# Patient Record
Sex: Male | Born: 1935 | Race: White | Hispanic: No | Marital: Married | Smoking: Former smoker
Health system: Southern US, Community
[De-identification: ages and names within clinical notes are randomized; demographics above are authoritative.]

## PROBLEM LIST (undated history)

## (undated) DIAGNOSIS — H539 Unspecified visual disturbance: Secondary | ICD-10-CM

## (undated) DIAGNOSIS — D75839 Thrombocytosis, unspecified: Secondary | ICD-10-CM

## (undated) DIAGNOSIS — D473 Essential (hemorrhagic) thrombocythemia: Secondary | ICD-10-CM

## (undated) DIAGNOSIS — E785 Hyperlipidemia, unspecified: Secondary | ICD-10-CM

## (undated) DIAGNOSIS — G629 Polyneuropathy, unspecified: Secondary | ICD-10-CM

## (undated) DIAGNOSIS — E559 Vitamin D deficiency, unspecified: Secondary | ICD-10-CM

## (undated) DIAGNOSIS — R7303 Prediabetes: Secondary | ICD-10-CM

## (undated) DIAGNOSIS — K219 Gastro-esophageal reflux disease without esophagitis: Secondary | ICD-10-CM

## (undated) DIAGNOSIS — E079 Disorder of thyroid, unspecified: Secondary | ICD-10-CM

## (undated) DIAGNOSIS — R55 Syncope and collapse: Secondary | ICD-10-CM

## (undated) DIAGNOSIS — I10 Essential (primary) hypertension: Secondary | ICD-10-CM

## (undated) HISTORY — DX: Gastro-esophageal reflux disease without esophagitis: K21.9

## (undated) HISTORY — DX: Essential (primary) hypertension: I10

## (undated) HISTORY — DX: Thrombocytosis, unspecified: D75.839

## (undated) HISTORY — DX: Disorder of thyroid, unspecified: E07.9

## (undated) HISTORY — PX: NO PAST SURGERIES: SHX2092

## (undated) HISTORY — DX: Prediabetes: R73.03

## (undated) HISTORY — DX: Syncope and collapse: R55

## (undated) HISTORY — DX: Hyperlipidemia, unspecified: E78.5

## (undated) HISTORY — DX: Unspecified visual disturbance: H53.9

## (undated) HISTORY — DX: Vitamin D deficiency, unspecified: E55.9

## (undated) HISTORY — DX: Essential (hemorrhagic) thrombocythemia: D47.3

## (undated) HISTORY — DX: Polyneuropathy, unspecified: G62.9

---

## 1998-04-14 ENCOUNTER — Other Ambulatory Visit: Admission: RE | Admit: 1998-04-14 | Discharge: 1998-04-14 | Payer: Self-pay | Admitting: Internal Medicine

## 2002-09-07 ENCOUNTER — Encounter (INDEPENDENT_AMBULATORY_CARE_PROVIDER_SITE_OTHER): Payer: Self-pay | Admitting: *Deleted

## 2002-09-07 ENCOUNTER — Ambulatory Visit (HOSPITAL_BASED_OUTPATIENT_CLINIC_OR_DEPARTMENT_OTHER): Admission: RE | Admit: 2002-09-07 | Discharge: 2002-09-07 | Payer: Self-pay | Admitting: General Surgery

## 2004-08-10 ENCOUNTER — Ambulatory Visit: Payer: Self-pay | Admitting: Oncology

## 2004-10-05 ENCOUNTER — Ambulatory Visit: Payer: Self-pay | Admitting: Oncology

## 2004-12-06 ENCOUNTER — Ambulatory Visit: Payer: Self-pay | Admitting: Oncology

## 2005-01-31 ENCOUNTER — Ambulatory Visit: Payer: Self-pay | Admitting: Oncology

## 2005-04-04 ENCOUNTER — Ambulatory Visit: Payer: Self-pay | Admitting: Oncology

## 2005-06-06 ENCOUNTER — Ambulatory Visit: Payer: Self-pay | Admitting: Oncology

## 2005-08-06 ENCOUNTER — Ambulatory Visit: Payer: Self-pay | Admitting: Oncology

## 2005-10-04 ENCOUNTER — Ambulatory Visit: Payer: Self-pay | Admitting: Oncology

## 2005-11-22 ENCOUNTER — Ambulatory Visit: Payer: Self-pay | Admitting: Oncology

## 2006-01-17 ENCOUNTER — Ambulatory Visit: Payer: Self-pay | Admitting: Oncology

## 2006-01-20 LAB — CBC WITH DIFFERENTIAL/PLATELET
Basophils Absolute: 0 10*3/uL (ref 0.0–0.1)
Eosinophils Absolute: 0 10*3/uL (ref 0.0–0.5)
HCT: 33.5 % — ABNORMAL LOW (ref 38.7–49.9)
HGB: 11.4 g/dL — ABNORMAL LOW (ref 13.0–17.1)
MCH: 38.2 pg — ABNORMAL HIGH (ref 28.0–33.4)
MCV: 112.3 fL — ABNORMAL HIGH (ref 81.6–98.0)
MONO%: 9.1 % (ref 0.0–13.0)
NEUT#: 3.2 10*3/uL (ref 1.5–6.5)
NEUT%: 71.6 % (ref 40.0–75.0)
RDW: 24.3 % — ABNORMAL HIGH (ref 11.2–14.6)
lymph#: 0.8 10*3/uL — ABNORMAL LOW (ref 0.9–3.3)

## 2006-03-18 ENCOUNTER — Ambulatory Visit: Payer: Self-pay | Admitting: Oncology

## 2006-03-24 LAB — CBC WITH DIFFERENTIAL/PLATELET
Basophils Absolute: 0 10*3/uL (ref 0.0–0.1)
EOS%: 0.9 % (ref 0.0–7.0)
MCHC: 33.9 g/dL (ref 32.0–35.9)
MCV: 112.3 fL — ABNORMAL HIGH (ref 81.6–98.0)
MONO#: 0.4 10*3/uL (ref 0.1–0.9)
NEUT%: 72.8 % (ref 40.0–75.0)
Platelets: 408 10*3/uL — ABNORMAL HIGH (ref 145–400)
RBC: 3.07 10*6/uL — ABNORMAL LOW (ref 4.20–5.71)
RDW: 24.5 % — ABNORMAL HIGH (ref 11.2–14.6)
lymph#: 0.8 10*3/uL — ABNORMAL LOW (ref 0.9–3.3)

## 2006-05-27 ENCOUNTER — Ambulatory Visit: Payer: Self-pay | Admitting: Oncology

## 2006-05-29 LAB — COMPREHENSIVE METABOLIC PANEL
ALT: 16 U/L (ref 0–40)
AST: 17 U/L (ref 0–37)
Alkaline Phosphatase: 46 U/L (ref 39–117)
Sodium: 138 mEq/L (ref 135–145)
Total Bilirubin: 0.8 mg/dL (ref 0.3–1.2)
Total Protein: 6.8 g/dL (ref 6.0–8.3)

## 2006-05-29 LAB — CBC WITH DIFFERENTIAL/PLATELET
BASO%: 0.4 % (ref 0.0–2.0)
Eosinophils Absolute: 0 10*3/uL (ref 0.0–0.5)
HCT: 33.6 % — ABNORMAL LOW (ref 38.7–49.9)
MCHC: 33.8 g/dL (ref 32.0–35.9)
MONO#: 0.4 10*3/uL (ref 0.1–0.9)
NEUT#: 3.5 10*3/uL (ref 1.5–6.5)
NEUT%: 74.8 % (ref 40.0–75.0)
WBC: 4.7 10*3/uL (ref 4.0–10.0)
lymph#: 0.7 10*3/uL — ABNORMAL LOW (ref 0.9–3.3)

## 2006-09-09 ENCOUNTER — Ambulatory Visit: Payer: Self-pay | Admitting: Oncology

## 2006-09-11 LAB — CBC WITH DIFFERENTIAL/PLATELET
BASO%: 0.2 % (ref 0.0–2.0)
Eosinophils Absolute: 0 10*3/uL (ref 0.0–0.5)
LYMPH%: 13 % — ABNORMAL LOW (ref 14.0–48.0)
MCHC: 33.6 g/dL (ref 32.0–35.9)
MONO#: 0.3 10*3/uL (ref 0.1–0.9)
NEUT#: 3.9 10*3/uL (ref 1.5–6.5)
Platelets: 447 10*3/uL — ABNORMAL HIGH (ref 145–400)
RBC: 3.01 10*6/uL — ABNORMAL LOW (ref 4.20–5.71)
RDW: 25.6 % — ABNORMAL HIGH (ref 11.2–14.6)
WBC: 4.9 10*3/uL (ref 4.0–10.0)
lymph#: 0.6 10*3/uL — ABNORMAL LOW (ref 0.9–3.3)

## 2006-12-08 ENCOUNTER — Ambulatory Visit: Payer: Self-pay | Admitting: Oncology

## 2006-12-11 LAB — COMPREHENSIVE METABOLIC PANEL
Albumin: 4.1 g/dL (ref 3.5–5.2)
Alkaline Phosphatase: 49 U/L (ref 39–117)
BUN: 24 mg/dL — ABNORMAL HIGH (ref 6–23)
Creatinine, Ser: 1.22 mg/dL (ref 0.40–1.50)
Glucose, Bld: 92 mg/dL (ref 70–99)
Total Bilirubin: 0.7 mg/dL (ref 0.3–1.2)

## 2006-12-11 LAB — CBC WITH DIFFERENTIAL/PLATELET
Basophils Absolute: 0 10*3/uL (ref 0.0–0.1)
EOS%: 0.8 % (ref 0.0–7.0)
Eosinophils Absolute: 0 10*3/uL (ref 0.0–0.5)
HGB: 11.4 g/dL — ABNORMAL LOW (ref 13.0–17.1)
LYMPH%: 16.6 % (ref 14.0–48.0)
MCH: 37.6 pg — ABNORMAL HIGH (ref 28.0–33.4)
MCV: 109.3 fL — ABNORMAL HIGH (ref 81.6–98.0)
MONO%: 7.1 % (ref 0.0–13.0)
NEUT#: 3.8 10*3/uL (ref 1.5–6.5)
Platelets: 447 10*3/uL — ABNORMAL HIGH (ref 145–400)

## 2007-03-10 ENCOUNTER — Ambulatory Visit: Payer: Self-pay | Admitting: Oncology

## 2007-03-12 LAB — CBC WITH DIFFERENTIAL/PLATELET
BASO%: 0.2 % (ref 0.0–2.0)
Eosinophils Absolute: 0 10*3/uL (ref 0.0–0.5)
HCT: 34.2 % — ABNORMAL LOW (ref 38.7–49.9)
HGB: 11.6 g/dL — ABNORMAL LOW (ref 13.0–17.1)
MCHC: 33.8 g/dL (ref 32.0–35.9)
MONO#: 0.3 10*3/uL (ref 0.1–0.9)
NEUT#: 3.9 10*3/uL (ref 1.5–6.5)
NEUT%: 80.6 % — ABNORMAL HIGH (ref 40.0–75.0)
WBC: 4.8 10*3/uL (ref 4.0–10.0)
lymph#: 0.5 10*3/uL — ABNORMAL LOW (ref 0.9–3.3)

## 2007-06-09 ENCOUNTER — Ambulatory Visit: Payer: Self-pay | Admitting: Oncology

## 2007-09-08 ENCOUNTER — Ambulatory Visit: Payer: Self-pay | Admitting: Oncology

## 2007-09-10 LAB — CBC WITH DIFFERENTIAL/PLATELET
Basophils Absolute: 0 10*3/uL (ref 0.0–0.1)
HCT: 34.2 % — ABNORMAL LOW (ref 38.7–49.9)
HGB: 11.4 g/dL — ABNORMAL LOW (ref 13.0–17.1)
MCH: 36.7 pg — ABNORMAL HIGH (ref 28.0–33.4)
MONO#: 0.4 10*3/uL (ref 0.1–0.9)
NEUT%: 83.6 % — ABNORMAL HIGH (ref 40.0–75.0)
lymph#: 0.8 10*3/uL — ABNORMAL LOW (ref 0.9–3.3)

## 2007-09-25 LAB — CBC WITH DIFFERENTIAL/PLATELET
Basophils Absolute: 0 10*3/uL (ref 0.0–0.1)
EOS%: 1 % (ref 0.0–7.0)
HCT: 31.8 % — ABNORMAL LOW (ref 38.7–49.9)
HGB: 10.9 g/dL — ABNORMAL LOW (ref 13.0–17.1)
MCH: 37.2 pg — ABNORMAL HIGH (ref 28.0–33.4)
MCV: 108.6 fL — ABNORMAL HIGH (ref 81.6–98.0)
MONO%: 5.8 % (ref 0.0–13.0)
NEUT%: 78 % — ABNORMAL HIGH (ref 40.0–75.0)

## 2007-12-11 ENCOUNTER — Ambulatory Visit: Payer: Self-pay | Admitting: Oncology

## 2007-12-15 LAB — FERRITIN: Ferritin: 35 ng/mL (ref 22–322)

## 2008-02-11 ENCOUNTER — Ambulatory Visit: Payer: Self-pay | Admitting: Oncology

## 2008-04-13 ENCOUNTER — Ambulatory Visit: Payer: Self-pay | Admitting: Oncology

## 2008-04-15 LAB — CBC WITH DIFFERENTIAL/PLATELET
BASO%: 0.3 % (ref 0.0–2.0)
EOS%: 0.9 % (ref 0.0–7.0)
HGB: 11.3 g/dL — ABNORMAL LOW (ref 13.0–17.1)
MCH: 36.7 pg — ABNORMAL HIGH (ref 28.0–33.4)
MCHC: 33.4 g/dL (ref 32.0–35.9)
MCV: 110 fL — ABNORMAL HIGH (ref 81.6–98.0)
MONO%: 7.1 % (ref 0.0–13.0)
RBC: 3.08 10*6/uL — ABNORMAL LOW (ref 4.20–5.71)
RDW: 26.7 % — ABNORMAL HIGH (ref 11.2–14.6)
lymph#: 0.8 10*3/uL — ABNORMAL LOW (ref 0.9–3.3)

## 2008-06-14 ENCOUNTER — Ambulatory Visit: Payer: Self-pay | Admitting: Oncology

## 2008-06-16 LAB — CBC WITH DIFFERENTIAL/PLATELET
BASO%: 0.5 % (ref 0.0–2.0)
Basophils Absolute: 0 10*3/uL (ref 0.0–0.1)
EOS%: 0.9 % (ref 0.0–7.0)
HCT: 34.8 % — ABNORMAL LOW (ref 38.7–49.9)
HGB: 11.8 g/dL — ABNORMAL LOW (ref 13.0–17.1)
MCH: 38 pg — ABNORMAL HIGH (ref 28.0–33.4)
MCHC: 33.7 g/dL (ref 32.0–35.9)
MCV: 112.9 fL — ABNORMAL HIGH (ref 81.6–98.0)
MONO%: 8.1 % (ref 0.0–13.0)
NEUT%: 74.3 % (ref 40.0–75.0)
lymph#: 0.7 10*3/uL — ABNORMAL LOW (ref 0.9–3.3)

## 2008-08-09 ENCOUNTER — Ambulatory Visit: Payer: Self-pay | Admitting: Oncology

## 2008-08-11 LAB — CBC WITH DIFFERENTIAL/PLATELET
EOS%: 0.7 % (ref 0.0–7.0)
Eosinophils Absolute: 0 10*3/uL (ref 0.0–0.5)
MCV: 113.6 fL — ABNORMAL HIGH (ref 81.6–98.0)
MONO%: 5.7 % (ref 0.0–13.0)
NEUT#: 4 10*3/uL (ref 1.5–6.5)
RBC: 2.75 10*6/uL — ABNORMAL LOW (ref 4.20–5.71)
RDW: 27.8 % — ABNORMAL HIGH (ref 11.2–14.6)
WBC: 4.9 10*3/uL (ref 4.0–10.0)
lymph#: 0.6 10*3/uL — ABNORMAL LOW (ref 0.9–3.3)

## 2008-08-11 LAB — COMPREHENSIVE METABOLIC PANEL
ALT: 19 U/L (ref 0–53)
AST: 16 U/L (ref 0–37)
Albumin: 4.4 g/dL (ref 3.5–5.2)
Alkaline Phosphatase: 52 U/L (ref 39–117)
Calcium: 8.6 mg/dL (ref 8.4–10.5)
Chloride: 103 mEq/L (ref 96–112)
Creatinine, Ser: 1.19 mg/dL (ref 0.40–1.50)
Potassium: 4.5 mEq/L (ref 3.5–5.3)

## 2008-09-20 ENCOUNTER — Ambulatory Visit: Payer: Self-pay | Admitting: Oncology

## 2008-09-22 LAB — CBC WITH DIFFERENTIAL/PLATELET
BASO%: 0.4 % (ref 0.0–2.0)
Basophils Absolute: 0 10*3/uL (ref 0.0–0.1)
EOS%: 0.8 % (ref 0.0–7.0)
MCH: 39.3 pg — ABNORMAL HIGH (ref 28.0–33.4)
MCHC: 34 g/dL (ref 32.0–35.9)
MCV: 115.7 fL — ABNORMAL HIGH (ref 81.6–98.0)
MONO%: 6.6 % (ref 0.0–13.0)
RBC: 2.38 10*6/uL — ABNORMAL LOW (ref 4.20–5.71)
RDW: 28.7 % — ABNORMAL HIGH (ref 11.2–14.6)

## 2008-11-29 ENCOUNTER — Ambulatory Visit: Payer: Self-pay | Admitting: Oncology

## 2008-12-01 LAB — CBC WITH DIFFERENTIAL/PLATELET
EOS%: 0.9 % (ref 0.0–7.0)
HGB: 11.9 g/dL — ABNORMAL LOW (ref 13.0–17.1)
MCH: 37.1 pg — ABNORMAL HIGH (ref 27.2–33.4)
MCV: 111.5 fL — ABNORMAL HIGH (ref 79.3–98.0)
MONO%: 8.1 % (ref 0.0–14.0)
NEUT#: 4 10*3/uL (ref 1.5–6.5)
RBC: 3.22 10*6/uL — ABNORMAL LOW (ref 4.20–5.82)
RDW: 25.2 % — ABNORMAL HIGH (ref 11.0–14.6)
lymph#: 0.7 10*3/uL — ABNORMAL LOW (ref 0.9–3.3)

## 2008-12-01 LAB — COMPREHENSIVE METABOLIC PANEL
ALT: 15 U/L (ref 0–53)
AST: 16 U/L (ref 0–37)
Albumin: 4.3 g/dL (ref 3.5–5.2)
Alkaline Phosphatase: 55 U/L (ref 39–117)
Calcium: 8.7 mg/dL (ref 8.4–10.5)
Chloride: 104 mEq/L (ref 96–112)
Potassium: 4.2 mEq/L (ref 3.5–5.3)
Sodium: 141 mEq/L (ref 135–145)
Total Protein: 6.4 g/dL (ref 6.0–8.3)

## 2009-01-27 ENCOUNTER — Ambulatory Visit: Payer: Self-pay | Admitting: Oncology

## 2009-02-01 LAB — CBC WITH DIFFERENTIAL/PLATELET
BASO%: 0.3 % (ref 0.0–2.0)
Basophils Absolute: 0 10*3/uL (ref 0.0–0.1)
EOS%: 0.8 % (ref 0.0–7.0)
HGB: 12.7 g/dL — ABNORMAL LOW (ref 13.0–17.1)
MCH: 35.4 pg — ABNORMAL HIGH (ref 27.2–33.4)
MCHC: 33.5 g/dL (ref 32.0–36.0)
MCV: 105.6 fL — ABNORMAL HIGH (ref 79.3–98.0)
MONO%: 7.5 % (ref 0.0–14.0)
NEUT%: 79.9 % — ABNORMAL HIGH (ref 39.0–75.0)
RDW: 25.3 % — ABNORMAL HIGH (ref 11.0–14.6)
lymph#: 0.9 10*3/uL (ref 0.9–3.3)

## 2009-03-28 ENCOUNTER — Ambulatory Visit: Payer: Self-pay | Admitting: Oncology

## 2009-03-30 LAB — CBC WITH DIFFERENTIAL/PLATELET
BASO%: 0.4 % (ref 0.0–2.0)
Eosinophils Absolute: 0.1 10*3/uL (ref 0.0–0.5)
LYMPH%: 12.5 % — ABNORMAL LOW (ref 14.0–49.0)
MCH: 35.6 pg — ABNORMAL HIGH (ref 27.2–33.4)
MCHC: 33.6 g/dL (ref 32.0–36.0)
MCV: 105.9 fL — ABNORMAL HIGH (ref 79.3–98.0)
MONO%: 7.2 % (ref 0.0–14.0)
Platelets: 701 10*3/uL — ABNORMAL HIGH (ref 140–400)
RBC: 3.62 10*6/uL — ABNORMAL LOW (ref 4.20–5.82)

## 2009-04-27 ENCOUNTER — Ambulatory Visit: Payer: Self-pay | Admitting: Oncology

## 2009-05-01 LAB — CBC WITH DIFFERENTIAL/PLATELET
Basophils Absolute: 0 10*3/uL (ref 0.0–0.1)
Eosinophils Absolute: 0.1 10*3/uL (ref 0.0–0.5)
HGB: 11.9 g/dL — ABNORMAL LOW (ref 13.0–17.1)
LYMPH%: 12.3 % — ABNORMAL LOW (ref 14.0–49.0)
MCV: 106.7 fL — ABNORMAL HIGH (ref 79.3–98.0)
MONO%: 7.4 % (ref 0.0–14.0)
NEUT#: 4.7 10*3/uL (ref 1.5–6.5)
NEUT%: 78.7 % — ABNORMAL HIGH (ref 39.0–75.0)
Platelets: 609 10*3/uL — ABNORMAL HIGH (ref 140–400)

## 2009-05-25 LAB — CBC WITH DIFFERENTIAL/PLATELET
Basophils Absolute: 0 10*3/uL (ref 0.0–0.1)
Eosinophils Absolute: 0.1 10*3/uL (ref 0.0–0.5)
HCT: 35.6 % — ABNORMAL LOW (ref 38.4–49.9)
HGB: 12.1 g/dL — ABNORMAL LOW (ref 13.0–17.1)
MCV: 106.5 fL — ABNORMAL HIGH (ref 79.3–98.0)
MONO%: 7.9 % (ref 0.0–14.0)
NEUT#: 4.1 10*3/uL (ref 1.5–6.5)
NEUT%: 78.5 % — ABNORMAL HIGH (ref 39.0–75.0)
RDW: 27 % — ABNORMAL HIGH (ref 11.0–14.6)
lymph#: 0.7 10*3/uL — ABNORMAL LOW (ref 0.9–3.3)

## 2009-08-28 ENCOUNTER — Ambulatory Visit: Payer: Self-pay | Admitting: Oncology

## 2009-08-30 LAB — CBC WITH DIFFERENTIAL/PLATELET
BASO%: 0.5 % (ref 0.0–2.0)
Eosinophils Absolute: 0.1 10*3/uL (ref 0.0–0.5)
HCT: 36.5 % — ABNORMAL LOW (ref 38.4–49.9)
HGB: 12 g/dL — ABNORMAL LOW (ref 13.0–17.1)
MCHC: 32.9 g/dL (ref 32.0–36.0)
MONO#: 0.7 10*3/uL (ref 0.1–0.9)
NEUT#: 6.3 10*3/uL (ref 1.5–6.5)
NEUT%: 75.2 % — ABNORMAL HIGH (ref 39.0–75.0)
WBC: 8.4 10*3/uL (ref 4.0–10.3)
lymph#: 1.3 10*3/uL (ref 0.9–3.3)

## 2009-11-17 ENCOUNTER — Ambulatory Visit: Payer: Self-pay | Admitting: Oncology

## 2009-11-21 LAB — COMPREHENSIVE METABOLIC PANEL
ALT: 17 U/L (ref 0–53)
AST: 15 U/L (ref 0–37)
Albumin: 4.2 g/dL (ref 3.5–5.2)
Alkaline Phosphatase: 50 U/L (ref 39–117)
BUN: 18 mg/dL (ref 6–23)
CO2: 26 mEq/L (ref 19–32)
Calcium: 8.7 mg/dL (ref 8.4–10.5)
Chloride: 106 mEq/L (ref 96–112)
Creatinine, Ser: 1.14 mg/dL (ref 0.40–1.50)
Glucose, Bld: 104 mg/dL — ABNORMAL HIGH (ref 70–99)
Potassium: 4.9 mEq/L (ref 3.5–5.3)
Sodium: 140 mEq/L (ref 135–145)
Total Bilirubin: 0.5 mg/dL (ref 0.3–1.2)
Total Protein: 6.7 g/dL (ref 6.0–8.3)

## 2009-11-21 LAB — CBC WITH DIFFERENTIAL/PLATELET
BASO%: 0.7 % (ref 0.0–2.0)
Basophils Absolute: 0 10*3/uL (ref 0.0–0.1)
EOS%: 1.4 % (ref 0.0–7.0)
Eosinophils Absolute: 0.1 10*3/uL (ref 0.0–0.5)
HCT: 37.2 % — ABNORMAL LOW (ref 38.4–49.9)
HGB: 12.3 g/dL — ABNORMAL LOW (ref 13.0–17.1)
LYMPH%: 14.6 % (ref 14.0–49.0)
MCH: 35.9 pg — ABNORMAL HIGH (ref 27.2–33.4)
MCHC: 33 g/dL (ref 32.0–36.0)
MCV: 108.7 fL — ABNORMAL HIGH (ref 79.3–98.0)
MONO#: 0.5 10*3/uL (ref 0.1–0.9)
MONO%: 7.5 % (ref 0.0–14.0)
NEUT#: 4.6 10*3/uL (ref 1.5–6.5)
NEUT%: 75.8 % — ABNORMAL HIGH (ref 39.0–75.0)
Platelets: 651 10*3/uL — ABNORMAL HIGH (ref 140–400)
RBC: 3.42 10*6/uL — ABNORMAL LOW (ref 4.20–5.82)
RDW: 25.4 % — ABNORMAL HIGH (ref 11.0–14.6)
WBC: 6 10*3/uL (ref 4.0–10.3)
lymph#: 0.9 10*3/uL (ref 0.9–3.3)

## 2010-02-20 ENCOUNTER — Ambulatory Visit: Payer: Self-pay | Admitting: Oncology

## 2010-02-23 LAB — CBC WITH DIFFERENTIAL/PLATELET
BASO%: 0.4 % (ref 0.0–2.0)
Basophils Absolute: 0 10*3/uL (ref 0.0–0.1)
EOS%: 0.8 % (ref 0.0–7.0)
Eosinophils Absolute: 0.1 10*3/uL (ref 0.0–0.5)
HCT: 35.5 % — ABNORMAL LOW (ref 38.4–49.9)
HGB: 11.9 g/dL — ABNORMAL LOW (ref 13.0–17.1)
LYMPH%: 14.4 % (ref 14.0–49.0)
MCH: 36 pg — ABNORMAL HIGH (ref 27.2–33.4)
MCHC: 33.6 g/dL (ref 32.0–36.0)
MCV: 107.2 fL — ABNORMAL HIGH (ref 79.3–98.0)
MONO#: 0.5 10*3/uL (ref 0.1–0.9)
MONO%: 7.9 % (ref 0.0–14.0)
NEUT#: 4.9 10*3/uL (ref 1.5–6.5)
NEUT%: 76.5 % — ABNORMAL HIGH (ref 39.0–75.0)
Platelets: 546 10*3/uL — ABNORMAL HIGH (ref 140–400)
RBC: 3.31 10*6/uL — ABNORMAL LOW (ref 4.20–5.82)
RDW: 25.3 % — ABNORMAL HIGH (ref 11.0–14.6)
WBC: 6.4 10*3/uL (ref 4.0–10.3)
lymph#: 0.9 10*3/uL (ref 0.9–3.3)

## 2010-05-22 ENCOUNTER — Ambulatory Visit: Payer: Self-pay | Admitting: Oncology

## 2010-05-24 LAB — CBC WITH DIFFERENTIAL/PLATELET
BASO%: 0.4 % (ref 0.0–2.0)
Basophils Absolute: 0 10*3/uL (ref 0.0–0.1)
EOS%: 0.9 % (ref 0.0–7.0)
Eosinophils Absolute: 0 10*3/uL (ref 0.0–0.5)
HCT: 36.2 % — ABNORMAL LOW (ref 38.4–49.9)
HGB: 11.7 g/dL — ABNORMAL LOW (ref 13.0–17.1)
LYMPH%: 10.3 % — ABNORMAL LOW (ref 14.0–49.0)
MCH: 35 pg — ABNORMAL HIGH (ref 27.2–33.4)
MCHC: 32.4 g/dL (ref 32.0–36.0)
MCV: 108 fL — ABNORMAL HIGH (ref 79.3–98.0)
MONO#: 0.4 10*3/uL (ref 0.1–0.9)
MONO%: 6.6 % (ref 0.0–14.0)
NEUT#: 4.6 10*3/uL (ref 1.5–6.5)
NEUT%: 81.8 % — ABNORMAL HIGH (ref 39.0–75.0)
Platelets: 626 10*3/uL — ABNORMAL HIGH (ref 140–400)
RBC: 3.35 10*6/uL — ABNORMAL LOW (ref 4.20–5.82)
RDW: 27.1 % — ABNORMAL HIGH (ref 11.0–14.6)
WBC: 5.7 10*3/uL (ref 4.0–10.3)
lymph#: 0.6 10*3/uL — ABNORMAL LOW (ref 0.9–3.3)

## 2010-08-21 ENCOUNTER — Ambulatory Visit: Payer: Self-pay | Admitting: Oncology

## 2010-08-23 LAB — BASIC METABOLIC PANEL
BUN: 17 mg/dL (ref 6–23)
CO2: 26 mEq/L (ref 19–32)
Calcium: 8.8 mg/dL (ref 8.4–10.5)
Chloride: 104 mEq/L (ref 96–112)
Creatinine, Ser: 1.07 mg/dL (ref 0.40–1.50)
Glucose, Bld: 87 mg/dL (ref 70–99)
Potassium: 4.7 mEq/L (ref 3.5–5.3)
Sodium: 137 mEq/L (ref 135–145)

## 2010-08-23 LAB — CBC WITH DIFFERENTIAL/PLATELET
BASO%: 0.3 % (ref 0.0–2.0)
Basophils Absolute: 0 10*3/uL (ref 0.0–0.1)
EOS%: 1.4 % (ref 0.0–7.0)
Eosinophils Absolute: 0.1 10*3/uL (ref 0.0–0.5)
HCT: 34.2 % — ABNORMAL LOW (ref 38.4–49.9)
HGB: 11.6 g/dL — ABNORMAL LOW (ref 13.0–17.1)
LYMPH%: 12.6 % — ABNORMAL LOW (ref 14.0–49.0)
MCH: 35.7 pg — ABNORMAL HIGH (ref 27.2–33.4)
MCHC: 33.8 g/dL (ref 32.0–36.0)
MCV: 105.7 fL — ABNORMAL HIGH (ref 79.3–98.0)
MONO#: 0.5 10*3/uL (ref 0.1–0.9)
MONO%: 7.2 % (ref 0.0–14.0)
NEUT#: 5.1 10*3/uL (ref 1.5–6.5)
NEUT%: 78.5 % — ABNORMAL HIGH (ref 39.0–75.0)
Platelets: 708 10*3/uL — ABNORMAL HIGH (ref 140–400)
RBC: 3.24 10*6/uL — ABNORMAL LOW (ref 4.20–5.82)
RDW: 27.4 % — ABNORMAL HIGH (ref 11.0–14.6)
WBC: 6.5 10*3/uL (ref 4.0–10.3)
lymph#: 0.8 10*3/uL — ABNORMAL LOW (ref 0.9–3.3)

## 2010-10-02 ENCOUNTER — Ambulatory Visit: Payer: Self-pay | Admitting: Oncology

## 2010-10-04 LAB — CBC WITH DIFFERENTIAL/PLATELET
BASO%: 0.4 % (ref 0.0–2.0)
Basophils Absolute: 0 10*3/uL (ref 0.0–0.1)
EOS%: 1 % (ref 0.0–7.0)
Eosinophils Absolute: 0 10*3/uL (ref 0.0–0.5)
HCT: 35.4 % — ABNORMAL LOW (ref 38.4–49.9)
HGB: 11.7 g/dL — ABNORMAL LOW (ref 13.0–17.1)
LYMPH%: 9.5 % — ABNORMAL LOW (ref 14.0–49.0)
MCH: 35.3 pg — ABNORMAL HIGH (ref 27.2–33.4)
MCHC: 33.2 g/dL (ref 32.0–36.0)
MCV: 106.6 fL — ABNORMAL HIGH (ref 79.3–98.0)
MONO#: 0.4 10*3/uL (ref 0.1–0.9)
MONO%: 7.2 % (ref 0.0–14.0)
NEUT#: 4.1 10*3/uL (ref 1.5–6.5)
NEUT%: 81.9 % — ABNORMAL HIGH (ref 39.0–75.0)
Platelets: 556 10*3/uL — ABNORMAL HIGH (ref 140–400)
RBC: 3.32 10*6/uL — ABNORMAL LOW (ref 4.20–5.82)
RDW: 27.5 % — ABNORMAL HIGH (ref 11.0–14.6)
WBC: 5 10*3/uL (ref 4.0–10.3)
lymph#: 0.5 10*3/uL — ABNORMAL LOW (ref 0.9–3.3)

## 2010-11-22 ENCOUNTER — Encounter (HOSPITAL_BASED_OUTPATIENT_CLINIC_OR_DEPARTMENT_OTHER): Payer: Medicare Other | Admitting: Oncology

## 2010-11-22 DIAGNOSIS — D473 Essential (hemorrhagic) thrombocythemia: Secondary | ICD-10-CM

## 2010-11-22 DIAGNOSIS — D539 Nutritional anemia, unspecified: Secondary | ICD-10-CM

## 2011-02-08 NOTE — Op Note (Signed)
NAME:  Jack Schultz, Jack Schultz                       ACCOUNT NO.:  [id number]   MEDICAL RECORD NO.:  [id number]                   PATIENT TYPE:  AMB   LOCATION:  [facility]SC                                  FACILITY:  [facility]   PHYSICIAN:  Adolph Pollack, M.D.            DATE OF BIRTH:  [birth year]   DATE OF PROCEDURE:  09/07/2002  DATE OF DISCHARGE:                                 OPERATIVE REPORT   PREOPERATIVE DIAGNOSIS:  Cystic soft tissue mass, left shoulder measuring 4  cm.   POSTOPERATIVE DIAGNOSIS:  Cystic soft tissue mass, left shoulder measuring 4  cm.   OPERATION PERFORMED:  Excision of cystic soft tissue mass, left shoulder.   SURGEON:  Adolph Pollack, M.D.   ANESTHESIA:  1% plain lidocaine.   INDICATIONS FOR PROCEDURE:  The patient is a 77 year old male who has noted  a slowly growing lesion in the left shoulder area.  He has not had pain from  it until recently.  He now presents for excision.  The procedure and the  risks were explained to him including but not limited to bleeding, infection  or nerve damage.   DESCRIPTION OF PROCEDURE:  The patient was brought to the minor procedure  room, placed supine on the operating table.  The left shoulder mass was  marked and sterilely prepped and draped.  Local anesthetic was infiltrated  directly over it and a transverse incision made directly over the mass and  carried down through the subcutaneous tissue.  The mass appeared to be  cystic.  Using gentle blunt and sharp dissection, I was able to isolate most  of the mass but it appeared to be fairly adherent to bone, potentially like  a slightly inflamed bursa.  I opened up the cystic lesion and evacuated some  of the fluid and excised most of the cyst but leaving the posterior wall on  the periosteum.  I then sent this to pathology.   We had some oozing as he had been on aspirin but off for five days but still  appeared to be having a little bit more bleeding than normal.  I  controlled  this with interrupted absorbable sutures and slight cautery.  Once the  hemostasis was adequate, I then closed the wound with running 3-0 Monocryl  subcuticular stitch.  Steri-Strips and sterile dressings were applied.   The patient tolerated the procedure well without any complications.  He was  given discharge instructions and was sent home in satisfactory condition.  He was given Darvocet for pain.  I will see him back in the office in two or  three weeks for follow-up.                                               Adolph Pollack, M.D.  TJR/MEDQ  D:  09/07/2002  T:  09/07/2002  Job:  [id number]

## 2011-02-22 ENCOUNTER — Other Ambulatory Visit: Payer: Self-pay | Admitting: Oncology

## 2011-02-22 ENCOUNTER — Encounter (HOSPITAL_BASED_OUTPATIENT_CLINIC_OR_DEPARTMENT_OTHER): Payer: Medicare Other | Admitting: Oncology

## 2011-02-22 DIAGNOSIS — D473 Essential (hemorrhagic) thrombocythemia: Secondary | ICD-10-CM

## 2011-02-22 DIAGNOSIS — R252 Cramp and spasm: Secondary | ICD-10-CM

## 2011-02-22 DIAGNOSIS — D539 Nutritional anemia, unspecified: Secondary | ICD-10-CM

## 2011-02-22 LAB — CBC WITH DIFFERENTIAL/PLATELET
BASO%: 0.3 % (ref 0.0–2.0)
Basophils Absolute: 0 10*3/uL (ref 0.0–0.1)
EOS%: 1 % (ref 0.0–7.0)
Eosinophils Absolute: 0.1 10*3/uL (ref 0.0–0.5)
HCT: 34.5 % — ABNORMAL LOW (ref 38.4–49.9)
HGB: 11.5 g/dL — ABNORMAL LOW (ref 13.0–17.1)
LYMPH%: 14.2 % (ref 14.0–49.0)
MCH: 35.7 pg — ABNORMAL HIGH (ref 27.2–33.4)
MCHC: 33.4 g/dL (ref 32.0–36.0)
MCV: 106.8 fL — ABNORMAL HIGH (ref 79.3–98.0)
MONO#: 0.4 10*3/uL (ref 0.1–0.9)
MONO%: 6.3 % (ref 0.0–14.0)
NEUT#: 4.5 10*3/uL (ref 1.5–6.5)
NEUT%: 78.2 % — ABNORMAL HIGH (ref 39.0–75.0)
Platelets: 735 10*3/uL — ABNORMAL HIGH (ref 140–400)
RBC: 3.23 10*6/uL — ABNORMAL LOW (ref 4.20–5.82)
RDW: 27.6 % — ABNORMAL HIGH (ref 11.0–14.6)
WBC: 5.8 10*3/uL (ref 4.0–10.3)
lymph#: 0.8 10*3/uL — ABNORMAL LOW (ref 0.9–3.3)

## 2011-03-22 ENCOUNTER — Other Ambulatory Visit: Payer: Self-pay | Admitting: Oncology

## 2011-03-22 ENCOUNTER — Encounter (HOSPITAL_BASED_OUTPATIENT_CLINIC_OR_DEPARTMENT_OTHER): Payer: Medicare Other | Admitting: Oncology

## 2011-03-22 DIAGNOSIS — R252 Cramp and spasm: Secondary | ICD-10-CM

## 2011-03-22 DIAGNOSIS — D473 Essential (hemorrhagic) thrombocythemia: Secondary | ICD-10-CM

## 2011-03-22 DIAGNOSIS — D539 Nutritional anemia, unspecified: Secondary | ICD-10-CM

## 2011-03-22 LAB — CBC WITH DIFFERENTIAL/PLATELET
BASO%: 0.3 % (ref 0.0–2.0)
EOS%: 1.8 % (ref 0.0–7.0)
HCT: 35.9 % — ABNORMAL LOW (ref 38.4–49.9)
MCH: 34.4 pg — ABNORMAL HIGH (ref 27.2–33.4)
MCHC: 33.1 g/dL (ref 32.0–36.0)
MONO#: 0.5 10*3/uL (ref 0.1–0.9)
RDW: 23.9 % — ABNORMAL HIGH (ref 11.0–14.6)
WBC: 6.3 10*3/uL (ref 4.0–10.3)
lymph#: 0.8 10*3/uL — ABNORMAL LOW (ref 0.9–3.3)
nRBC: 0 % (ref 0–0)

## 2011-04-24 ENCOUNTER — Encounter (HOSPITAL_BASED_OUTPATIENT_CLINIC_OR_DEPARTMENT_OTHER): Payer: Medicare Other | Admitting: Oncology

## 2011-04-24 ENCOUNTER — Other Ambulatory Visit: Payer: Self-pay | Admitting: Oncology

## 2011-04-24 DIAGNOSIS — D539 Nutritional anemia, unspecified: Secondary | ICD-10-CM

## 2011-04-24 DIAGNOSIS — R252 Cramp and spasm: Secondary | ICD-10-CM

## 2011-04-24 DIAGNOSIS — D473 Essential (hemorrhagic) thrombocythemia: Secondary | ICD-10-CM

## 2011-04-24 LAB — CBC WITH DIFFERENTIAL/PLATELET
Basophils Absolute: 0 10*3/uL (ref 0.0–0.1)
Eosinophils Absolute: 0.1 10*3/uL (ref 0.0–0.5)
HGB: 11.5 g/dL — ABNORMAL LOW (ref 13.0–17.1)
MONO#: 0.5 10*3/uL (ref 0.1–0.9)
MONO%: 8.4 % (ref 0.0–14.0)
NEUT#: 4.9 10*3/uL (ref 1.5–6.5)
RBC: 3.38 10*6/uL — ABNORMAL LOW (ref 4.20–5.82)
RDW: 25.2 % — ABNORMAL HIGH (ref 11.0–14.6)
WBC: 6.5 10*3/uL (ref 4.0–10.3)
lymph#: 0.9 10*3/uL (ref 0.9–3.3)
nRBC: 1 % — ABNORMAL HIGH (ref 0–0)

## 2011-05-28 ENCOUNTER — Encounter (HOSPITAL_BASED_OUTPATIENT_CLINIC_OR_DEPARTMENT_OTHER): Payer: Medicare Other | Admitting: Oncology

## 2011-05-28 ENCOUNTER — Other Ambulatory Visit: Payer: Self-pay | Admitting: Oncology

## 2011-05-28 DIAGNOSIS — D539 Nutritional anemia, unspecified: Secondary | ICD-10-CM

## 2011-05-28 DIAGNOSIS — D473 Essential (hemorrhagic) thrombocythemia: Secondary | ICD-10-CM

## 2011-05-28 DIAGNOSIS — R252 Cramp and spasm: Secondary | ICD-10-CM

## 2011-05-28 LAB — CBC WITH DIFFERENTIAL/PLATELET
Basophils Absolute: 0 10*3/uL (ref 0.0–0.1)
EOS%: 1 % (ref 0.0–7.0)
MCH: 34.9 pg — ABNORMAL HIGH (ref 27.2–33.4)
MCV: 105.3 fL — ABNORMAL HIGH (ref 79.3–98.0)
MONO%: 8.3 % (ref 0.0–14.0)
RBC: 3.21 10*6/uL — ABNORMAL LOW (ref 4.20–5.82)
RDW: 24.8 % — ABNORMAL HIGH (ref 11.0–14.6)

## 2011-08-27 ENCOUNTER — Other Ambulatory Visit: Payer: Self-pay | Admitting: Oncology

## 2011-08-27 ENCOUNTER — Other Ambulatory Visit (HOSPITAL_BASED_OUTPATIENT_CLINIC_OR_DEPARTMENT_OTHER): Payer: Medicare Other | Admitting: Lab

## 2011-08-27 DIAGNOSIS — R252 Cramp and spasm: Secondary | ICD-10-CM

## 2011-08-27 DIAGNOSIS — D473 Essential (hemorrhagic) thrombocythemia: Secondary | ICD-10-CM

## 2011-08-27 DIAGNOSIS — D539 Nutritional anemia, unspecified: Secondary | ICD-10-CM

## 2011-08-27 LAB — CBC WITH DIFFERENTIAL/PLATELET
BASO%: 0.4 % (ref 0.0–2.0)
Eosinophils Absolute: 0.1 10*3/uL (ref 0.0–0.5)
MCHC: 33.1 g/dL (ref 32.0–36.0)
MCV: 107.3 fL — ABNORMAL HIGH (ref 79.3–98.0)
MONO%: 6.7 % (ref 0.0–14.0)
NEUT#: 4.1 10*3/uL (ref 1.5–6.5)
RBC: 3.15 10*6/uL — ABNORMAL LOW (ref 4.20–5.82)
RDW: 23.7 % — ABNORMAL HIGH (ref 11.0–14.6)
WBC: 5.2 10*3/uL (ref 4.0–10.3)
nRBC: 0 % (ref 0–0)

## 2011-08-29 ENCOUNTER — Telehealth: Payer: Self-pay | Admitting: *Deleted

## 2011-08-29 NOTE — Telephone Encounter (Signed)
CBC reviewed by Dr. Truett Perna. Pt currently taking Hydrea 1,000mg  MWF and 500mg  all other days. Continue same dose, will recheck CBC at next office visit 11/2010. Pt verbalized understanding.

## 2011-10-09 ENCOUNTER — Telehealth: Payer: Self-pay | Admitting: Oncology

## 2011-10-09 NOTE — Telephone Encounter (Signed)
Daughter is filling out an Event organiser and the question she has to answer is "Has the patient been treated for cancer in the last year?" She said Jack Schultz said what he has is not cancer but can turn into cancer.  If he does have cancer we need to call and tell him that.

## 2011-10-19 ENCOUNTER — Telehealth: Payer: Self-pay | Admitting: Oncology

## 2011-10-19 NOTE — Telephone Encounter (Signed)
pt aware of 12/06/11 appt   aom

## 2011-11-25 ENCOUNTER — Other Ambulatory Visit: Payer: Self-pay | Admitting: Oncology

## 2011-11-25 DIAGNOSIS — D473 Essential (hemorrhagic) thrombocythemia: Secondary | ICD-10-CM

## 2011-12-06 ENCOUNTER — Telehealth: Payer: Self-pay | Admitting: Oncology

## 2011-12-06 ENCOUNTER — Other Ambulatory Visit: Payer: Self-pay | Admitting: *Deleted

## 2011-12-06 ENCOUNTER — Ambulatory Visit (HOSPITAL_BASED_OUTPATIENT_CLINIC_OR_DEPARTMENT_OTHER): Payer: Medicare Other | Admitting: Oncology

## 2011-12-06 ENCOUNTER — Other Ambulatory Visit (HOSPITAL_BASED_OUTPATIENT_CLINIC_OR_DEPARTMENT_OTHER): Payer: Medicare Other | Admitting: Lab

## 2011-12-06 VITALS — BP 102/51 | HR 68 | Temp 97.1°F | Ht 70.0 in | Wt 204.8 lb

## 2011-12-06 DIAGNOSIS — D473 Essential (hemorrhagic) thrombocythemia: Secondary | ICD-10-CM

## 2011-12-06 DIAGNOSIS — D649 Anemia, unspecified: Secondary | ICD-10-CM

## 2011-12-06 DIAGNOSIS — D539 Nutritional anemia, unspecified: Secondary | ICD-10-CM

## 2011-12-06 DIAGNOSIS — R252 Cramp and spasm: Secondary | ICD-10-CM

## 2011-12-06 LAB — CBC WITH DIFFERENTIAL/PLATELET
Eosinophils Absolute: 0.1 10*3/uL (ref 0.0–0.5)
HCT: 30.5 % — ABNORMAL LOW (ref 38.4–49.9)
LYMPH%: 11.9 % — ABNORMAL LOW (ref 14.0–49.0)
MONO#: 0.5 10*3/uL (ref 0.1–0.9)
NEUT#: 4 10*3/uL (ref 1.5–6.5)
Platelets: 590 10*3/uL — ABNORMAL HIGH (ref 140–400)
RBC: 2.82 10*6/uL — ABNORMAL LOW (ref 4.20–5.82)
WBC: 5.2 10*3/uL (ref 4.0–10.3)
nRBC: 1 % — ABNORMAL HIGH (ref 0–0)

## 2011-12-06 MED ORDER — HYDROXYUREA 500 MG PO CAPS: 500.0000 mg | ORAL_CAPSULE | Freq: Every day | ORAL | Status: DC

## 2011-12-06 NOTE — Progress Notes (Signed)
OFFICE PROGRESS NOTE   INTERVAL HISTORY:   He returns as scheduled. He had a "cold "last week. No fever. No sign of venous or arterial thrombosis. No recent nose bleed. He reports mild dyspnea with exertion. He is able to go about his usual activities without difficulty.  Objective:  Vital signs in last 24 hours:  Blood pressure 102/51, pulse 68, temperature 97.1 F (36.2 C), temperature source Oral, height 5\' 10"  (1.778 m), weight 204 lb 12.8 oz (92.897 kg).    HEENT: No thrush or ulcers Resp: Lungs clear bilaterally Cardio: Regular rate and rhythm GI: No hepatosplenomegaly Vascular: The right lower leg is larger than the left side, no edema   Lab Results:  Lab Results  Component Value Date   WBC 5.2 12/06/2011   HGB 10.0* 12/06/2011   HCT 30.5* 12/06/2011   MCV 108.2* 12/06/2011   PLT 590* 12/06/2011   ANC 4.0   Medications: I have reviewed the patient's current medications.  Assessment/Plan: 1. Essential thrombocytosis.  He continues hydroxyurea therapy.  The platelet count is stable. 2.     History of a mild macrocytic anemia.  The hemoglobin has again decreased. We decided to dose reduce the hydroxyurea to 500 mg daily except 1000 mg on Monday and Friday.  Disposition:  The platelet count remains adequately controlled. We decided to decrease the hydroxyurea dose due to the anemia. He will return for a CBC in 6 weeks. We scheduled a 3 month office visit.   Lucile Shutters, MD  12/06/2011  4:32 PM

## 2011-12-06 NOTE — Telephone Encounter (Signed)
Dose changed during office visit today.

## 2011-12-06 NOTE — Telephone Encounter (Signed)
appts made and printed for pt aom °

## 2012-01-17 ENCOUNTER — Other Ambulatory Visit (HOSPITAL_BASED_OUTPATIENT_CLINIC_OR_DEPARTMENT_OTHER): Payer: Medicare Other | Admitting: Lab

## 2012-01-17 DIAGNOSIS — D473 Essential (hemorrhagic) thrombocythemia: Secondary | ICD-10-CM

## 2012-01-17 DIAGNOSIS — D649 Anemia, unspecified: Secondary | ICD-10-CM

## 2012-01-17 LAB — CBC WITH DIFFERENTIAL/PLATELET
Basophils Absolute: 0 10*3/uL (ref 0.0–0.1)
EOS%: 1.2 % (ref 0.0–7.0)
Eosinophils Absolute: 0.1 10*3/uL (ref 0.0–0.5)
HCT: 33 % — ABNORMAL LOW (ref 38.4–49.9)
HGB: 10.4 g/dL — ABNORMAL LOW (ref 13.0–17.1)
MCH: 34.3 pg — ABNORMAL HIGH (ref 27.2–33.4)
MONO#: 0.5 10*3/uL (ref 0.1–0.9)
NEUT#: 4.2 10*3/uL (ref 1.5–6.5)
NEUT%: 74.3 % (ref 39.0–75.0)
lymph#: 0.8 10*3/uL — ABNORMAL LOW (ref 0.9–3.3)

## 2012-01-22 ENCOUNTER — Telehealth: Payer: Self-pay | Admitting: *Deleted

## 2012-01-22 NOTE — Telephone Encounter (Signed)
Message copied by Caleb Popp on Wed Jan 22, 2012  1:24 PM ------      Message from: Ladene Artist      Created: Sun Jan 19, 2012  6:51 PM       Please call patient, same hydroxyurea, f/u as scheduled

## 2012-01-22 NOTE — Telephone Encounter (Signed)
Called pt with lab results, continue same dose of Hydrea. He verbalized understanding. Next appt confirmed.

## 2012-02-21 ENCOUNTER — Telehealth: Payer: Self-pay | Admitting: Oncology

## 2012-02-21 NOTE — Telephone Encounter (Signed)
called pts home s/w wife and provided new appt d/t for 06/14

## 2012-02-28 ENCOUNTER — Ambulatory Visit: Payer: Medicare Other | Admitting: Oncology

## 2012-02-28 ENCOUNTER — Other Ambulatory Visit: Payer: Medicare Other | Admitting: Lab

## 2012-03-06 ENCOUNTER — Telehealth: Payer: Self-pay | Admitting: Oncology

## 2012-03-06 ENCOUNTER — Ambulatory Visit (HOSPITAL_BASED_OUTPATIENT_CLINIC_OR_DEPARTMENT_OTHER): Payer: Medicare Other | Admitting: Oncology

## 2012-03-06 ENCOUNTER — Other Ambulatory Visit (HOSPITAL_BASED_OUTPATIENT_CLINIC_OR_DEPARTMENT_OTHER): Payer: Medicare Other | Admitting: Lab

## 2012-03-06 VITALS — BP 113/59 | HR 65 | Temp 97.7°F | Ht 70.0 in | Wt 198.6 lb

## 2012-03-06 DIAGNOSIS — D473 Essential (hemorrhagic) thrombocythemia: Secondary | ICD-10-CM

## 2012-03-06 LAB — CBC WITH DIFFERENTIAL/PLATELET
BASO%: 0.6 % (ref 0.0–2.0)
Basophils Absolute: 0 10*3/uL (ref 0.0–0.1)
Eosinophils Absolute: 0.1 10*3/uL (ref 0.0–0.5)
HCT: 31.4 % — ABNORMAL LOW (ref 38.4–49.9)
HGB: 10.1 g/dL — ABNORMAL LOW (ref 13.0–17.1)
MONO#: 0.5 10*3/uL (ref 0.1–0.9)
NEUT#: 4.2 10*3/uL (ref 1.5–6.5)
NEUT%: 76.7 % — ABNORMAL HIGH (ref 39.0–75.0)
Platelets: 668 10*3/uL — ABNORMAL HIGH (ref 140–400)
WBC: 5.5 10*3/uL (ref 4.0–10.3)
lymph#: 0.7 10*3/uL — ABNORMAL LOW (ref 0.9–3.3)

## 2012-03-06 NOTE — Telephone Encounter (Signed)
Pt aware of appts and printed   aom

## 2012-03-06 NOTE — Progress Notes (Signed)
   [facility]    OFFICE PROGRESS NOTE   INTERVAL HISTORY:   He returns as scheduled. He continues hydroxyurea. No sign of venous or arterial thrombosis.  To Recinos reports an upper respiratory infection for the past 6 weeks. He developed a cough and left year fullness. He was seen at an urgent care and prescribed antibiotics. He reports the symptoms have improved. No fever or shortness of breath.  Objective:  Vital signs in last 24 hours:  Blood pressure 113/59, pulse 65, temperature 97.7 F (36.5 C), temperature source Oral, height 5\' 10"  (1.778 m), weight 198 lb 9.6 oz (90.084 kg).    HEENT: No thrush or ulcers Resp: Lungs clear bilaterally Cardio: Regular rate and rhythm GI: No hepatosplenomegaly Vascular: No leg edema, right lower leg is slightly larger than the left side   Lab Results:  Lab Results  Component Value Date   WBC 5.5 03/06/2012   HGB 10.1* 03/06/2012   HCT 31.4* 03/06/2012   MCV 108.5* 03/06/2012   PLT 668* 03/06/2012   ANC 4.2    Medications: I have reviewed the patient's current medications.  Assessment/Plan: 1. Essential thrombocytosis. He continues hydroxyurea therapy. The platelet count is stable. 2. History of a mild macrocytic anemia. The hemoglobin has again decreased. We decided to dose reduce the hydroxyurea to 500 mg daily except 1000 mg on Monday and Friday.    Disposition:  He appears stable. The platelet count remains elevated on hydroxyurea. The current elevation of the platelet count could be related to a recent infection.  I discussed options with Mr. Weekly. The hemoglobin has declined with an increased dose of hydroxyurea. We could consider switching to anagrelide, but this would require dosing several times per day and there is an increased cost.  He has no symptoms from the essential thrombocytosis and there has been no vascular events. We decided to continue the current hydroxyurea dose for now. He will  return for a CBC in 2 months. We will consider increasing the hydroxyurea dose slightly or switching to anagrelide if the platelet count remains elevated.   Thornton Papas, MD  03/06/2012  11:05 AM

## 2012-03-30 ENCOUNTER — Encounter: Payer: Self-pay | Admitting: Dietician

## 2012-03-30 NOTE — Progress Notes (Signed)
Brief Out-patient Oncology Nutrition Note  Reason: Patient screened positive for nutrition risk for unintentional weight loss and decreased appetite.   Jack Schultz is an 78 year old male patient of Dr. Truett Perna, diagnosed with essential thrombocytosis. Contacted patient via home telephone for nutrition risk. Patient reported his appetite and intake are very good. He reported his weight is staying stable. He does not have any nutrition questions or concerns.   Wt Readings from Last 10 Encounters:  03/06/12 198 lb 9.6 oz (90.084 kg)  12/06/11 204 lb 12.8 oz (92.897 kg)   RD available for nutrition needs.   Iven Finn [facility] [phone]

## 2012-05-06 ENCOUNTER — Other Ambulatory Visit (HOSPITAL_BASED_OUTPATIENT_CLINIC_OR_DEPARTMENT_OTHER): Payer: Medicare Other | Admitting: Lab

## 2012-05-06 ENCOUNTER — Telehealth: Payer: Self-pay | Admitting: *Deleted

## 2012-05-06 DIAGNOSIS — D473 Essential (hemorrhagic) thrombocythemia: Secondary | ICD-10-CM

## 2012-05-06 LAB — COMPREHENSIVE METABOLIC PANEL
ALT: 12 U/L (ref 0–53)
BUN: 22 mg/dL (ref 6–23)
CO2: 28 mEq/L (ref 19–32)
Calcium: 8.8 mg/dL (ref 8.4–10.5)
Chloride: 106 mEq/L (ref 96–112)
Creatinine, Ser: 1.21 mg/dL (ref 0.50–1.35)
Glucose, Bld: 104 mg/dL — ABNORMAL HIGH (ref 70–99)

## 2012-05-06 LAB — CBC WITH DIFFERENTIAL/PLATELET
BASO%: 0.6 % (ref 0.0–2.0)
Eosinophils Absolute: 0.1 10*3/uL (ref 0.0–0.5)
HCT: 33 % — ABNORMAL LOW (ref 38.4–49.9)
MCHC: 32.1 g/dL (ref 32.0–36.0)
MONO#: 0.3 10*3/uL (ref 0.1–0.9)
NEUT#: 3.5 10*3/uL (ref 1.5–6.5)
NEUT%: 78.9 % — ABNORMAL HIGH (ref 39.0–75.0)
WBC: 4.4 10*3/uL (ref 4.0–10.3)
lymph#: 0.5 10*3/uL — ABNORMAL LOW (ref 0.9–3.3)

## 2012-05-06 NOTE — Telephone Encounter (Signed)
MD review of CBC-stable. Continue Hydrea at 500 mg daily, except 1000 mg on M & F. Recheck lab 10/16

## 2012-05-21 ENCOUNTER — Telehealth: Payer: Self-pay | Admitting: *Deleted

## 2012-05-21 NOTE — Telephone Encounter (Signed)
Called and spoke with pt, per Dr. Truett Perna continue Hydrea 500mg  daily except 1000mg  Monday and Fridays and follow-up as scheduled.  Pt verbalized understanding.

## 2012-05-22 ENCOUNTER — Other Ambulatory Visit: Payer: Self-pay | Admitting: Oncology

## 2012-05-22 DIAGNOSIS — D473 Essential (hemorrhagic) thrombocythemia: Secondary | ICD-10-CM

## 2012-07-08 ENCOUNTER — Other Ambulatory Visit (HOSPITAL_BASED_OUTPATIENT_CLINIC_OR_DEPARTMENT_OTHER): Payer: Medicare Other | Admitting: Lab

## 2012-07-08 DIAGNOSIS — D473 Essential (hemorrhagic) thrombocythemia: Secondary | ICD-10-CM | POA: Insufficient documentation

## 2012-07-08 LAB — CBC WITH DIFFERENTIAL/PLATELET
Basophils Absolute: 0 10*3/uL (ref 0.0–0.1)
HCT: 36.9 % — ABNORMAL LOW (ref 38.4–49.9)
HGB: 11.8 g/dL — ABNORMAL LOW (ref 13.0–17.1)
MCH: 33.1 pg (ref 27.2–33.4)
MONO#: 0.4 10*3/uL (ref 0.1–0.9)
NEUT%: 76.5 % — ABNORMAL HIGH (ref 39.0–75.0)
WBC: 5.1 10*3/uL (ref 4.0–10.3)
lymph#: 0.7 10*3/uL — ABNORMAL LOW (ref 0.9–3.3)

## 2012-07-09 ENCOUNTER — Telehealth: Payer: Self-pay | Admitting: *Deleted

## 2012-07-09 NOTE — Telephone Encounter (Signed)
Patient notified of CBC results. Continue Hydrea at 500 mg daily, except 1000 mg on Monday & Friday. Will recheck in 2 months as scheduled.

## 2012-07-09 NOTE — Telephone Encounter (Signed)
Message copied by Wandalee Ferdinand on Thu Jul 09, 2012  3:16 PM ------      Message from: Thornton Papas B      Created: Thu Jul 09, 2012  7:04 AM       Please call patient, cbc ok, continue same hydrea

## 2012-09-04 ENCOUNTER — Ambulatory Visit (HOSPITAL_BASED_OUTPATIENT_CLINIC_OR_DEPARTMENT_OTHER): Payer: Medicare Other | Admitting: Oncology

## 2012-09-04 ENCOUNTER — Telehealth: Payer: Self-pay | Admitting: Oncology

## 2012-09-04 ENCOUNTER — Other Ambulatory Visit (HOSPITAL_BASED_OUTPATIENT_CLINIC_OR_DEPARTMENT_OTHER): Payer: Medicare Other | Admitting: Lab

## 2012-09-04 VITALS — BP 127/65 | HR 57 | Temp 96.8°F | Resp 18 | Ht 70.0 in | Wt 202.8 lb

## 2012-09-04 DIAGNOSIS — D473 Essential (hemorrhagic) thrombocythemia: Secondary | ICD-10-CM

## 2012-09-04 DIAGNOSIS — D539 Nutritional anemia, unspecified: Secondary | ICD-10-CM

## 2012-09-04 LAB — CBC WITH DIFFERENTIAL/PLATELET
Basophils Absolute: 0 10*3/uL (ref 0.0–0.1)
Eosinophils Absolute: 0 10*3/uL (ref 0.0–0.5)
HGB: 9.9 g/dL — ABNORMAL LOW (ref 13.0–17.1)
LYMPH%: 13.5 % — ABNORMAL LOW (ref 14.0–49.0)
MCV: 104.4 fL — ABNORMAL HIGH (ref 79.3–98.0)
MONO%: 8.4 % (ref 0.0–14.0)
NEUT#: 3.3 10*3/uL (ref 1.5–6.5)
Platelets: 486 10*3/uL — ABNORMAL HIGH (ref 140–400)

## 2012-09-04 NOTE — Progress Notes (Signed)
   [facility]    OFFICE PROGRESS NOTE   INTERVAL HISTORY:   He returns as scheduled. He continues hydroxyurea. He had a significant nosebleed last week. No other bleeding. He reports malaise. No other complaint.  Objective:  Vital signs in last 24 hours:  Blood pressure 127/65, pulse 57, temperature 96.8 F (36 C), temperature source Oral, resp. rate 18, height 5\' 10"  (1.778 m), weight 202 lb 12.8 oz (91.989 kg).    HEENT: No thrush or ulcers Resp: Lungs clear bilaterally Cardio: Regular rate and rhythm GI: No hepatosplenomegaly Vascular: Trace edema at the right lower leg.   Lab Results:  Lab Results  Component Value Date   WBC 4.3 09/04/2012   HGB 9.9* 09/04/2012   HCT 30.7* 09/04/2012   MCV 104.4* 09/04/2012   PLT 486* 09/04/2012   ANC 3.9    Medications: I have reviewed the patient's current medications.  Assessment/Plan: 1. Essential thrombocytosis. He continues hydroxyurea therapy. The platelet count is stable.       2. History of a mild macrocytic anemia. The hemoglobin has again decreased. This may be related to the recent epistaxis .       3.  epistaxis-he reports this occurs once every 3-4 years,? Related to the myeloproliferative disorder and aspirin therapy.   Disposition:  He will continue hydroxyurea at the same dose. He will return for a CBC in 6 weeks to followup on the low hemoglobin. Mr. Whittenberg is scheduled for an office visit in 6 months.   Thornton Papas, MD  09/04/2012  11:26 AM

## 2012-09-04 NOTE — Telephone Encounter (Signed)
appts made and printed for pt by lab orders and per pt,no pof sent    Jack Schultz

## 2012-10-15 ENCOUNTER — Other Ambulatory Visit (HOSPITAL_BASED_OUTPATIENT_CLINIC_OR_DEPARTMENT_OTHER): Payer: Medicare Other

## 2012-10-15 DIAGNOSIS — D473 Essential (hemorrhagic) thrombocythemia: Secondary | ICD-10-CM

## 2012-10-15 LAB — CBC WITH DIFFERENTIAL/PLATELET
BASO%: 0.5 % (ref 0.0–2.0)
EOS%: 1.6 % (ref 0.0–7.0)
MCH: 34.7 pg — ABNORMAL HIGH (ref 27.2–33.4)
MCHC: 32.3 g/dL (ref 32.0–36.0)
MONO#: 0.4 10*3/uL (ref 0.1–0.9)
RDW: 24.8 % — ABNORMAL HIGH (ref 11.0–14.6)
WBC: 5.5 10*3/uL (ref 4.0–10.3)
lymph#: 0.7 10*3/uL — ABNORMAL LOW (ref 0.9–3.3)
nRBC: 1 % — ABNORMAL HIGH (ref 0–0)

## 2012-10-19 ENCOUNTER — Other Ambulatory Visit: Payer: Self-pay | Admitting: Oncology

## 2012-10-21 ENCOUNTER — Telehealth: Payer: Self-pay | Admitting: *Deleted

## 2012-10-21 NOTE — Telephone Encounter (Signed)
Message copied by Wandalee Ferdinand on Wed Oct 21, 2012  4:42 PM ------      Message from: Ladene Artist      Created: Sat Oct 17, 2012  3:28 PM       Please call patient, hb better, cont. Hydrea, f/u lab March as scheduled

## 2012-10-21 NOTE — Telephone Encounter (Signed)
Notified patient of CBC results. Will continue Hydrea at current dose and recheck lab as scheduled in March.

## 2012-11-26 ENCOUNTER — Other Ambulatory Visit (HOSPITAL_BASED_OUTPATIENT_CLINIC_OR_DEPARTMENT_OTHER): Payer: Medicare Other | Admitting: Lab

## 2012-11-26 DIAGNOSIS — D473 Essential (hemorrhagic) thrombocythemia: Secondary | ICD-10-CM

## 2012-11-26 LAB — CBC WITH DIFFERENTIAL/PLATELET
BASO%: 1 % (ref 0.0–2.0)
Basophils Absolute: 0.1 10*3/uL (ref 0.0–0.1)
EOS%: 1.4 % (ref 0.0–7.0)
HCT: 34.1 % — ABNORMAL LOW (ref 38.4–49.9)
LYMPH%: 14 % (ref 14.0–49.0)
MCH: 34.2 pg — ABNORMAL HIGH (ref 27.2–33.4)
MCHC: 32 g/dL (ref 32.0–36.0)
MCV: 106.9 fL — ABNORMAL HIGH (ref 79.3–98.0)
NEUT#: 3.7 10*3/uL (ref 1.5–6.5)
NEUT%: 73 % (ref 39.0–75.0)
Platelets: 510 10*3/uL — ABNORMAL HIGH (ref 140–400)
WBC: 5.1 10*3/uL (ref 4.0–10.3)

## 2012-11-27 ENCOUNTER — Telehealth: Payer: Self-pay | Admitting: *Deleted

## 2012-11-27 NOTE — Telephone Encounter (Signed)
Per Dr. Truett Perna, called pt and gave results of lab, continue same Hydrea.  Pt verbalized understanding and confirmed appt for 02/25/13

## 2012-11-27 NOTE — Telephone Encounter (Signed)
Message copied by Gala Romney on Fri Nov 27, 2012  3:38 PM ------      Message from: Thornton Papas B      Created: Thu Nov 26, 2012  9:56 PM       Please call patient, stable hb and plts, cont. Hydrea-same dose, f/u as scheduled ------

## 2012-12-31 ENCOUNTER — Encounter: Payer: Self-pay | Admitting: Oncology

## 2013-01-16 ENCOUNTER — Other Ambulatory Visit: Payer: Self-pay | Admitting: Oncology

## 2013-01-16 DIAGNOSIS — C50919 Malignant neoplasm of unspecified site of unspecified female breast: Secondary | ICD-10-CM

## 2013-01-16 DIAGNOSIS — C7952 Secondary malignant neoplasm of bone marrow: Secondary | ICD-10-CM

## 2013-01-25 ENCOUNTER — Other Ambulatory Visit: Payer: Self-pay | Admitting: Oncology

## 2013-02-25 ENCOUNTER — Other Ambulatory Visit (HOSPITAL_BASED_OUTPATIENT_CLINIC_OR_DEPARTMENT_OTHER): Payer: Medicare Other | Admitting: Lab

## 2013-02-25 ENCOUNTER — Telehealth: Payer: Self-pay | Admitting: Oncology

## 2013-02-25 ENCOUNTER — Ambulatory Visit (HOSPITAL_BASED_OUTPATIENT_CLINIC_OR_DEPARTMENT_OTHER): Payer: Medicare Other | Admitting: Oncology

## 2013-02-25 VITALS — BP 122/67 | HR 57 | Temp 97.5°F | Resp 18 | Ht 70.0 in | Wt 199.6 lb

## 2013-02-25 DIAGNOSIS — R040 Epistaxis: Secondary | ICD-10-CM

## 2013-02-25 DIAGNOSIS — D473 Essential (hemorrhagic) thrombocythemia: Secondary | ICD-10-CM

## 2013-02-25 DIAGNOSIS — D649 Anemia, unspecified: Secondary | ICD-10-CM

## 2013-02-25 LAB — CBC WITH DIFFERENTIAL/PLATELET
Basophils Absolute: 0 10*3/uL (ref 0.0–0.1)
EOS%: 1 % (ref 0.0–7.0)
Eosinophils Absolute: 0 10*3/uL (ref 0.0–0.5)
LYMPH%: 12.8 % — ABNORMAL LOW (ref 14.0–49.0)
MCH: 33.7 pg — ABNORMAL HIGH (ref 27.2–33.4)
MCV: 105.3 fL — ABNORMAL HIGH (ref 79.3–98.0)
MONO%: 8.2 % (ref 0.0–14.0)
Platelets: 378 10*3/uL (ref 140–400)
RBC: 3.03 10*6/uL — ABNORMAL LOW (ref 4.20–5.82)
RDW: 24 % — ABNORMAL HIGH (ref 11.0–14.6)

## 2013-02-25 NOTE — Progress Notes (Signed)
   [facility]    OFFICE PROGRESS NOTE   INTERVAL HISTORY:   He returns as scheduled. He continues hydroxyurea. No new complaint. No history of venous or arterial thrombosis. He reports a recent episode of discomfort at the left lower leg. He was evaluated by Dr. Oneta Rack and felt to have "back "related pain. Good energy level.  Objective:  Vital signs in last 24 hours:  Blood pressure 122/67, pulse 57, temperature 97.5 F (36.4 C), temperature source Oral, resp. rate 18, height 5\' 10"  (1.778 m), weight 199 lb 9.6 oz (90.538 kg), SpO2 100.00%.    HEENT: No thrush or ulcers Resp: Lungs clear bilaterally Cardio: Regular rate and rhythm GI: No hepatomegaly Vascular: The right lower leg is slightly larger than the left side, no significant edema. No erythema.   Lab Results:  Lab Results  Component Value Date   WBC 3.9* 02/25/2013   HGB 10.2* 02/25/2013   HCT 31.9* 02/25/2013   MCV 105.3* 02/25/2013   PLT 378 02/25/2013   ANC 3.0    Medications: I have reviewed the patient's current medications.  Assessment/Plan: 1. Essential thrombocytosis. He continues hydroxyurea therapy. The platelet count is stable.       2. History of a mild macrocytic anemia. Most likely secondary to hydroxyurea therapy.       3. epistaxis-he reports this occurs once every 3-4 years,? Related to the myeloproliferative disorder and aspirin therapy.   Disposition:  He appears unchanged. He has no documented history of venous or arterial thrombosis. We decided to decrease the hydroxyurea dose to 500 mg daily. Hopefully this will allow for some improvement in the anemia. Mr. Holzhauer were return for a lab visit in 3 months. He is scheduled for a 6 month office visit.   Thornton Papas, MD  02/25/2013  12:55 PM

## 2013-02-25 NOTE — Telephone Encounter (Signed)
gv and printed appt sched and avs for pt  °

## 2013-03-03 ENCOUNTER — Other Ambulatory Visit: Payer: Self-pay | Admitting: Oncology

## 2013-03-03 NOTE — Telephone Encounter (Signed)
Dose changed to 500 mg daily on 02/24/13 visit.

## 2013-05-27 ENCOUNTER — Other Ambulatory Visit (HOSPITAL_BASED_OUTPATIENT_CLINIC_OR_DEPARTMENT_OTHER): Payer: Medicare Other

## 2013-05-27 DIAGNOSIS — D473 Essential (hemorrhagic) thrombocythemia: Secondary | ICD-10-CM

## 2013-05-27 LAB — CBC WITH DIFFERENTIAL/PLATELET
BASO%: 0.4 % (ref 0.0–2.0)
EOS%: 1.3 % (ref 0.0–7.0)
HCT: 34.6 % — ABNORMAL LOW (ref 38.4–49.9)
LYMPH%: 10.3 % — ABNORMAL LOW (ref 14.0–49.0)
MCH: 31.3 pg (ref 27.2–33.4)
MCHC: 31.5 g/dL — ABNORMAL LOW (ref 32.0–36.0)
MCV: 99.4 fL — ABNORMAL HIGH (ref 79.3–98.0)
MONO%: 7.2 % (ref 0.0–14.0)
NEUT%: 80.8 % — ABNORMAL HIGH (ref 39.0–75.0)
Platelets: 409 10*3/uL — ABNORMAL HIGH (ref 140–400)

## 2013-05-28 ENCOUNTER — Telehealth: Payer: Self-pay | Admitting: *Deleted

## 2013-05-28 NOTE — Telephone Encounter (Signed)
Message copied by Caleb Popp on Fri May 28, 2013  5:02 PM ------      Message from: Thornton Papas B      Created: Thu May 27, 2013  9:35 PM       Please call patient, cbc is ok, cont. hydrea ------

## 2013-05-28 NOTE — Telephone Encounter (Signed)
Called pt, CBC Ok, per Dr. Truett Perna. Continue Hydrea. Pt voiced understanding. Currently taking Hydrea 500 mg daily.

## 2013-07-31 ENCOUNTER — Other Ambulatory Visit: Payer: Self-pay | Admitting: Oncology

## 2013-07-31 DIAGNOSIS — D473 Essential (hemorrhagic) thrombocythemia: Secondary | ICD-10-CM

## 2013-08-26 ENCOUNTER — Ambulatory Visit (HOSPITAL_BASED_OUTPATIENT_CLINIC_OR_DEPARTMENT_OTHER): Payer: Medicare Other | Admitting: Oncology

## 2013-08-26 ENCOUNTER — Telehealth: Payer: Self-pay | Admitting: *Deleted

## 2013-08-26 ENCOUNTER — Other Ambulatory Visit (HOSPITAL_BASED_OUTPATIENT_CLINIC_OR_DEPARTMENT_OTHER): Payer: Medicare Other | Admitting: Lab

## 2013-08-26 VITALS — BP 137/61 | HR 96 | Temp 97.8°F | Resp 20 | Ht 70.0 in | Wt 193.3 lb

## 2013-08-26 DIAGNOSIS — D473 Essential (hemorrhagic) thrombocythemia: Secondary | ICD-10-CM

## 2013-08-26 DIAGNOSIS — D649 Anemia, unspecified: Secondary | ICD-10-CM

## 2013-08-26 LAB — CBC WITH DIFFERENTIAL/PLATELET
BASO%: 1.2 % (ref 0.0–2.0)
Eosinophils Absolute: 0.1 10*3/uL (ref 0.0–0.5)
LYMPH%: 4.9 % — ABNORMAL LOW (ref 14.0–49.0)
MCHC: 31.4 g/dL — ABNORMAL LOW (ref 32.0–36.0)
MCV: 101 fL — ABNORMAL HIGH (ref 79.3–98.0)
MONO%: 11.2 % (ref 0.0–14.0)
NEUT%: 81.8 % — ABNORMAL HIGH (ref 39.0–75.0)
Platelets: 438 10*3/uL — ABNORMAL HIGH (ref 140–400)
RBC: 3.12 10*6/uL — ABNORMAL LOW (ref 4.20–5.82)
nRBC: 4 % — ABNORMAL HIGH (ref 0–0)

## 2013-08-26 NOTE — Telephone Encounter (Signed)
appts made and printed...td 

## 2013-08-26 NOTE — Progress Notes (Signed)
   Jack Schultz    OFFICE PROGRESS NOTE   INTERVAL HISTORY:   Jack Schultz returns for scheduled followup of essential thrombocytosis. He continues hydroxyurea. He reports stable exertional dyspnea. He continues to work around his home. He had a severe nosebleed over the Thanksgiving holiday that required packing. He reports having a similar nosebleed once every few years. No other bleeding. He is stool turned dark when he began taking iron.  Objective:  Vital signs in last 24 hours:  Blood pressure 137/61, pulse 96, temperature 97.8 F (36.6 C), temperature source Oral, resp. rate 20, height 5\' 10"  (1.778 m), weight 193 lb 4.8 oz (87.68 kg).    HEENT: No thrush or ulcers. Resp: Lungs clear bilaterally Cardio: Regular rate and rhythm GI: No hepatosplenomegaly, nontender Vascular: The right lower leg is larger than the left side. No erythema.   Lab Results:  Lab Results  Component Value Date   WBC 9.7 08/26/2013   HGB 9.9* 08/26/2013   HCT 31.5* 08/26/2013   MCV 101.0* 08/26/2013   PLT 438* 08/26/2013   ANC 7.9  05/27/2013-hemoglobin   10.9    Medications: I have reviewed the patient's current medications.  Assessment/Plan: 1. Essential thrombocytosis. He continues hydroxyurea therapy. The platelet count is stable. 2. History of a mild macrocytic anemia. Most likely secondary to hydroxyurea therapy. The hemoglobin is lower today,? Related to the recent nosebleed.  3. history of epistaxis-he reports this occurs once every 1-2 years. Related to the myeloproliferative disorder and aspirin therapy?    Disposition:  His overall status appears unchanged. The hemoglobin is lower today. This may be related to the recent nosebleed. He will continue hydroxyurea at current dose. He will contact us for recurrent bleeding. Jack Schultz will return for a CBC in 6 weeks. He is scheduled for a 6 month office visit.   Thornton Papas, MD  08/26/2013  12:46 PM

## 2013-09-13 ENCOUNTER — Encounter: Payer: Self-pay | Admitting: Internal Medicine

## 2013-09-14 ENCOUNTER — Ambulatory Visit (INDEPENDENT_AMBULATORY_CARE_PROVIDER_SITE_OTHER): Payer: Medicare Other | Admitting: Internal Medicine

## 2013-09-14 ENCOUNTER — Encounter: Payer: Self-pay | Admitting: Internal Medicine

## 2013-09-14 VITALS — BP 122/64 | HR 60 | Temp 97.3°F | Resp 16 | Wt 193.8 lb

## 2013-09-14 DIAGNOSIS — E559 Vitamin D deficiency, unspecified: Secondary | ICD-10-CM

## 2013-09-14 DIAGNOSIS — I10 Essential (primary) hypertension: Secondary | ICD-10-CM

## 2013-09-14 DIAGNOSIS — R7303 Prediabetes: Secondary | ICD-10-CM | POA: Insufficient documentation

## 2013-09-14 DIAGNOSIS — Z79899 Other long term (current) drug therapy: Secondary | ICD-10-CM

## 2013-09-14 DIAGNOSIS — E782 Mixed hyperlipidemia: Secondary | ICD-10-CM | POA: Insufficient documentation

## 2013-09-14 DIAGNOSIS — R7309 Other abnormal glucose: Secondary | ICD-10-CM

## 2013-09-14 LAB — CBC WITH DIFFERENTIAL/PLATELET
Basophils Absolute: 0.1 10*3/uL (ref 0.0–0.1)
Basophils Relative: 1 % (ref 0–1)
HCT: 31.5 % — ABNORMAL LOW (ref 39.0–52.0)
Lymphocytes Relative: 12 % (ref 12–46)
Lymphs Abs: 0.9 10*3/uL (ref 0.7–4.0)
MCV: 98.7 fL (ref 78.0–100.0)
Monocytes Absolute: 0.7 10*3/uL (ref 0.1–1.0)
Neutro Abs: 6.1 10*3/uL (ref 1.7–7.7)
Neutrophils Relative %: 77 % (ref 43–77)
Platelets: 389 10*3/uL (ref 150–400)
RBC: 3.19 MIL/uL — ABNORMAL LOW (ref 4.22–5.81)
RDW: 26.2 % — ABNORMAL HIGH (ref 11.5–15.5)
WBC: 7.8 10*3/uL (ref 4.0–10.5)

## 2013-09-14 LAB — BASIC METABOLIC PANEL WITH GFR
CO2: 28 mEq/L (ref 19–32)
Chloride: 104 mEq/L (ref 96–112)
GFR, Est African American: 78 mL/min
GFR, Est Non African American: 67 mL/min
Potassium: 4.8 mEq/L (ref 3.5–5.3)

## 2013-09-14 LAB — HEPATIC FUNCTION PANEL
Albumin: 4.1 g/dL (ref 3.5–5.2)
Alkaline Phosphatase: 57 U/L (ref 39–117)
Indirect Bilirubin: 0.5 mg/dL (ref 0.0–0.9)
Total Bilirubin: 0.6 mg/dL (ref 0.3–1.2)

## 2013-09-14 LAB — LIPID PANEL
Cholesterol: 135 mg/dL (ref 0–200)
HDL: 25 mg/dL — ABNORMAL LOW (ref >39)
LDL Cholesterol: 84 mg/dL (ref 0–99)
Total CHOL/HDL Ratio: 5.4 Ratio
Triglycerides: 129 mg/dL (ref <150)
VLDL: 26 mg/dL (ref 0–40)

## 2013-09-14 NOTE — Patient Instructions (Signed)

## 2013-09-14 NOTE — Progress Notes (Signed)
Patient ID: Jack Schultz, male   DOB: January 04, 1924, 79 y.o.   MRN: [id number]   This very nice 79 y.o.male presents for 3 month follow up with Hypertension, Hyperlipidemia, Thrombocytosis, Pre-Diabetes and Vitamin D Deficiency.    BP has been controlled at home. Today's BP is 122/64. Patient denies any cardiac type chest pain, palpitations, dyspnea/orthopnea/PND, dizziness, claudication, or dependent edema.   Hyperlipidemia is controlled with diet. Last Cholesterol was 125, Triglycerides were 201, HDL 22 and LDL 63 - all at goal.    Also, the patient has history of PreDiabetes since 2010  with last A1c of 6.0% in September.. Patient denies any symptoms of reactive hypoglycemia, diabetic polys, paresthesias or visual blurring.   Further, Patient has history of Vitamin D Deficiency with last vitamin D of 65 (was 20 in 2008). Patient supplements vitamin D without any suspected side-effects. Patient denies myalgias or other med SE's.     Medication List       aspirin 81 MG tablet  Take 81 mg by mouth daily.     atenolol 50 MG tablet  Commonly known as:  TENORMIN  Take 50 mg by mouth.     ferrous sulfate 325 (65 FE) MG tablet  Take 325 mg by mouth daily with breakfast.     furosemide 40 MG tablet  Commonly known as:  LASIX  Take 20 mg by mouth Daily.     hydroxyurea 500 MG capsule  Commonly known as:  HYDREA  TAKE 1 CAPSULE (500 MG TOTAL) BY MOUTH DAILY.     levothyroxine 50 MCG tablet  Commonly known as:  SYNTHROID, LEVOTHROID  Take 50 mcg by mouth Daily.     Vitamin D 2000 UNITS tablet  Take 2,000 Units by mouth daily. Takes 2 bid         FHx:    Reviewed / unchanged  SHx:    Reviewed / unchanged  Systems Review: Constitutional: Denies fever, chills, wt changes, headaches, insomnia, fatigue, night sweats, change in appetite. Eyes: Denies redness, blurred vision, diplopia, discharge, itchy, watery eyes.  ENT: Denies discharge, congestion, post nasal drip, epistaxis,  sore throat, earache, hearing loss, dental pain, tinnitus, vertigo, sinus pain, snoring.  CV: Denies chest pain, palpitations, irregular heartbeat, syncope, dyspnea, diaphoresis, orthopnea, PND, claudication, edema. Respiratory: denies cough, dyspnea, DOE, pleurisy, hoarseness, laryngitis, wheezing.  Gastrointestinal: Denies dysphagia, odynophagia, heartburn, reflux, water brash, abdominal pain or cramps, nausea, vomiting, bloating, diarrhea, constipation, hematemesis, melena, hematochezia,  or hemorrhoids. Genitourinary: Denies dysuria, frequency, urgency, nocturia, hesitancy, discharge, hematuria, flank pain. Musculoskeletal: Denies arthralgias, myalgias, stiffness, jt. swelling, pain, limp, strain/sprain.  Skin: Denies pruritus, rash, hives, warts, acne, eczema, change in skin lesion(s). Neuro: No weakness, tremor, incoordination, spasms, paresthesia, or pain. Psychiatric: Denies confusion, memory loss, or sensory loss. Endo: Denies change in weight, skin, hair change.  Heme/Lymph: No excessive bleeding, bruising, orenlarged lymph nodes.  BP: 122/64  Pulse: 60  Temp: 97.3 F (36.3 C)  Resp: 16     Estimated body mass index is 27.74 kg/(m^2) as calculated from the following:   Height as of 08/26/13: 5\' 10"  (1.778 m).   Weight as of 08/26/13: 193 lb 4.8 oz (87.68 kg).  On Exam: Appears well nourished - in no distress. Eyes: PERRLA, EOMs, conjunctiva no swelling or erythema. Sinuses: No frontal/maxillary tenderness ENT/Mouth: EAC's clear, TM's nl w/o erythema, bulging. Nares clear w/o erythema, swelling, exudates. Oropharynx clear without erythema or exudates. Oral hygiene is good. Tongue normal, non obstructing. Hearing intact.  Neck:  Supple. Thyroid nl. Car 2+/2+ without bruits, nodes or JVD. Chest: Respirations nl with BS clear & equal w/o rales, rhonchi, wheezing or stridor.  Cor: Heart sounds normal w/ regular rate and rhythm without sig. murmurs, gallops, clicks, or rubs.  Peripheral pulses normal and equal  without edema.  Abdomen: Soft & bowel sounds normal. Non-tender w/o guarding, rebound, hernias, masses, or organomegaly.  Lymphatics: Unremarkable.  Musculoskeletal: Full ROM all peripheral extremities, joint stability, 5/5 strength, and normal gait.  Skin: Warm, dry without exposed rashes, lesions, ecchymosis apparent.  Neuro: Cranial nerves intact, reflexes equal bilaterally. Sensory-motor testing grossly intact. Tendon reflexes grossly intact.  Pysch: Alert & oriented x 3. Insight and judgement nl & appropriate. No ideations.  Assessment and Plan:  1. Hypertension - Continue monitor blood pressure at home. Continue diet/meds same.  2. Hyperlipidemia - Continue diet/meds, exercise,& lifestyle modifications. Continue monitor periodic cholesterol/liver & renal functions   3. Pre-diabetes - Continue diet, exercise, lifestyle modifications. Monitor appropriate labs.  4. Vitamin D Deficiency - Continue supplementation.  Recommended regular exercise, BP monitoring, weight control, and discussed med and SE's. Recommended labs to assess and monitor clinical status. Further disposition pending results of labs.

## 2013-10-07 ENCOUNTER — Other Ambulatory Visit (HOSPITAL_BASED_OUTPATIENT_CLINIC_OR_DEPARTMENT_OTHER): Payer: Medicare Other

## 2013-10-07 ENCOUNTER — Telehealth: Payer: Self-pay | Admitting: *Deleted

## 2013-10-07 DIAGNOSIS — D649 Anemia, unspecified: Secondary | ICD-10-CM

## 2013-10-07 DIAGNOSIS — D473 Essential (hemorrhagic) thrombocythemia: Secondary | ICD-10-CM

## 2013-10-07 LAB — CBC WITH DIFFERENTIAL/PLATELET
BASO%: 1.5 % (ref 0.0–2.0)
BASOS ABS: 0.1 10*3/uL (ref 0.0–0.1)
EOS%: 1.1 % (ref 0.0–7.0)
Eosinophils Absolute: 0.1 10*3/uL (ref 0.0–0.5)
HEMATOCRIT: 34.1 % — AB (ref 38.4–49.9)
HGB: 11.1 g/dL — ABNORMAL LOW (ref 13.0–17.1)
LYMPH#: 0.8 10*3/uL — AB (ref 0.9–3.3)
LYMPH%: 9.6 % — ABNORMAL LOW (ref 14.0–49.0)
MCH: 33.9 pg — ABNORMAL HIGH (ref 27.2–33.4)
MCHC: 32.6 g/dL (ref 32.0–36.0)
MCV: 104.2 fL — ABNORMAL HIGH (ref 79.3–98.0)
MONO#: 0.6 10*3/uL (ref 0.1–0.9)
MONO%: 7.2 % (ref 0.0–14.0)
NEUT%: 80.6 % — ABNORMAL HIGH (ref 39.0–75.0)
NEUTROS ABS: 6.9 10*3/uL — AB (ref 1.5–6.5)
Platelets: 424 10*3/uL — ABNORMAL HIGH (ref 140–400)
RBC: 3.28 10*6/uL — ABNORMAL LOW (ref 4.20–5.82)
RDW: 26.2 % — AB (ref 11.0–14.6)
WBC: 8.6 10*3/uL (ref 4.0–10.3)

## 2013-10-07 LAB — TECHNOLOGIST REVIEW

## 2013-10-07 NOTE — Telephone Encounter (Signed)
MD reviewed labs/tech review. Same Hydrea and follow up as scheduled. Patient notified.

## 2013-11-20 ENCOUNTER — Telehealth: Payer: Self-pay | Admitting: *Deleted

## 2013-11-20 ENCOUNTER — Encounter: Payer: Self-pay | Admitting: Oncology

## 2013-11-20 NOTE — Telephone Encounter (Signed)
Mailed provider change letter w/ calendar to pt and cancelled Dr. Gearldine Shown appt.

## 2013-11-25 ENCOUNTER — Telehealth: Payer: Self-pay | Admitting: *Deleted

## 2013-11-25 ENCOUNTER — Telehealth: Payer: Self-pay | Admitting: Oncology

## 2013-11-25 ENCOUNTER — Other Ambulatory Visit: Payer: Self-pay | Admitting: *Deleted

## 2013-11-25 DIAGNOSIS — D473 Essential (hemorrhagic) thrombocythemia: Secondary | ICD-10-CM

## 2013-11-25 NOTE — Telephone Encounter (Signed)
Confirmed with patient that he did get results of his last labs/cbc. Confirms next lab is 12/23/13-added smear & morphology to order per MD request. Noted his June appointment is with Dr. Julien Nordmann. Inquired if he had intended to change providers and he says "no, I want to keep Dr. Benay Spice". Will make scheduling aware of the need to change provider back to Dr. Benay Spice for June visit.

## 2013-11-25 NOTE — Telephone Encounter (Signed)
Message copied by Tania Ade on Thu Nov 25, 2013 10:07 AM ------      Message from: Jack Schultz      Created: Sat Nov 20, 2013  6:27 PM       Add smear next lab/morphology, if blasts are seen I will see him sooner            Looks like he is scheduled to see Julien Nordmann in June, must be a scheduling mistake ------

## 2013-11-25 NOTE — Telephone Encounter (Signed)
BS pt...............Marland Kitchen recieved note from [facility] desk nurse today that pt appeared on [facility]'s reassignment list in error and was moved to [facility]. appt moved back to [facility] and r/s from 6/4 to 6/8. s/w pt he is aware. confirmed w/pt lb for 4/2 and lb/BS 6/8.

## 2013-12-12 DIAGNOSIS — E039 Hypothyroidism, unspecified: Secondary | ICD-10-CM | POA: Insufficient documentation

## 2013-12-12 DIAGNOSIS — Z79899 Other long term (current) drug therapy: Secondary | ICD-10-CM | POA: Insufficient documentation

## 2013-12-12 NOTE — Progress Notes (Signed)
Patient ID: Jack Schultz, male   DOB: [birth year], 80 y.o.   MRN: [id number]   Annual  Comprehensive Examination  This very nice 80 y.o.  MWM presents for complete physical.  Patient has been followed for HTN, Thrombocythemia, Prediabetes, Hyperlipidemia, and Vitamin D Deficiency. Patient is followed by Dr Benay Spice for his thrombocythemia.   HTN predates since 1995. Patient's BP has been controlled at home.Today's BP: 124/68 mmHg. Patient denies any cardiac symptoms as chest pain, palpitations, shortness of breath, dizziness or ankle swelling.   Patient's hyperlipidemia is controlled with diet and medications. Patient denies myalgias or other medication SE's. Last lipids in Dec 2014 were at goal.  Lab Results  Component Value Date   CHOL 135 09/14/2013   HDL 25* 09/14/2013   LDLCALC 84 09/14/2013   TRIG 129 09/14/2013   CHOLHDL 5.4 09/14/2013     Patient has prediabetes of 6.2% in 08/2009 and last A1c was 6.0% in Sept 2014. Patient denies reactive hypoglycemic symptoms, visual blurring, diabetic polys, or paresthesias.    Finally, patient has history of Vitamin D Deficiency of 20 in 2008 with last vitamin D 65 in Sept 2014.   Medication Sig  . aspirin 81 MG tablet Take 81 mg by mouth daily.  Marland Kitchen atenolol (TENORMIN) 50 MG tablet Take 50 mg by mouth.   . Cholecalciferol (VITAMIN D) 2000 UNITS tablet Take 2,000 Units by mouth daily. Takes 2 bid  . ferrous sulfate 325 (65 FE) MG tablet Take 325 mg by mouth daily with breakfast.  . furosemide (LASIX) 40 MG tablet Take 20 mg by mouth Daily.  . hydroxyurea (HYDREA) 500 MG capsule TAKE 1 CAPSULE (500 MG TOTAL) BY MOUTH DAILY.  Marland Kitchen levothyroxine (SYNTHROID, LEVOTHROID) 50 MCG tablet Take 50 mcg by mouth Daily.     Allergies  Allergen Reactions  . Ace Inhibitors Cough    Past Medical History  Diagnosis Date  . Hypertension   . Hyperlipidemia   . Pre-diabetes   . Thyroid disease   . Thrombocytosis   . GERD (gastroesophageal reflux  disease)   . Vitamin D deficiency     History reviewed. No pertinent past surgical history.  Family History  Problem Relation Age of Onset  . Hypertension Father   . Heart disease Father     History   Social History  . Marital Status: Married    Spouse Name: N/A    Number of Children: 2 children, 4 GC and 3 GGC  . Years of Education: N/A   Occupational History  . 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 Smoking status: Former Smoker    Quit date: 09/24/1975  . Smokeless tobacco: Current User  . Alcohol Use: No  . Drug Use: No  . Sexual Activity: Inactive     ROS Constitutional: Denies fever, chills, weight loss/gain, headaches, insomnia, fatigue, night sweats, and change in appetite. Eyes: Denies redness, blurred vision, diplopia, discharge, itchy, watery eyes.  ENT: Denies discharge, congestion, post nasal drip, epistaxis, sore throat, earache, hearing loss, dental pain, Tinnitus, Vertigo, Sinus pain, snoring.  Cardio: Denies chest pain, palpitations, irregular heartbeat, syncope, dyspnea, diaphoresis, orthopnea, PND, claudication, edema Respiratory: denies cough, dyspnea, DOE, pleurisy, hoarseness, laryngitis, wheezing.  Gastrointestinal: Denies dysphagia, heartburn, reflux, water brash, pain, cramps, nausea, vomiting, bloating, diarrhea, constipation, hematemesis, melena, hematochezia, jaundice, hemorrhoids Genitourinary: Denies dysuria, frequency, urgency, nocturia, hesitancy, discharge, hematuria, flank pain Musculoskeletal: Denies arthralgia, myalgia, stiffness, Jt. Swelling,  pain, limp, and strain/sprain. Skin: Denies puritis, rash, hives, warts, acne, eczema, changing in skin lesion Neuro: No weakness, tremor, incoordination, spasms, paresthesia, pain Psychiatric: Denies confusion, memory loss, sensory loss Endocrine: Denies change in weight, skin, hair change, nocturia, and paresthesia, diabetic polys, visual  blurring, hyper / hypo glycemic episodes.  Heme/Lymph: No excessive bleeding, bruising, or elarged lymph nodes.  Physical Exam  BP 124/68  Pulse 64  Temp(Src) 97.9 F (36.6 C) (Temporal)  Resp 16  Ht 5\' 10"  (1.778 m)  Wt 193 lb 3.2 oz (87.635 kg)  BMI 27.72 kg/m2  General Appearance: Well nourished, in no apparent distress. Eyes: PERRLA, EOMs, conjunctiva no swelling or erythema, normal fundi and vessels. Sinuses: No frontal/maxillary tenderness ENT/Mouth: EACs patent / TMs  nl. Nares clear without erythema, swelling, mucoid exudates. Oral hygiene is good. No erythema, swelling, or exudate. Tongue normal, non-obstructing. Tonsils not swollen or erythematous. Hearing normal.  Neck: Supple, thyroid normal. No bruits, nodes or JVD. Respiratory: Respiratory effort normal.  BS equal and clear bilateral without rales, rhonci, wheezing or stridor. Cardio: Heart sounds are normal with regular rate and rhythm and no murmurs, rubs or gallops. Peripheral pulses are normal and equal bilaterally without edema. No aortic or femoral bruits. Chest: symmetric with normal excursions and percussion.  Abdomen: Flat, soft, with bowl sounds. Nontender, no guarding, rebound, hernias, masses, or organomegaly.  Lymphatics: Non tender without lymphadenopathy.  Genitourinary: Deferred due to age. Musculoskeletal: Full ROM all peripheral extremities, joint stability, 5/5 strength, and normal gait. Skin: Warm and dry without rashes, lesions, cyanosis, clubbing or  ecchymosis.  Neuro: Cranial nerves intact, reflexes equal bilaterally. Normal muscle tone, no cerebellar symptoms. Sensation intact.  Pysch: Awake and oriented X 3, normal affect, insight and judgment appropriate.   Assessment and Plan  1. Annual  Examination 2. Hypertension  3. Hyperlipidemia 4. Pre Diabetes 5. Vitamin D Deficiency 6. Thrombocythemia  Continue prudent diet as discussed, weight control, BP monitoring, regular exercise, and  medications as discussed.  Discussed med effects and SE's. Routine screening labs and tests as requested with regular follow-up as recommended.

## 2013-12-12 NOTE — Patient Instructions (Signed)

## 2013-12-13 ENCOUNTER — Encounter: Payer: Self-pay | Admitting: Internal Medicine

## 2013-12-13 ENCOUNTER — Ambulatory Visit (INDEPENDENT_AMBULATORY_CARE_PROVIDER_SITE_OTHER): Payer: Medicare Other | Admitting: Internal Medicine

## 2013-12-13 VITALS — BP 124/68 | HR 64 | Temp 97.9°F | Resp 16 | Ht 70.0 in | Wt 193.2 lb

## 2013-12-13 DIAGNOSIS — Z79899 Other long term (current) drug therapy: Secondary | ICD-10-CM

## 2013-12-13 DIAGNOSIS — E039 Hypothyroidism, unspecified: Secondary | ICD-10-CM

## 2013-12-13 DIAGNOSIS — R7309 Other abnormal glucose: Secondary | ICD-10-CM

## 2013-12-13 DIAGNOSIS — Z9181 History of falling: Secondary | ICD-10-CM

## 2013-12-13 DIAGNOSIS — E782 Mixed hyperlipidemia: Secondary | ICD-10-CM

## 2013-12-13 DIAGNOSIS — E559 Vitamin D deficiency, unspecified: Secondary | ICD-10-CM

## 2013-12-13 DIAGNOSIS — Z789 Other specified health status: Secondary | ICD-10-CM

## 2013-12-13 DIAGNOSIS — Z1212 Encounter for screening for malignant neoplasm of rectum: Secondary | ICD-10-CM

## 2013-12-13 DIAGNOSIS — Z23 Encounter for immunization: Secondary | ICD-10-CM

## 2013-12-13 DIAGNOSIS — Z125 Encounter for screening for malignant neoplasm of prostate: Secondary | ICD-10-CM

## 2013-12-13 DIAGNOSIS — I10 Essential (primary) hypertension: Secondary | ICD-10-CM

## 2013-12-13 LAB — MAGNESIUM: Magnesium: 2.1 mg/dL (ref 1.5–2.5)

## 2013-12-13 LAB — BASIC METABOLIC PANEL WITH GFR
BUN: 27 mg/dL — ABNORMAL HIGH (ref 6–23)
CHLORIDE: 100 meq/L (ref 96–112)
CO2: 27 mEq/L (ref 19–32)
Calcium: 9.3 mg/dL (ref 8.4–10.5)
Creat: 1.3 mg/dL (ref 0.50–1.35)
GFR, EST NON AFRICAN AMERICAN: 49 mL/min — AB
GFR, Est African American: 57 mL/min — ABNORMAL LOW
Glucose, Bld: 105 mg/dL — ABNORMAL HIGH (ref 70–99)
POTASSIUM: 4.6 meq/L (ref 3.5–5.3)
SODIUM: 137 meq/L (ref 135–145)

## 2013-12-13 LAB — HEPATIC FUNCTION PANEL
ALT: 19 U/L (ref 0–53)
AST: 23 U/L (ref 0–37)
Albumin: 4.4 g/dL (ref 3.5–5.2)
Alkaline Phosphatase: 60 U/L (ref 39–117)
BILIRUBIN INDIRECT: 0.6 mg/dL (ref 0.2–1.2)
BILIRUBIN TOTAL: 0.8 mg/dL (ref 0.2–1.2)
Bilirubin, Direct: 0.2 mg/dL (ref 0.0–0.3)
Total Protein: 6.5 g/dL (ref 6.0–8.3)

## 2013-12-13 LAB — LIPID PANEL
CHOL/HDL RATIO: 5.5 ratio
Cholesterol: 143 mg/dL (ref 0–200)
HDL: 26 mg/dL — ABNORMAL LOW (ref >39)
LDL CALC: 80 mg/dL (ref 0–99)
Triglycerides: 187 mg/dL — ABNORMAL HIGH (ref <150)
VLDL: 37 mg/dL (ref 0–40)

## 2013-12-13 LAB — HEMOGLOBIN A1C
HEMOGLOBIN A1C: 5.9 % — AB (ref <5.7)
Mean Plasma Glucose: 123 mg/dL — ABNORMAL HIGH (ref <117)

## 2013-12-14 ENCOUNTER — Other Ambulatory Visit: Payer: Self-pay | Admitting: Internal Medicine

## 2013-12-14 DIAGNOSIS — C44310 Basal cell carcinoma of skin of unspecified parts of face: Secondary | ICD-10-CM

## 2013-12-14 LAB — URINALYSIS, MICROSCOPIC ONLY
Bacteria, UA: NONE SEEN
CASTS: NONE SEEN
Crystals: NONE SEEN

## 2013-12-14 LAB — CBC WITH DIFFERENTIAL/PLATELET
BASOS ABS: 0.1 10*3/uL (ref 0.0–0.1)
Basophils Relative: 1 % (ref 0–1)
Eosinophils Absolute: 0.1 10*3/uL (ref 0.0–0.7)
Eosinophils Relative: 1 % (ref 0–5)
HEMATOCRIT: 33.5 % — AB (ref 39.0–52.0)
Hemoglobin: 10.9 g/dL — ABNORMAL LOW (ref 13.0–17.0)
LYMPHS PCT: 14 % (ref 12–46)
Lymphs Abs: 1.3 10*3/uL (ref 0.7–4.0)
MCH: 32 pg (ref 26.0–34.0)
MCHC: 32.5 g/dL (ref 30.0–36.0)
MCV: 98.2 fL (ref 78.0–100.0)
MONO ABS: 0.8 10*3/uL (ref 0.1–1.0)
Monocytes Relative: 9 % (ref 3–12)
NEUTROS ABS: 6.8 10*3/uL (ref 1.7–7.7)
Neutrophils Relative %: 75 % (ref 43–77)
PLATELETS: 401 10*3/uL — AB (ref 150–400)
RBC: 3.41 MIL/uL — ABNORMAL LOW (ref 4.22–5.81)
RDW: 25.6 % — ABNORMAL HIGH (ref 11.5–15.5)
WBC: 9 10*3/uL (ref 4.0–10.5)

## 2013-12-14 LAB — VITAMIN D 25 HYDROXY (VIT D DEFICIENCY, FRACTURES): Vit D, 25-Hydroxy: 65 ng/mL (ref 30–89)

## 2013-12-14 LAB — TSH: TSH: 4.844 u[IU]/mL — ABNORMAL HIGH (ref 0.350–4.500)

## 2013-12-14 LAB — MICROALBUMIN / CREATININE URINE RATIO
Creatinine, Urine: 43.9 mg/dL
MICROALB UR: 0.5 mg/dL (ref 0.00–1.89)
Microalb Creat Ratio: 11.4 mg/g (ref 0.0–30.0)

## 2013-12-14 LAB — INSULIN, FASTING: Insulin fasting, serum: 14 u[IU]/mL (ref 3–28)

## 2013-12-14 LAB — PSA: PSA: 1.29 ng/mL (ref <=4.00)

## 2013-12-23 ENCOUNTER — Other Ambulatory Visit (HOSPITAL_BASED_OUTPATIENT_CLINIC_OR_DEPARTMENT_OTHER): Payer: Medicare Other

## 2013-12-23 DIAGNOSIS — D649 Anemia, unspecified: Secondary | ICD-10-CM

## 2013-12-23 DIAGNOSIS — D473 Essential (hemorrhagic) thrombocythemia: Secondary | ICD-10-CM

## 2013-12-23 LAB — CBC WITH DIFFERENTIAL/PLATELET
BASO%: 1.8 % (ref 0.0–2.0)
Basophils Absolute: 0.2 10*3/uL — ABNORMAL HIGH (ref 0.0–0.1)
EOS%: 0.9 % (ref 0.0–7.0)
Eosinophils Absolute: 0.1 10*3/uL (ref 0.0–0.5)
HCT: 35.9 % — ABNORMAL LOW (ref 38.4–49.9)
HGB: 11.5 g/dL — ABNORMAL LOW (ref 13.0–17.1)
LYMPH%: 7.1 % — ABNORMAL LOW (ref 14.0–49.0)
MCH: 32.7 pg (ref 27.2–33.4)
MCHC: 32 g/dL (ref 32.0–36.0)
MCV: 102 fL — AB (ref 79.3–98.0)
MONO#: 1 10*3/uL — ABNORMAL HIGH (ref 0.1–0.9)
MONO%: 10.1 % (ref 0.0–14.0)
NEUT#: 7.9 10*3/uL — ABNORMAL HIGH (ref 1.5–6.5)
NEUT%: 80.1 % — AB (ref 39.0–75.0)
Platelets: 392 10*3/uL (ref 140–400)
RBC: 3.52 10*6/uL — ABNORMAL LOW (ref 4.20–5.82)
RDW: 24.3 % — AB (ref 11.0–14.6)
WBC: 9.8 10*3/uL (ref 4.0–10.3)
lymph#: 0.7 10*3/uL — ABNORMAL LOW (ref 0.9–3.3)
nRBC: 4 % — ABNORMAL HIGH (ref 0–0)

## 2013-12-23 LAB — MORPHOLOGY: PLT EST: ADEQUATE

## 2013-12-23 LAB — CHCC SMEAR

## 2014-01-03 ENCOUNTER — Other Ambulatory Visit (INDEPENDENT_AMBULATORY_CARE_PROVIDER_SITE_OTHER): Payer: Medicare Other

## 2014-01-03 DIAGNOSIS — Z1212 Encounter for screening for malignant neoplasm of rectum: Secondary | ICD-10-CM

## 2014-01-03 LAB — POC HEMOCCULT BLD/STL (HOME/3-CARD/SCREEN)
Card #2 Fecal Occult Blod, POC: NEGATIVE
Card #3 Fecal Occult Blood, POC: NEGATIVE
Fecal Occult Blood, POC: NEGATIVE

## 2014-01-29 ENCOUNTER — Other Ambulatory Visit: Payer: Self-pay | Admitting: Oncology

## 2014-02-24 ENCOUNTER — Other Ambulatory Visit: Payer: Medicare Other

## 2014-02-24 ENCOUNTER — Ambulatory Visit: Payer: Medicare Other | Admitting: Oncology

## 2014-02-24 ENCOUNTER — Ambulatory Visit: Payer: Medicare Other | Admitting: Internal Medicine

## 2014-02-28 ENCOUNTER — Other Ambulatory Visit: Payer: Medicare Other

## 2014-02-28 ENCOUNTER — Telehealth: Payer: Self-pay | Admitting: *Deleted

## 2014-02-28 ENCOUNTER — Ambulatory Visit: Payer: Medicare Other | Admitting: Oncology

## 2014-02-28 NOTE — Telephone Encounter (Signed)
Called pt to follow up on missed appt today. He stated he forgot about appt. Pt requests to reschedule. Orders sent to schedulers for lab within 1 week.

## 2014-03-01 ENCOUNTER — Telehealth: Payer: Self-pay | Admitting: Oncology

## 2014-03-01 NOTE — Telephone Encounter (Signed)
s.w. pt and advised on June and July appt...pt ok adn aware

## 2014-03-03 ENCOUNTER — Other Ambulatory Visit (HOSPITAL_BASED_OUTPATIENT_CLINIC_OR_DEPARTMENT_OTHER): Payer: Medicare Other

## 2014-03-03 DIAGNOSIS — D473 Essential (hemorrhagic) thrombocythemia: Secondary | ICD-10-CM

## 2014-03-03 LAB — CBC WITH DIFFERENTIAL/PLATELET
BASO%: 1.2 % (ref 0.0–2.0)
BASOS ABS: 0.1 10*3/uL (ref 0.0–0.1)
EOS ABS: 0.1 10*3/uL (ref 0.0–0.5)
EOS%: 1.2 % (ref 0.0–7.0)
HCT: 32 % — ABNORMAL LOW (ref 38.4–49.9)
HEMOGLOBIN: 10 g/dL — AB (ref 13.0–17.1)
LYMPH%: 9.6 % — ABNORMAL LOW (ref 14.0–49.0)
MCH: 32.6 pg (ref 27.2–33.4)
MCHC: 31.3 g/dL — ABNORMAL LOW (ref 32.0–36.0)
MCV: 104.2 fL — AB (ref 79.3–98.0)
MONO#: 0.7 10*3/uL (ref 0.1–0.9)
MONO%: 9.6 % (ref 0.0–14.0)
NEUT%: 78.4 % — ABNORMAL HIGH (ref 39.0–75.0)
NEUTROS ABS: 5.8 10*3/uL (ref 1.5–6.5)
Platelets: 344 10*3/uL (ref 140–400)
RBC: 3.07 10*6/uL — ABNORMAL LOW (ref 4.20–5.82)
RDW: 24.5 % — AB (ref 11.0–14.6)
WBC: 7.4 10*3/uL (ref 4.0–10.3)
lymph#: 0.7 10*3/uL — ABNORMAL LOW (ref 0.9–3.3)

## 2014-03-03 LAB — TECHNOLOGIST REVIEW

## 2014-03-17 ENCOUNTER — Encounter: Payer: Self-pay | Admitting: Internal Medicine

## 2014-03-17 ENCOUNTER — Ambulatory Visit (INDEPENDENT_AMBULATORY_CARE_PROVIDER_SITE_OTHER): Payer: Medicare Other | Admitting: Internal Medicine

## 2014-03-17 ENCOUNTER — Ambulatory Visit: Payer: Self-pay | Admitting: Physician Assistant

## 2014-03-17 VITALS — BP 100/64 | HR 64 | Temp 97.5°F | Resp 16 | Ht 70.0 in | Wt 190.6 lb

## 2014-03-17 DIAGNOSIS — I10 Essential (primary) hypertension: Secondary | ICD-10-CM

## 2014-03-17 DIAGNOSIS — R7309 Other abnormal glucose: Secondary | ICD-10-CM

## 2014-03-17 DIAGNOSIS — E559 Vitamin D deficiency, unspecified: Secondary | ICD-10-CM

## 2014-03-17 DIAGNOSIS — E782 Mixed hyperlipidemia: Secondary | ICD-10-CM

## 2014-03-17 DIAGNOSIS — Z79899 Other long term (current) drug therapy: Secondary | ICD-10-CM

## 2014-03-17 LAB — CBC WITH DIFFERENTIAL/PLATELET
Basophils Absolute: 0.1 10*3/uL (ref 0.0–0.1)
Basophils Relative: 1 % (ref 0–1)
EOS PCT: 1 % (ref 0–5)
Eosinophils Absolute: 0.1 10*3/uL (ref 0.0–0.7)
HEMATOCRIT: 32.9 % — AB (ref 39.0–52.0)
HEMOGLOBIN: 10.9 g/dL — AB (ref 13.0–17.0)
LYMPHS ABS: 1.2 10*3/uL (ref 0.7–4.0)
Lymphocytes Relative: 13 % (ref 12–46)
MCH: 33.3 pg (ref 26.0–34.0)
MCHC: 33.1 g/dL (ref 30.0–36.0)
MCV: 100.6 fL — AB (ref 78.0–100.0)
MONOS PCT: 7 % (ref 3–12)
Monocytes Absolute: 0.6 10*3/uL (ref 0.1–1.0)
Neutro Abs: 7 10*3/uL (ref 1.7–7.7)
Neutrophils Relative %: 78 % — ABNORMAL HIGH (ref 43–77)
Platelets: 447 10*3/uL — ABNORMAL HIGH (ref 150–400)
RBC: 3.27 MIL/uL — ABNORMAL LOW (ref 4.22–5.81)
RDW: 26.1 % — ABNORMAL HIGH (ref 11.5–15.5)
WBC: 9 10*3/uL (ref 4.0–10.5)

## 2014-03-17 LAB — HEMOGLOBIN A1C
Hgb A1c MFr Bld: 5.6 % (ref <5.7)
MEAN PLASMA GLUCOSE: 114 mg/dL (ref <117)

## 2014-03-17 LAB — BASIC METABOLIC PANEL WITH GFR
BUN: 27 mg/dL — ABNORMAL HIGH (ref 6–23)
CHLORIDE: 103 meq/L (ref 96–112)
CO2: 26 meq/L (ref 19–32)
CREATININE: 1.17 mg/dL (ref 0.50–1.35)
Calcium: 8.9 mg/dL (ref 8.4–10.5)
GFR, Est African American: 64 mL/min
GFR, Est Non African American: 55 mL/min — ABNORMAL LOW
Glucose, Bld: 88 mg/dL (ref 70–99)
POTASSIUM: 4.8 meq/L (ref 3.5–5.3)
Sodium: 138 mEq/L (ref 135–145)

## 2014-03-17 LAB — HEPATIC FUNCTION PANEL
ALBUMIN: 4.5 g/dL (ref 3.5–5.2)
ALT: 21 U/L (ref 0–53)
AST: 24 U/L (ref 0–37)
Alkaline Phosphatase: 57 U/L (ref 39–117)
Bilirubin, Direct: 0.2 mg/dL (ref 0.0–0.3)
Indirect Bilirubin: 0.5 mg/dL (ref 0.2–1.2)
TOTAL PROTEIN: 6.6 g/dL (ref 6.0–8.3)
Total Bilirubin: 0.7 mg/dL (ref 0.2–1.2)

## 2014-03-17 LAB — LIPID PANEL
Cholesterol: 143 mg/dL (ref 0–200)
HDL: 26 mg/dL — AB (ref >39)
LDL CALC: 88 mg/dL (ref 0–99)
Total CHOL/HDL Ratio: 5.5 Ratio
Triglycerides: 145 mg/dL (ref <150)
VLDL: 29 mg/dL (ref 0–40)

## 2014-03-17 LAB — MAGNESIUM: Magnesium: 2.4 mg/dL (ref 1.5–2.5)

## 2014-03-17 LAB — TSH: TSH: 2.994 u[IU]/mL (ref 0.350–4.500)

## 2014-03-17 NOTE — Patient Instructions (Signed)

## 2014-03-17 NOTE — Progress Notes (Signed)
Patient ID: Jack Schultz, male   DOB: 1924-06-19, 80 y.o.   MRN: [id number]   This very nice 80 y.o.MWM presents for 3 month follow up with Hypertension, Hyperlipidemia, Pre-Diabetes and Vitamin D Deficiency. Patient was Dx'd with Primary Thrombocythemia in 2002 and has been followed by Dr Benay Spice since.   HTN predates since 1995. BP has been controlled at home. Today's BP: 100/64 mmHg. Patient denies any cardiac type chest pain, palpitations, dyspnea/orthopnea/PND, dizziness, claudication, or dependent edema.   Hyperlipidemia is controlled with diet & meds. Patient denies myalgias or other med SE's. Current Lipids are at goal. Lab Results  Component Value Date   CHOL 143 03/17/2014   HDL 26* 03/17/2014   LDLCALC 88 03/17/2014   TRIG 145 03/17/2014   CHOLHDL 5.5 03/17/2014    Also, the patient has history of PreDiabetes since 04/2012 with A1c 6.0% in Sept 2014  and last A1c was 5.9% in Mar 2015. Patient denies any symptoms of reactive hypoglycemia, diabetic polys, paresthesias or visual blurring.   Further, Patient has history of Vitamin D Deficiency of 20 in 2008 and last vitamin D was 65 in Mar 2015. Patient supplements vitamin D without any suspected side-effects.   Medication List   aspirin 81 MG tablet  Take 81 mg by mouth daily.     atenolol 50 MG tablet  Take 50 mg by mouth.     ferrous sulfate 325 (65 FE) MG tablet  Take 325 mg by mouth daily with breakfast.     furosemide 40 MG tablet  Take 20 mg by mouth Daily.     hydroxyurea 500 MG capsule  TAKE 1 CAPSULE (500 MG TOTAL) BY MOUTH DAILY.     levothyroxine 50 MCG tablet  Take 50 mcg by mouth Daily.     Vitamin D 2000 UNITS tablet  Take 2,000 Units by mouth daily. Takes 2 bid        Allergies  Allergen Reactions  . Ace Inhibitors Cough   PMHx:   Past Medical History  Diagnosis Date  . Hypertension   . Hyperlipidemia   . Pre-diabetes   . Thyroid disease   . Thrombocytosis   . GERD (gastroesophageal reflux  disease)   . Vitamin D deficiency    FHx:    Reviewed / unchanged  SHx:    Reviewed / unchanged  Systems Review:  Constitutional: Denies fever, chills, wt changes, headaches, insomnia, fatigue, night sweats, change in appetite. Eyes: Denies redness, blurred vision, diplopia, discharge, itchy, watery eyes.  ENT: Denies discharge, congestion, post nasal drip, epistaxis, sore throat, earache, hearing loss, dental pain, tinnitus, vertigo, sinus pain, snoring.  CV: Denies chest pain, palpitations, irregular heartbeat, syncope, dyspnea, diaphoresis, orthopnea, PND, claudication or edema. Respiratory: denies cough, dyspnea, DOE, pleurisy, hoarseness, laryngitis, wheezing.  Gastrointestinal: Denies dysphagia, odynophagia, heartburn, reflux, water brash, abdominal pain or cramps, nausea, vomiting, bloating, diarrhea, constipation, hematemesis, melena, hematochezia  or hemorrhoids. Genitourinary: Denies dysuria, frequency, urgency, nocturia, hesitancy, discharge, hematuria or flank pain. Musculoskeletal: Denies arthralgias, myalgias, stiffness, jt. swelling, pain, limping or strain/sprain.  Skin: Denies pruritus, rash, hives, warts, acne, eczema or change in skin lesion(s). Neuro: No weakness, tremor, incoordination, spasms, paresthesia or pain. Psychiatric: Denies confusion, memory loss or sensory loss. Endo: Denies change in weight, skin or hair change.  Heme/Lymph: No excessive bleeding, bruising or enlarged lymph nodes. Exam:  BP 100/64  P 64  T 97.5 F    R 16  Ht 5\' 10"    Wt 190 lb 9.6  oz   BMI 27.35 kg/m2  Appears well nourished and in no distress. Eyes: PERRLA, EOMs, conjunctiva no swelling or erythema. Sinuses: No frontal/maxillary tenderness ENT/Mouth: EAC's clear, TM's nl w/o erythema, bulging. Nares clear w/o erythema, swelling, exudates. Oropharynx clear without erythema or exudates. Oral hygiene is good. Tongue normal, non obstructing. Hearing intact.  Neck: Supple. Thyroid nl.  Car 2+/2+ without bruits, nodes or JVD. Chest: Respirations nl with BS clear & equal w/o rales, rhonchi, wheezing or stridor.  Cor: Heart sounds normal w/ regular rate and rhythm without sig. murmurs, gallops, clicks, or rubs. Peripheral pulses normal and equal  without edema.  Abdomen: Soft & bowel sounds normal. Non-tender w/o guarding, rebound, hernias, masses, or organomegaly.  Lymphatics: Unremarkable.  Musculoskeletal: Full ROM all peripheral extremities, joint stability, 5/5 strength, and normal gait.  Skin: Warm, dry without exposed rashes, lesions or ecchymosis apparent.  Neuro: Cranial nerves intact, reflexes equal bilaterally. Sensory-motor testing grossly intact. Tendon reflexes grossly intact.  Pysch: Alert & oriented x 3. Insight and judgement nl & appropriate. No ideations. Assessment and Plan:  1. Hypertension - Continue monitor blood pressure at home. Continue diet/meds same.  2. Hyperlipidemia - Continue diet/meds, exercise,& lifestyle modifications. Continue monitor periodic cholesterol/liver & renal functions   3. Pre-diabetes- Continue diet, exercise, lifestyle modifications. Monitor appropriate labs.  4. Vitamin D Deficiency - Continue supplementation.  5. Primary Thrombocythemia  Recommended regular exercise, BP monitoring, weight control, and discussed med and SE's. Recommended labs to assess and monitor clinical status. Further disposition pending results of labs.

## 2014-03-18 LAB — VITAMIN D 25 HYDROXY (VIT D DEFICIENCY, FRACTURES): VIT D 25 HYDROXY: 69 ng/mL (ref 30–89)

## 2014-03-18 LAB — INSULIN, FASTING: INSULIN FASTING, SERUM: 6 u[IU]/mL (ref 3–28)

## 2014-03-28 ENCOUNTER — Other Ambulatory Visit: Payer: Self-pay | Admitting: Internal Medicine

## 2014-04-02 ENCOUNTER — Other Ambulatory Visit: Payer: Self-pay | Admitting: Oncology

## 2014-04-02 DIAGNOSIS — D473 Essential (hemorrhagic) thrombocythemia: Secondary | ICD-10-CM

## 2014-04-20 ENCOUNTER — Other Ambulatory Visit: Payer: Self-pay | Admitting: *Deleted

## 2014-04-21 ENCOUNTER — Other Ambulatory Visit: Payer: Medicare Other

## 2014-04-21 ENCOUNTER — Telehealth: Payer: Self-pay | Admitting: Oncology

## 2014-04-21 ENCOUNTER — Ambulatory Visit (HOSPITAL_BASED_OUTPATIENT_CLINIC_OR_DEPARTMENT_OTHER): Payer: Medicare Other

## 2014-04-21 ENCOUNTER — Ambulatory Visit (HOSPITAL_BASED_OUTPATIENT_CLINIC_OR_DEPARTMENT_OTHER): Payer: Medicare Other | Admitting: Oncology

## 2014-04-21 VITALS — BP 142/72 | HR 70 | Temp 97.8°F | Resp 18 | Ht 70.0 in | Wt 188.3 lb

## 2014-04-21 DIAGNOSIS — D473 Essential (hemorrhagic) thrombocythemia: Secondary | ICD-10-CM

## 2014-04-21 DIAGNOSIS — Z862 Personal history of diseases of the blood and blood-forming organs and certain disorders involving the immune mechanism: Secondary | ICD-10-CM

## 2014-04-21 LAB — CBC WITH DIFFERENTIAL/PLATELET
BASO%: 1 % (ref 0.0–2.0)
Basophils Absolute: 0.1 10*3/uL (ref 0.0–0.1)
EOS%: 0.6 % (ref 0.0–7.0)
Eosinophils Absolute: 0.1 10*3/uL (ref 0.0–0.5)
HCT: 32.5 % — ABNORMAL LOW (ref 38.4–49.9)
HGB: 10.4 g/dL — ABNORMAL LOW (ref 13.0–17.1)
LYMPH%: 9.7 % — AB (ref 14.0–49.0)
MCH: 33.7 pg — ABNORMAL HIGH (ref 27.2–33.4)
MCHC: 31.9 g/dL — ABNORMAL LOW (ref 32.0–36.0)
MCV: 105.7 fL — ABNORMAL HIGH (ref 79.3–98.0)
MONO#: 0.7 10*3/uL (ref 0.1–0.9)
MONO%: 7.2 % (ref 0.0–14.0)
NEUT#: 7.8 10*3/uL — ABNORMAL HIGH (ref 1.5–6.5)
NEUT%: 81.5 % — ABNORMAL HIGH (ref 39.0–75.0)
PLATELETS: 466 10*3/uL — AB (ref 140–400)
RBC: 3.08 10*6/uL — AB (ref 4.20–5.82)
RDW: 27 % — ABNORMAL HIGH (ref 11.0–14.6)
WBC: 9.6 10*3/uL (ref 4.0–10.3)
lymph#: 0.9 10*3/uL (ref 0.9–3.3)

## 2014-04-21 LAB — TECHNOLOGIST REVIEW

## 2014-04-21 LAB — CHCC SMEAR

## 2014-04-21 NOTE — Telephone Encounter (Signed)
gv and printed appt scehd adn avs for pt for Sept Dec adn Jan

## 2014-04-21 NOTE — Progress Notes (Signed)
  [facility] OFFICE PROGRESS NOTE   Diagnosis: Essential thrombocytosis  INTERVAL HISTORY:   Jack Schultz returns as scheduled. He denies bleeding. He is currently taking hydroxyurea at a dose of 500 mg daily. His only complaint is malaise.  Objective:  Vital signs in last 24 hours:  Blood pressure 142/72, pulse 70, temperature 97.8 F (36.6 C), temperature source Oral, resp. rate 18, height 5\' 10"  (1.778 m), weight 188 lb 4.8 oz (85.412 kg).    HEENT: No thrush or ulcer Resp: Lungs clear bilaterally Cardio: Regular rate and rhythm GI: No hepatosplenomegaly Vascular: No leg edema, right lower leg is larger than left side   Lab Results:  Lab Results  Component Value Date   WBC 9.6 04/21/2014   HGB 10.4* 04/21/2014   HCT 32.5* 04/21/2014   MCV 105.7* 04/21/2014   PLT 466* 04/21/2014   NEUTROABS 7.8* 04/21/2014       Medications: I have reviewed the patient's current medications.  Assessment/Plan: 1. Essential thrombocytosis. He continues hydroxyurea therapy. The platelet count is stable. 2. History of a mild macrocytic anemia. Most likely secondary to hydroxyurea therapy. The hemoglobin is stable.  3. history of epistaxis-he reports this occurs once every 1-2 years. Related to the myeloproliferative disorder and aspirin therapy?     Disposition:  He is stable from a hematologic standpoint. No clinical evidence for progression to leukemia, though a few blasts have been noted on the blood smear. He will continue hydroxyurea at the current dose of 500 mg daily. Jack Schultz will be scheduled for a lab visit in 2 months and 4 months. He will return for an office visit in 6 months.  He reports his son has been diagnosed with splenomegaly and was initially placed on hydroxyurea. He is now being followed at [place], [place], [place]  04/21/2014  1:53 PM

## 2014-05-11 ENCOUNTER — Other Ambulatory Visit: Payer: Self-pay | Admitting: Internal Medicine

## 2014-06-03 ENCOUNTER — Other Ambulatory Visit: Payer: Self-pay | Admitting: Internal Medicine

## 2014-06-21 ENCOUNTER — Encounter: Payer: Self-pay | Admitting: Internal Medicine

## 2014-06-21 ENCOUNTER — Ambulatory Visit (INDEPENDENT_AMBULATORY_CARE_PROVIDER_SITE_OTHER): Payer: Medicare Other | Admitting: Internal Medicine

## 2014-06-21 VITALS — BP 114/62 | HR 64 | Temp 97.9°F | Resp 16 | Ht 70.0 in | Wt 187.7 lb

## 2014-06-21 DIAGNOSIS — R7309 Other abnormal glucose: Secondary | ICD-10-CM

## 2014-06-21 DIAGNOSIS — E559 Vitamin D deficiency, unspecified: Secondary | ICD-10-CM

## 2014-06-21 DIAGNOSIS — I10 Essential (primary) hypertension: Secondary | ICD-10-CM

## 2014-06-21 DIAGNOSIS — Z79899 Other long term (current) drug therapy: Secondary | ICD-10-CM

## 2014-06-21 DIAGNOSIS — Z23 Encounter for immunization: Secondary | ICD-10-CM

## 2014-06-21 DIAGNOSIS — E782 Mixed hyperlipidemia: Secondary | ICD-10-CM

## 2014-06-21 NOTE — Patient Instructions (Signed)

## 2014-06-21 NOTE — Progress Notes (Signed)
Patient ID: Jack Schultz, male   DOB: June 05, 1924, 80 y.o.   MRN: [id number]   This very nice 80 y.o.MWM presents for 3 month follow up with Hypertension, Hyperlipidemia, Pre-Diabetes and Vitamin D Deficiency. Patient has been treated by Dr Benay Spice since 2002 for Primary Thrombocytosis.    Patient is treated for HTN & BP has been controlled at home. Today's BP: 114/62 mmHg. Patient has had no complaints of any cardiac type chest pain, palpitations, dyspnea/orthopnea/PND, dizziness, claudication, or dependent edema.   Hyperlipidemia is controlled with diet & meds. Patient denies myalgias or other med SE's. Last Lipids were Total Chol 143; HDL  26*; LDL  88; Trig 145 on 03/17/2014.   Also, the patient has history of  PreDiabetes and has had no symptoms of reactive hypoglycemia, diabetic polys, paresthesias or visual blurring.  Last A1c was  5.6% on  03/17/2014.    Further, the patient also has history of Vitamin D Deficiency and supplements vitamin D without any suspected side-effects. Last vitamin D was  69 on 03/17/2014.   Medication List   aspirin 81 MG tablet  Take 81 mg by mouth daily.     atenolol 50 MG tablet  Commonly known as:  TENORMIN  TAKE 1 TABLET AT BEDTIME     ferrous sulfate 325 (65 FE) MG tablet  Take 325 mg by mouth daily with breakfast.     furosemide 40 MG tablet  Commonly known as:  LASIX  TAKE 1 TABLET EVERY DAY     hydroxyurea 500 MG capsule  Commonly known as:  HYDREA  TAKE 1 CAPSULE EVERY DAY     levothyroxine 50 MCG tablet  Commonly known as:  SYNTHROID, LEVOTHROID  TAKE 1 TABLET BY MOUTH EVERY DAY     Vitamin D 2000 UNITS tablet  Take 2,000 Units by mouth daily. Takes 2 bid     Allergies  Allergen Reactions  . Ace Inhibitors Cough   PMHx:   Past Medical History  Diagnosis Date  . Hypertension   . Hyperlipidemia   . Pre-diabetes   . Thyroid disease   . Thrombocytosis   . GERD (gastroesophageal reflux disease)   . Vitamin D deficiency     FHx:    Reviewed / unchanged  SHx:    Reviewed / unchanged   Systems Review:  Constitutional: Denies fever, chills, wt changes, headaches, insomnia, fatigue, night sweats, change in appetite. Eyes: Denies redness, blurred vision, diplopia, discharge, itchy, watery eyes.  ENT: Denies discharge, congestion, post nasal drip, epistaxis, sore throat, earache, hearing loss, dental pain, tinnitus, vertigo, sinus pain, snoring.  CV: Denies chest pain, palpitations, irregular heartbeat, syncope, dyspnea, diaphoresis, orthopnea, PND, claudication or edema. Respiratory: denies cough, dyspnea, DOE, pleurisy, hoarseness, laryngitis, wheezing.  Gastrointestinal: Denies dysphagia, odynophagia, heartburn, reflux, water brash, abdominal pain or cramps, nausea, vomiting, bloating, diarrhea, constipation, hematemesis, melena, hematochezia  or hemorrhoids. Genitourinary: Denies dysuria, frequency, urgency, nocturia, hesitancy, discharge, hematuria or flank pain. Musculoskeletal: Denies arthralgias, myalgias, stiffness, jt. swelling, pain, limping or strain/sprain.  Skin: Denies pruritus, rash, hives, warts, acne, eczema or change in skin lesion(s). Neuro: No weakness, tremor, incoordination, spasms, paresthesia or pain. Psychiatric: Denies confusion, memory loss or sensory loss. Endo: Denies change in weight, skin or hair change.  Heme/Lymph: No excessive bleeding, bruising or enlarged lymph nodes.  Exam:  BP 114/62  Pulse 64  Temp(Src) 97.9 F (36.6 C) (Temporal)  Resp 16  Ht 5\' 10"  (1.778 m)  Wt 187 lb 11.2 oz (85.14 kg)  BMI 26.93 kg/m2  Appears well nourished and in no distress. Eyes: PERRLA, EOMs, conjunctiva no swelling or erythema. Sinuses: No frontal/maxillary tenderness ENT/Mouth: EAC's clear, TM's nl w/o erythema, bulging. Nares clear w/o erythema, swelling, exudates. Oropharynx clear without erythema or exudates. Oral hygiene is good. Tongue normal, non obstructing. Hearing intact.   Neck: Supple. Thyroid nl. Car 2+/2+ without bruits, nodes or JVD. Chest: Respirations nl with BS clear & equal w/o rales, rhonchi, wheezing or stridor.  Cor: Heart sounds normal w/ regular rate and rhythm without sig. murmurs, gallops, clicks, or rubs. Peripheral pulses normal and equal  without edema.  Abdomen: Soft & bowel sounds normal. Non-tender w/o guarding, rebound, hernias, masses, or organomegaly.  Lymphatics: Unremarkable.  Musculoskeletal: Full ROM all peripheral extremities, joint stability, 5/5 strength, and normal gait.  Skin: Warm, dry without exposed rashes, lesions or ecchymosis apparent.  Neuro: Cranial nerves intact, reflexes equal bilaterally. Sensory-motor testing grossly intact. Tendon reflexes grossly intact.  Pysch: Alert & oriented x 3.  Insight and judgement nl & appropriate. No ideations.  Assessment and Plan:  1. Hypertension - Continue monitor blood pressure at home. Continue diet/meds same.  2. Hyperlipidemia - Continue diet/meds, exercise,& lifestyle modifications. Continue monitor periodic cholesterol/liver & renal functions   3.  Pre-Diabetes - Continue diet, exercise, lifestyle modifications. Monitor appropriate labs.  4. Vitamin D Deficiency - Continue supplementation.  5. Primary Thrombocytosis -  Continue f/u w/Dr Benay Spice   Recommended regular exercise, BP monitoring, weight control, and discussed med and SE's. Recommended labs to assess and monitor clinical status. Further disposition pending results of labs.

## 2014-06-22 ENCOUNTER — Telehealth: Payer: Self-pay | Admitting: *Deleted

## 2014-06-22 ENCOUNTER — Other Ambulatory Visit (HOSPITAL_BASED_OUTPATIENT_CLINIC_OR_DEPARTMENT_OTHER): Payer: Medicare Other

## 2014-06-22 DIAGNOSIS — D473 Essential (hemorrhagic) thrombocythemia: Secondary | ICD-10-CM

## 2014-06-22 LAB — CBC WITH DIFFERENTIAL/PLATELET
BASO%: 1.3 % (ref 0.0–2.0)
Basophils Absolute: 0.1 10*3/uL (ref 0.0–0.1)
EOS%: 0.9 % (ref 0.0–7.0)
Eosinophils Absolute: 0.1 10*3/uL (ref 0.0–0.5)
HCT: 32.9 % — ABNORMAL LOW (ref 39.0–52.0)
HCT: 33.2 % — ABNORMAL LOW (ref 38.4–49.9)
HGB: 10.7 g/dL — ABNORMAL LOW (ref 13.0–17.1)
Hemoglobin: 10.7 g/dL — ABNORMAL LOW (ref 13.0–17.0)
LYMPH%: 6.7 % — AB (ref 14.0–49.0)
MCH: 32.6 pg (ref 26.0–34.0)
MCH: 33.3 pg (ref 27.2–33.4)
MCHC: 32.5 g/dL (ref 30.0–36.0)
MCHC: 32.2 g/dL (ref 32.0–36.0)
MCV: 100.3 fL — ABNORMAL HIGH (ref 78.0–100.0)
MCV: 103.4 fL — ABNORMAL HIGH (ref 79.3–98.0)
MONO#: 0.7 10*3/uL (ref 0.1–0.9)
MONO%: 8.6 % (ref 0.0–14.0)
NEUT#: 7 10*3/uL — ABNORMAL HIGH (ref 1.5–6.5)
NEUT%: 82.5 % — ABNORMAL HIGH (ref 39.0–75.0)
NRBC: 3 % — AB (ref 0–0)
PLATELETS: 402 10*3/uL — AB (ref 150–400)
Platelets: 369 10*3/uL (ref 140–400)
RBC: 3.28 MIL/uL — AB (ref 4.22–5.81)
RBC: 3.21 10*6/uL — AB (ref 4.20–5.82)
RDW: 25.9 % — ABNORMAL HIGH (ref 11.5–15.5)
RDW: 24.2 % — AB (ref 11.0–14.6)
WBC: 10.5 10*3/uL (ref 4.0–10.5)
WBC: 8.5 10*3/uL (ref 4.0–10.3)
lymph#: 0.6 10*3/uL — ABNORMAL LOW (ref 0.9–3.3)

## 2014-06-22 LAB — PATHOLOGIST SMEAR REVIEW

## 2014-06-22 LAB — HEPATIC FUNCTION PANEL
ALBUMIN: 4.4 g/dL (ref 3.5–5.2)
ALT: 16 U/L (ref 0–53)
AST: 19 U/L (ref 0–37)
Alkaline Phosphatase: 61 U/L (ref 39–117)
Bilirubin, Direct: 0.2 mg/dL (ref 0.0–0.3)
Indirect Bilirubin: 0.6 mg/dL (ref 0.2–1.2)
TOTAL PROTEIN: 6.5 g/dL (ref 6.0–8.3)
Total Bilirubin: 0.8 mg/dL (ref 0.2–1.2)

## 2014-06-22 LAB — HEMOGLOBIN A1C
Hgb A1c MFr Bld: 5.6 % (ref <5.7)
MEAN PLASMA GLUCOSE: 114 mg/dL (ref <117)

## 2014-06-22 LAB — BASIC METABOLIC PANEL WITH GFR
BUN: 26 mg/dL — ABNORMAL HIGH (ref 6–23)
CO2: 28 mEq/L (ref 19–32)
CREATININE: 1.27 mg/dL (ref 0.50–1.35)
Calcium: 9 mg/dL (ref 8.4–10.5)
Chloride: 101 mEq/L (ref 96–112)
GFR, EST AFRICAN AMERICAN: 58 mL/min — AB
GFR, EST NON AFRICAN AMERICAN: 50 mL/min — AB
Glucose, Bld: 100 mg/dL — ABNORMAL HIGH (ref 70–99)
Potassium: 4.6 mEq/L (ref 3.5–5.3)
Sodium: 137 mEq/L (ref 135–145)

## 2014-06-22 LAB — TECHNOLOGIST REVIEW

## 2014-06-22 LAB — INSULIN, FASTING: INSULIN FASTING, SERUM: 4.8 u[IU]/mL (ref 2.0–19.6)

## 2014-06-22 LAB — LIPID PANEL
CHOLESTEROL: 137 mg/dL (ref 0–200)
HDL: 25 mg/dL — ABNORMAL LOW (ref >39)
LDL Cholesterol: 85 mg/dL (ref 0–99)
TRIGLYCERIDES: 136 mg/dL (ref <150)
Total CHOL/HDL Ratio: 5.5 Ratio
VLDL: 27 mg/dL (ref 0–40)

## 2014-06-22 LAB — MAGNESIUM: Magnesium: 2.1 mg/dL (ref 1.5–2.5)

## 2014-06-22 LAB — VITAMIN D 25 HYDROXY (VIT D DEFICIENCY, FRACTURES): VIT D 25 HYDROXY: 80 ng/mL (ref 30–89)

## 2014-06-22 LAB — TSH: TSH: 3.724 u[IU]/mL (ref 0.350–4.500)

## 2014-06-22 NOTE — Telephone Encounter (Signed)
Called pt with lab results: Stable per Dr. Benay Spice. Continue same dose of Hydrea. Pt voiced understanding.

## 2014-06-24 ENCOUNTER — Telehealth: Payer: Self-pay | Admitting: *Deleted

## 2014-06-24 NOTE — Telephone Encounter (Signed)
Message copied by Patton Salles on Fri Jun 24, 2014  9:28 AM ------      Message from: Tania Ade      Created: Fri Jun 24, 2014  9:13 AM                   ----- Message -----         From: Ladell Pier, MD         Sent: 06/23/2014   7:14 PM           To: Tania Ade, RN, Ludwig Lean, RN, #            Please call patient, cbc is stable, continue hydrea-same dose, f/u as scheduled ------

## 2014-08-22 ENCOUNTER — Other Ambulatory Visit: Payer: Self-pay | Admitting: *Deleted

## 2014-08-22 DIAGNOSIS — D473 Essential (hemorrhagic) thrombocythemia: Secondary | ICD-10-CM

## 2014-08-23 ENCOUNTER — Other Ambulatory Visit (HOSPITAL_BASED_OUTPATIENT_CLINIC_OR_DEPARTMENT_OTHER): Payer: Medicare Other

## 2014-08-23 DIAGNOSIS — D473 Essential (hemorrhagic) thrombocythemia: Secondary | ICD-10-CM

## 2014-08-23 LAB — CBC WITH DIFFERENTIAL/PLATELET
BASO%: 0.7 % (ref 0.0–2.0)
Basophils Absolute: 0.1 10*3/uL (ref 0.0–0.1)
EOS%: 0.6 % (ref 0.0–7.0)
Eosinophils Absolute: 0.1 10*3/uL (ref 0.0–0.5)
HCT: 32.8 % — ABNORMAL LOW (ref 38.4–49.9)
HEMOGLOBIN: 10.4 g/dL — AB (ref 13.0–17.1)
LYMPH#: 0.6 10*3/uL — AB (ref 0.9–3.3)
LYMPH%: 7.2 % — ABNORMAL LOW (ref 14.0–49.0)
MCH: 33 pg (ref 27.2–33.4)
MCHC: 31.7 g/dL — ABNORMAL LOW (ref 32.0–36.0)
MCV: 104.1 fL — ABNORMAL HIGH (ref 79.3–98.0)
MONO#: 0.6 10*3/uL (ref 0.1–0.9)
MONO%: 6.6 % (ref 0.0–14.0)
NEUT#: 7.3 10*3/uL — ABNORMAL HIGH (ref 1.5–6.5)
NEUT%: 84.9 % — AB (ref 39.0–75.0)
Platelets: 485 10*3/uL — ABNORMAL HIGH (ref 140–400)
RBC: 3.15 10*6/uL — ABNORMAL LOW (ref 4.20–5.82)
RDW: 24.5 % — AB (ref 11.0–14.6)
WBC: 8.6 10*3/uL (ref 4.0–10.3)
nRBC: 2 % — ABNORMAL HIGH (ref 0–0)

## 2014-08-23 LAB — TECHNOLOGIST REVIEW

## 2014-08-24 ENCOUNTER — Telehealth: Payer: Self-pay | Admitting: *Deleted

## 2014-08-24 NOTE — Telephone Encounter (Signed)
Called pt with instructions to continue Hydrea 500 mg daily. HGB stable and PLT are OK. He voiced understanding.

## 2014-08-24 NOTE — Telephone Encounter (Signed)
-----   Message from Ladell Pier, MD sent at 08/23/2014  1:49 PM EST ----- Please call patient, platelets ok, hb stable, cont. Hydrea same dose

## 2014-09-20 ENCOUNTER — Encounter: Payer: Self-pay | Admitting: Physician Assistant

## 2014-09-20 ENCOUNTER — Ambulatory Visit (INDEPENDENT_AMBULATORY_CARE_PROVIDER_SITE_OTHER): Payer: Medicare Other | Admitting: Physician Assistant

## 2014-09-20 ENCOUNTER — Other Ambulatory Visit: Payer: Self-pay | Admitting: Internal Medicine

## 2014-09-20 ENCOUNTER — Other Ambulatory Visit: Payer: Self-pay | Admitting: Oncology

## 2014-09-20 VITALS — BP 120/68 | HR 72 | Temp 97.7°F | Resp 16 | Ht 70.0 in | Wt 185.0 lb

## 2014-09-20 DIAGNOSIS — Z1331 Encounter for screening for depression: Secondary | ICD-10-CM

## 2014-09-20 DIAGNOSIS — E782 Mixed hyperlipidemia: Secondary | ICD-10-CM

## 2014-09-20 DIAGNOSIS — Z0001 Encounter for general adult medical examination with abnormal findings: Secondary | ICD-10-CM

## 2014-09-20 DIAGNOSIS — R7303 Prediabetes: Secondary | ICD-10-CM

## 2014-09-20 DIAGNOSIS — D473 Essential (hemorrhagic) thrombocythemia: Secondary | ICD-10-CM

## 2014-09-20 DIAGNOSIS — E559 Vitamin D deficiency, unspecified: Secondary | ICD-10-CM

## 2014-09-20 DIAGNOSIS — E039 Hypothyroidism, unspecified: Secondary | ICD-10-CM

## 2014-09-20 DIAGNOSIS — Z9181 History of falling: Secondary | ICD-10-CM

## 2014-09-20 DIAGNOSIS — Z23 Encounter for immunization: Secondary | ICD-10-CM

## 2014-09-20 DIAGNOSIS — I10 Essential (primary) hypertension: Secondary | ICD-10-CM

## 2014-09-20 DIAGNOSIS — Z79899 Other long term (current) drug therapy: Secondary | ICD-10-CM

## 2014-09-20 DIAGNOSIS — R6889 Other general symptoms and signs: Secondary | ICD-10-CM

## 2014-09-20 DIAGNOSIS — R7309 Other abnormal glucose: Secondary | ICD-10-CM

## 2014-09-20 LAB — BASIC METABOLIC PANEL WITH GFR
BUN: 22 mg/dL (ref 6–23)
CO2: 26 mEq/L (ref 19–32)
Calcium: 9.2 mg/dL (ref 8.4–10.5)
Chloride: 104 mEq/L (ref 96–112)
Creat: 1.16 mg/dL (ref 0.50–1.35)
GFR, EST AFRICAN AMERICAN: 65 mL/min
GFR, EST NON AFRICAN AMERICAN: 56 mL/min — AB
Glucose, Bld: 90 mg/dL (ref 70–99)
POTASSIUM: 4.5 meq/L (ref 3.5–5.3)
SODIUM: 137 meq/L (ref 135–145)

## 2014-09-20 LAB — HEPATIC FUNCTION PANEL
ALT: 16 U/L (ref 0–53)
AST: 19 U/L (ref 0–37)
Albumin: 4.1 g/dL (ref 3.5–5.2)
Alkaline Phosphatase: 60 U/L (ref 39–117)
BILIRUBIN INDIRECT: 0.5 mg/dL (ref 0.2–1.2)
Bilirubin, Direct: 0.1 mg/dL (ref 0.0–0.3)
TOTAL PROTEIN: 6.1 g/dL (ref 6.0–8.3)
Total Bilirubin: 0.6 mg/dL (ref 0.2–1.2)

## 2014-09-20 LAB — MAGNESIUM: MAGNESIUM: 2.1 mg/dL (ref 1.5–2.5)

## 2014-09-20 LAB — LIPID PANEL
Cholesterol: 132 mg/dL (ref 0–200)
HDL: 18 mg/dL — AB (ref >39)
LDL Cholesterol: 53 mg/dL (ref 0–99)
Total CHOL/HDL Ratio: 7.3 Ratio
Triglycerides: 305 mg/dL — ABNORMAL HIGH (ref <150)
VLDL: 61 mg/dL — AB (ref 0–40)

## 2014-09-20 NOTE — Progress Notes (Signed)
MEDICARE ANNUAL WELLNESS VISIT AND FOLLOW UP Assessment:   1. Essential hypertension - continue medications, DASH diet, exercise and monitor at home. Call if greater than 130/80.  - BASIC METABOLIC PANEL WITH GFR - Hepatic function panel  2. Hypothyroidism, unspecified hypothyroidism type - TSH  3. Essential thrombocytosis Continue follow up with Dr. [name] - CBC with Differential  4. Hyperlipidemia -continue medications, check lipids, decrease fatty foods, increase activity.  - Lipid panel  5. Prediabetes Discussed general issues about diabetes pathophysiology and management., Educational material distributed., Suggested low cholesterol diet., Encouraged aerobic exercise., Discussed foot care., Reminded to get yearly retinal exam. - Hemoglobin A1c - [facility] DIABETES FOOT EXAM  6. Vitamin D deficiency Continue supplement  7. Medication management - Magnesium  8. At high risk for falls Discussed PT, declines at this time  9. Need for prophylactic vaccination against Streptococcus pneumoniae (pneumococcus) - Pneumococcal conjugate vaccine 13-valent IM   Plan:   During the course of the visit the patient was educated and counseled about appropriate screening and preventive services including:    Pneumococcal vaccine   Influenza vaccine  Td vaccine  Screening electrocardiogram  Colorectal cancer screening  Diabetes screening  Glaucoma screening  Nutrition counseling   Screening recommendations, referrals: Vaccinations: Please see documentation below and orders this visit.  Nutrition assessed and recommended  Colonoscopy declined Recommended yearly ophthalmology/optometry visit for glaucoma screening and checkup Recommended yearly dental visit for hygiene and checkup Advanced directives - requested  Conditions/risks identified: BMI: Discussed weight loss, diet, and increase physical activity.  Increase physical activity: AHA recommends 150 minutes of  physical activity a week.  Medications reviewed Diabetes is at goal, ACE/ARB therapy: No, Reason not on Ace Inhibitor/ARB therapy:  prediabetes Urinary Incontinence is not an issue: discussed non pharmacology and pharmacology options.  Fall risk: high- discussed PT, home fall assessment, medications.    Subjective:  Jack Schultz is a 80 y.o. male who presents for Medicare Annual Wellness Visit and 3 month follow up for HTN, hyperlipidemia, prediabetes, and vitamin D Def.  Date of last medicare wellness visit was is unknown. His blood pressure has been controlled at home, today their BP is BP: 120/68 mmHg He does not workout but is active with his wife having a stroke has to do a lot. He denies chest pain, shortness of breath, dizziness.  He is not on cholesterol medication and denies myalgias. His cholesterol is at goal. The cholesterol last visit was:   Lab Results  Component Value Date   CHOL 137 06/21/2014   HDL 25* 06/21/2014   LDLCALC 85 06/21/2014   TRIG 136 06/21/2014   CHOLHDL 5.5 06/21/2014   He has been working on diet and exercise for prediabetes, and denies paresthesia of the feet, polydipsia, polyuria and visual disturbances. Last A1C in the office was:  Lab Results  Component Value Date   HGBA1C 5.6 06/21/2014   Patient is on Vitamin D supplement.   Lab Results  Component Value Date   VD25OH 42 06/21/2014   He is on thyroid medication. His medication was not changed last visit.  Lab Results  Component Value Date   TSH 3.724 06/21/2014   Has lost 8 lbs in the last year, states he has cut back on breads but still eating same.  He is on hydroxyurea for essential thrombocytosis, follows with Dr. Benay Spice.  Lab Results  Component Value Date   WBC 8.6 08/23/2014   HGB 10.4* 08/23/2014   HCT 32.8* 08/23/2014  MCV 104.1* 08/23/2014   PLT 485* 08/23/2014   Names of Other Physician/Practitioners you currently use: 1. [facility] here for primary care 2. none dentist, last visit none Patient Care Team: Unk Pinto, MD as PCP - General (Internal Medicine) Hayden Pedro, MD as Consulting Physician (Ophthalmology)- 8 years ago Ladell Pier, MD as Consulting Physician (Oncology)  Medication Review: Current Outpatient Prescriptions on File Prior to Visit  Medication Sig Dispense Refill  . aspirin 81 MG tablet Take 81 mg by mouth daily.    Marland Kitchen atenolol (TENORMIN) 50 MG tablet TAKE 1 TABLET AT BEDTIME 90 tablet 4  . Cholecalciferol (VITAMIN D) 2000 UNITS tablet Take 2,000 Units by mouth daily. Takes 2 bid    . ferrous sulfate 325 (65 FE) MG tablet Take 325 mg by mouth daily with breakfast.    . furosemide (LASIX) 40 MG tablet TAKE 1 TABLET EVERY DAY 90 tablet 2  . levothyroxine (SYNTHROID, LEVOTHROID) 50 MCG tablet TAKE 1 TABLET BY MOUTH EVERY DAY 90 tablet 2   No current facility-administered medications on file prior to visit.    Current Problems (verified) Patient Active Problem List   Diagnosis Date Noted  . Encounter for long-term (current) use of other medications 12/12/2013  . Hypothyroidism 12/12/2013  . Hypertension 09/14/2013  . Hyperlipidemia 09/14/2013  . PreDiabetes 09/14/2013  . Vitamin D Deficiency 09/14/2013  . Essential thrombocytosis 07/08/2012    Screening Tests Health Maintenance  Topic Date Due  . TETANUS/TDAP  12/15/1942  . COLONOSCOPY  12/16/1975  . ZOSTAVAX  12/15/1985  . INFLUENZA VACCINE  04/24/2015  . PNEUMOCOCCAL POLYSACCHARIDE VACCINE AGE 66 AND OVER  Completed    Immunization History  Administered Date(s) Administered  . DT 12/13/2013  . DTaP 09/24/2003  . Influenza Whole 06/10/2013  . Influenza, High Dose Seasonal PF 06/21/2014  . Pneumococcal Polysaccharide-23 09/23/2004    Preventative care: Last colonoscopy: never, declines due to age  Prior vaccinations: TD or Tdap: 2015  Influenza: 2015 Pneumococcal: 2006 Prevnar13: DUE Shingles/Zostavax:  2006    Medication List       This list is accurate as of: 09/20/14  3:03 PM.  Always use your most recent med list.               aspirin 81 MG tablet  Take 81 mg by mouth daily.     atenolol 50 MG tablet  Commonly known as:  TENORMIN  TAKE 1 TABLET AT BEDTIME     ferrous sulfate 325 (65 FE) MG tablet  Take 325 mg by mouth daily with breakfast.     furosemide 40 MG tablet  Commonly known as:  LASIX  TAKE 1 TABLET EVERY DAY     hydroxyurea 500 MG capsule  Commonly known as:  HYDREA  TAKE 1 CAPSULE EVERY DAY EXCEPT FOR MON AND FRI, TAKE 2 CAPSULES OR AS DIRECTED     levothyroxine 50 MCG tablet  Commonly known as:  SYNTHROID, LEVOTHROID  TAKE 1 TABLET BY MOUTH EVERY DAY     Vitamin D 2000 UNITS tablet  Take 2,000 Units by mouth daily. Takes 2 bid        No past surgical history on file. Family History  Problem Relation Age of Onset  . Hypertension Father   . Heart disease Father     Review of Systems:  Review of Systems  Constitutional: Positive for weight loss (8 lbs over a year). Negative for fever, chills, malaise/fatigue and diaphoresis.  Respiratory: Negative.   Cardiovascular: Negative.   Gastrointestinal: Negative.   Genitourinary: Negative.   Musculoskeletal: Positive for joint pain, falls and neck pain. Negative for myalgias and back pain.  Skin: Negative.   Neurological: Negative.  Negative for weakness.  Psychiatric/Behavioral: Negative.    History reviewed: allergies, current medications, past family history, past medical history, past social history, past surgical history and problem list  Risk Factors: Tobacco History  Substance Use Topics  . Smoking status: Former Smoker    Quit date: 09/24/1975  . Smokeless tobacco: Current User  . Alcohol Use: No   He does not smoke.  Patient is a former smoker. Are there smokers in your home (other than you)?  No  Alcohol Current alcohol use: none  Caffeine Current caffeine use: coffee 1  /day  Exercise Current exercise: no regular exercise and takes care of wife  Nutrition/Diet Current diet: in general, a "healthy" diet    Cardiac risk factors: advanced age (older than 12 for men, 69 for women), dyslipidemia, family history of premature cardiovascular disease, hypertension, male gender, obesity (BMI >= 30 kg/m2) and sedentary lifestyle.  Depression Screen (Note: if answer to either of the following is "Yes", a more complete depression screening is indicated)   Q1: Over the past two weeks, have you felt down, depressed or hopeless? No  Q2: Over the past two weeks, have you felt little interest or pleasure in doing things? No  Have you lost interest or pleasure in daily life? No  Do you often feel hopeless? No  Do you cry easily over simple problems? No  Activities of Daily Living In your present state of health, do you have any difficulty performing the following activities?:  Driving? No Managing money?  No Feeding yourself? No Getting from bed to chair? No Climbing a flight of stairs? No Preparing food and eating?: No Bathing or showering? No Getting dressed: No Getting to the toilet? No Using the toilet:No Moving around from place to place: No In the past year have you fallen or had a near fall?:Yes   Are you sexually active?  No  Do you have more than one partner?  No  Vision Difficulties: No  Hearing Difficulties: Yes Do you often ask people to speak up or repeat themselves? No Do you experience ringing or noises in your ears? No Do you have difficulty understanding soft or whispered voices? No  Cognition  Do you feel that you have a problem with memory?No  Do you often misplace items? No  Do you feel safe at home?  Yes  Advanced directives Does patient have a [place]? No Does patient have a Living Will? No   Objective:   Blood pressure 120/68, pulse 72, temperature 97.7 F (36.5 C), resp. rate 16, height 5\' 10"   (1.778 m), weight 185 lb (83.915 kg). Body mass index is 26.54 kg/(m^2).  General appearance: alert, no distress, WD/WN, male Cognitive Testing  Alert? Yes  Normal Appearance?Yes  Oriented to person? Yes  Place? Yes   Time? Yes  Recall of three objects?  Yes  Can perform simple calculations? Yes  Displays appropriate judgment?Yes  Can read the correct time from a watch face?Yes  HEENT: normocephalic, sclerae anicteric, TMs pearly, nares patent, no discharge or erythema, pharynx normal Oral cavity: MMM, no lesions Neck: supple, no lymphadenopathy, no thyromegaly, no masses Heart: RRR, normal S1, S2, no murmurs Lungs: CTA bilaterally, no wheezes, rhonchi, or rales Abdomen: +bs, soft, non tender,  non distended, no masses, no hepatomegaly, no splenomegaly Musculoskeletal: nontender, no swelling, no obvious deformity Extremities: no edema, no cyanosis, no clubbing Pulses: 2+ symmetric, upper and lower extremities, normal cap refill Neurological: alert, oriented x 3, CN2-12 intact, strength normal upper extremities and lower extremities, sensation normal throughout, DTRs 2+ throughout, no cerebellar signs, gait normal Psychiatric: normal affect, behavior normal, pleasant   Medicare Attestation I have personally reviewed: The patient's medical and social history Their use of alcohol, tobacco or illicit drugs Their current medications and supplements The patient's functional ability including ADLs,fall risks, home safety risks, cognitive, and hearing and visual impairment Diet and physical activities Evidence for depression or mood disorders  The patient's weight, height, BMI, and visual acuity have been recorded in the chart.  I have made referrals, counseling, and provided education to the patient based on review of the above and I have provided the patient with a written personalized care plan for preventive services.     Vicie Mutters, PA-C   09/20/2014

## 2014-09-20 NOTE — Patient Instructions (Signed)
Preventative Care for Adults, Male       REGULAR HEALTH EXAMS:  A routine yearly physical is a good way to check in with your primary care provider about your health and preventive screening. It is also an opportunity to share updates about your health and any concerns you have, and receive a thorough all-over exam.   Most health insurance companies pay for at least some preventative services.  Check with your health plan for specific coverages.  WHAT PREVENTATIVE SERVICES DO MEN NEED?  Adult men should have their weight and blood pressure checked regularly.   Men age 35 and older should have their cholesterol levels checked regularly.  Beginning at age 50 and continuing to age 75, men should be screened for colorectal cancer.  Certain people should may need continued testing until age 85.  Other cancer screening may include exams for testicular and prostate cancer.  Updating vaccinations is part of preventative care.  Vaccinations help protect against diseases such as the flu.  Lab tests are generally done as part of preventative care to screen for anemia and blood disorders, to screen for problems with the kidneys and liver, to screen for bladder problems, to check blood sugar, and to check your cholesterol level.  Preventative services generally include counseling about diet, exercise, avoiding tobacco, drugs, excessive alcohol consumption, and sexually transmitted infections.    GENERAL RECOMMENDATIONS FOR GOOD HEALTH:  Healthy diet:  Eat a variety of foods, including fruit, vegetables, animal or vegetable protein, such as meat, fish, chicken, and eggs, or beans, lentils, tofu, and grains, such as rice.  Drink plenty of water daily.  Decrease saturated fat in the diet, avoid lots of red meat, processed foods, sweets, fast foods, and fried foods.  Exercise:  Aerobic exercise helps maintain good heart health. At least 30-40 minutes of moderate-intensity exercise is recommended.  For example, a brisk walk that increases your heart rate and breathing. This should be done on most days of the week.   Find a type of exercise or a variety of exercises that you enjoy so that it becomes a part of your daily life.  Examples are running, walking, swimming, water aerobics, and biking.  For motivation and support, explore group exercise such as aerobic class, spin class, Zumba, Yoga,or  martial arts, etc.    Set exercise goals for yourself, such as a certain weight goal, walk or run in a race such as a 5k walk/run.  Speak to your primary care provider about exercise goals.  Disease prevention:  If you smoke or chew tobacco, find out from your caregiver how to quit. It can literally save your life, no matter how long you have been a tobacco user. If you do not use tobacco, never begin.   Maintain a healthy diet and normal weight. Increased weight leads to problems with blood pressure and diabetes.   The Body Mass Index or BMI is a way of measuring how much of your body is fat. Having a BMI above 27 increases the risk of heart disease, diabetes, hypertension, stroke and other problems related to obesity. Your caregiver can help determine your BMI and based on it develop an exercise and dietary program to help you achieve or maintain this important measurement at a healthful level.  High blood pressure causes heart and blood vessel problems.  Persistent high blood pressure should be treated with medicine if weight loss and exercise do not work.   Fat and cholesterol leaves deposits in your arteries   that can block them. This causes heart disease and vessel disease elsewhere in your body.  If your cholesterol is found to be high, or if you have heart disease or certain other medical conditions, then you may need to have your cholesterol monitored frequently and be treated with medication.   Ask if you should have a stress test if your history suggests this. A stress test is a test done on  a treadmill that looks for heart disease. This test can find disease prior to there being a problem.  Avoid drinking alcohol in excess (more than two drinks per day).  Avoid use of street drugs. Do not share needles with anyone. Ask for professional help if you need assistance or instructions on stopping the use of alcohol, cigarettes, and/or drugs.  Brush your teeth twice a day with fluoride toothpaste, and floss once a day. Good oral hygiene prevents tooth decay and gum disease. The problems can be painful, unattractive, and can cause other health problems. Visit your dentist for a routine oral and dental check up and preventive care every 6-12 months.   Look at your skin regularly.  Use a mirror to look at your back. Notify your caregivers of changes in moles, especially if there are changes in shapes, colors, a size larger than a pencil eraser, an irregular border, or development of new moles.  Safety:  Use seatbelts 100% of the time, whether driving or as a passenger.  Use safety devices such as hearing protection if you work in environments with loud noise or significant background noise.  Use safety glasses when doing any work that could send debris in to the eyes.  Use a helmet if you ride a bike or motorcycle.  Use appropriate safety gear for contact sports.  Talk to your caregiver about gun safety.  Use sunscreen with a SPF (or skin protection factor) of 15 or greater.  Lighter skinned people are at a greater risk of skin cancer. Don't forget to also wear sunglasses in order to protect your eyes from too much damaging sunlight. Damaging sunlight can accelerate cataract formation.   Practice safe sex. Use condoms. Condoms are used for birth control and to help reduce the spread of sexually transmitted infections (or STIs).  Some of the STIs are gonorrhea (the clap), chlamydia, syphilis, trichomonas, herpes, HPV (human papilloma virus) and HIV (human immunodeficiency virus) which causes AIDS.  The herpes, HIV and HPV are viral illnesses that have no cure. These can result in disability, cancer and death.   Keep carbon monoxide and smoke detectors in your home functioning at all times. Change the batteries every 6 months or use a model that plugs into the wall.   Vaccinations:  Stay up to date with your tetanus shots and other required immunizations. You should have a booster for tetanus every 10 years. Be sure to get your flu shot every year, since 5%-20% of the [place] population comes down with the flu. The flu vaccine changes each year, so being vaccinated once is not enough. Get your shot in the fall, before the flu season peaks.   Other vaccines to consider:  Pneumococcal vaccine to protect against certain types of pneumonia.  This is normally recommended for adults age 65 or older.  However, adults younger than 78 years old with certain underlying conditions such as diabetes, heart or lung disease should also receive the vaccine.  Shingles vaccine to protect against Varicella Zoster if you are older than age 60, or younger   than 78 years old with certain underlying illness.  Hepatitis A vaccine to protect against a form of infection of the liver by a virus acquired from food.  Hepatitis B vaccine to protect against a form of infection of the liver by a virus acquired from blood or body fluids, particularly if you work in health care.  If you plan to travel internationally, check with your local health department for specific vaccination recommendations.  Cancer Screening:  Most routine colon cancer screening begins at the age of 50. On a yearly basis, doctors may provide special easy to use take-home tests to check for hidden blood in the stool. Sigmoidoscopy or colonoscopy can detect the earliest forms of colon cancer and is life saving. These tests use a small camera at the end of a tube to directly examine the colon. Speak to your caregiver about this at age 50, when routine  screening begins (and is repeated every 5 years unless early forms of pre-cancerous polyps or small growths are found).   At the age of 50 men usually start screening for prostate cancer every year. Screening may begin at a younger age for those with higher risk. Those at higher risk include African-Americans or having a family history of prostate cancer. There are two types of tests for prostate cancer:   Prostate-specific antigen (PSA) testing. Recent studies raise questions about prostate cancer using PSA and you should discuss this with your caregiver.   Digital rectal exam (in which your doctor's lubricated and gloved finger feels for enlargement of the prostate through the anus).   Screening for testicular cancer.  Do a monthly exam of your testicles. Gently roll each testicle between your thumb and fingers, feeling for any abnormal lumps. The best time to do this is after a hot shower or bath when the tissues are looser. Notify your caregivers of any lumps, tenderness or changes in size or shape immediately.     

## 2014-09-21 LAB — CBC WITH DIFFERENTIAL/PLATELET
Basophils Absolute: 0.1 10*3/uL (ref 0.0–0.1)
Basophils Relative: 1 % (ref 0–1)
Eosinophils Absolute: 0.1 10*3/uL (ref 0.0–0.7)
Eosinophils Relative: 1 % (ref 0–5)
HEMATOCRIT: 31.7 % — AB (ref 39.0–52.0)
Hemoglobin: 10.3 g/dL — ABNORMAL LOW (ref 13.0–17.0)
Lymphocytes Relative: 10 % — ABNORMAL LOW (ref 12–46)
Lymphs Abs: 0.9 10*3/uL (ref 0.7–4.0)
MCH: 32.3 pg (ref 26.0–34.0)
MCHC: 32.5 g/dL (ref 30.0–36.0)
MCV: 99.4 fL (ref 78.0–100.0)
MONO ABS: 0.4 10*3/uL (ref 0.1–1.0)
MONOS PCT: 5 % (ref 3–12)
MPV: 9.2 fL (ref 8.6–12.4)
NEUTROS PCT: 83 % — AB (ref 43–77)
Neutro Abs: 7.1 10*3/uL (ref 1.7–7.7)
Platelets: 439 10*3/uL — ABNORMAL HIGH (ref 150–400)
RBC: 3.19 MIL/uL — ABNORMAL LOW (ref 4.22–5.81)
RDW: 25.3 % — ABNORMAL HIGH (ref 11.5–15.5)
WBC: 8.5 10*3/uL (ref 4.0–10.5)

## 2014-09-21 LAB — HEMOGLOBIN A1C
HEMOGLOBIN A1C: 5.7 % — AB (ref <5.7)
Mean Plasma Glucose: 117 mg/dL — ABNORMAL HIGH (ref <117)

## 2014-09-21 LAB — TSH: TSH: 2.606 u[IU]/mL (ref 0.350–4.500)

## 2014-10-20 ENCOUNTER — Telehealth: Payer: Self-pay | Admitting: Oncology

## 2014-10-20 ENCOUNTER — Ambulatory Visit (HOSPITAL_BASED_OUTPATIENT_CLINIC_OR_DEPARTMENT_OTHER): Payer: Medicare Other | Admitting: Oncology

## 2014-10-20 ENCOUNTER — Other Ambulatory Visit (HOSPITAL_BASED_OUTPATIENT_CLINIC_OR_DEPARTMENT_OTHER): Payer: Medicare Other

## 2014-10-20 VITALS — BP 134/66 | HR 65 | Temp 98.3°F | Resp 18 | Ht 70.0 in | Wt 186.5 lb

## 2014-10-20 DIAGNOSIS — D473 Essential (hemorrhagic) thrombocythemia: Secondary | ICD-10-CM

## 2014-10-20 LAB — COMPREHENSIVE METABOLIC PANEL (CC13)
ALT: 17 U/L (ref 0–55)
ANION GAP: 10 meq/L (ref 3–11)
AST: 21 U/L (ref 5–34)
Albumin: 4 g/dL (ref 3.5–5.0)
Alkaline Phosphatase: 60 U/L (ref 40–150)
BUN: 21.6 mg/dL (ref 7.0–26.0)
CO2: 25 mEq/L (ref 22–29)
Calcium: 8.6 mg/dL (ref 8.4–10.4)
Chloride: 104 mEq/L (ref 98–109)
Creatinine: 1.2 mg/dL (ref 0.7–1.3)
EGFR: 55 mL/min/{1.73_m2} — AB (ref >90)
Glucose: 103 mg/dl (ref 70–140)
POTASSIUM: 4.3 meq/L (ref 3.5–5.1)
SODIUM: 139 meq/L (ref 136–145)
Total Bilirubin: 0.75 mg/dL (ref 0.20–1.20)
Total Protein: 6.4 g/dL (ref 6.4–8.3)

## 2014-10-20 LAB — CBC WITH DIFFERENTIAL/PLATELET
BASO%: 1.2 % (ref 0.0–2.0)
Basophils Absolute: 0.1 10*3/uL (ref 0.0–0.1)
EOS ABS: 0.1 10*3/uL (ref 0.0–0.5)
EOS%: 0.7 % (ref 0.0–7.0)
HCT: 32.1 % — ABNORMAL LOW (ref 38.4–49.9)
HEMOGLOBIN: 10 g/dL — AB (ref 13.0–17.1)
LYMPH%: 7.6 % — ABNORMAL LOW (ref 14.0–49.0)
MCH: 32.4 pg (ref 27.2–33.4)
MCHC: 31.2 g/dL — ABNORMAL LOW (ref 32.0–36.0)
MCV: 103.9 fL — ABNORMAL HIGH (ref 79.3–98.0)
MONO#: 0.8 10*3/uL (ref 0.1–0.9)
MONO%: 9.6 % (ref 0.0–14.0)
NEUT%: 80.9 % — ABNORMAL HIGH (ref 39.0–75.0)
NEUTROS ABS: 7 10*3/uL — AB (ref 1.5–6.5)
NRBC: 2 % — AB (ref 0–0)
Platelets: 381 10*3/uL (ref 140–400)
RBC: 3.09 10*6/uL — ABNORMAL LOW (ref 4.20–5.82)
RDW: 24.1 % — ABNORMAL HIGH (ref 11.0–14.6)
WBC: 8.7 10*3/uL (ref 4.0–10.3)
lymph#: 0.7 10*3/uL — ABNORMAL LOW (ref 0.9–3.3)

## 2014-10-20 LAB — TECHNOLOGIST REVIEW

## 2014-10-20 NOTE — Progress Notes (Signed)
  [facility] OFFICE PROGRESS NOTE   Diagnosis: Essential thrombocytosis  INTERVAL HISTORY:   Jack Schultz returns as scheduled. He feels well. No symptom of venous or arterial thrombosis. He last had a nosebleed in November 2015. He is taking hydroxyurea once daily. He reports several falls after moving quickly.  Objective:  Vital signs in last 24 hours:  Blood pressure 134/66, pulse 65, temperature 98.3 F (36.8 C), temperature source Oral, resp. rate 18, height 5\' 10"  (1.778 m), weight 186 lb 8 oz (84.596 kg).    HEENT: No thrush or ulcers Resp: Lungs clear bilaterally Cardio: Regular rate and rhythm GI: No hepatosplenomegaly Vascular: No leg edema, the right lower leg is larger than the left side   Lab Results:  Lab Results  Component Value Date   WBC 8.7 10/20/2014   HGB 10.0* 10/20/2014   HCT 32.1* 10/20/2014   MCV 103.9* 10/20/2014   PLT 381 10/20/2014   NEUTROABS 7.0* 10/20/2014     Medications: I have reviewed the patient's current medications.  Assessment/Plan: 1. Essential thrombocytosis. He continues hydroxyurea therapy. The platelet count is adequately controlled. 2. History of a macrocytic anemia. Most likely secondary to hydroxyurea therapy and the underlying myeloproliferative disorder. The hemoglobin is stable.  3. history of epistaxis-he reports this occurs once every 1-2 years. Related to the myeloproliferative disorder and aspirin therapy?    Disposition:  He appears stable. He will continue hydroxyurea at current dose. Jack Schultz will return for a lab visit in 3 months and an office visit in 6 months. He is likely requiring less hydroxyurea at an advanced stage of the myeloproliferative disorder.  Betsy Coder, MD  10/20/2014  11:20 AM

## 2014-10-20 NOTE — Telephone Encounter (Signed)
, °

## 2014-12-14 ENCOUNTER — Encounter: Payer: Self-pay | Admitting: Internal Medicine

## 2014-12-16 ENCOUNTER — Other Ambulatory Visit: Payer: Self-pay | Admitting: Oncology

## 2014-12-18 ENCOUNTER — Emergency Department (HOSPITAL_COMMUNITY)
Admission: EM | Admit: 2014-12-18 | Discharge: 2014-12-18 | Disposition: A | Discharge status: Home / Self Care | Payer: Medicare Other | Attending: Emergency Medicine | Admitting: Emergency Medicine

## 2014-12-18 ENCOUNTER — Encounter (HOSPITAL_COMMUNITY): Payer: Self-pay | Admitting: *Deleted

## 2014-12-18 DIAGNOSIS — Z79899 Other long term (current) drug therapy: Secondary | ICD-10-CM | POA: Diagnosis not present

## 2014-12-18 DIAGNOSIS — Z87891 Personal history of nicotine dependence: Secondary | ICD-10-CM | POA: Diagnosis not present

## 2014-12-18 DIAGNOSIS — Z8719 Personal history of other diseases of the digestive system: Secondary | ICD-10-CM | POA: Diagnosis not present

## 2014-12-18 DIAGNOSIS — I10 Essential (primary) hypertension: Secondary | ICD-10-CM | POA: Insufficient documentation

## 2014-12-18 DIAGNOSIS — D473 Essential (hemorrhagic) thrombocythemia: Secondary | ICD-10-CM | POA: Diagnosis not present

## 2014-12-18 DIAGNOSIS — E079 Disorder of thyroid, unspecified: Secondary | ICD-10-CM | POA: Insufficient documentation

## 2014-12-18 DIAGNOSIS — R040 Epistaxis: Secondary | ICD-10-CM | POA: Insufficient documentation

## 2014-12-18 DIAGNOSIS — Z7982 Long term (current) use of aspirin: Secondary | ICD-10-CM | POA: Insufficient documentation

## 2014-12-18 DIAGNOSIS — E559 Vitamin D deficiency, unspecified: Secondary | ICD-10-CM | POA: Diagnosis not present

## 2014-12-18 DIAGNOSIS — E785 Hyperlipidemia, unspecified: Secondary | ICD-10-CM | POA: Insufficient documentation

## 2014-12-18 DIAGNOSIS — R0981 Nasal congestion: Secondary | ICD-10-CM | POA: Insufficient documentation

## 2014-12-18 LAB — CBC WITH DIFFERENTIAL/PLATELET
Basophils Absolute: 0.1 10*3/uL (ref 0.0–0.1)
Basophils Relative: 1 % (ref 0–1)
EOS PCT: 0 % (ref 0–5)
Eosinophils Absolute: 0 10*3/uL (ref 0.0–0.7)
HEMATOCRIT: 32.1 % — AB (ref 39.0–52.0)
HEMOGLOBIN: 10.1 g/dL — AB (ref 13.0–17.0)
Lymphocytes Relative: 9 % — ABNORMAL LOW (ref 12–46)
Lymphs Abs: 0.6 10*3/uL — ABNORMAL LOW (ref 0.7–4.0)
MCH: 33.7 pg (ref 26.0–34.0)
MCHC: 31.5 g/dL (ref 30.0–36.0)
MCV: 107 fL — ABNORMAL HIGH (ref 78.0–100.0)
MONOS PCT: 7 % (ref 3–12)
Monocytes Absolute: 0.5 10*3/uL (ref 0.1–1.0)
NEUTROS ABS: 5.8 10*3/uL (ref 1.7–7.7)
NRBC: 2 /100{WBCs} — AB
Neutrophils Relative %: 83 % — ABNORMAL HIGH (ref 43–77)
Platelets: 463 10*3/uL — ABNORMAL HIGH (ref 150–400)
RBC: 3 MIL/uL — ABNORMAL LOW (ref 4.22–5.81)
RDW: 24.5 % — AB (ref 11.5–15.5)
WBC: 7 10*3/uL (ref 4.0–10.5)

## 2014-12-18 LAB — BASIC METABOLIC PANEL
Anion gap: 7 (ref 5–15)
BUN: 27 mg/dL — AB (ref 6–23)
CALCIUM: 8.7 mg/dL (ref 8.4–10.5)
CHLORIDE: 108 mmol/L (ref 96–112)
CO2: 25 mmol/L (ref 19–32)
CREATININE: 1.15 mg/dL (ref 0.50–1.35)
GFR calc Af Amer: 63 mL/min — ABNORMAL LOW (ref >90)
GFR calc non Af Amer: 54 mL/min — ABNORMAL LOW (ref >90)
GLUCOSE: 101 mg/dL — AB (ref 70–99)
POTASSIUM: 4.4 mmol/L (ref 3.5–5.1)
SODIUM: 140 mmol/L (ref 135–145)

## 2014-12-18 MED ORDER — OXYMETAZOLINE HCL 0.05 % NA SOLN
2.0000 | Freq: Once | NASAL | Status: AC
  Administered 2014-12-18: 2 via NASAL
  Filled 2014-12-18: qty 15

## 2014-12-18 MED ORDER — AMOXICILLIN-POT CLAVULANATE 875-125 MG PO TABS: 1.0000 | ORAL_TABLET | Freq: Two times a day (BID) | ORAL | Status: DC

## 2014-12-18 NOTE — Discharge Instructions (Signed)

## 2014-12-18 NOTE — ED Notes (Signed)
Pt c/o epistaxis reports hx of same, reports taking aspirin.

## 2014-12-18 NOTE — ED Provider Notes (Signed)
CSN: [id number]     Arrival date & time 12/18/14  1304 History   First MD Initiated Contact with Patient 12/18/14 1451     Chief Complaint  Patient presents with  . Epistaxis     (Consider location/radiation/quality/duration/timing/severity/associated sxs/prior Treatment) Patient is a 81 y.o. male presenting with nosebleeds.  Epistaxis Location:  R nare Severity:  Moderate Duration:  8 hours Timing:  Constant Progression:  Unchanged Chronicity:  New Context: aspirin use and bleeding disorder (thrombocytosis, on hydroxyurea)   Context: not anticoagulants   Relieved by:  Nothing Worsened by:  Nothing tried Ineffective treatments:  Applying pressure Associated symptoms: congestion   Associated symptoms: no blood in oropharynx and no fever     Past Medical History  Diagnosis Date  . Hypertension   . Hyperlipidemia   . Pre-diabetes   . Thyroid disease   . Thrombocytosis   . GERD (gastroesophageal reflux disease)   . Vitamin D deficiency    History reviewed. No pertinent past surgical history. Family History  Problem Relation Age of Onset  . Hypertension Father   . Heart disease Father    History  Substance Use Topics  . Smoking status: Former Smoker    Quit date: 09/24/1975  . Smokeless tobacco: Current User  . Alcohol Use: No    Review of Systems  Constitutional: Negative for fever.  HENT: Positive for congestion and nosebleeds.   All other systems reviewed and are negative.     Allergies  Ace inhibitors  Home Medications   Prior to Admission medications   Medication Sig Start Date End Date Taking? Authorizing Provider  aspirin 81 MG tablet Take 81 mg by mouth daily.   Yes Historical Provider, MD  atenolol (TENORMIN) 50 MG tablet TAKE 1 TABLET AT BEDTIME 06/03/14  Yes Unk Pinto, MD  Cholecalciferol (VITAMIN D) 2000 UNITS tablet Take 2,000 Units by mouth daily. Takes 2 bid   Yes Historical Provider, MD  ferrous sulfate 325 (65 FE) MG tablet Take  325 mg by mouth daily with breakfast.   Yes Historical Provider, MD  furosemide (LASIX) 40 MG tablet TAKE 1 TABLET EVERY DAY 03/28/14  Yes Unk Pinto, MD  hydroxyurea (HYDREA) 500 MG capsule Take 1 capsule (500 mg total) by mouth daily. 12/16/14  Yes Ladell Pier, MD  levothyroxine (SYNTHROID, LEVOTHROID) 50 MCG tablet TAKE 1 TABLET BY MOUTH EVERY DAY 05/11/14  Yes Unk Pinto, MD  amoxicillin-clavulanate (AUGMENTIN) 875-125 MG per tablet Take 1 tablet by mouth every 12 (twelve) hours. 12/18/14   Debby Freiberg, MD   BP 115/53 mmHg  Pulse 76  Resp 18  SpO2 98% Physical Exam  Constitutional: He is oriented to person, place, and time. He appears well-developed and well-nourished.  HENT:  Head: Normocephalic and atraumatic.  Nose: Epistaxis (R nare) is observed.  Eyes: Conjunctivae and EOM are normal.  Neck: Normal range of motion. Neck supple.  Cardiovascular: Normal rate, regular rhythm and normal heart sounds.   Pulmonary/Chest: Effort normal and breath sounds normal. No respiratory distress.  Abdominal: He exhibits no distension. There is no tenderness. There is no rebound and no guarding.  Musculoskeletal: Normal range of motion.  Neurological: He is alert and oriented to person, place, and time.  Skin: Skin is warm and dry.  Vitals reviewed.   ED Course  Procedures (including critical care time) Labs Review Labs Reviewed  CBC WITH DIFFERENTIAL/PLATELET - Abnormal; Notable for the following:    RBC 3.00 (*)    Hemoglobin 10.1 (*)  HCT 32.1 (*)    MCV 107.0 (*)    RDW 24.5 (*)    Platelets 463 (*)    Neutrophils Relative % 83 (*)    Lymphocytes Relative 9 (*)    nRBC 2 (*)    Lymphs Abs 0.6 (*)    All other components within normal limits  BASIC METABOLIC PANEL - Abnormal; Notable for the following:    Glucose, Bld 101 (*)    BUN 27 (*)    GFR calc non Af Amer 54 (*)    GFR calc Af Amer 63 (*)    All other components within normal limits    Imaging  Review No results found.   EKG Interpretation None      MDM   Final diagnoses:  Epistaxis, recurrent    81 y.o. male with pertinent PMH of prior recurrent epistaxis, thrombocytosis on hydroxyurea, HTN, HLD presents with recurrent R epistaxis beginning 10 hours ago.  He has tried pressure unsuccessfully.  Physical exam as above with mild active epistaxis.  Attempted pressure and afrin, no success.  Nasal packing placed.  Pt to fu here or with ENT for removal in 3 days, placed on augmentin for PPX.  DC home in stable condition.   I have reviewed all laboratory and imaging studies if ordered as above  1. Epistaxis, recurrent         Debby Freiberg, MD 12/18/14 2102

## 2014-12-20 ENCOUNTER — Ambulatory Visit: Payer: Medicare Other | Admitting: Internal Medicine

## 2014-12-20 ENCOUNTER — Encounter: Payer: Self-pay | Admitting: Internal Medicine

## 2014-12-20 VITALS — BP 110/56 | HR 64 | Temp 97.5°F | Resp 16 | Ht 70.0 in | Wt 183.6 lb

## 2014-12-20 DIAGNOSIS — R040 Epistaxis: Secondary | ICD-10-CM

## 2014-12-20 NOTE — Progress Notes (Signed)
   Subjective:    Patient ID: Jack Schultz, male    DOB: Sep 26, 1923, 81 y.o.   MRN: [id number]  HPI Patient had right nasal packing at the ER 3 days ago on 3/27 and presents today for removal.  ER notes from 3/27 reviewed  Review of Systems  10 point systems review negative except as above.    Objective:   Physical Exam  BP 110/56 mmHg  Pulse 64  Temp(Src) 97.5 F (36.4 C)  Resp 16  Ht 5\' 10"  (1.778 m)  Wt 183 lb 9.6 oz (83.28 kg)  BMI 26.34 kg/m2  Right nasal packing removed w/o difficulty. No sign of bleeding .    Assessment & Plan:   1. Epistaxis - resolved

## 2015-01-10 ENCOUNTER — Ambulatory Visit (INDEPENDENT_AMBULATORY_CARE_PROVIDER_SITE_OTHER): Payer: Medicare Other | Admitting: Internal Medicine

## 2015-01-10 ENCOUNTER — Encounter: Payer: Self-pay | Admitting: Internal Medicine

## 2015-01-10 VITALS — BP 116/58 | HR 68 | Temp 98.4°F | Resp 16 | Ht 69.75 in | Wt 185.0 lb

## 2015-01-10 DIAGNOSIS — I10 Essential (primary) hypertension: Secondary | ICD-10-CM

## 2015-01-10 DIAGNOSIS — Z125 Encounter for screening for malignant neoplasm of prostate: Secondary | ICD-10-CM

## 2015-01-10 DIAGNOSIS — Z79899 Other long term (current) drug therapy: Secondary | ICD-10-CM

## 2015-01-10 DIAGNOSIS — R7303 Prediabetes: Secondary | ICD-10-CM

## 2015-01-10 DIAGNOSIS — R6889 Other general symptoms and signs: Secondary | ICD-10-CM

## 2015-01-10 DIAGNOSIS — Z0000 Encounter for general adult medical examination without abnormal findings: Secondary | ICD-10-CM

## 2015-01-10 DIAGNOSIS — Z1212 Encounter for screening for malignant neoplasm of rectum: Secondary | ICD-10-CM

## 2015-01-10 DIAGNOSIS — R7309 Other abnormal glucose: Secondary | ICD-10-CM

## 2015-01-10 DIAGNOSIS — Z1331 Encounter for screening for depression: Secondary | ICD-10-CM

## 2015-01-10 DIAGNOSIS — E559 Vitamin D deficiency, unspecified: Secondary | ICD-10-CM

## 2015-01-10 DIAGNOSIS — Z9181 History of falling: Secondary | ICD-10-CM

## 2015-01-10 DIAGNOSIS — E039 Hypothyroidism, unspecified: Secondary | ICD-10-CM

## 2015-01-10 DIAGNOSIS — Z0001 Encounter for general adult medical examination with abnormal findings: Secondary | ICD-10-CM

## 2015-01-10 DIAGNOSIS — D473 Essential (hemorrhagic) thrombocythemia: Secondary | ICD-10-CM

## 2015-01-10 DIAGNOSIS — E782 Mixed hyperlipidemia: Secondary | ICD-10-CM

## 2015-01-10 NOTE — Patient Instructions (Signed)
Recommend the book "The END of DIETING" by Dr Excell Seltzer   & the book "The END of DIABETES " by Dr Excell Seltzer  At [email or web address] - get book & Audio CD's      Being diabetic has a  300% increased risk for heart attack, stroke, cancer, and alzheimer- type vascular dementia. It is very important that you work harder with diet by avoiding all foods that are white. Avoid white rice (brown & wild rice is OK), white potatoes (sweetpotatoes in moderation is OK), White bread or wheat bread or anything made out of white flour like bagels, donuts, rolls, buns, biscuits, cakes, pastries, cookies, pizza crust, and pasta (made from white flour & egg whites) - vegetarian pasta or spinach or wheat pasta is OK. Multigrain breads like Arnold's or [employer], or multigrain sandwich thins or flatbreads.  Diet, exercise and weight loss can reverse and cure diabetes in the early stages.  Diet, exercise and weight loss is very important in the control and prevention of complications of diabetes which affects every system in your body, ie. Brain - dementia/stroke, eyes - glaucoma/blindness, heart - heart attack/heart failure, kidneys - dialysis, stomach - gastric paralysis, intestines - malabsorption, nerves - severe painful neuritis, circulation - gangrene & loss of a leg(s), and finally cancer and Alzheimers.    I recommend avoid fried & greasy foods,  sweets/candy, white rice (brown or wild rice or Quinoa is OK), white potatoes (sweet potatoes are OK) - anything made from white flour - bagels, doughnuts, rolls, buns, biscuits,white and wheat breads, pizza crust and traditional pasta made of white flour & egg white(vegetarian pasta or spinach or wheat pasta is OK).  Multi-grain bread is OK - like multi-grain flat bread or sandwich thins. Avoid alcohol in excess. Exercise is also important.    Eat all the vegetables you want - avoid meat, especially red meat and dairy - especially cheese.  Cheese is the most  concentrated form of trans-fats which is the worst thing to clog up our arteries. Veggie cheese is OK which can be found in the fresh produce section at [employer] or [employer] or [employer]  Preventive Care for Adults A healthy lifestyle and preventive care can promote health and wellness. Preventive health guidelines for men include the following key practices:  A routine yearly physical is a good way to check with your health care provider about your health and preventative screening. It is a chance to share any concerns and updates on your health and to receive a thorough exam.  Visit your dentist for a routine exam and preventative care every 6 months. Brush your teeth twice a day and floss once a day. Good oral hygiene prevents tooth decay and gum disease.  The frequency of eye exams is based on your age, health, family medical history, use of contact lenses, and other factors. Follow your health care provider's recommendations for frequency of eye exams.  Eat a healthy diet. Foods such as vegetables, fruits, whole grains, low-fat dairy products, and lean protein foods contain the nutrients you need without too many calories. Decrease your intake of foods high in solid fats, added sugars, and salt. Eat the right amount of calories for you.Get information about a proper diet from your health care provider, if necessary.  Regular physical exercise is one of the most important things you can do for your health. Most adults should get at least 150 minutes of moderate-intensity exercise (any activity that increases your heart rate  and causes you to sweat) each week. In addition, most adults need muscle-strengthening exercises on 2 or more days a week.  Maintain a healthy weight. The body mass index (BMI) is a screening tool to identify possible weight problems. It provides an estimate of body fat based on height and weight. Your health care provider can find your BMI and can help you achieve or  maintain a healthy weight.For adults 20 years and older:  A BMI below 18.5 is considered underweight.  A BMI of 18.5 to 24.9 is normal.  A BMI of 25 to 29.9 is considered overweight.  A BMI of 30 and above is considered obese.  Maintain normal blood lipids and cholesterol levels by exercising and minimizing your intake of saturated fat. Eat a balanced diet with plenty of fruit and vegetables. Blood tests for lipids and cholesterol should begin at age 74 and be repeated every 5 years. If your lipid or cholesterol levels are high, you are over 50, or you are at high risk for heart disease, you may need your cholesterol levels checked more frequently.Ongoing high lipid and cholesterol levels should be treated with medicines if diet and exercise are not working.  If you smoke, find out from your health care provider how to quit. If you do not use tobacco, do not start.  Lung cancer screening is recommended for adults aged 66-80 years who are at high risk for developing lung cancer because of a history of smoking. A yearly low-dose CT scan of the lungs is recommended for people who have at least a 30-pack-year history of smoking and are a current smoker or have quit within the past 15 years. A pack year of smoking is smoking an average of 1 pack of cigarettes a day for 1 year (for example: 1 pack a day for 30 years or 2 packs a day for 15 years). Yearly screening should continue until the smoker has stopped smoking for at least 15 years. Yearly screening should be stopped for people who develop a health problem that would prevent them from having lung cancer treatment.  If you choose to drink alcohol, do not have more than 2 drinks per day. One drink is considered to be 12 ounces (355 mL) of beer, 5 ounces (148 mL) of wine, or 1.5 ounces (44 mL) of liquor.  Avoid use of street drugs. Do not share needles with anyone. Ask for help if you need support or instructions about stopping the use of  drugs.  High blood pressure causes heart disease and increases the risk of stroke. Your blood pressure should be checked at least every 1-2 years. Ongoing high blood pressure should be treated with medicines, if weight loss and exercise are not effective.  If you are 62-43 years old, ask your health care provider if you should take aspirin to prevent heart disease.  Diabetes screening involves taking a blood sample to check your fasting blood sugar level. This should be done once every 3 years, after age 52, if you are within normal weight and without risk factors for diabetes. Testing should be considered at a younger age or be carried out more frequently if you are overweight and have at least 1 risk factor for diabetes.  Colorectal cancer can be detected and often prevented. Most routine colorectal cancer screening begins at the age of 52 and continues through age 104. However, your health care provider may recommend screening at an earlier age if you have risk factors for colon cancer.  On a yearly basis, your health care provider may provide home test kits to check for hidden blood in the stool. Use of a small camera at the end of a tube to directly examine the colon (sigmoidoscopy or colonoscopy) can detect the earliest forms of colorectal cancer. Talk to your health care provider about this at age 9, when routine screening begins. Direct exam of the colon should be repeated every 5-10 years through age 9, unless early forms of precancerous polyps or small growths are found.  People who are at an increased risk for hepatitis B should be screened for this virus. You are considered at high risk for hepatitis B if:  You were born in a country where hepatitis B occurs often. Talk with your health care provider about which countries are considered high risk.  Your parents were born in a high-risk country and you have not received a shot to protect against hepatitis B (hepatitis B vaccine).  You have  HIV or AIDS.  You use needles to inject street drugs.  You live with, or have sex with, someone who has hepatitis B.  You are a man who has sex with other men (MSM).  You get hemodialysis treatment.  You take certain medicines for conditions such as cancer, organ transplantation, and autoimmune conditions.  Hepatitis C blood testing is recommended for all people born from [birth year] through 1965 and any individual with known risks for hepatitis C.  Practice safe sex. Use condoms and avoid high-risk sexual practices to reduce the spread of sexually transmitted infections (STIs). STIs include gonorrhea, chlamydia, syphilis, trichomonas, herpes, HPV, and human immunodeficiency virus (HIV). Herpes, HIV, and HPV are viral illnesses that have no cure. They can result in disability, cancer, and death.  If you are at risk of being infected with HIV, it is recommended that you take a prescription medicine daily to prevent HIV infection. This is called preexposure prophylaxis (PrEP). You are considered at risk if:  You are a man who has sex with other men (MSM) and have other risk factors.  You are a heterosexual man, are sexually active, and are at increased risk for HIV infection.  You take drugs by injection.  You are sexually active with a partner who has HIV.  Talk with your health care provider about whether you are at high risk of being infected with HIV. If you choose to begin PrEP, you should first be tested for HIV. You should then be tested every 3 months for as long as you are taking PrEP.  A one-time screening for abdominal aortic aneurysm (AAA) and surgical repair of large AAAs by ultrasound are recommended for men ages 39 to 63 years who are current or former smokers.  Healthy men should no longer receive prostate-specific antigen (PSA) blood tests as part of routine cancer screening. Talk with your health care provider about prostate cancer screening.  Testicular cancer screening is  not recommended for adult males who have no symptoms. Screening includes self-exam, a health care provider exam, and other screening tests. Consult with your health care provider about any symptoms you have or any concerns you have about testicular cancer.  Use sunscreen. Apply sunscreen liberally and repeatedly throughout the day. You should seek shade when your shadow is shorter than you. Protect yourself by wearing long sleeves, pants, a wide-brimmed hat, and sunglasses year round, whenever you are outdoors.  Once a month, do a whole-body skin exam, using a mirror to look at the skin on  your back. Tell your health care provider about new moles, moles that have irregular borders, moles that are larger than a pencil eraser, or moles that have changed in shape or color.  Stay current with required vaccines (immunizations).  Influenza vaccine. All adults should be immunized every year.  Tetanus, diphtheria, and acellular pertussis (Td, Tdap) vaccine. An adult who has not previously received Tdap or who does not know his vaccine status should receive 1 dose of Tdap. This initial dose should be followed by tetanus and diphtheria toxoids (Td) booster doses every 10 years. Adults with an unknown or incomplete history of completing a 3-dose immunization series with Td-containing vaccines should begin or complete a primary immunization series including a Tdap dose. Adults should receive a Td booster every 10 years.  Varicella vaccine. An adult without evidence of immunity to varicella should receive 2 doses or a second dose if he has previously received 1 dose.  Human papillomavirus (HPV) vaccine. Males aged 8-21 years who have not received the vaccine previously should receive the 3-dose series. Males aged 22-26 years may be immunized. Immunization is recommended through the age of 49 years for any male who has sex with males and did not get any or all doses earlier. Immunization is recommended for any  person with an immunocompromised condition through the age of 14 years if he did not get any or all doses earlier. During the 3-dose series, the second dose should be obtained 4-8 weeks after the first dose. The third dose should be obtained 24 weeks after the first dose and 16 weeks after the second dose.  Zoster vaccine. One dose is recommended for adults aged 62 years or older unless certain conditions are present.  Measles, mumps, and rubella (MMR) vaccine. Adults born before 45 generally are considered immune to measles and mumps. Adults born in [birth year] or later should have 1 or more doses of MMR vaccine unless there is a contraindication to the vaccine or there is laboratory evidence of immunity to each of the three diseases. A routine second dose of MMR vaccine should be obtained at least 28 days after the first dose for students attending postsecondary schools, health care workers, or international travelers. People who received inactivated measles vaccine or an unknown type of measles vaccine during 1963-1967 should receive 2 doses of MMR vaccine. People who received inactivated mumps vaccine or an unknown type of mumps vaccine before 1979 and are at high risk for mumps infection should consider immunization with 2 doses of MMR vaccine. Unvaccinated health care workers born before [birth year] who lack laboratory evidence of measles, mumps, or rubella immunity or laboratory confirmation of disease should consider measles and mumps immunization with 2 doses of MMR vaccine or rubella immunization with 1 dose of MMR vaccine.  Pneumococcal 13-valent conjugate (PCV13) vaccine. When indicated, a person who is uncertain of his immunization history and has no record of immunization should receive the PCV13 vaccine. An adult aged 2 years or older who has certain medical conditions and has not been previously immunized should receive 1 dose of PCV13 vaccine. This PCV13 should be followed with a dose of pneumococcal  polysaccharide (PPSV23) vaccine. The PPSV23 vaccine dose should be obtained at least 8 weeks after the dose of PCV13 vaccine. An adult aged 57 years or older who has certain medical conditions and previously received 1 or more doses of PPSV23 vaccine should receive 1 dose of PCV13. The PCV13 vaccine dose should be obtained 1 or more years after  the last PPSV23 vaccine dose.  Pneumococcal polysaccharide (PPSV23) vaccine. When PCV13 is also indicated, PCV13 should be obtained first. All adults aged 76 years and older should be immunized. An adult younger than age 18 years who has certain medical conditions should be immunized. Any person who resides in a nursing home or long-term care facility should be immunized. An adult smoker should be immunized. People with an immunocompromised condition and certain other conditions should receive both PCV13 and PPSV23 vaccines. People with human immunodeficiency virus (HIV) infection should be immunized as soon as possible after diagnosis. Immunization during chemotherapy or radiation therapy should be avoided. Routine use of PPSV23 vaccine is not recommended for American Indians, [place] Natives, or people younger than 65 years unless there are medical conditions that require PPSV23 vaccine. When indicated, people who have unknown immunization and have no record of immunization should receive PPSV23 vaccine. One-time revaccination 5 years after the first dose of PPSV23 is recommended for people aged 19-64 years who have chronic kidney failure, nephrotic syndrome, asplenia, or immunocompromised conditions. People who received 1-2 doses of PPSV23 before age 87 years should receive another dose of PPSV23 vaccine at age 51 years or later if at least 5 years have passed since the previous dose. Doses of PPSV23 are not needed for people immunized with PPSV23 at or after age 84 years.  Meningococcal vaccine. Adults with asplenia or persistent complement component deficiencies  should receive 2 doses of quadrivalent meningococcal conjugate (MenACWY-D) vaccine. The doses should be obtained at least 2 months apart. Microbiologists working with certain meningococcal bacteria, [place] recruits, people at risk during an outbreak, and people who travel to or live in countries with a high rate of meningitis should be immunized. A first-year college student up through age 42 years who is living in a residence hall should receive a dose if he did not receive a dose on or after his 16th birthday. Adults who have certain high-risk conditions should receive one or more doses of vaccine.  Hepatitis A vaccine. Adults who wish to be protected from this disease, have certain high-risk conditions, work with hepatitis A-infected animals, work in hepatitis A research labs, or travel to or work in countries with a high rate of hepatitis A should be immunized. Adults who were previously unvaccinated and who anticipate close contact with an international adoptee during the first 60 days after arrival in the [place] from a country with a high rate of hepatitis A should be immunized.  Hepatitis B vaccine. Adults should be immunized if they wish to be protected from this disease, have certain high-risk conditions, may be exposed to blood or other infectious body fluids, are household contacts or sex partners of hepatitis B positive people, are clients or workers in certain care facilities, or travel to or work in countries with a high rate of hepatitis B.  Haemophilus influenzae type b (Hib) vaccine. A previously unvaccinated person with asplenia or sickle cell disease or having a scheduled splenectomy should receive 1 dose of Hib vaccine. Regardless of previous immunization, a recipient of a hematopoietic stem cell transplant should receive a 3-dose series 6-12 months after his successful transplant. Hib vaccine is not recommended for adults with HIV infection. Preventive Service / Frequency Ages  3 to 44  Blood pressure check.** / Every 1 to 2 years.  Lipid and cholesterol check.** / Every 5 years beginning at age 41.  Hepatitis C blood test.** / For any individual with known risks for hepatitis C.  Skin self-exam. / Monthly.  Influenza vaccine. / Every year.  Tetanus, diphtheria, and acellular pertussis (Tdap, Td) vaccine.** / Consult your health care provider. 1 dose of Td every 10 years.  Varicella vaccine.** / Consult your health care provider.  HPV vaccine. / 3 doses over 6 months, if 12 or younger.  Measles, mumps, rubella (MMR) vaccine.** / You need at least 1 dose of MMR if you were born in 1957 or later. You may also need a second dose.  Pneumococcal 13-valent conjugate (PCV13) vaccine.** / Consult your health care provider.  Pneumococcal polysaccharide (PPSV23) vaccine.** / 1 to 2 doses if you smoke cigarettes or if you have certain conditions.  Meningococcal vaccine.** / 1 dose if you are age 62 to 68 years and a Market researcher living in a residence hall, or have one of several medical conditions. You may also need additional booster doses.  Hepatitis A vaccine.** / Consult your health care provider.  Hepatitis B vaccine.** / Consult your health care provider.  Haemophilus influenzae type b (Hib) vaccine.** / Consult your health care provider. Ages 47 to 58  Blood pressure check.** / Every 1 to 2 years.  Lipid and cholesterol check.** / Every 5 years beginning at age 66.  Lung cancer screening. / Every year if you are aged 75-80 years and have a 30-pack-year history of smoking and currently smoke or have quit within the past 15 years. Yearly screening is stopped once you have quit smoking for at least 15 years or develop a health problem that would prevent you from having lung cancer treatment.  Fecal occult blood test (FOBT) of stool. / Every year beginning at age 24 and continuing until age 5. You may not have to do this test if you get a  colonoscopy every 10 years.  Flexible sigmoidoscopy** or colonoscopy.** / Every 5 years for a flexible sigmoidoscopy or every 10 years for a colonoscopy beginning at age 69 and continuing until age 16.  Hepatitis C blood test.** / For all people born from [birth year] through 1965 and any individual with known risks for hepatitis C.  Skin self-exam. / Monthly.  Influenza vaccine. / Every year.  Tetanus, diphtheria, and acellular pertussis (Tdap/Td) vaccine.** / Consult your health care provider. 1 dose of Td every 10 years.  Varicella vaccine.** / Consult your health care provider.  Zoster vaccine.** / 1 dose for adults aged 15 years or older.  Measles, mumps, rubella (MMR) vaccine.** / You need at least 1 dose of MMR if you were born in 1955 or later. You may also need a second dose.  Pneumococcal 13-valent conjugate (PCV13) vaccine.** / Consult your health care provider.  Pneumococcal polysaccharide (PPSV23) vaccine.** / 1 to 2 doses if you smoke cigarettes or if you have certain conditions.  Meningococcal vaccine.** / Consult your health care provider.  Hepatitis A vaccine.** / Consult your health care provider.  Hepatitis B vaccine.** / Consult your health care provider.  Haemophilus influenzae type b (Hib) vaccine.** / Consult your health care provider. Ages 52 and over  Blood pressure check.** / Every 1 to 2 years.  Lipid and cholesterol check.**/ Every 5 years beginning at age 5.  Lung cancer screening. / Every year if you are aged 44-80 years and have a 30-pack-year history of smoking and currently smoke or have quit within the past 15 years. Yearly screening is stopped once you have quit smoking for at least 15 years or develop a health problem that would prevent you  from having lung cancer treatment.  Fecal occult blood test (FOBT) of stool. / Every year beginning at age 35 and continuing until age 40. You may not have to do this test if you get a colonoscopy every 10  years.  Flexible sigmoidoscopy** or colonoscopy.** / Every 5 years for a flexible sigmoidoscopy or every 10 years for a colonoscopy beginning at age 23 and continuing until age 26.  Hepatitis C blood test.** / For all people born from [birth year] through 1963 and any individual with known risks for hepatitis C.  Abdominal aortic aneurysm (AAA) screening./ Screening current or former smokers or have Hypertension.  Skin self-exam. / Monthly.  Influenza vaccine. / Every year.  Tetanus, diphtheria, and acellular pertussis (Tdap/Td) vaccine.** / 1 dose of Td every 10 years.  Varicella vaccine.** / Consult your health care provider.  Zoster vaccine.** / 1 dose for adults aged 23 years or older.  Pneumococcal 13-valent conjugate (PCV13) vaccine.** / Consult your health care provider.  Pneumococcal polysaccharide (PPSV23) vaccine.** / 1 dose for all adults aged 5 years and older.  Meningococcal vaccine.** / Consult your health care provider.  Hepatitis A vaccine.** / Consult your health care provider.  Hepatitis B vaccine.** / Consult your health care provider.  Haemophilus influenzae type b (Hib) vaccine.** / Consult your health care provider.  Health Maintenance A healthy lifestyle and preventative care can promote health and wellness. Maintain regular health, dental, and eye exams. Eat a healthy diet. Foods like vegetables, fruits, whole grains, low-fat dairy products, and lean protein foods contain the nutrients you need and are low in calories. Decrease your intake of foods high in solid fats, added sugars, and salt. Get information about a proper diet from your health care provider, if necessary. Regular physical exercise is one of the most important things you can do for your health. Most adults should get at least 150 minutes of moderate-intensity exercise (any activity that increases your heart rate and causes you to sweat) each week. In addition, most adults need muscle-strengthening  exercises on 2 or more days a week.  Maintain a healthy weight. The body mass index (BMI) is a screening tool to identify possible weight problems. It provides an estimate of body fat based on height and weight. Your health care provider can find your BMI and can help you achieve or maintain a healthy weight. For males 20 years and older: A BMI below 18.5 is considered underweight. A BMI of 18.5 to 24.9 is normal. A BMI of 25 to 29.9 is considered overweight. A BMI of 30 and above is considered obese. Maintain normal blood lipids and cholesterol by exercising and minimizing your intake of saturated fat. Eat a balanced diet with plenty of fruits and vegetables. Blood tests for lipids and cholesterol should begin at age 24 and be repeated every 5 years. If your lipid or cholesterol levels are high, you are over age 70, or you are at high risk for heart disease, you may need your cholesterol levels checked more frequently.Ongoing high lipid and cholesterol levels should be treated with medicines if diet and exercise are not working. If you smoke, find out from your health care provider how to quit. If you do not use tobacco, do not start. Lung cancer screening is recommended for adults aged 70-80 years who are at high risk for developing lung cancer because of a history of smoking. A yearly low-dose CT scan of the lungs is recommended for people who have at least a 30-pack-year  history of smoking and are current smokers or have quit within the past 15 years. A pack year of smoking is smoking an average of 1 pack of cigarettes a day for 1 year (for example, a 30-pack-year history of smoking could mean smoking 1 pack a day for 30 years or 2 packs a day for 15 years). Yearly screening should continue until the smoker has stopped smoking for at least 15 years. Yearly screening should be stopped for people who develop a health problem that would prevent them from having lung cancer treatment. If you choose to  drink alcohol, do not have more than 2 drinks per day. One drink is considered to be 12 oz (360 mL) of beer, 5 oz (150 mL) of wine, or 1.5 oz (45 mL) of liquor. Avoid the use of street drugs. Do not share needles with anyone. Ask for help if you need support or instructions about stopping the use of drugs. High blood pressure causes heart disease and increases the risk of stroke. Blood pressure should be checked at least every 1-2 years. Ongoing high blood pressure should be treated with medicines if weight loss and exercise are not effective. If you are 18-79 years old, ask your health care provider if you should take aspirin to prevent heart disease. Diabetes screening involves taking a blood sample to check your fasting blood sugar level. This should be done once every 3 years after age 16 if you are at a normal weight and without risk factors for diabetes. Testing should be considered at a younger age or be carried out more frequently if you are overweight and have at least 1 risk factor for diabetes. Colorectal cancer can be detected and often prevented. Most routine colorectal cancer screening begins at the age of 13 and continues through age 30. However, your health care provider may recommend screening at an earlier age if you have risk factors for colon cancer. On a yearly basis, your health care provider may provide home test kits to check for hidden blood in the stool. A small camera at the end of a tube may be used to directly examine the colon (sigmoidoscopy or colonoscopy) to detect the earliest forms of colorectal cancer. Talk to your health care provider about this at age 1 when routine screening begins. A direct exam of the colon should be repeated every 5-10 years through age 52, unless early forms of precancerous polyps or small growths are found. People who are at an increased risk for hepatitis B should be screened for this virus. You are considered at high risk for hepatitis B if: You  were born in a country where hepatitis B occurs often. Talk with your health care provider about which countries are considered high risk. Your parents were born in a high-risk country and you have not received a shot to protect against hepatitis B (hepatitis B vaccine). You have HIV or AIDS. You use needles to inject street drugs. You live with, or have sex with, someone who has hepatitis B. You are a man who has sex with other men (MSM). You get hemodialysis treatment. You take certain medicines for conditions like cancer, organ transplantation, and autoimmune conditions. Hepatitis C blood testing is recommended for all people born from [birth year] through 1963 and any individual with known risk factors for hepatitis C. Healthy men should no longer receive prostate-specific antigen (PSA) blood tests as part of routine cancer screening. Talk to your health care provider about prostate cancer screening. Testicular cancer  screening is not recommended for adolescents or adult males who have no symptoms. Screening includes self-exam, a health care provider exam, and other screening tests. Consult with your health care provider about any symptoms you have or any concerns you have about testicular cancer. Practice safe sex. Use condoms and avoid high-risk sexual practices to reduce the spread of sexually transmitted infections (STIs). You should be screened for STIs, including gonorrhea and chlamydia if: You are sexually active and are younger than 24 years. You are older than 24 years, and your health care provider tells you that you are at risk for this type of infection. Your sexual activity has changed since you were last screened, and you are at an increased risk for chlamydia or gonorrhea. Ask your health care provider if you are at risk. If you are at risk of being infected with HIV, it is recommended that you take a prescription medicine daily to prevent HIV infection. This is called pre-exposure  prophylaxis (PrEP). You are considered at risk if: You are a man who has sex with other men (MSM). You are a heterosexual man who is sexually active with multiple partners. You take drugs by injection. You are sexually active with a partner who has HIV. Talk with your health care provider about whether you are at high risk of being infected with HIV. If you choose to begin PrEP, you should first be tested for HIV. You should then be tested every 3 months for as long as you are taking PrEP. Use sunscreen. Apply sunscreen liberally and repeatedly throughout the day. You should seek shade when your shadow is shorter than you. Protect yourself by wearing long sleeves, pants, a wide-brimmed hat, and sunglasses year round whenever you are outdoors. Tell your health care provider of new moles or changes in moles, especially if there is a change in shape or color. Also, tell your health care provider if a mole is larger than the size of a pencil eraser. Stay current with your vaccines (immunizations).

## 2015-01-10 NOTE — Progress Notes (Signed)
Patient ID: MEKAI WILKINSON, male   DOB: May 19, 1924, 81 y.o.   MRN: [id number]  MEDICARE ANNUAL WELLNESS VISIT AND CPE  Assessment:   1. Essential hypertension  - Microalbumin / creatinine urine ratio - EKG 12-Lead  - [place], retroperitnl abd,  ltd  2. Hyperlipidemia  - Lipid panel  3. Prediabetes  - Hemoglobin A1c - Insulin, random  4. Vitamin D deficiency  - Vit D  25 hydroxy   5. Hypothyroidism  - TSH  6. Essential thrombocytosis  - f/u w/Dr Benay Spice  7. Medication management  - Urine Microscopic - CBC with Differential/Platelet - BASIC METABOLIC PANEL WITH GFR - Hepatic function panel - Magnesium  8. Screening for rectal cancer  - POC Hemoccult Bld/Stl   9. Prostate cancer screening   10. Depression screen  - Screen Negative   11. At low risk for fall   12. Routine general medical examination at a health care facility   Plan:   During the course of the visit the patient was educated and counseled about appropriate screening and preventive services including:    Pneumococcal vaccine   Influenza vaccine  Td vaccine  Screening electrocardiogram  Bone densitometry screening  Colorectal cancer screening  Diabetes screening  Glaucoma screening  Nutrition counseling   Advanced directives: requested  Screening recommendations, referrals: Vaccinations: Immunization History  Administered Date(s) Administered  . DT 12/13/2013  . DTaP 09/24/2003  . Influenza Whole 06/10/2013  . Influenza, High Dose Seasonal PF 06/21/2014  . Pneumococcal Conjugate-13 09/20/2014  . Pneumococcal Polysaccharide-23 09/23/2004  Shingles vaccine undecided Hep B vaccine not indicated  Nutrition assessed and recommended  Colonoscopy declined Recommended yearly ophthalmology/optometry visit for glaucoma screening and checkup Recommended yearly dental visit for hygiene and checkup Advanced directives - no - offered forms - states daughter is an Atty & will  procure.  Conditions/risks identified: BMI: Discussed weight loss, diet, and increase physical activity.  Increase physical activity: AHA recommends 150 minutes of physical activity a week.  Medications reviewed PreDiabetes is at goal, ACE/ARB therapy: Not Indicated Urinary Incontinence is not an issue: discussed non pharmacology and pharmacology options.  Fall risk: medium - discussed PT, home fall assessment, medications.   Subjective:    OLLIVER BOYADJIAN is a 81 y.o. MWM who presents for Medicare Annual Wellness Visit and complete physical.  Date of last medicare wellness visit was 09/20/2014.  This very nice 81 y.o. MWM presents for 3 month follow up with Hypertension, Hyperlipidemia, Pre-Diabetes and Vitamin D Deficiency.  Patient also has hx/o Primary Thrombocythemia followed by D Julieanne Manson since 2002.    Patient is treated for HTN since 1995 & BP has been controlled at home. Today's BP: (!) 116/58 mmHg. Patient has had no complaints of any cardiac type chest pain, palpitations, dyspnea/orthopnea/PND, dizziness, claudication, or dependent edema.   Hyperlipidemia is controlled with diet & meds. Patient denies myalgias or other med SE's. Last Lipids were at goal - Total Chol 132; HDL 18*; LDL  53; wit elevated Trig 305 on 09/20/2014:   Also, the patient has history of PreDiabetes with A1c 5.9% in Mar 2013 and has had no symptoms of reactive hypoglycemia, diabetic polys, paresthesias or visual blurring.  Last A1c was 5.7% on 09/20/2014.   Further, the patient also has history of Vitamin D Deficiency of 20 in 2008 and supplements vitamin D without any suspected side-effects. Last vitamin D was80 on  06/21/2014.  Names of Other Physician/Practitioners you currently use: 1. [facility] here for primary care 2. Dr Marica Otter, eye doctor, last visit Mar 2016 3. Dr Wang([place]) , dentist, last visit 01/12/2015  Patient Care Team: Unk Pinto, MD as PCP - General (Internal Medicine) Hayden Pedro, MD as Consulting Physician (Ophthalmology) Ladell Pier, MD as Consulting Physician (Oncology) Marica Otter, OD (Optometry)  Medication Review: Medication Sig  . aspirin 81 MG tab Take 81 mg by mouth daily.  Marland Kitchen atenolol  50 MG tab TAKE 1 TABLET AT BEDTIME  . VITAMIN D 2000 UNITS tab Take 2,000 Units by mouth daily. Takes 2 bid  . ferrous sulfate 325 (65 FE) MG tab Take 325 mg by mouth daily with breakfast.  . furosemide (LASIX) 40 MG tab TAKE 1 TABLET EVERY DAY  . hydroxyurea (HYDREA) 500 MG cap Take 1 capsule (500 mg total) by mouth daily.  Marland Kitchen levothyroxine  50 MCG tab TAKE 1 TABLET BY MOUTH EVERY DAY   Current Problems (verified) Patient Active Problem List   Diagnosis Date Noted  . Medication management 12/12/2013  . Hypothyroidism 12/12/2013  . Essential hypertension 09/14/2013  . Hyperlipidemia 09/14/2013  . Prediabetes 09/14/2013  . Vitamin D deficiency 09/14/2013  . Essential thrombocytosis 07/08/2012   Screening Tests Health Maintenance  Topic Date Due  . TETANUS/TDAP  12/15/1944  . COLONOSCOPY  12/16/1975  . ZOSTAVAX  12/15/1985  . INFLUENZA VACCINE  04/24/2015  . PNA vac Low Risk Adult  Completed   Immunization History  Administered Date(s) Administered  . DT 12/13/2013  . DTaP 09/24/2003  . Influenza Whole 06/10/2013  . Influenza, High Dose Seasonal PF 06/21/2014  . Pneumococcal Conjugate-13 09/20/2014  . Pneumococcal Polysaccharide-23 09/23/2004   Preventative care: Last colonoscopy: always declines - annual hemoccults have been negative  History reviewed: allergies, current medications, past family history, past medical history, past social history, past surgical history and problem list  Risk Factors: Tobacco History  Substance Use Topics  . Smoking status: Former Smoker    Quit date: 09/24/1975  . Smokeless tobacco: Current User  . Alcohol Use: No   He does not smoke.  Patient  is a former smoker. Are there smokers in your home (other than you)?  No  Alcohol Current alcohol use: none  Caffeine Current caffeine use: coffee 2 cups /day  Exercise Current exercise: gardening, housecleaning and yard work  Nutrition/Diet Current diet: in general, a "healthy" diet    Cardiac risk factors: advanced age (older than 87 for men, 53 for women), dyslipidemia, hypertension, male gender and smoking/ tobacco exposure.  Depression Screen (Note: if answer to either of the following is "Yes", a more complete depression screening is indicated)   Q1: Over the past two weeks, have you felt down, depressed or hopeless? No  Q2: Over the past two weeks, have you felt little interest or pleasure in doing things? No  Have you lost interest or pleasure in daily life? No  Do you often feel hopeless? No  Do you cry easily over simple problems? No  Activities of Daily Living In your present state of health, do you have any difficulty performing the following activities?:  Driving? No Managing money?  No Feeding yourself? No Getting from bed to chair? No Climbing a flight of stairs? No Preparing food and eating?: No Bathing or showering? No Getting dressed: No Getting to the toilet? No Using the toilet:No Moving around from place to place: No In the past year have you fallen or had a near fall?:No   Are  you sexually active?  No  Do you have more than one partner?  No  Vision Difficulties: No  Hearing Difficulties: No Do you often ask people to speak up or repeat themselves? No Do you experience ringing or noises in your ears? No Do you have difficulty understanding soft or whispered voices? No  Cognition  Do you feel that you have a problem with memory?No  Do you often misplace items? No  Do you feel safe at home?  Yes  Advanced directives Does patient have a R[place]y? No Does patient have a Living Will? No  Past Medical History  Diagnosis  Date  . Hypertension   . Hyperlipidemia   . Pre-diabetes   . Thyroid disease   . Thrombocytosis   . GERD (gastroesophageal reflux disease)   . Vitamin D deficiency    ROS: Constitutional: Denies fever, chills, weight loss/gain, headaches, insomnia, fatigue, night sweats or change in appetite. Eyes: Denies redness, blurred vision, diplopia, discharge, itchy or watery eyes.  ENT: Denies discharge, congestion, post nasal drip, epistaxis, sore throat, earache, hearing loss, dental pain, Tinnitus, Vertigo, Sinus pain or snoring.  Cardio: Denies chest pain, palpitations, irregular heartbeat, syncope, dyspnea, diaphoresis, orthopnea, PND, claudication or edema Respiratory: denies cough, dyspnea, DOE, pleurisy, hoarseness, laryngitis or wheezing.  Gastrointestinal: Denies dysphagia, heartburn, reflux, water brash, pain, cramps, nausea, vomiting, bloating, diarrhea, constipation, hematemesis, melena, hematochezia, jaundice or hemorrhoids Genitourinary: Denies dysuria, frequency, urgency, nocturia, hesitancy, discharge, hematuria or flank pain Musculoskeletal: Denies arthralgia, myalgia, stiffness, Jt. Swelling, pain, limp or strain/sprain. Denies Falls. Skin: Denies puritis, rash, hives, warts, acne, eczema or change in skin lesion Neuro: No weakness, tremor, incoordination, spasms, paresthesia or pain Psychiatric: Denies confusion, memory loss or sensory loss. Denies Depression. Endocrine: Denies change in weight, skin, hair change, nocturia, and paresthesia, diabetic polys, visual blurring or hyper / hypo glycemic episodes.  Heme/Lymph: No excessive bleeding, bruising or enlarged lymph nodes.  Objective:     BP 116/58   Pulse 68  Temp 98.4 F  Resp 16  Ht 5' 9.75" Wt 185 lb     BMI 26.72   General Appearance:  Alert  WD/WN, male  in no apparent distress. Eyes: PERRLA, EOMs nl, conjunctiva normal, normal fundi and vessels. Sinuses: No frontal/maxillary tenderness ENT/Mouth: EACs patent /  TMs  nl. Nares clear without erythema, swelling, mucoid exudates. Oral hygiene is good. No erythema, swelling, or exudate. Tongue normal, non-obstructing. Tonsils not swollen or erythematous. Hearing normal.  Neck: Supple, thyroid normal. No bruits, nodes or JVD. Respiratory: Respiratory effort normal.  BS equal and clear bilateral without rales, rhonci, wheezing or stridor. Cardio: Heart sounds are normal with regular rate and rhythm and no murmurs, rubs or gallops. Peripheral pulses are normal and equal bilaterally without edema. No aortic or femoral bruits. Chest: symmetric with normal excursions and percussion.  Abdomen: Flat, soft, with nl bowel sounds. Nontender, no guarding, rebound, hernias, masses, or organomegaly.  Lymphatics: Non tender without lymphadenopathy.  Genitourinary: No hernias.Testes nl. DRE - prostate nl for age - smooth & firm w/o nodules. Musculoskeletal: Full ROM all peripheral extremities, joint stability, 5/5 strength, and normal gait. Skin: Warm and dry without rashes, lesions, cyanosis, clubbing or  ecchymosis.  Neuro: Cranial nerves intact, reflexes equal bilaterally. Normal muscle tone, no cerebellar symptoms. Sensation intact.  Pysch: Alert and oriented X 3 with normal affect, insight and judgment appropriate.   Cognitive Testing  Alert? Yes  Normal Appearance? Yes  Oriented to person? Yes  Place? Yes   Time? Yes  Recall of three objects?  Yes  Can perform simple calculations? Yes  Displays appropriate judgment? Yes  Can read the correct time from a watch/clock? Yes  Medicare Attestation I have personally reviewed: The patient's medical and social history Their use of alcohol, tobacco or illicit drugs Their current medications and supplements The patient's functional ability including ADLs,fall risks, home safety risks, cognitive, and hearing and visual impairment Diet and physical activities Evidence for depression or mood disorders  The patient's  weight, height, BMI, and visual acuity have been recorded in the chart.  I have made referrals, counseling, and provided education to the patient based on review of the above and I have provided the patient with a written personalized care plan for preventive services.  Over 40 minutes of exam, counseling, chart review was performed.   Mattalyn Anderegg DAVID, MD   01/10/2015

## 2015-01-11 LAB — BASIC METABOLIC PANEL WITH GFR
BUN: 24 mg/dL — ABNORMAL HIGH (ref 6–23)
CHLORIDE: 102 meq/L (ref 96–112)
CO2: 25 meq/L (ref 19–32)
CREATININE: 1.31 mg/dL (ref 0.50–1.35)
Calcium: 8.9 mg/dL (ref 8.4–10.5)
GFR, Est African American: 55 mL/min — ABNORMAL LOW
GFR, Est Non African American: 48 mL/min — ABNORMAL LOW
GLUCOSE: 106 mg/dL — AB (ref 70–99)
Potassium: 4.9 mEq/L (ref 3.5–5.3)
Sodium: 137 mEq/L (ref 135–145)

## 2015-01-11 LAB — CBC WITH DIFFERENTIAL/PLATELET
BASOS ABS: 0.1 10*3/uL (ref 0.0–0.1)
BASOS PCT: 1 % (ref 0–1)
Eosinophils Absolute: 0 10*3/uL (ref 0.0–0.7)
Eosinophils Relative: 0 % (ref 0–5)
HEMATOCRIT: 29.9 % — AB (ref 39.0–52.0)
HEMOGLOBIN: 9.7 g/dL — AB (ref 13.0–17.0)
Lymphocytes Relative: 10 % — ABNORMAL LOW (ref 12–46)
Lymphs Abs: 0.8 10*3/uL (ref 0.7–4.0)
MCH: 32.6 pg (ref 26.0–34.0)
MCHC: 32.4 g/dL (ref 30.0–36.0)
MCV: 100.3 fL — ABNORMAL HIGH (ref 78.0–100.0)
MONOS PCT: 7 % (ref 3–12)
MPV: 9.6 fL (ref 8.6–12.4)
Monocytes Absolute: 0.5 10*3/uL (ref 0.1–1.0)
NEUTROS ABS: 6.3 10*3/uL (ref 1.7–7.7)
NEUTROS PCT: 82 % — AB (ref 43–77)
PLATELETS: 427 10*3/uL — AB (ref 150–400)
RBC: 2.98 MIL/uL — ABNORMAL LOW (ref 4.22–5.81)
RDW: 25.8 % — AB (ref 11.5–15.5)
WBC: 7.7 10*3/uL (ref 4.0–10.5)

## 2015-01-11 LAB — HEPATIC FUNCTION PANEL
ALK PHOS: 46 U/L (ref 39–117)
ALT: 16 U/L (ref 0–53)
AST: 21 U/L (ref 0–37)
Albumin: 4.1 g/dL (ref 3.5–5.2)
BILIRUBIN DIRECT: 0.1 mg/dL (ref 0.0–0.3)
BILIRUBIN TOTAL: 0.7 mg/dL (ref 0.2–1.2)
Indirect Bilirubin: 0.6 mg/dL (ref 0.2–1.2)
TOTAL PROTEIN: 6 g/dL (ref 6.0–8.3)

## 2015-01-11 LAB — MICROALBUMIN / CREATININE URINE RATIO
Creatinine, Urine: 58.2 mg/dL
Microalb Creat Ratio: 6.9 mg/g (ref 0.0–30.0)
Microalb, Ur: 0.4 mg/dL (ref <2.0)

## 2015-01-11 LAB — URINALYSIS, MICROSCOPIC ONLY
Bacteria, UA: NONE SEEN
CRYSTALS: NONE SEEN
Casts: NONE SEEN
SQUAMOUS EPITHELIAL / LPF: NONE SEEN

## 2015-01-11 LAB — VITAMIN D 25 HYDROXY (VIT D DEFICIENCY, FRACTURES): VIT D 25 HYDROXY: 66 ng/mL (ref 30–100)

## 2015-01-11 LAB — LIPID PANEL
Cholesterol: 125 mg/dL (ref 0–200)
HDL: 23 mg/dL — ABNORMAL LOW (ref >=40)
LDL CALC: 74 mg/dL (ref 0–99)
TRIGLYCERIDES: 138 mg/dL (ref <150)
Total CHOL/HDL Ratio: 5.4 Ratio
VLDL: 28 mg/dL (ref 0–40)

## 2015-01-11 LAB — HEMOGLOBIN A1C
Hgb A1c MFr Bld: 5.2 % (ref <5.7)
Mean Plasma Glucose: 103 mg/dL (ref <117)

## 2015-01-11 LAB — MAGNESIUM: Magnesium: 2.1 mg/dL (ref 1.5–2.5)

## 2015-01-11 LAB — TSH: TSH: 2.893 u[IU]/mL (ref 0.350–4.500)

## 2015-01-11 LAB — INSULIN, RANDOM: Insulin: 29.4 u[IU]/mL — ABNORMAL HIGH (ref 2.0–19.6)

## 2015-01-17 ENCOUNTER — Other Ambulatory Visit: Payer: Self-pay | Admitting: Oncology

## 2015-01-19 ENCOUNTER — Other Ambulatory Visit (HOSPITAL_BASED_OUTPATIENT_CLINIC_OR_DEPARTMENT_OTHER): Payer: Medicare Other

## 2015-01-19 DIAGNOSIS — D473 Essential (hemorrhagic) thrombocythemia: Secondary | ICD-10-CM | POA: Diagnosis present

## 2015-01-19 LAB — CBC WITH DIFFERENTIAL/PLATELET
BASO%: 0.4 % (ref 0.0–2.0)
Basophils Absolute: 0 10*3/uL (ref 0.0–0.1)
EOS%: 0.7 % (ref 0.0–7.0)
Eosinophils Absolute: 0.1 10*3/uL (ref 0.0–0.5)
HCT: 29.5 % — ABNORMAL LOW (ref 38.4–49.9)
HGB: 9.2 g/dL — ABNORMAL LOW (ref 13.0–17.1)
LYMPH%: 5.4 % — AB (ref 14.0–49.0)
MCH: 33.2 pg (ref 27.2–33.4)
MCHC: 31.2 g/dL — ABNORMAL LOW (ref 32.0–36.0)
MCV: 106.5 fL — ABNORMAL HIGH (ref 79.3–98.0)
MONO#: 0.6 10*3/uL (ref 0.1–0.9)
MONO%: 7.5 % (ref 0.0–14.0)
NEUT#: 6.5 10*3/uL (ref 1.5–6.5)
NEUT%: 86 % — ABNORMAL HIGH (ref 39.0–75.0)
NRBC: 2 % — AB (ref 0–0)
Platelets: 447 10*3/uL — ABNORMAL HIGH (ref 140–400)
RBC: 2.77 10*6/uL — AB (ref 4.20–5.82)
RDW: 25.1 % — ABNORMAL HIGH (ref 11.0–14.6)
WBC: 7.6 10*3/uL (ref 4.0–10.3)
lymph#: 0.4 10*3/uL — ABNORMAL LOW (ref 0.9–3.3)

## 2015-01-19 LAB — TECHNOLOGIST REVIEW

## 2015-01-20 ENCOUNTER — Telehealth: Payer: Self-pay | Admitting: *Deleted

## 2015-01-20 DIAGNOSIS — D473 Essential (hemorrhagic) thrombocythemia: Secondary | ICD-10-CM

## 2015-01-20 NOTE — Telephone Encounter (Signed)
-----   Message from Ladell Pier, MD sent at 01/19/2015  5:54 PM EDT ----- Please call patient, continue same hydrea,hb slightly lower,check cbc 6 weeks

## 2015-01-20 NOTE — Telephone Encounter (Signed)
Per Dr. Benay Spice; notified pt to continue same hydrea, hb slightly lower at 9.2; will check cbc in 6 weeks.  Pt verbalized understanding of information.

## 2015-02-06 ENCOUNTER — Other Ambulatory Visit: Payer: Self-pay | Admitting: Internal Medicine

## 2015-02-19 ENCOUNTER — Other Ambulatory Visit: Payer: Self-pay | Admitting: Oncology

## 2015-03-03 ENCOUNTER — Other Ambulatory Visit (HOSPITAL_BASED_OUTPATIENT_CLINIC_OR_DEPARTMENT_OTHER): Payer: Medicare Other

## 2015-03-03 DIAGNOSIS — D473 Essential (hemorrhagic) thrombocythemia: Secondary | ICD-10-CM | POA: Diagnosis present

## 2015-03-03 LAB — TECHNOLOGIST REVIEW

## 2015-03-03 LAB — CBC WITH DIFFERENTIAL/PLATELET
BASO%: 1 % (ref 0.0–2.0)
Basophils Absolute: 0.1 10*3/uL (ref 0.0–0.1)
EOS ABS: 0.1 10*3/uL (ref 0.0–0.5)
EOS%: 1 % (ref 0.0–7.0)
HCT: 31.5 % — ABNORMAL LOW (ref 38.4–49.9)
HEMOGLOBIN: 10.1 g/dL — AB (ref 13.0–17.1)
LYMPH#: 0.7 10*3/uL — AB (ref 0.9–3.3)
LYMPH%: 8.9 % — AB (ref 14.0–49.0)
MCH: 33.2 pg (ref 27.2–33.4)
MCHC: 32.2 g/dL (ref 32.0–36.0)
MCV: 103.1 fL — AB (ref 79.3–98.0)
MONO#: 0.6 10*3/uL (ref 0.1–0.9)
MONO%: 7.1 % (ref 0.0–14.0)
NEUT#: 6.5 10*3/uL (ref 1.5–6.5)
NEUT%: 82 % — AB (ref 39.0–75.0)
Platelets: 429 10*3/uL — ABNORMAL HIGH (ref 140–400)
RBC: 3.06 10*6/uL — ABNORMAL LOW (ref 4.20–5.82)
RDW: 26.6 % — AB (ref 11.0–14.6)
WBC: 8 10*3/uL (ref 4.0–10.3)

## 2015-03-06 ENCOUNTER — Telehealth: Payer: Self-pay | Admitting: *Deleted

## 2015-03-06 NOTE — Telephone Encounter (Signed)
Called pt with instructions to continue same dose of Hydrea. Pt confirms he is taking 500 mg daily. HGB and PLT are OK, per Dr. Benay Spice. Pt voiced understanding.

## 2015-03-06 NOTE — Telephone Encounter (Signed)
-----   Message from Ladell Pier, MD sent at 03/05/2015  4:55 PM EDT ----- Please call patient, hb and platelets are ok, continue  Hydrea, f/u as scheduled

## 2015-03-13 ENCOUNTER — Emergency Department (HOSPITAL_COMMUNITY)
Admission: EM | Admit: 2015-03-13 | Discharge: 2015-03-13 | Disposition: A | Discharge status: Home / Self Care | Payer: Medicare Other | Attending: Emergency Medicine | Admitting: Emergency Medicine

## 2015-03-13 ENCOUNTER — Encounter (HOSPITAL_COMMUNITY): Payer: Self-pay | Admitting: *Deleted

## 2015-03-13 DIAGNOSIS — E559 Vitamin D deficiency, unspecified: Secondary | ICD-10-CM | POA: Insufficient documentation

## 2015-03-13 DIAGNOSIS — R040 Epistaxis: Secondary | ICD-10-CM | POA: Diagnosis not present

## 2015-03-13 DIAGNOSIS — I10 Essential (primary) hypertension: Secondary | ICD-10-CM | POA: Insufficient documentation

## 2015-03-13 DIAGNOSIS — D473 Essential (hemorrhagic) thrombocythemia: Secondary | ICD-10-CM | POA: Insufficient documentation

## 2015-03-13 DIAGNOSIS — Z7982 Long term (current) use of aspirin: Secondary | ICD-10-CM | POA: Diagnosis not present

## 2015-03-13 DIAGNOSIS — Z87891 Personal history of nicotine dependence: Secondary | ICD-10-CM | POA: Insufficient documentation

## 2015-03-13 DIAGNOSIS — Z79899 Other long term (current) drug therapy: Secondary | ICD-10-CM | POA: Insufficient documentation

## 2015-03-13 DIAGNOSIS — E079 Disorder of thyroid, unspecified: Secondary | ICD-10-CM | POA: Diagnosis not present

## 2015-03-13 LAB — CBC
HCT: 33.5 % — ABNORMAL LOW (ref 39.0–52.0)
Hemoglobin: 10.2 g/dL — ABNORMAL LOW (ref 13.0–17.0)
MCH: 31.9 pg (ref 26.0–34.0)
MCHC: 30.4 g/dL (ref 30.0–36.0)
MCV: 104.7 fL — ABNORMAL HIGH (ref 78.0–100.0)
Platelets: 403 10*3/uL — ABNORMAL HIGH (ref 150–400)
RBC: 3.2 MIL/uL — ABNORMAL LOW (ref 4.22–5.81)
RDW: 23.3 % — ABNORMAL HIGH (ref 11.5–15.5)
WBC: 9.6 10*3/uL (ref 4.0–10.5)

## 2015-03-13 MED ORDER — OXYMETAZOLINE HCL 0.05 % NA SOLN
1.0000 | Freq: Once | NASAL | Status: AC
  Administered 2015-03-13: 1 via NASAL
  Filled 2015-03-13: qty 15

## 2015-03-13 NOTE — ED Notes (Signed)
Awake. Verbally responsive. A/O x4. Resp even and unlabored. No audible adventitious breath sounds noted. ABC's intact.  

## 2015-03-13 NOTE — Discharge Instructions (Signed)
Nosebleed °A nosebleed can be caused by many things, including: °· Getting hit hard in the nose. °· Infections. °· Dry nose. °· Colds. °· Medicines. °Your doctor may do lab testing if you get nosebleeds a lot and the cause is not known. °HOME CARE  °· If your nose was packed with material, keep it there until your doctor takes it out. Put the pack back in your nose if the pack falls out. °· Do not blow your nose for 12 hours after the nosebleed. °· Sit up and bend forward if your nose starts bleeding again. Pinch the front half of your nose nonstop for 20 minutes. °· Put petroleum jelly inside your nose every morning if you have a dry nose. °· Use a humidifier to make the air less dry. °· Do not take aspirin. °· Try not to strain, lift, or bend at the waist for many days after the nosebleed. °GET HELP RIGHT AWAY IF:  °· Nosebleeds keep happening and are hard to stop or control. °· You have bleeding or bruises that are not normal on other parts of the body. °· You have a fever. °· The nosebleeds get worse. °· You get lightheaded, feel faint, sweaty, or throw up (vomit) blood. °MAKE SURE YOU:  °· Understand these instructions. °· Will watch your condition. °· Will get help right away if you are not doing well or get worse. °Document Released: 06/18/2008 Document Revised: 12/02/2011 Document Reviewed: 06/18/2008 °[employer]® Patient Information ©2015 [employer]. This information is not intended to replace advice given to you by your health care provider. Make sure you discuss any questions you have with your health care provider. ° °

## 2015-03-13 NOTE — ED Notes (Signed)
At least 1 inch blood clot from pts right nares came out when rn changed washcloth.

## 2015-03-13 NOTE — ED Notes (Signed)
Pt reports he is still spitting up blood.

## 2015-03-13 NOTE — ED Notes (Signed)
Pt nose started bleeding with rapid rhino in place. md made aware. md at bedside

## 2015-03-13 NOTE — ED Notes (Signed)
Pt alert and oriented x4. Respirations even and unlabored, bilateral symmetrical rise and fall of chest. Skin warm and dry. In no acute distress. Denies needs.   

## 2015-03-13 NOTE — ED Notes (Signed)
md at bedside

## 2015-03-13 NOTE — ED Provider Notes (Signed)
CSN: [id number]     Arrival date & time 03/13/15  1137 History   First MD Initiated Contact with Patient 03/13/15 1239     Chief Complaint  Patient presents with  . Epistaxis     (Consider location/radiation/quality/duration/timing/severity/associated sxs/prior Treatment) Patient is a 81 y.o. male presenting with nosebleeds. The history is provided by the patient.  Epistaxis Location:  R nare Severity:  Mild Duration:  4 hours Timing:  Intermittent Progression:  Unchanged Chronicity:  Recurrent Context comment:  Unknown Relieved by:  Nothing Worsened by:  Nothing tried Ineffective treatments:  Ice and applying pressure Associated symptoms: no cough, no fever and no headaches     Past Medical History  Diagnosis Date  . Hypertension   . Hyperlipidemia   . Pre-diabetes   . Thyroid disease   . Thrombocytosis   . GERD (gastroesophageal reflux disease)   . Vitamin D deficiency    History reviewed. No pertinent past surgical history. Family History  Problem Relation Age of Onset  . Hypertension Father   . Heart disease Father    History  Substance Use Topics  . Smoking status: Former Smoker    Quit date: 09/24/1975  . Smokeless tobacco: Current User  . Alcohol Use: No    Review of Systems  Constitutional: Negative for fever.  HENT: Positive for nosebleeds. Negative for drooling and rhinorrhea.   Eyes: Negative for pain.  Respiratory: Negative for cough and shortness of breath.   Cardiovascular: Negative for chest pain and leg swelling.  Gastrointestinal: Negative for nausea, vomiting, abdominal pain and diarrhea.  Genitourinary: Negative for dysuria and hematuria.  Musculoskeletal: Negative for gait problem and neck pain.  Skin: Negative for color change.  Neurological: Negative for numbness and headaches.  Hematological: Negative for adenopathy.  Psychiatric/Behavioral: Negative for behavioral problems.  All other systems reviewed and are  negative.     Allergies  Ace inhibitors  Home Medications   Prior to Admission medications   Medication Sig Start Date End Date Taking? Authorizing Provider  aspirin 81 MG tablet Take 81 mg by mouth daily.   Yes Historical Provider, MD  atenolol (TENORMIN) 50 MG tablet TAKE 1 TABLET AT BEDTIME Patient taking differently: TAKE 50MG  BY MOUTH AT BEDTIME. 06/03/14  Yes Unk Pinto, MD  Cholecalciferol (VITAMIN D) 2000 UNITS tablet Take 2,000 Units by mouth daily.    Yes Historical Provider, MD  ferrous sulfate 325 (65 FE) MG tablet Take 325 mg by mouth daily with breakfast.   Yes Historical Provider, MD  furosemide (LASIX) 40 MG tablet TAKE 1 TABLET EVERY DAY Patient taking differently: TAKE 20 MG BY MOUTH DAILY 03/28/14  Yes Unk Pinto, MD  hydroxyurea (HYDREA) 500 MG capsule TAKE 1 CAPSULE (500 MG TOTAL) BY MOUTH DAILY. 02/21/15  Yes Ladell Pier, MD  levothyroxine (SYNTHROID, LEVOTHROID) 50 MCG tablet TAKE 1 TABLET BY MOUTH EVERY DAY Patient taking differently: TAKE 50 MCG BY MOUTH DAILY. 02/06/15  Yes Vicie Mutters, PA-C   BP 117/51 mmHg  Pulse 65  Temp(Src) 98 F (36.7 C) (Oral)  Resp 16  SpO2 99% Physical Exam  Constitutional: He is oriented to person, place, and time. He appears well-developed and well-nourished.  HENT:  Head: Normocephalic and atraumatic.  Right Ear: External ear normal.  Left Ear: External ear normal.  Nose: Nose normal.  Mouth/Throat: Oropharynx is clear and moist. No oropharyngeal exudate.  No obvious source of bleeding from the right nare. There does appear to be a slight trickle of blood  down the posterior oropharynx.  Eyes: Conjunctivae and EOM are normal. Pupils are equal, round, and reactive to light.  Neck: Normal range of motion. Neck supple.  Cardiovascular: Normal rate, regular rhythm, normal heart sounds and intact distal pulses.  Exam reveals no gallop and no friction rub.   No murmur heard. Pulmonary/Chest: Effort normal and breath  sounds normal. No respiratory distress. He has no wheezes.  Abdominal: Soft. Bowel sounds are normal. He exhibits no distension. There is no tenderness. There is no rebound and no guarding.  Musculoskeletal: Normal range of motion. He exhibits no edema or tenderness.  Neurological: He is alert and oriented to person, place, and time.  Skin: Skin is warm and dry.  Psychiatric: He has a normal mood and affect. His behavior is normal.  Nursing note and vitals reviewed.   ED Course  EPISTAXIS MANAGEMENT Date/Time: 03/14/2015 7:13 AM Performed by: Pamella Pert Authorized by: Pamella Pert Consent: Verbal consent obtained. Risks and benefits: risks, benefits and alternatives were discussed Consent given by: patient Patient understanding: patient states understanding of the procedure being performed Required items: required blood products, implants, devices, and special equipment available Patient identity confirmed: verbally with patient, arm band, provided demographic data and hospital-assigned identification number Time out: Immediately prior to procedure a "time out" was called to verify the correct patient, procedure, equipment, support staff and site/side marked as required. Preparation: Patient was prepped and draped in the usual sterile fashion. Patient sedated: no Treatment site: right anterior Repair method: nasal balloon Post-procedure assessment: bleeding stopped Treatment complexity: simple Recurrence: recurrence of recent bleed Patient tolerance: Patient tolerated the procedure well with no immediate complications   (including critical care time) Labs Review Labs Reviewed  CBC - Abnormal; Notable for the following:    RBC 3.20 (*)    Hemoglobin 10.2 (*)    HCT 33.5 (*)    MCV 104.7 (*)    RDW 23.3 (*)    Platelets 403 (*)    All other components within normal limits    Imaging Review No results found.   EKG Interpretation None      MDM   Final  diagnoses:  Epistaxis, recurrent    12:50 PM 81 y.o. male w hx of HTN, pre-DM, thrombocytosis on hydroxyurea who presents with recurrent epistaxis from the right near. He notes he awoke to the nosebleeding or not and this morning and it has been mostly continuous since that time with some brief episodes where it seemed to stop. He was seen here several months ago with bleeding from the right near and treated with a nasal tampon. Vital signs unremarkable here. Will utilize Afrin and pressure and reassess.  Bleeding has not stopped w/ routine measures. Will use ant rhino rocket.   Epistaxis controlled w/ anterior rhino-rocket. Pt monitored and re-examined multiple times. Discussed abx with him and how there is no evidence to their utility. He understands.  I have discussed the diagnosis/risks/treatment options with the patient and believe the pt to be eligible for discharge home to follow-up with ENT in 48-72 hrs. We also discussed returning to the ED immediately if new or worsening sx occur. We discussed the sx which are most concerning (e.g., recurrent bleeding, facial pain, fever, lightheadedness, sob, fatigue) that necessitate immediate return. Medications administered to the patient during their visit and any new prescriptions provided to the patient are listed below.  Medications given during this visit Medications  oxymetazoline (AFRIN) 0.05 % nasal spray 1 spray (1 spray Each Nare  Given 03/13/15 1243)    Discharge Medication List as of 03/13/2015  3:54 PM       Pamella Pert, MD 03/14/15 [phone]

## 2015-03-13 NOTE — ED Notes (Addendum)
Pt complains of nose bleed for the past 3 hours. Pt states he is on aspirin 81mg . Pt denies nose injury, states the bleeding started spontaneously upon waking. Pt states he had the same problem 3 months ago.

## 2015-03-13 NOTE — ED Notes (Signed)
md at bedside  Rapid rhino placed

## 2015-03-24 ENCOUNTER — Telehealth: Payer: Self-pay | Admitting: Oncology

## 2015-03-24 NOTE — Telephone Encounter (Signed)
S/w pt confirming labs/ov r/s per provider schedule, mailed out schedule.Marland Kitchen KJ

## 2015-04-12 ENCOUNTER — Encounter: Payer: Self-pay | Admitting: Internal Medicine

## 2015-04-12 ENCOUNTER — Ambulatory Visit (INDEPENDENT_AMBULATORY_CARE_PROVIDER_SITE_OTHER): Payer: Medicare Other | Admitting: Internal Medicine

## 2015-04-12 VITALS — BP 136/62 | HR 72 | Temp 98.2°F | Resp 16 | Ht 69.75 in | Wt 187.0 lb

## 2015-04-12 DIAGNOSIS — E782 Mixed hyperlipidemia: Secondary | ICD-10-CM

## 2015-04-12 DIAGNOSIS — E559 Vitamin D deficiency, unspecified: Secondary | ICD-10-CM

## 2015-04-12 DIAGNOSIS — E039 Hypothyroidism, unspecified: Secondary | ICD-10-CM

## 2015-04-12 DIAGNOSIS — R7309 Other abnormal glucose: Secondary | ICD-10-CM | POA: Diagnosis not present

## 2015-04-12 DIAGNOSIS — I10 Essential (primary) hypertension: Secondary | ICD-10-CM | POA: Diagnosis not present

## 2015-04-12 DIAGNOSIS — R7303 Prediabetes: Secondary | ICD-10-CM

## 2015-04-12 DIAGNOSIS — Z79899 Other long term (current) drug therapy: Secondary | ICD-10-CM

## 2015-04-12 NOTE — Progress Notes (Signed)
Patient ID: Jack Schultz, male   DOB: 03-Nov-1923, 81 y.o.   MRN: [id number]  Assessment and Plan:   Patient defers any blood work today as he is due to see Dr. Ammie Dalton next week to have some done there.   Hypertension:  Continue medication,   -monitor blood pressure at home.  -Continue DASH diet.   -Reminder to go to the ER if any CP, SOB, nausea, dizziness, severe HA, changes vision/speech, left arm numbness and tingling, and jaw pain.  Cholesterol: -Continue diet and exercise.    Pre-diabetes: -Continue diet and exercise.    Vitamin D Def: -continue medications.   Nosebleeds -pinch nose at home for 45 min.  IF no relief go to the ER.  Can use afrin prn to help stop nose bleeds.    Continue diet and meds as discussed. Further disposition pending results of labs.  HPI 81 y.o. male  presents for 3 month follow up with hypertension, hyperlipidemia, prediabetes and vitamin D.   His blood pressure has been controlled at home, today their BP is BP: 136/62 mmHg.   He does workout. He denies chest pain, shortness of breath, dizziness.   He is not on cholesterol medication and denies myalgias. His cholesterol is at goal. The cholesterol last visit was:   Lab Results  Component Value Date   CHOL 125 01/10/2015   HDL 23* 01/10/2015   LDLCALC 74 01/10/2015   TRIG 138 01/10/2015   CHOLHDL 5.4 01/10/2015     He has been working on diet and exercise for prediabetes, and denies foot ulcerations, hyperglycemia, hypoglycemia , increased appetite, nausea, paresthesia of the feet, polydipsia, polyuria, visual disturbances, vomiting and weight loss. Last A1C in the office was:  Lab Results  Component Value Date   HGBA1C 5.2 01/10/2015    Patient is on Vitamin D supplement.  Lab Results  Component Value Date   VD25OH 66 01/10/2015     He does report that he has continued to have an issue with nose bleeds and he has had to go see an ENT doctor to remove packing from an ER visit.     Current Medications:  Current Outpatient Prescriptions on File Prior to Visit  Medication Sig Dispense Refill  . aspirin 81 MG tablet Take 81 mg by mouth daily.    Marland Kitchen atenolol (TENORMIN) 50 MG tablet TAKE 1 TABLET AT BEDTIME (Patient taking differently: TAKE 50MG  BY MOUTH AT BEDTIME.) 90 tablet 4  . Cholecalciferol (VITAMIN D) 2000 UNITS tablet Take 2,000 Units by mouth daily.     . ferrous sulfate 325 (65 FE) MG tablet Take 325 mg by mouth daily with breakfast.    . furosemide (LASIX) 40 MG tablet TAKE 1 TABLET EVERY DAY (Patient taking differently: TAKE 20 MG BY MOUTH DAILY) 90 tablet 2  . hydroxyurea (HYDREA) 500 MG capsule TAKE 1 CAPSULE (500 MG TOTAL) BY MOUTH DAILY. 30 capsule 1  . levothyroxine (SYNTHROID, LEVOTHROID) 50 MCG tablet TAKE 1 TABLET BY MOUTH EVERY DAY (Patient taking differently: TAKE 50 MCG BY MOUTH DAILY.) 90 tablet 3   No current facility-administered medications on file prior to visit.    Medical History:  Past Medical History  Diagnosis Date  . Hypertension   . Hyperlipidemia   . Pre-diabetes   . Thyroid disease   . Thrombocytosis   . GERD (gastroesophageal reflux disease)   . Vitamin D deficiency     Allergies:  Allergies  Allergen Reactions  . Ace Inhibitors Cough  Review of Systems:  Review of Systems  Constitutional: Negative for fever, chills and malaise/fatigue.  HENT: Positive for nosebleeds. Negative for congestion, ear pain and sore throat.   Eyes: Negative.   Respiratory: Negative for cough, shortness of breath and wheezing.   Cardiovascular: Negative for chest pain, palpitations, claudication and leg swelling.  Gastrointestinal: Negative for heartburn, nausea, vomiting, diarrhea, constipation, blood in stool and melena.  Genitourinary: Negative.   Neurological: Negative for dizziness, sensory change, loss of consciousness and headaches.  Psychiatric/Behavioral: Negative for depression. The patient is not nervous/anxious and does not  have insomnia.     Family history- Review and unchanged  Social history- Review and unchanged  Physical Exam: BP 136/62 mmHg  Pulse 72  Temp(Src) 98.2 F (36.8 C) (Temporal)  Resp 16  Ht 5' 9.75" (1.772 m)  Wt 187 lb (84.823 kg)  BMI 27.01 kg/m2 Wt Readings from Last 3 Encounters:  04/12/15 187 lb (84.823 kg)  01/10/15 185 lb (83.915 kg)  12/20/14 183 lb 9.6 oz (83.28 kg)    General Appearance: Well nourished well developed, in no apparent distress. Eyes: PERRLA, EOMs, conjunctiva no swelling or erythema ENT/Mouth: Ear canals normal without obstruction, swelling, erythma, discharge.  TMs normal bilaterally.  Oropharynx moist, clear, without exudate, or postoropharyngeal swelling. Neck: Supple, thyroid normal,no cervical adenopathy  Respiratory: Respiratory effort normal, Breath sounds clear A&P without rhonchi, wheeze, or rale.  No retractions, no accessory usage. Cardio: RRR with no MRGs. Brisk peripheral pulses without edema.  Abdomen: Soft, + BS,  Non tender, no guarding, rebound, hernias, masses. Musculoskeletal: Full ROM, 5/5 strength, Normal gait Skin: Warm, dry without rashes, lesions, ecchymosis.  Neuro: Awake and oriented X 3, Cranial nerves intact. Normal muscle tone, no cerebellar symptoms. Psych: Normal affect, Insight and Judgment appropriate.    Starlyn Skeans, PA-C 3:20 PM [facility]

## 2015-04-18 ENCOUNTER — Ambulatory Visit: Payer: Medicare Other | Admitting: Oncology

## 2015-04-18 ENCOUNTER — Other Ambulatory Visit: Payer: Medicare Other

## 2015-04-20 ENCOUNTER — Ambulatory Visit: Payer: Medicare Other | Admitting: Oncology

## 2015-04-20 ENCOUNTER — Other Ambulatory Visit: Payer: Self-pay | Admitting: Oncology

## 2015-04-20 ENCOUNTER — Other Ambulatory Visit: Payer: Medicare Other

## 2015-04-20 ENCOUNTER — Telehealth: Payer: Self-pay | Admitting: Oncology

## 2015-04-20 NOTE — Telephone Encounter (Signed)
Gave and printed appt sched and avs for pt for Aug °

## 2015-04-25 ENCOUNTER — Ambulatory Visit (HOSPITAL_BASED_OUTPATIENT_CLINIC_OR_DEPARTMENT_OTHER): Payer: Medicare Other | Admitting: Oncology

## 2015-04-25 ENCOUNTER — Other Ambulatory Visit (HOSPITAL_BASED_OUTPATIENT_CLINIC_OR_DEPARTMENT_OTHER): Payer: Medicare Other

## 2015-04-25 ENCOUNTER — Telehealth: Payer: Self-pay | Admitting: Oncology

## 2015-04-25 VITALS — BP 129/52 | HR 77 | Temp 98.1°F | Resp 18 | Ht 69.75 in | Wt 187.3 lb

## 2015-04-25 DIAGNOSIS — D473 Essential (hemorrhagic) thrombocythemia: Secondary | ICD-10-CM

## 2015-04-25 LAB — CBC WITH DIFFERENTIAL/PLATELET
BASO%: 0.7 % (ref 0.0–2.0)
Basophils Absolute: 0.1 10*3/uL (ref 0.0–0.1)
EOS ABS: 0.1 10*3/uL (ref 0.0–0.5)
EOS%: 0.7 % (ref 0.0–7.0)
HCT: 29.2 % — ABNORMAL LOW (ref 38.4–49.9)
HGB: 9.5 g/dL — ABNORMAL LOW (ref 13.0–17.1)
LYMPH%: 4.4 % — AB (ref 14.0–49.0)
MCH: 33.3 pg (ref 27.2–33.4)
MCHC: 32.5 g/dL (ref 32.0–36.0)
MCV: 102.5 fL — AB (ref 79.3–98.0)
MONO#: 1.1 10*3/uL — AB (ref 0.1–0.9)
MONO%: 8.2 % (ref 0.0–14.0)
NEUT%: 86 % — AB (ref 39.0–75.0)
NEUTROS ABS: 11 10*3/uL — AB (ref 1.5–6.5)
Platelets: 660 10*3/uL — ABNORMAL HIGH (ref 140–400)
RBC: 2.85 10*6/uL — AB (ref 4.20–5.82)
RDW: 25.6 % — AB (ref 11.0–14.6)
WBC: 12.9 10*3/uL — AB (ref 4.0–10.3)
lymph#: 0.6 10*3/uL — ABNORMAL LOW (ref 0.9–3.3)

## 2015-04-25 LAB — TECHNOLOGIST REVIEW

## 2015-04-25 NOTE — Progress Notes (Signed)
  Jack OFFICE PROGRESS NOTE   Diagnosis: Essential thrombocytosis  INTERVAL HISTORY:   Jack Schultz returns for a scheduled visit. He was seen in emergency room in March and June with epistaxis from the right nostril. He required packing on both occasions. No other bleeding. No symptom of thrombosis. He injured the right leg when getting out of the bed a few weeks ago. He felt the muscles "tear "at the posterior right thigh and knee. The discomfort is improving. He has chronic swelling of the right lower leg. This has not changed.  Objective:  Vital signs in last 24 hours:  Blood pressure 129/52, pulse 77, temperature 98.1 F (36.7 C), temperature source Oral, resp. rate 18, height 5' 9.75" (1.772 m), weight 187 lb 4.8 oz (84.959 kg), SpO2 98 %.    HEENT: No thrush or ulcer Resp: Lungs clear bilaterally Cardio: Regular rate and rhythm GI: No hepatosplenomegaly Vascular: The right lower leg is larger than the left side. No erythema or tenderness. No swelling of the right thigh. Musculoskeletal: Mild edema of the soft tissue surrounding the right knee     Lab Results:  Lab Results  Component Value Date   WBC 12.9* 04/25/2015   HGB 9.5* 04/25/2015   HCT 29.2* 04/25/2015   MCV 102.5* 04/25/2015   PLT 660* 04/25/2015   NEUTROABS 11.0* 04/25/2015    Medications: I have reviewed the patient's current medications.  Assessment/Plan: 1. Essential thrombocytosis. He continues hydroxyurea therapy. The platelet count is mildly elevated today 2. History of a macrocytic anemia. Most likely secondary to hydroxyurea therapy and the underlying myeloproliferative disorder. The hemoglobin is stable.  3. history of recurrent epistaxis- Related to the myeloproliferative disorder and aspirin therapy?      Disposition:  Jack Schultz will continue hydroxyurea at the current dose. The platelet count is mildly elevated today. He will return for an office visit and CBC  in 3 months.  I suspect the recurrent epistaxis is in part related to the myeloproliferative disorder and aspirin therapy.  He will seek medical attention if the right leg discomfort does not improve. I have a low clinical suspicion for a deep vein thrombosis.  Betsy Coder, MD  04/25/2015  10:18 AM

## 2015-04-25 NOTE — Telephone Encounter (Signed)
Pt confirmed labs/ov per 08/02 POF, gave pt avs and calendar.... KJ °

## 2015-05-19 ENCOUNTER — Other Ambulatory Visit: Payer: Self-pay | Admitting: Internal Medicine

## 2015-05-25 ENCOUNTER — Encounter: Payer: Self-pay | Admitting: Internal Medicine

## 2015-05-25 ENCOUNTER — Ambulatory Visit (INDEPENDENT_AMBULATORY_CARE_PROVIDER_SITE_OTHER): Payer: Medicare Other | Admitting: Internal Medicine

## 2015-05-25 VITALS — BP 120/56 | HR 80 | Temp 97.3°F | Resp 16 | Ht 69.75 in | Wt 173.2 lb

## 2015-05-25 DIAGNOSIS — Z79899 Other long term (current) drug therapy: Secondary | ICD-10-CM

## 2015-05-25 DIAGNOSIS — L03114 Cellulitis of left upper limb: Secondary | ICD-10-CM | POA: Diagnosis not present

## 2015-05-25 DIAGNOSIS — Z6825 Body mass index (BMI) 25.0-25.9, adult: Secondary | ICD-10-CM | POA: Diagnosis not present

## 2015-05-25 DIAGNOSIS — R7309 Other abnormal glucose: Secondary | ICD-10-CM

## 2015-05-25 DIAGNOSIS — R7303 Prediabetes: Secondary | ICD-10-CM

## 2015-05-25 LAB — URIC ACID: URIC ACID, SERUM: 6.4 mg/dL (ref 4.0–7.8)

## 2015-05-25 MED ORDER — CEPHALEXIN 500 MG PO CAPS: ORAL_CAPSULE | ORAL | Status: DC

## 2015-05-25 NOTE — Progress Notes (Signed)
   Subjective:    Patient ID: Jack Schultz, male    DOB: 1924/08/20, 81 y.o.   MRN: [id number]  HPI Patient is a nice 81 yo MWM with HTN, HLD, PreDM, HypoThyroid & Primary Thrombocythemia followed many years by Dr Benay Spice who presents today with a 1 week hx/o tender swelling if the Left wrist & hand. Denies injury or envenomation.   Medication Sig  . aspirin 81 MG tablet Take 81 mg by mouth daily.  Marland Kitchen atenolol (TENORMIN) 50 MG tablet TAKE 1 TABLET AT BEDTIME  . ferrous sulfate 325 (65 FE) MG tablet Take 325 mg by mouth daily with breakfast.  . furosemide (LASIX) 40 MG tablet TAKE 1 TABLET EVERY DAY (Patient taking differently: TAKE 20 MG BY MOUTH DAILY)  . hydroxyurea (HYDREA) 500 MG capsule TAKE ONE CAPSULE BY MOUTH EVERY DAY  . levothyroxine (SYNTHROID, LEVOTHROID) 50 MCG tablet TAKE 1 TABLET BY MOUTH EVERY DAY (Patient taking differently: TAKE 50 MCG BY MOUTH DAILY.)  . Cholecalciferol (VITAMIN D) 2000 UNITS tablet Take 4,000 Units by mouth daily.    Allergies  Allergen Reactions  . Ace Inhibitors Cough   Past Medical History  Diagnosis Date  . Hypertension   . Hyperlipidemia   . Pre-diabetes   . Thyroid disease   . Thrombocytosis   . GERD (gastroesophageal reflux disease)   . Vitamin D deficiency    Review of Systems 10 point systems review negative except as above.    Objective:   Physical Exam  BP 120/56 mmHg  Pulse 80  Temp(Src) 97.3 F (36.3 C)  Resp 16  Ht 5' 9.75" (1.772 m)  Wt 173 lb 3.2 oz (78.563 kg)  BMI 25.02 kg/m2  HEENT - Eac's patent. TM's Nl. EOM's full. PERRLA. NasoOroPharynx clear. Neck - supple. Nl Thyroid. Carotids 2+ & No bruits, nodes, JVD Chest - Clear equal BS w/o Rales, rhonchi, wheezes. Cor - Nl HS. RRR w/o sig MGR. PP 1(+). No edema. Abd - No palpable organomegaly, masses or tenderness. BS nl. MS- FROM w/o deformities. Muscle power, tone and bulk Nl. Gait Nl. Neuro - No obvious Cr N abnormalities. Sensory, motor and Cerebellar  functions appear Nl w/o focal abnormalities. Psyche - Mental status normal & appropriate.  No delusions, ideations or obvious mood abnormalities.  Skin - Warm tender and slightly erythematous STS of the left distal forearm, wrist and hand/fingers. No sign of rash, vesicles, ulcerations or lymphangitic streaking.     Assessment & Plan:   1. Cellulitis of hand, left  - cephALEXin (KEFLEX) 500 MG capsule; Take 1 capsule 4 x day with meals & bedtime for infection  Dispense: 56 capsule; Refill: 0  - CBC with Differential/Platelet  2. Medication management  - Uric acid - r/o gout  - - discussed meds/SE's - ROV - prn

## 2015-05-25 NOTE — Patient Instructions (Signed)

## 2015-05-26 LAB — CBC WITH DIFFERENTIAL/PLATELET
Basophils Absolute: 0 10*3/uL (ref 0.0–0.1)
Basophils Relative: 0 % (ref 0–1)
EOS ABS: 0 10*3/uL (ref 0.0–0.7)
EOS PCT: 0 % (ref 0–5)
HCT: 26.3 % — ABNORMAL LOW (ref 39.0–52.0)
Hemoglobin: 8.5 g/dL — ABNORMAL LOW (ref 13.0–17.0)
LYMPHS ABS: 0.9 10*3/uL (ref 0.7–4.0)
Lymphocytes Relative: 7 % — ABNORMAL LOW (ref 12–46)
MCH: 30.2 pg (ref 26.0–34.0)
MCHC: 32.3 g/dL (ref 30.0–36.0)
MCV: 93.6 fL (ref 78.0–100.0)
MONO ABS: 0.7 10*3/uL (ref 0.1–1.0)
MPV: 8.8 fL (ref 8.6–12.4)
Monocytes Relative: 5 % (ref 3–12)
Neutro Abs: 11.8 10*3/uL — ABNORMAL HIGH (ref 1.7–7.7)
Neutrophils Relative %: 88 % — ABNORMAL HIGH (ref 43–77)
Platelets: 493 10*3/uL — ABNORMAL HIGH (ref 150–400)
RBC: 2.81 MIL/uL — ABNORMAL LOW (ref 4.22–5.81)
RDW: 24.7 % — AB (ref 11.5–15.5)
WBC: 13.4 10*3/uL — ABNORMAL HIGH (ref 4.0–10.5)

## 2015-05-27 ENCOUNTER — Other Ambulatory Visit: Payer: Self-pay | Admitting: Internal Medicine

## 2015-05-30 ENCOUNTER — Telehealth: Payer: Self-pay | Admitting: *Deleted

## 2015-05-30 NOTE — Telephone Encounter (Signed)
Pt's son called earlier this AM around 11am stating pt did not feel well. Triage VM was down so f/u call was made later in day.  Son, Delfino Lovett, states pt is weak and can hardly walk; fell 2 weeks ago with no injury. Is on antibiotics for ?celluilits in hand or wrist. I explained that pt should be seen by primary if possible, and that I would need to speak with pt. Son states pt did not feel like talking but he would try to get pt in with pcp. Next appt is 9/12 with GAAM. I advised son if it is unsafe to take pt to MD appt, to call EMS if emergent care is needed. Son voiced understanding and will call us if any questions or concerns arise, or if there is trouble getting in sooner to see PCP; I advised pt's son to call us if this is the case.

## 2015-05-31 ENCOUNTER — Encounter (HOSPITAL_COMMUNITY): Payer: Self-pay | Admitting: *Deleted

## 2015-05-31 ENCOUNTER — Inpatient Hospital Stay (HOSPITAL_COMMUNITY): Payer: Medicare Other

## 2015-05-31 ENCOUNTER — Inpatient Hospital Stay (HOSPITAL_COMMUNITY)
Admission: EM | Admit: 2015-05-31 | Discharge: 2015-06-13 | DRG: 871 | Disposition: A | Discharge status: Skilled Nursing Facility | Payer: Medicare Other | Attending: Internal Medicine | Admitting: Internal Medicine

## 2015-05-31 ENCOUNTER — Emergency Department (HOSPITAL_COMMUNITY): Payer: Medicare Other

## 2015-05-31 ENCOUNTER — Ambulatory Visit: Payer: Self-pay | Admitting: Internal Medicine

## 2015-05-31 ENCOUNTER — Other Ambulatory Visit: Payer: Self-pay

## 2015-05-31 DIAGNOSIS — Z8249 Family history of ischemic heart disease and other diseases of the circulatory system: Secondary | ICD-10-CM

## 2015-05-31 DIAGNOSIS — Z87891 Personal history of nicotine dependence: Secondary | ICD-10-CM

## 2015-05-31 DIAGNOSIS — Z79899 Other long term (current) drug therapy: Secondary | ICD-10-CM | POA: Diagnosis not present

## 2015-05-31 DIAGNOSIS — B9689 Other specified bacterial agents as the cause of diseases classified elsewhere: Secondary | ICD-10-CM | POA: Diagnosis not present

## 2015-05-31 DIAGNOSIS — E559 Vitamin D deficiency, unspecified: Secondary | ICD-10-CM | POA: Diagnosis present

## 2015-05-31 DIAGNOSIS — I481 Persistent atrial fibrillation: Secondary | ICD-10-CM | POA: Diagnosis not present

## 2015-05-31 DIAGNOSIS — K219 Gastro-esophageal reflux disease without esophagitis: Secondary | ICD-10-CM | POA: Diagnosis present

## 2015-05-31 DIAGNOSIS — R531 Weakness: Secondary | ICD-10-CM

## 2015-05-31 DIAGNOSIS — J189 Pneumonia, unspecified organism: Secondary | ICD-10-CM | POA: Insufficient documentation

## 2015-05-31 DIAGNOSIS — I309 Acute pericarditis, unspecified: Secondary | ICD-10-CM | POA: Insufficient documentation

## 2015-05-31 DIAGNOSIS — D649 Anemia, unspecified: Secondary | ICD-10-CM | POA: Diagnosis present

## 2015-05-31 DIAGNOSIS — E875 Hyperkalemia: Secondary | ICD-10-CM | POA: Diagnosis present

## 2015-05-31 DIAGNOSIS — M25512 Pain in left shoulder: Secondary | ICD-10-CM | POA: Diagnosis present

## 2015-05-31 DIAGNOSIS — R7309 Other abnormal glucose: Secondary | ICD-10-CM | POA: Diagnosis present

## 2015-05-31 DIAGNOSIS — I319 Disease of pericardium, unspecified: Secondary | ICD-10-CM | POA: Diagnosis present

## 2015-05-31 DIAGNOSIS — I509 Heart failure, unspecified: Secondary | ICD-10-CM

## 2015-05-31 DIAGNOSIS — E039 Hypothyroidism, unspecified: Secondary | ICD-10-CM | POA: Diagnosis present

## 2015-05-31 DIAGNOSIS — E785 Hyperlipidemia, unspecified: Secondary | ICD-10-CM | POA: Diagnosis present

## 2015-05-31 DIAGNOSIS — E860 Dehydration: Secondary | ICD-10-CM | POA: Diagnosis present

## 2015-05-31 DIAGNOSIS — D509 Iron deficiency anemia, unspecified: Secondary | ICD-10-CM | POA: Diagnosis present

## 2015-05-31 DIAGNOSIS — J984 Other disorders of lung: Secondary | ICD-10-CM | POA: Diagnosis not present

## 2015-05-31 DIAGNOSIS — R140 Abdominal distension (gaseous): Secondary | ICD-10-CM | POA: Insufficient documentation

## 2015-05-31 DIAGNOSIS — E876 Hypokalemia: Secondary | ICD-10-CM | POA: Diagnosis present

## 2015-05-31 DIAGNOSIS — J90 Pleural effusion, not elsewhere classified: Secondary | ICD-10-CM | POA: Diagnosis not present

## 2015-05-31 DIAGNOSIS — D473 Essential (hemorrhagic) thrombocythemia: Secondary | ICD-10-CM | POA: Diagnosis present

## 2015-05-31 DIAGNOSIS — F05 Delirium due to known physiological condition: Secondary | ICD-10-CM | POA: Diagnosis not present

## 2015-05-31 DIAGNOSIS — A419 Sepsis, unspecified organism: Principal | ICD-10-CM | POA: Diagnosis present

## 2015-05-31 DIAGNOSIS — R410 Disorientation, unspecified: Secondary | ICD-10-CM | POA: Diagnosis not present

## 2015-05-31 DIAGNOSIS — D72829 Elevated white blood cell count, unspecified: Secondary | ICD-10-CM | POA: Diagnosis not present

## 2015-05-31 DIAGNOSIS — I4891 Unspecified atrial fibrillation: Secondary | ICD-10-CM | POA: Diagnosis not present

## 2015-05-31 DIAGNOSIS — R9431 Abnormal electrocardiogram [ECG] [EKG]: Secondary | ICD-10-CM | POA: Diagnosis not present

## 2015-05-31 DIAGNOSIS — M19032 Primary osteoarthritis, left wrist: Secondary | ICD-10-CM | POA: Diagnosis present

## 2015-05-31 DIAGNOSIS — I4892 Unspecified atrial flutter: Secondary | ICD-10-CM | POA: Diagnosis present

## 2015-05-31 DIAGNOSIS — Z7982 Long term (current) use of aspirin: Secondary | ICD-10-CM

## 2015-05-31 DIAGNOSIS — R296 Repeated falls: Secondary | ICD-10-CM | POA: Diagnosis present

## 2015-05-31 DIAGNOSIS — R21 Rash and other nonspecific skin eruption: Secondary | ICD-10-CM | POA: Diagnosis present

## 2015-05-31 DIAGNOSIS — J9691 Respiratory failure, unspecified with hypoxia: Secondary | ICD-10-CM | POA: Diagnosis present

## 2015-05-31 DIAGNOSIS — Y95 Nosocomial condition: Secondary | ICD-10-CM | POA: Diagnosis present

## 2015-05-31 DIAGNOSIS — D471 Chronic myeloproliferative disease: Secondary | ICD-10-CM | POA: Diagnosis present

## 2015-05-31 DIAGNOSIS — I442 Atrioventricular block, complete: Secondary | ICD-10-CM | POA: Diagnosis present

## 2015-05-31 DIAGNOSIS — E871 Hypo-osmolality and hyponatremia: Secondary | ICD-10-CM | POA: Diagnosis present

## 2015-05-31 DIAGNOSIS — J9811 Atelectasis: Secondary | ICD-10-CM | POA: Diagnosis present

## 2015-05-31 DIAGNOSIS — R52 Pain, unspecified: Secondary | ICD-10-CM

## 2015-05-31 DIAGNOSIS — N19 Unspecified kidney failure: Secondary | ICD-10-CM

## 2015-05-31 DIAGNOSIS — E440 Moderate protein-calorie malnutrition: Secondary | ICD-10-CM | POA: Diagnosis present

## 2015-05-31 DIAGNOSIS — L03114 Cellulitis of left upper limb: Secondary | ICD-10-CM | POA: Diagnosis present

## 2015-05-31 DIAGNOSIS — N179 Acute kidney failure, unspecified: Secondary | ICD-10-CM | POA: Diagnosis present

## 2015-05-31 DIAGNOSIS — R161 Splenomegaly, not elsewhere classified: Secondary | ICD-10-CM | POA: Diagnosis present

## 2015-05-31 DIAGNOSIS — IMO0001 Reserved for inherently not codable concepts without codable children: Secondary | ICD-10-CM | POA: Insufficient documentation

## 2015-05-31 DIAGNOSIS — G92 Toxic encephalopathy: Secondary | ICD-10-CM | POA: Diagnosis present

## 2015-05-31 DIAGNOSIS — E46 Unspecified protein-calorie malnutrition: Secondary | ICD-10-CM | POA: Diagnosis not present

## 2015-05-31 DIAGNOSIS — G8929 Other chronic pain: Secondary | ICD-10-CM | POA: Diagnosis present

## 2015-05-31 DIAGNOSIS — R509 Fever, unspecified: Secondary | ICD-10-CM | POA: Diagnosis present

## 2015-05-31 DIAGNOSIS — I9589 Other hypotension: Secondary | ICD-10-CM | POA: Diagnosis not present

## 2015-05-31 DIAGNOSIS — I10 Essential (primary) hypertension: Secondary | ICD-10-CM | POA: Diagnosis present

## 2015-05-31 DIAGNOSIS — M7989 Other specified soft tissue disorders: Secondary | ICD-10-CM | POA: Diagnosis not present

## 2015-05-31 DIAGNOSIS — R0600 Dyspnea, unspecified: Secondary | ICD-10-CM

## 2015-05-31 DIAGNOSIS — R0602 Shortness of breath: Secondary | ICD-10-CM

## 2015-05-31 LAB — CBC WITH DIFFERENTIAL/PLATELET
Band Neutrophils: 0 % (ref 0–10)
Basophils Absolute: 0 10*3/uL (ref 0.0–0.1)
Basophils Relative: 0 % (ref 0–1)
Blasts: 0 %
EOS PCT: 1 % (ref 0–5)
Eosinophils Absolute: 0.2 10*3/uL (ref 0.0–0.7)
HEMATOCRIT: 23.3 % — AB (ref 39.0–52.0)
Hemoglobin: 7.4 g/dL — ABNORMAL LOW (ref 13.0–17.0)
LYMPHS ABS: 0.7 10*3/uL (ref 0.7–4.0)
Lymphocytes Relative: 4 % — ABNORMAL LOW (ref 12–46)
MCH: 30.1 pg (ref 26.0–34.0)
MCHC: 31.8 g/dL (ref 30.0–36.0)
MCV: 94.7 fL (ref 78.0–100.0)
MONOS PCT: 5 % (ref 3–12)
Metamyelocytes Relative: 0 %
Monocytes Absolute: 0.9 10*3/uL (ref 0.1–1.0)
Myelocytes: 2 %
NEUTROS ABS: 15.7 10*3/uL — AB (ref 1.7–7.7)
NEUTROS PCT: 87 % — AB (ref 43–77)
NRBC: 0 /100{WBCs}
Other: 1 %
Platelets: ADEQUATE 10*3/uL (ref 150–400)
Promyelocytes Absolute: 0 %
RBC: 2.46 MIL/uL — AB (ref 4.22–5.81)
RDW: 25.2 % — AB (ref 11.5–15.5)
WBC: 17.6 10*3/uL — AB (ref 4.0–10.5)

## 2015-05-31 LAB — TSH: TSH: 0.841 u[IU]/mL (ref 0.350–4.500)

## 2015-05-31 LAB — MRSA PCR SCREENING: MRSA BY PCR: NEGATIVE

## 2015-05-31 LAB — CK: CK TOTAL: 50 U/L (ref 49–397)

## 2015-05-31 LAB — COMPREHENSIVE METABOLIC PANEL
ALT: 24 U/L (ref 17–63)
AST: 49 U/L — AB (ref 15–41)
Albumin: 2.9 g/dL — ABNORMAL LOW (ref 3.5–5.0)
Alkaline Phosphatase: 103 U/L (ref 38–126)
Anion gap: 10 (ref 5–15)
BUN: 42 mg/dL — AB (ref 6–20)
CHLORIDE: 97 mmol/L — AB (ref 101–111)
CO2: 22 mmol/L (ref 22–32)
Calcium: 8.6 mg/dL — ABNORMAL LOW (ref 8.9–10.3)
Creatinine, Ser: 1.48 mg/dL — ABNORMAL HIGH (ref 0.61–1.24)
GFR, EST AFRICAN AMERICAN: 47 mL/min — AB (ref >60)
GFR, EST NON AFRICAN AMERICAN: 40 mL/min — AB (ref >60)
Glucose, Bld: 119 mg/dL — ABNORMAL HIGH (ref 65–99)
POTASSIUM: 5.4 mmol/L — AB (ref 3.5–5.1)
SODIUM: 129 mmol/L — AB (ref 135–145)
Total Bilirubin: 1.9 mg/dL — ABNORMAL HIGH (ref 0.3–1.2)
Total Protein: 6.2 g/dL — ABNORMAL LOW (ref 6.5–8.1)

## 2015-05-31 LAB — URINALYSIS, ROUTINE W REFLEX MICROSCOPIC
Bilirubin Urine: NEGATIVE
GLUCOSE, UA: NEGATIVE mg/dL
HGB URINE DIPSTICK: NEGATIVE
Ketones, ur: NEGATIVE mg/dL
Leukocytes, UA: NEGATIVE
Nitrite: NEGATIVE
Protein, ur: NEGATIVE mg/dL
SPECIFIC GRAVITY, URINE: 1.018 (ref 1.005–1.030)
Urobilinogen, UA: 1 mg/dL (ref 0.0–1.0)
pH: 5 (ref 5.0–8.0)

## 2015-05-31 LAB — ABO/RH: ABO/RH(D): B NEG

## 2015-05-31 LAB — OCCULT BLOOD X 1 CARD TO LAB, STOOL: Fecal Occult Bld: NEGATIVE

## 2015-05-31 LAB — CBG MONITORING, ED: Glucose-Capillary: 159 mg/dL — ABNORMAL HIGH (ref 65–99)

## 2015-05-31 LAB — PREPARE RBC (CROSSMATCH)

## 2015-05-31 LAB — LIPASE, BLOOD: Lipase: 20 U/L — ABNORMAL LOW (ref 22–51)

## 2015-05-31 MED ORDER — SODIUM POLYSTYRENE SULFONATE 15 GM/60ML PO SUSP
30.0000 g | Freq: Once | ORAL | Status: AC
  Administered 2015-05-31: 30 g via ORAL
  Filled 2015-05-31: qty 120

## 2015-05-31 MED ORDER — SODIUM CHLORIDE 0.9 % IV SOLN
INTRAVENOUS | Status: DC
  Administered 2015-05-31 – 2015-06-02 (×3): via INTRAVENOUS

## 2015-05-31 MED ORDER — SODIUM CHLORIDE 0.9 % IV BOLUS (SEPSIS)
250.0000 mL | Freq: Once | INTRAVENOUS | Status: AC
  Administered 2015-05-31: 250 mL via INTRAVENOUS

## 2015-05-31 MED ORDER — ACETAMINOPHEN 325 MG PO TABS
650.0000 mg | ORAL_TABLET | Freq: Four times a day (QID) | ORAL | Status: DC | PRN
  Administered 2015-05-31 – 2015-06-12 (×16): 650 mg via ORAL
  Filled 2015-05-31 (×17): qty 2

## 2015-05-31 MED ORDER — SODIUM CHLORIDE 0.9 % IV BOLUS (SEPSIS)
500.0000 mL | Freq: Once | INTRAVENOUS | Status: AC
  Administered 2015-05-31: 500 mL via INTRAVENOUS

## 2015-05-31 MED ORDER — ONDANSETRON HCL 4 MG/2ML IJ SOLN
4.0000 mg | Freq: Four times a day (QID) | INTRAMUSCULAR | Status: DC | PRN
  Administered 2015-06-03 – 2015-06-07 (×2): 4 mg via INTRAVENOUS
  Filled 2015-05-31 (×2): qty 2

## 2015-05-31 MED ORDER — ONDANSETRON HCL 4 MG PO TABS: 4.0000 mg | ORAL_TABLET | Freq: Four times a day (QID) | ORAL | Status: DC | PRN

## 2015-05-31 MED ORDER — FERROUS SULFATE 325 (65 FE) MG PO TABS
325.0000 mg | ORAL_TABLET | Freq: Every day | ORAL | Status: DC
  Administered 2015-06-01 – 2015-06-13 (×13): 325 mg via ORAL
  Filled 2015-05-31 (×13): qty 1

## 2015-05-31 MED ORDER — HYDROXYUREA 500 MG PO CAPS: 500.0000 mg | ORAL_CAPSULE | Freq: Every day | ORAL | Status: DC

## 2015-05-31 MED ORDER — SODIUM CHLORIDE 0.9 % IV SOLN
Freq: Once | INTRAVENOUS | Status: AC
  Administered 2015-05-31: via INTRAVENOUS

## 2015-05-31 MED ORDER — LEVOTHYROXINE SODIUM 25 MCG PO TABS
50.0000 ug | ORAL_TABLET | Freq: Every day | ORAL | Status: DC
  Administered 2015-06-01 – 2015-06-13 (×13): 50 ug via ORAL
  Filled 2015-05-31: qty 2
  Filled 2015-05-31: qty 1
  Filled 2015-05-31 (×2): qty 2
  Filled 2015-05-31: qty 1
  Filled 2015-05-31 (×6): qty 2
  Filled 2015-05-31: qty 1
  Filled 2015-05-31 (×2): qty 2

## 2015-05-31 MED ORDER — VITAMIN D 1000 UNITS PO TABS
5000.0000 [IU] | ORAL_TABLET | Freq: Every day | ORAL | Status: DC
  Administered 2015-06-01 – 2015-06-13 (×13): 5000 [IU] via ORAL
  Filled 2015-05-31 (×13): qty 5

## 2015-05-31 MED ORDER — ACETAMINOPHEN 650 MG RE SUPP
650.0000 mg | Freq: Four times a day (QID) | RECTAL | Status: DC | PRN
  Administered 2015-06-06: 650 mg via RECTAL
  Filled 2015-05-31: qty 1

## 2015-05-31 NOTE — Progress Notes (Signed)
Pre-blood vitals taken. Patient axillary temp of 102.5. On call practitioner paged. Awaiting further orders. Luther Parody, RN

## 2015-05-31 NOTE — Progress Notes (Signed)
Received report from ED RN, Pt arrived unit, alert and oriented, able to communicate needs. MD aware of Pt's location. Will continue with current plan of care.

## 2015-05-31 NOTE — Progress Notes (Signed)
Pharmacy - Brief Note regarding hydroxyurea  Hydroxyurea (Hydrea) hold criteria:  ANC < 2  Pltc < 80K in sickle-cell patients; < 100K in other patients  Hgb <= 6 in sickle-cell patients; < 8 in other patients  Reticulocytes < 80K when Hgb < 9   CBC    Component Value Date/Time   WBC 17.6* 05/31/2015 1022   WBC 12.9* 04/25/2015 0902   RBC 2.46* 05/31/2015 1022   RBC 2.85* 04/25/2015 0902   HGB 7.4* 05/31/2015 1022   HGB 9.5* 04/25/2015 0902   HCT 23.3* 05/31/2015 1022   HCT 29.2* 04/25/2015 0902   PLT  05/31/2015 1022    PLATELET CLUMPS NOTED ON SMEAR, COUNT APPEARS ADEQUATE   PLT 660* 04/25/2015 0902   MCV 94.7 05/31/2015 1022   MCV 102.5* 04/25/2015 0902   MCH 30.1 05/31/2015 1022   MCH 33.3 04/25/2015 0902   MCHC 31.8 05/31/2015 1022   MCHC 32.5 04/25/2015 0902   RDW 25.2* 05/31/2015 1022   RDW 25.6* 04/25/2015 0902   LYMPHSABS 0.7 05/31/2015 1022   LYMPHSABS 0.6* 04/25/2015 0902   MONOABS 0.9 05/31/2015 1022   MONOABS 1.1* 04/25/2015 0902   EOSABS 0.2 05/31/2015 1022   EOSABS 0.1 04/25/2015 0902   BASOSABS 0.0 05/31/2015 1022   BASOSABS 0.1 04/25/2015 0902   Plan:  Per hold criteria, hydroxyurea discontinued at admission for Hgb < 8 for non sickle cell indication  Resume when labs appopriate  Doreene Eland, PharmD, BCPS.   Pager: [phone] 05/31/2015 7:06 PM

## 2015-05-31 NOTE — ED Notes (Signed)
Progressive weakness, predominately in LEs over last year which has gotten worse this week. Can "barely walk." Last week dx'd with cellulits in arm and is taking Keflex. Family wondering if medication is worsening weakness. Reports decreased appetite and upset stomach while taking Keflex. No nausea, vomiting or diarrhea.

## 2015-05-31 NOTE — ED Notes (Signed)
Pt given urinal and made aware of need for urine specimen 

## 2015-05-31 NOTE — ED Provider Notes (Addendum)
CSN: [id number]     Arrival date & time 05/31/15  0909 History   First MD Initiated Contact with Patient 05/31/15 0920     Chief Complaint  Patient presents with  . Weakness     (Consider location/radiation/quality/duration/timing/severity/associated sxs/prior Treatment) HPI..... Patient complains of weakness for the past 4 weeks, getting worse. He states he can no longer stand up and walk. Family reports that he looks pale. No fever, sweats, chills, chest pain, dyspnea, dysuria. He has been on Levaquin recently for cellulitis of his arms. He sees oncologist for essential thrombocytosis and macrocytic anemia. Severity of symptoms moderate to severe  Past Medical History  Diagnosis Date  . Hypertension   . Hyperlipidemia   . Pre-diabetes   . Thyroid disease   . Thrombocytosis   . GERD (gastroesophageal reflux disease)   . Vitamin D deficiency    History reviewed. No pertinent past surgical history. Family History  Problem Relation Age of Onset  . Hypertension Father   . Heart disease Father    Social History  Substance Use Topics  . Smoking status: Former Smoker    Quit date: 09/24/1975  . Smokeless tobacco: Current User  . Alcohol Use: No    Review of Systems  All other systems reviewed and are negative.     Allergies  Ace inhibitors  Home Medications   Prior to Admission medications   Medication Sig Start Date End Date Taking? Authorizing Provider  aspirin 81 MG tablet Take 81 mg by mouth daily.   Yes Historical Provider, MD  atenolol (TENORMIN) 50 MG tablet TAKE 1 TABLET AT BEDTIME Patient taking differently: TAKE 50 MG BY MOUTH AT BEDTIME 05/19/15  Yes Unk Pinto, MD  cephALEXin (KEFLEX) 500 MG capsule Take 1 capsule 4 x day with meals & bedtime for infection Patient taking differently: Take 500 mg by mouth 4 (four) times daily.  05/25/15 06/24/15 Yes Unk Pinto, MD  Cholecalciferol (VITAMIN D PO) Take 5,000 Units by mouth daily.   Yes Historical  Provider, MD  ferrous sulfate 325 (65 FE) MG tablet Take 325 mg by mouth daily with breakfast.   Yes Historical Provider, MD  furosemide (LASIX) 40 MG tablet TAKE 1 TABLET EVERY DAY Patient taking differently: TAKE 40 MG BY MOUTH EVERY DAY 05/27/15  Yes Unk Pinto, MD  hydroxyurea (HYDREA) 500 MG capsule TAKE ONE CAPSULE BY MOUTH EVERY DAY Patient taking differently: TAKE 500 MG BY MOUTH EVERY DAY 04/20/15  Yes Ladell Pier, MD  levothyroxine (SYNTHROID, LEVOTHROID) 50 MCG tablet TAKE 1 TABLET BY MOUTH EVERY DAY Patient taking differently: TAKE 50 MCG BY MOUTH DAILY. 02/06/15  Yes Vicie Mutters, PA-C   BP 109/45 mmHg  Pulse 62  Resp 18  SpO2 95% Physical Exam  Constitutional: He is oriented to person, place, and time.  Pleasant, pale  HENT:  Head: Normocephalic and atraumatic.  Eyes: Conjunctivae and EOM are normal. Pupils are equal, round, and reactive to light.  Neck: Normal range of motion. Neck supple.  Cardiovascular: Normal rate and regular rhythm.   Pulmonary/Chest: Effort normal and breath sounds normal.  Abdominal: Soft. Bowel sounds are normal.  Musculoskeletal: Normal range of motion.  Neurological: He is alert and oriented to person, place, and time.  Skin: Skin is warm and dry.  Psychiatric: He has a normal mood and affect. His behavior is normal.  Nursing note and vitals reviewed.   ED Course  Procedures (including critical care time) Labs Review Labs Reviewed  COMPREHENSIVE METABOLIC PANEL -  Abnormal; Notable for the following:    Sodium 129 (*)    Potassium 5.4 (*)    Chloride 97 (*)    Glucose, Bld 119 (*)    BUN 42 (*)    Creatinine, Ser 1.48 (*)    Calcium 8.6 (*)    Total Protein 6.2 (*)    Albumin 2.9 (*)    AST 49 (*)    Total Bilirubin 1.9 (*)    GFR calc non Af Amer 40 (*)    GFR calc Af Amer 47 (*)    All other components within normal limits  CBC WITH DIFFERENTIAL/PLATELET - Abnormal; Notable for the following:    WBC 17.6 (*)    RBC  2.46 (*)    Hemoglobin 7.4 (*)    HCT 23.3 (*)    RDW 25.2 (*)    Neutrophils Relative % 87 (*)    Lymphocytes Relative 4 (*)    Neutro Abs 15.7 (*)    All other components within normal limits  LIPASE, BLOOD - Abnormal; Notable for the following:    Lipase 20 (*)    All other components within normal limits  URINALYSIS, ROUTINE W REFLEX MICROSCOPIC (NOT AT [facility]) - Abnormal; Notable for the following:    Color, Urine AMBER (*)    All other components within normal limits  CBG MONITORING, ED - Abnormal; Notable for the following:    Glucose-Capillary 159 (*)    All other components within normal limits  POC OCCULT BLOOD, ED    Imaging Review Dg Chest 2 View  05/31/2015   CLINICAL DATA:  Progressive, predominantly lower extremity weakness over the past year, worse in the last week. Fevers and decreased appetite.  EXAM: CHEST  2 VIEW  COMPARISON:  None.  FINDINGS: The cardiac silhouette is upper limits of normal in size. Thoracic aortic calcification is noted. There is slight coarsening of the interstitial markings bilaterally. No confluent airspace consolidation, edema, pleural effusion, or pneumothorax is identified. Curvilinear lucency projecting over the lateral left lung base likely represents a skin fold. Thoracic spondylosis is noted.  IMPRESSION: No active cardiopulmonary disease.   Electronically Signed   By: Logan Bores M.D.   On: 05/31/2015 10:38   I have personally reviewed and evaluated these images and lab results as part of my medical decision-making.   EKG Interpretation None      MDM   Final diagnoses:  Weakness  Anemia, unspecified anemia type   Patient is weak. Hemoglobin has dropped to 7.4.  Rectal exam pending. Admit to general medicine.    Nat Christen, MD 05/31/15 [place], MD 06/01/15 [phone]

## 2015-05-31 NOTE — Progress Notes (Addendum)
EKG concerning for complete heart block. Discussed with cardiology Dr Tamala Julian reviewed the EKG and thinks this is AV rhythm dissociation. Patient currently asymptomatic. BP 116/47, HR 70. Will place him on telemetry and  transfer to stepdown unit. Continue to hold beta blocker. Ordered bedside pacer pads. Cardiology will see pt.

## 2015-05-31 NOTE — Progress Notes (Signed)
New order to transfer Pt to ICU bed. Report called to Ebony Hail, Therapist, sports. Pt transferred in stable condition.

## 2015-05-31 NOTE — ED Notes (Signed)
Occult card at bedside 

## 2015-05-31 NOTE — ED Notes (Signed)
Pt attempted to provide urine spexcimen but was unsuccessful at this time

## 2015-05-31 NOTE — Progress Notes (Signed)
Patient given 279ml NS fluid bolus and 650mg  acetaminophen for increased temperature. On call NP ordered to proceed with administration of blood products as ordered. Will continue to monitor. Luther Parody, RN

## 2015-05-31 NOTE — H&P (Addendum)
[facility] History and Physical  AYODELE SANGALANG WNU:[id number] DOB: [birth year] DOA: 05/31/2015  Referring physician: Dr Lacinda Axon PCP: Unk Pinto DAVID, MD   Chief Complaint:  Generalized weakness and frequent falls  HPI:  81 year old male with history of hypertension, hyperlipidemia, thrombocytosis (on hydroxyurea, sees Dr. [name]), recurrent epistaxis for to be related to myeloproliferative disorder and aspirin therapy hypothyroidism, iron deficiency anemia and GERD brought to the ED by family as patient has been progressively getting weak for the past 4 weeks with poor by mouth intake and recurrent falls. Patient reports having frequent falls without any loss of consciousness or sustaining any injury. He was seen by his PCP on 9/1  days ago and was found to have cellulitis of his distal left forearm and prescribed a 2 weeks course of Keflex. He reports that since then he has been having pain in his left arm and shoulder. Family also reports patient having off-and-on diffuse abdominal pain past 1 week. No associated nausea and vomiting or bowel symptoms. Patient denies headache, dizziness, fever, chills, nausea , vomiting, chest pain, palpitations, SOB, abdominal pain, bowel or urinary symptoms. Denies change in weight but has poor appetite for last 3 weeks.  Course in the ED Patient blood pressure was low normal at 94/48 mmHg O2 sat of 89% on room air. Blood will done showed significant leukocytosis with WBC of 17.6, hemoglobin of 7.4 (baseline hemoglobin of 9-10), clumped platelets. Chemistry showed sodium of 129, potassium 5.4, chloride of 97, BUN of 42 and creatinine 1.48. Glucose 119. Chest x-ray and UA were unremarkable. Hospitalist admission requested for further management.  Review of Systems:  Constitutional: Denies fever, chills, diaphoresis, appetite change and fatigue.  HEENT: Denies visual or hearing symptoms, difficulty swallowing, neck pain or stiffness Respiratory:  Denies SOB, DOE, cough, chest tightness,  and wheezing.   Cardiovascular: Denies chest pain, palpitations and leg swelling.  Gastrointestinal: Diffuse abdominal pain, Denies nausea, vomiting,diarrhea, constipation, blood in stool and abdominal distention.  Genitourinary: Denies dysuria, urgency, frequency, hematuria, flank pain and difficulty urinating.  Endocrine: Denies: hot or cold intolerance, , polyuria, polydipsia. Musculoskeletal: left arm and shoulder pain, Denies  back pain, joint swelling, arthralgias and gait problem.  Skin: Denies pallor, rash and wound.  Neurological: Weakness, frequent falls, Denies dizziness, seizures, syncope, light-headedness, numbness and headaches.  Hematological: Denies adenopathy. Psychiatric/Behavioral: Denies confusion  Past Medical History  Diagnosis Date  . Hypertension   . Hyperlipidemia   . Pre-diabetes   . Thyroid disease   . Thrombocytosis   . GERD (gastroesophageal reflux disease)   . Vitamin D deficiency    History reviewed. No pertinent past surgical history. Social History:  reports that he quit smoking about 39 years ago. He uses smokeless tobacco. He reports that he does not drink alcohol or use illicit drugs.  Allergies  Allergen Reactions  . Ace Inhibitors Cough    Family History  Problem Relation Age of Onset  . Hypertension Father   . Heart disease Father     Prior to Admission medications   Medication Sig Start Date End Date Taking? Authorizing Provider  aspirin 81 MG tablet Take 81 mg by mouth daily.   Yes Historical Provider, MD  atenolol (TENORMIN) 50 MG tablet TAKE 1 TABLET AT BEDTIME Patient taking differently: TAKE 50 MG BY MOUTH AT BEDTIME 05/19/15  Yes Unk Pinto, MD  cephALEXin (KEFLEX) 500 MG capsule Take 1 capsule 4 x day with meals & bedtime for infection Patient taking differently: Take 500 mg by mouth  4 (four) times daily.  05/25/15 06/24/15 Yes Unk Pinto, MD  Cholecalciferol (VITAMIN D PO) Take  5,000 Units by mouth daily.   Yes Historical Provider, MD  ferrous sulfate 325 (65 FE) MG tablet Take 325 mg by mouth daily with breakfast.   Yes Historical Provider, MD  furosemide (LASIX) 40 MG tablet TAKE 1 TABLET EVERY DAY Patient taking differently: TAKE 40 MG BY MOUTH EVERY DAY 05/27/15  Yes Unk Pinto, MD  hydroxyurea (HYDREA) 500 MG capsule TAKE ONE CAPSULE BY MOUTH EVERY DAY Patient taking differently: TAKE 500 MG BY MOUTH EVERY DAY 04/20/15  Yes Ladell Pier, MD  levothyroxine (SYNTHROID, LEVOTHROID) 50 MCG tablet TAKE 1 TABLET BY MOUTH EVERY DAY Patient taking differently: TAKE 50 MCG BY MOUTH DAILY. 02/06/15  Yes Vicie Mutters, PA-C     Physical Exam:  Filed Vitals:   05/31/15 1045 05/31/15 1229 05/31/15 1232 05/31/15 1446  BP:  118/48 109/45 94/48  Pulse: 62 63 62 66  Resp: 22 20 18 23   SpO2: 89% 93% 95% 96%    Constitutional: Vital signs reviewed.  Remain in no acute distress HEENT: Pallor present, no icterus, moist oral mucosa, no cervical lymphadenopathy Cardiovascular: RRR, S1 normal, S2 normal, no MRG Chest: CTAB, no wheezes, rales, or rhonchi Abdominal: Soft. Mild distention, nontender, bowel sounds present, no mass, no guarding or rigidity Ext: warm, no edema, limited mobility of the left arm due to pain. Tenderness over left arm and shoulder  Neurological: A&O x3, non focal  Labs on Admission:  Basic Metabolic Panel:  Recent Labs Lab 05/31/15 1022  NA 129*  K 5.4*  CL 97*  CO2 22  GLUCOSE 119*  BUN 42*  CREATININE 1.48*  CALCIUM 8.6*   Liver Function Tests:  Recent Labs Lab 05/31/15 1022  AST 49*  ALT 24  ALKPHOS 103  BILITOT 1.9*  PROT 6.2*  ALBUMIN 2.9*    Recent Labs Lab 05/31/15 1022  LIPASE 20*   No results for input(s): AMMONIA in the last 168 hours. CBC:  Recent Labs Lab 05/25/15 1526 05/31/15 1022  WBC 13.4* 17.6*  NEUTROABS 11.8* 15.7*  HGB 8.5* 7.4*  HCT 26.3* 23.3*  MCV 93.6 94.7  PLT 493* PLATELET CLUMPS  NOTED ON SMEAR, COUNT APPEARS ADEQUATE   Cardiac Enzymes: No results for input(s): CKTOTAL, CKMB, CKMBINDEX, TROPONINI in the last 168 hours. BNP: Invalid input(s): POCBNP CBG:  Recent Labs Lab 05/31/15 0923  GLUCAP 159*    Radiological Exams on Admission: Dg Chest 2 View  05/31/2015   CLINICAL DATA:  Progressive, predominantly lower extremity weakness over the past year, worse in the last week. Fevers and decreased appetite.  EXAM: CHEST  2 VIEW  COMPARISON:  None.  FINDINGS: The cardiac silhouette is upper limits of normal in size. Thoracic aortic calcification is noted. There is slight coarsening of the interstitial markings bilaterally. No confluent airspace consolidation, edema, pleural effusion, or pneumothorax is identified. Curvilinear lucency projecting over the lateral left lung base likely represents a skin fold. Thoracic spondylosis is noted.  IMPRESSION: No active cardiopulmonary disease.   Electronically Signed   By: Logan Bores M.D.   On: 05/31/2015 10:38    EKG: pending  Assessment/Plan Symptomatic anemia Admit to medical floor. Will transfuse with 2 units PRBC. Check stool for occult blood. Hold home blood pressure medications . Place on IV hydration with normal saline.   Generalized weakness with frequent fall Possibly associated with anemia and dehydration. Check orthostasis, a.m. cortisol, TSH and B12  level. Head CT to r/o any injury given multiple falls and weakness.  PT evaluation in a.m.  Hyponatremia/hyperkalemia Gentle IV hydration with normal saline.Ordered kayexalate. Check EKG  Acute kidney injury Secondary to dehydration. Monitor with IV fluids.  Essential thrombocytosis Has associated frequent epistaxis with myeloproliferative disorder and aspirin use.  Leukocytosis No clear source of infection. No signs of cellulitis over left arm. Has taken 7 days of oral Keflex already. Will check blood culture. Chest x-ray and UA unremarkable.  monitor off  antibiotics for now.  Abdominal pain Has mild distention but is nontender. Check abdominal ultrasound.  Left arm and shoulder pain Denies any injury sustained with falls. Tender to pressure over her left arm and shoulder. Check x-ray of the left shoulder and humerus. Check CPK. Uric acid checked by PCP recently was normal.  Hyportension Hold blood pressure medications.  Hypothyroidism Continue Synthroid. Check TSH  Prediabetes   Diet:regular  DVT prophylaxis:SCD   Code Status: full code Family Communication: discussed with son and daughter in law  at bedside Disposition Plan: admit to med surg  Louellen Molder [facility] Pager [phone]  Total time spent on admission  60 minutes  If 7PM-7AM, please contact night-coverage [email or web address] Password [email or web address] 05/31/2015, 3:00 PM

## 2015-06-01 ENCOUNTER — Inpatient Hospital Stay (HOSPITAL_COMMUNITY): Payer: Medicare Other

## 2015-06-01 DIAGNOSIS — N179 Acute kidney failure, unspecified: Secondary | ICD-10-CM

## 2015-06-01 DIAGNOSIS — I442 Atrioventricular block, complete: Secondary | ICD-10-CM | POA: Diagnosis present

## 2015-06-01 DIAGNOSIS — R509 Fever, unspecified: Secondary | ICD-10-CM | POA: Diagnosis present

## 2015-06-01 DIAGNOSIS — D500 Iron deficiency anemia secondary to blood loss (chronic): Secondary | ICD-10-CM

## 2015-06-01 DIAGNOSIS — I9589 Other hypotension: Secondary | ICD-10-CM

## 2015-06-01 DIAGNOSIS — M25512 Pain in left shoulder: Secondary | ICD-10-CM | POA: Diagnosis present

## 2015-06-01 DIAGNOSIS — D649 Anemia, unspecified: Secondary | ICD-10-CM

## 2015-06-01 DIAGNOSIS — L03114 Cellulitis of left upper limb: Secondary | ICD-10-CM | POA: Diagnosis present

## 2015-06-01 DIAGNOSIS — R9431 Abnormal electrocardiogram [ECG] [EKG]: Secondary | ICD-10-CM

## 2015-06-01 DIAGNOSIS — D473 Essential (hemorrhagic) thrombocythemia: Secondary | ICD-10-CM

## 2015-06-01 DIAGNOSIS — D471 Chronic myeloproliferative disease: Secondary | ICD-10-CM

## 2015-06-01 LAB — BASIC METABOLIC PANEL
ANION GAP: 10 (ref 5–15)
BUN: 44 mg/dL — AB (ref 6–20)
CALCIUM: 8 mg/dL — AB (ref 8.9–10.3)
CO2: 23 mmol/L (ref 22–32)
Chloride: 97 mmol/L — ABNORMAL LOW (ref 101–111)
Creatinine, Ser: 1.42 mg/dL — ABNORMAL HIGH (ref 0.61–1.24)
GFR calc Af Amer: 49 mL/min — ABNORMAL LOW (ref >60)
GFR, EST NON AFRICAN AMERICAN: 42 mL/min — AB (ref >60)
Glucose, Bld: 134 mg/dL — ABNORMAL HIGH (ref 65–99)
POTASSIUM: 3.8 mmol/L (ref 3.5–5.1)
SODIUM: 130 mmol/L — AB (ref 135–145)

## 2015-06-01 LAB — CBC
HEMATOCRIT: 22.7 % — AB (ref 39.0–52.0)
HEMOGLOBIN: 7.2 g/dL — AB (ref 13.0–17.0)
MCH: 30 pg (ref 26.0–34.0)
MCHC: 31.7 g/dL (ref 30.0–36.0)
MCV: 94.6 fL (ref 78.0–100.0)
Platelets: 390 10*3/uL (ref 150–400)
RBC: 2.4 MIL/uL — ABNORMAL LOW (ref 4.22–5.81)
RDW: 23.3 % — AB (ref 11.5–15.5)
WBC: 16.8 10*3/uL — AB (ref 4.0–10.5)

## 2015-06-01 LAB — PROTIME-INR
INR: 1.61 — ABNORMAL HIGH (ref 0.00–1.49)
PROTHROMBIN TIME: 19.2 s — AB (ref 11.6–15.2)

## 2015-06-01 LAB — GLUCOSE, CAPILLARY: GLUCOSE-CAPILLARY: 170 mg/dL — AB (ref 65–99)

## 2015-06-01 LAB — HEMOGLOBIN AND HEMATOCRIT, BLOOD
HEMATOCRIT: 24.8 % — AB (ref 39.0–52.0)
HEMOGLOBIN: 7.9 g/dL — AB (ref 13.0–17.0)

## 2015-06-01 LAB — VITAMIN B12: Vitamin B-12: 975 pg/mL — ABNORMAL HIGH (ref 180–914)

## 2015-06-01 LAB — PROCALCITONIN: PROCALCITONIN: 0.75 ng/mL

## 2015-06-01 LAB — SEDIMENTATION RATE: SED RATE: 106 mm/h — AB (ref 0–16)

## 2015-06-01 LAB — CORTISOL: CORTISOL PLASMA: 30.4 ug/dL

## 2015-06-01 LAB — APTT: aPTT: 44 seconds — ABNORMAL HIGH (ref 24–37)

## 2015-06-01 LAB — LACTIC ACID, PLASMA: LACTIC ACID, VENOUS: 1.6 mmol/L (ref 0.5–2.0)

## 2015-06-01 LAB — C-REACTIVE PROTEIN: CRP: 20.7 mg/dL — AB (ref <1.0)

## 2015-06-01 LAB — URIC ACID: URIC ACID, SERUM: 5.7 mg/dL (ref 4.4–7.6)

## 2015-06-01 MED ORDER — PIPERACILLIN-TAZOBACTAM 3.375 G IVPB 30 MIN
3.3750 g | INTRAVENOUS | Status: AC
  Administered 2015-06-01: 3.375 g via INTRAVENOUS
  Filled 2015-06-01: qty 50

## 2015-06-01 MED ORDER — VANCOMYCIN HCL IN DEXTROSE 1-5 GM/200ML-% IV SOLN
1000.0000 mg | Freq: Every day | INTRAVENOUS | Status: DC
  Administered 2015-06-01 – 2015-06-05 (×5): 1000 mg via INTRAVENOUS
  Filled 2015-06-01 (×3): qty 200

## 2015-06-01 MED ORDER — VANCOMYCIN HCL IN DEXTROSE 1-5 GM/200ML-% IV SOLN: 1000.0000 mg | INTRAVENOUS | Status: DC

## 2015-06-01 MED ORDER — DEXTROSE 5 % IV SOLN
1.0000 g | INTRAVENOUS | Status: DC
  Administered 2015-06-01 – 2015-06-05 (×5): 1 g via INTRAVENOUS
  Filled 2015-06-01 (×5): qty 10

## 2015-06-01 MED ORDER — SODIUM CHLORIDE 0.9 % IV BOLUS (SEPSIS)
1000.0000 mL | Freq: Once | INTRAVENOUS | Status: AC
  Administered 2015-06-01: 1000 mL via INTRAVENOUS

## 2015-06-01 MED ORDER — PIPERACILLIN-TAZOBACTAM 3.375 G IVPB
3.3750 g | Freq: Three times a day (TID) | INTRAVENOUS | Status: DC
  Administered 2015-06-01: 3.375 g via INTRAVENOUS
  Filled 2015-06-01: qty 50

## 2015-06-01 MED ORDER — PIPERACILLIN-TAZOBACTAM 3.375 G IVPB 30 MIN: 3.3750 g | Freq: Once | INTRAVENOUS | Status: DC

## 2015-06-01 NOTE — Consult Note (Signed)
[facility] for Infectious Disease    Date of Admission:  05/31/2015    Total days of antibiotics 8        Day 2 vancomycin        Day 2 piperacillin tazobactam              Reason for Consult: Fever and recent left arm cellulitis    Referring Physician: Dr. Louellen Molder Primary Care Physician: Dr. Unk Pinto  Principal Problem:   Fever Active Problems:   Symptomatic anemia   Acute kidney injury   Frequent falls   Complete heart block   Acute pain of left shoulder   Cellulitis of arm, left   Essential thrombocytosis   Essential hypertension   Vitamin D deficiency   Hypothyroidism   Hyperkalemia   Hyponatremia   Leucocytosis   . cholecalciferol  5,000 Units Oral Daily  . ferrous sulfate  325 mg Oral Daily  . levothyroxine  50 mcg Oral QAC breakfast  . piperacillin-tazobactam (ZOSYN)  IV  3.375 g Intravenous Q8H  . vancomycin  1,000 mg Intravenous Daily    Recommendations: 1. Continue vancomycin  2. Narrow piperacillin tazobactam to ceftriaxone pending final blood cultures 3. Await results of echocardiogram  Assessment: Certainly concerned about the possibility of smoldering systemic infection given his several month-long illness and at least 2 weeks of subjective fevers. His blood cultures are negative but could be falsely negative because of recent cephalexin therapy. I will continue vancomycin and switch piperacillin tazobactam to ceftriaxone pending further observation and final blood culture results. Given that he has developed acute heart block probably need to have the results of the echocardiogram to make sure that he does not have endocarditis and a perivalvular abscess.    HPI: Jack Schultz is a 81 y.o. male with a myeloproliferative disorder and essential thrombocytosis. He had considered himself healthy and was fairly active until several months ago when he began to develop generalized weakness and recurrent falls. He had a fall  about one month ago and was so weak that his family had difficulty getting him up and back to bed. He has had a poor appetite and has been losing weight. His daughter-in-law estimates that he's lost about 10 pounds in the last month. He believes he started having fever about 2 weeks ago. He then developed acute pain, redness and swelling of his left hand and forearm. He was seen by his primary care physician on 05/25/2015 and was prescribed cephalexin. The left arm cellulitis improved but he continued to have fever and got weaker. He is also noticed acute on chronic left shoulder pain. He does not believe that he injured his left arm or shoulder in any of his recent falls.  He was admitted yesterday and had a temperature of 102.5, acute renal insufficiency, acute on chronic anemia and complete heart block. He was started on empiric vancomycin and piperacillin tazobactam. Blood cultures are negative to date. No acute abnormality was noted on chest x-ray or abdominal ultrasound. X-rays of his left wrist and shoulder reveal degenerative arthritis. He states that he is feeling better today.  Review of Systems: Constitutional: positive for anorexia, fatigue, fevers and weight loss, negative for chills and sweats Eyes: negative Ears, nose, mouth, throat, and face: positive for epistaxis Respiratory: positive for dyspnea on exertion, negative for cough, pleurisy/chest pain and sputum Cardiovascular: negative Gastrointestinal: positive for abdominal pain, nausea and mild diarrhea while he was taking  cephalexin, negative for vomiting Genitourinary:negative Integument/breast: As noted in history of present illness Musculoskeletal:acute on chronic left shoulder pain  Past Medical History  Diagnosis Date  . Hypertension   . Hyperlipidemia   . Pre-diabetes   . Thyroid disease   . Thrombocytosis   . GERD (gastroesophageal reflux disease)   . Vitamin D deficiency     Social History  Substance Use Topics    . Smoking status: Former Smoker    Quit date: 09/24/1975  . Smokeless tobacco: Current User  . Alcohol Use: No    Family History  Problem Relation Age of Onset  . Hypertension Father   . Heart disease Father    Allergies  Allergen Reactions  . Ace Inhibitors Cough    OBJECTIVE: Blood pressure 94/34, pulse 62, temperature 98.1 F (36.7 C), temperature source Oral, resp. rate 16, height 5\' 9"  (1.753 m), weight 171 lb 1.2 oz (77.6 kg), SpO2 96 %. General: He is alert and in no distress present in with his wife and daughter-in-law. He is resting comfortably in bed Skin: His left hand and arm cellulitis has resolved. No splinter hemorrhages noted Eyes: Pinpoint pupils. Normal external exam without conjunctival hemorrhages Oral: Teeth are in good condition. No oropharyngeal lesions Lungs: Clear Cor: Irregular S1 and S2 with no murmur heard Abdomen: Soft and nontender without palpable masses Joints and extremities: Pain with range of motion of left shoulder. No redness, warmth or swelling is noted  Lab Results Lab Results  Component Value Date   WBC 16.8* 06/01/2015   HGB 7.2* 06/01/2015   HCT 22.7* 06/01/2015   MCV 94.6 06/01/2015   PLT 390 06/01/2015    Lab Results  Component Value Date   CREATININE 1.42* 06/01/2015   BUN 44* 06/01/2015   NA 130* 06/01/2015   K 3.8 06/01/2015   CL 97* 06/01/2015   CO2 23 06/01/2015    Lab Results  Component Value Date   ALT 24 05/31/2015   AST 49* 05/31/2015   ALKPHOS 103 05/31/2015   BILITOT 1.9* 05/31/2015     Microbiology: Recent Results (from the past 240 hour(s))  Culture, blood (routine x 2)     Status: None (Preliminary result)   Collection Time: 05/31/15  3:40 PM  Result Value Ref Range Status   Specimen Description BLOOD LEFT ANTECUBITAL  Final   Special Requests BOTTLES DRAWN AEROBIC AND ANAEROBIC 5CC  Final   Culture   Final    NO GROWTH < 24 HOURS Performed at [facility]    Report Status PENDING   Incomplete  Culture, blood (routine x 2)     Status: None (Preliminary result)   Collection Time: 05/31/15  3:45 PM  Result Value Ref Range Status   Specimen Description BLOOD BLOOD RIGHT HAND  Final   Special Requests BOTTLES DRAWN AEROBIC AND ANAEROBIC 5CC  Final   Culture   Final    NO GROWTH < 24 HOURS Performed at [facility]    Report Status PENDING  Incomplete  MRSA PCR Screening     Status: None   Collection Time: 05/31/15  6:55 PM  Result Value Ref Range Status   MRSA by PCR NEGATIVE NEGATIVE Final    Comment:        The GeneXpert MRSA Assay (FDA approved for NASAL specimens only), is one component of a comprehensive MRSA colonization surveillance program. It is not intended to diagnose MRSA infection nor to guide or monitor treatment for MRSA infections.  Michel Bickers, MD [facility] for [facility] [phone] pager   [phone] cell 06/01/2015, 4:20 PM

## 2015-06-01 NOTE — Progress Notes (Signed)
At 1126 pt bp was 87/35 and diophretic.  Pt denies any cp and in no resp distress.  CBG checked and was 170.  Informed Dr. Clementeen Graham and 1,079mL bolus of NS ordered.  Irven Baltimore, RN

## 2015-06-01 NOTE — Progress Notes (Signed)
Pt hgb 7.9.  Paged Dr Raeanne Barry to inform per order but did not get a response.  Irven Baltimore, RN

## 2015-06-01 NOTE — Progress Notes (Signed)
PT Cancellation Note  Patient Details Name: Jack Schultz MRN: [id number] DOB: [birth year]   Cancelled Treatment:    Reason Eval/Treat Not Completed: Medical issues which prohibited therapy (noted low BP. will check back tomorrow.)   [name] 06/01/2015, 12:34 PM Tresa Endo PT [phone]

## 2015-06-01 NOTE — Progress Notes (Signed)
ANTIBIOTIC CONSULT NOTE - INITIAL  Pharmacy Consult for Vancomycin, Zosyn Indication: rule out sepsis  Allergies  Allergen Reactions  . Ace Inhibitors Cough    Patient Measurements: Height: 5\' 9"  (175.3 cm) Weight: 171 lb 1.2 oz (77.6 kg) IBW/kg (Calculated) : 70.7  Vital Signs: Temp: 99.4 F (37.4 C) (09/08 0509) Temp Source: Axillary (09/08 0509) BP: 116/44 mmHg (09/08 0600) Pulse Rate: 70 (09/08 0600) Intake/Output from previous day: 09/07 0701 - 09/08 0700 In: 1938.8 [I.V.:1543.8; Blood:395] Out: 225 [Urine:225]  Labs:  Recent Labs  05/31/15 1022 06/01/15 0347  WBC 17.6* 16.8*  HGB 7.4* 7.2*  PLT PLATELET CLUMPS NOTED ON SMEAR, COUNT APPEARS ADEQUATE 390  CREATININE 1.48* 1.42*   Estimated Creatinine Clearance: 35.3 mL/min (by C-G formula based on Cr of 1.42). No results for input(s): VANCOTROUGH, VANCOPEAK, VANCORANDOM, GENTTROUGH, GENTPEAK, GENTRANDOM, TOBRATROUGH, TOBRAPEAK, TOBRARND, AMIKACINPEAK, AMIKACINTROU, AMIKACIN in the last 72 hours.   Microbiology: Recent Results (from the past 720 hour(s))  MRSA PCR Screening     Status: None   Collection Time: 05/31/15  6:55 PM  Result Value Ref Range Status   MRSA by PCR NEGATIVE NEGATIVE Final    Comment:        The GeneXpert MRSA Assay (FDA approved for NASAL specimens only), is one component of a comprehensive MRSA colonization surveillance program. It is not intended to diagnose MRSA infection nor to guide or monitor treatment for MRSA infections.     Medical History: Past Medical History  Diagnosis Date  . Hypertension   . Hyperlipidemia   . Pre-diabetes   . Thyroid disease   . Thrombocytosis   . GERD (gastroesophageal reflux disease)   . Vitamin D deficiency     Assessment: 32 yoM presented to ED on 9/7 with weakness, falls, and abdominal pain.  PMH includes HTN, HLD, thrombocytosis (on Hydroxyurea), epistaxis, iron deficiency anemia, GERD.  He was recently on Keflex for cellulitis of  distal left forearm, started on 9/1.  Initially, without clear source of infection, it was planned to watch him OFF antibiotics.  This morning, pharmacy is consulted to dose Vancomycin and Zosyn for sepsis.  9/8 >> Zosyn >> 9/8 >> Vanc >>    Today, 06/01/2015:  Tm 102.5  WBC 16.8  SCr 1.42 with CrCl ~ 35 ml/min  Blood culture pending.   Goal of Therapy:  Vancomycin trough level 15-20 mcg/ml  Appropriate abx dosing, eradication of infection.   Plan:   Zosyn 3.375g IV Q8H infused over 4hrs.   Vancomycin IV q24h  Measure Vanc trough at steady state.  Follow up renal fxn, culture results, and clinical course.   Gretta Arab PharmD, BCPS Pager [phone] 06/01/2015 7:53 AM

## 2015-06-01 NOTE — Consult Note (Addendum)
CARDIOLOGY CONSULT NOTE   Patient ID: Jack Schultz MRN: [id number] DOB/AGE: [id number] 79 y.o.  Admit date: 05/31/2015  Primary Physician   Unk Pinto DAVID, MD Primary Cardiologist  New Reason for Consultation  Concern for CHB on ECG and tele  HPI: Jack Schultz is a 81 y.o. male with a history of HTN, HLD, thrombocytosis (on hydroxyurea, sees Dr. [name]), recurrent epistaxis for to be related to myeloproliferative disorder and aspirin therapy, hypothyroidism, iron deficiency anemia and GERD brought to the ED on 05/31/15 by family as patient has been progressively getting weak for the past 4 weeks with poor by mouth intake and recurrent falls.  Patient reports having frequent falls without any loss of consciousness or sustaining any injury. He was seen by his PCP on 9/1 and was found to have cellulitis of his distal left forearm and prescribed a 2 weeks course of Keflex. He reports that since then he has been having pain in his left arm and shoulder. Family also reports patient having off-and-on diffuse abdominal pain past 1 week. No associated nausea and vomiting or bowel symptoms. Patient denies headache, dizziness, fever, chills, nausea , vomiting, chest pain, palpitations, SOB, abdominal pain, bowel or urinary symptoms. Denies change in weight but has poor appetite for last 3 weeks. Course in the ED Patient blood pressure was low normal at 94/48 mmHg O2 sat of 89% on room air. Blood will done showed significant leukocytosis with WBC of 17.6, hemoglobin of 7.4 (baseline hemoglobin of 9-10), clumped platelets. Chemistry showed sodium of 129, potassium 5.4, chloride of 97, BUN of 42 and creatinine 1.48. Glucose 119. Chest x-ray and UA were unremarkable. He is now febrile at 102 deg F.   He was admitted and ECG noted to have CHB with what looks like an accelerated junctional escape rhythm. HR in 70s. Cardiology was consulted for further recommendations.   The patient denies  any lightheadedness or dizziness or syncope. He has felt weak over the past month which is what prompted admission. Before this month he was very active and liked to get outdoors. He has not been able to do that lately and this has made his family very concerned. He denies CP, SOB, orthopnea, Le edema or PND.   Past Medical History  Diagnosis Date  . Hypertension   . Hyperlipidemia   . Pre-diabetes   . Thyroid disease   . Thrombocytosis   . GERD (gastroesophageal reflux disease)   . Vitamin D deficiency      History reviewed. No pertinent past surgical history.  Allergies  Allergen Reactions  . Ace Inhibitors Cough    I have reviewed the patient's current medications . cholecalciferol  5,000 Units Oral Daily  . ferrous sulfate  325 mg Oral Daily  . levothyroxine  50 mcg Oral QAC breakfast  . piperacillin-tazobactam  3.375 g Intravenous STAT  . piperacillin-tazobactam (ZOSYN)  IV  3.375 g Intravenous Q8H  . vancomycin  1,000 mg Intravenous STAT   . sodium chloride 75 mL/hr at 06/01/15 0600   acetaminophen **OR** acetaminophen, ondansetron **OR** ondansetron (ZOFRAN) IV  Prior to Admission medications   Medication Sig Start Date End Date Taking? Authorizing Provider  aspirin 81 MG tablet Take 81 mg by mouth daily.   Yes Historical Provider, MD  atenolol (TENORMIN) 50 MG tablet TAKE 1 TABLET AT BEDTIME Patient taking differently: TAKE 50 MG BY MOUTH AT BEDTIME 05/19/15  Yes Unk Pinto, MD  cephALEXin (KEFLEX) 500 MG capsule Take 1  capsule 4 x day with meals & bedtime for infection Patient taking differently: Take 500 mg by mouth 4 (four) times daily.  05/25/15 06/24/15 Yes Unk Pinto, MD  Cholecalciferol (VITAMIN D PO) Take 5,000 Units by mouth daily.   Yes Historical Provider, MD  ferrous sulfate 325 (65 FE) MG tablet Take 325 mg by mouth daily with breakfast.   Yes Historical Provider, MD  furosemide (LASIX) 40 MG tablet TAKE 1 TABLET EVERY DAY Patient taking  differently: TAKE 40 MG BY MOUTH EVERY DAY 05/27/15  Yes Unk Pinto, MD  hydroxyurea (HYDREA) 500 MG capsule TAKE ONE CAPSULE BY MOUTH EVERY DAY Patient taking differently: TAKE 500 MG BY MOUTH EVERY DAY 04/20/15  Yes Ladell Pier, MD  levothyroxine (SYNTHROID, LEVOTHROID) 50 MCG tablet TAKE 1 TABLET BY MOUTH EVERY DAY Patient taking differently: TAKE 50 MCG BY MOUTH DAILY. 02/06/15  Yes Vicie Mutters, PA-C     Social History   Social History  . Marital Status: Married    Spouse Name: N/A  . Number of Children: N/A  . Years of Education: N/A   Occupational History  . Not on file.   Social History Main Topics  . Smoking status: Former Smoker    Quit date: 09/24/1975  . Smokeless tobacco: Current User  . Alcohol Use: No  . Drug Use: No  . Sexual Activity: No   Other Topics Concern  . Not on file   Social History Narrative    Family Status  Relation Status Death Age  . Mother Deceased   . Father Deceased    Family History  Problem Relation Age of Onset  . Hypertension Father   . Heart disease Father      ROS:  Full 14 point review of systems complete and found to be negative unless listed above.  Physical Exam: Blood pressure 129/43, pulse 70, temperature 98.9 F (37.2 C), temperature source Oral, resp. rate 19, height 5\' 9"  (1.753 m), weight 171 lb 1.2 oz (77.6 kg), SpO2 91 %.  General: Well developed, well nourished, male in no acute distress Head: Eyes PERRLA, No xanthomas.   Normocephalic and atraumatic, oropharynx without edema or exudate.   Lungs: CTAB Heart: HRRR S1 S2, no rub/gallop, Heart irregular rate and rhythm with S1, S2  murmur. pulses are 2+ extrem.   Neck: No carotid bruits. No lymphadenopathy. No JVD. Abdomen: Bowel sounds present, abdomen soft and non-tender without masses or hernias noted. Msk:  No spine or cva tenderness. No weakness, no joint deformities or effusions. Extremities: No clubbing or cyanosis. No  edema.  Neuro: Alert and  oriented X 3. No focal deficits noted. Psych:  Good affect, responds appropriately Skin: No rashes or lesions noted.  Labs:   Lab Results  Component Value Date   WBC 16.8* 06/01/2015   HGB 7.2* 06/01/2015   HCT 22.7* 06/01/2015   MCV 94.6 06/01/2015   PLT 390 06/01/2015   No results for input(s): INR in the last 72 hours.  Recent Labs Lab 05/31/15 1022 06/01/15 0347  NA 129* 130*  K 5.4* 3.8  CL 97* 97*  CO2 22 23  BUN 42* 44*  CREATININE 1.48* 1.42*  CALCIUM 8.6* 8.0*  PROT 6.2*  --   BILITOT 1.9*  --   ALKPHOS 103  --   ALT 24  --   AST 49*  --   GLUCOSE 119* 134*  ALBUMIN 2.9*  --    MAGNESIUM  Date Value Ref Range Status  01/10/2015  2.1 1.5 - 2.5 mg/dL Final    Recent Labs  05/31/15 1530  CKTOTAL 50    Lab Results  Component Value Date   CHOL 125 01/10/2015   HDL 23* 01/10/2015   LDLCALC 74 01/10/2015   TRIG 138 01/10/2015   No results found for: DDIMER LIPASE  Date/Time Value Ref Range Status  05/31/2015 10:22 AM 20* 22 - 51 U/L Final   TSH  Date/Time Value Ref Range Status  05/31/2015 04:50 PM 0.841 0.350 - 4.500 uIU/mL Final  01/10/2015 05:02 PM 2.893 0.350 - 4.500 uIU/mL Final   VITAMIN B-12  Date/Time Value Ref Range Status  06/01/2015 03:47 AM 975* 180 - 914 pg/mL Final    Comment:    (NOTE) This assay is not validated for testing neonatal or myeloproliferative syndrome specimens for Vitamin B12 levels. Performed at [facility]  Date/Time Value Ref Range Status  12/15/2007 12:35 PM 35 22 - 322 ng/mL Final    Radiology:  Dg Chest 2 View  05/31/2015   CLINICAL DATA:  Progressive, predominantly lower extremity weakness over the past year, worse in the last week. Fevers and decreased appetite.  EXAM: CHEST  2 VIEW  COMPARISON:  None.  FINDINGS: The cardiac silhouette is upper limits of normal in size. Thoracic aortic calcification is noted. There is slight coarsening of the interstitial markings bilaterally. No  confluent airspace consolidation, edema, pleural effusion, or pneumothorax is identified. Curvilinear lucency projecting over the lateral left lung base likely represents a skin fold. Thoracic spondylosis is noted.  IMPRESSION: No active cardiopulmonary disease.   Electronically Signed   By: Logan Bores M.D.   On: 05/31/2015 10:38   Dg Shoulder 1v Left  05/31/2015   CLINICAL DATA:  Left shoulder pain, remote trauma  EXAM: LEFT SHOULDER - 1 VIEW  COMPARISON:  None.  FINDINGS: Severe left glenohumeral joint degenerative change. High-riding left humeral head may indicate rotator cuff tendinopathy. AC joint degenerative change.  IMPRESSION: Degenerative change as above.   Electronically Signed   By: Conchita Paris M.D.   On: 05/31/2015 21:04   Ct Head Wo Contrast  06/01/2015   CLINICAL DATA:  Prior headache, now resolved. Altered mental status. History of hypertension.  EXAM: CT HEAD WITHOUT CONTRAST  TECHNIQUE: Contiguous axial images were obtained from the base of the skull through the vertex without intravenous contrast.  COMPARISON:  None.  FINDINGS: The ventricles and sulci are normal for age. No intraparenchymal hemorrhage, mass effect nor midline shift. Patchy supratentorial white matter hypodensities are less than expected for patient's age and though non-specific suggest sequelae of chronic small vessel ischemic disease. No acute large vascular territory infarcts.  No abnormal extra-axial fluid collections. Basal cisterns are patent. Moderate calcific atherosclerosis of the carotid siphons.  No skull fracture. The included ocular globes and orbital contents are non-suspicious. Small paranasal sinus mucosal retention cysts without paranasal sinus air-fluid levels. The mastoid air cells are well aerated.  IMPRESSION: Negative noncontrast CT head for age.   Electronically Signed   By: Elon Alas M.D.   On: 06/01/2015 00:39   US Abdomen Port  06/01/2015   CLINICAL DATA:  Abdominal pain.  EXAM:  ULTRASOUND PORTABLE ABDOMEN  COMPARISON:  None.  FINDINGS: Gallbladder: Limited visualization due to patient positioning. No gallstones or wall thickening visualized. No sonographic Murphy sign noted.  Common bile duct: Diameter: 3.5 mm, normal  Liver: No focal lesion identified. Within normal limits in parenchymal echogenicity.  IVC: No  abnormality visualized.  Pancreas: Not visualized due to overlying bowel gas.  Spleen: Spleen length measures 13.4 cm with volume 328 mL. Circumscribed focal hyperechoic lesions are demonstrated in the spleen, largest measuring 2 cm maximal diameter and smaller measuring 1.7 cm maximal diameter. These are likely to represent hemangiomas.  Right Kidney: Length: 10.5 cm. Echogenicity within normal limits. No mass or hydronephrosis visualized.  Left Kidney: Length: 10.9 cm. Echogenicity within normal limits. No mass or hydronephrosis visualized.  Abdominal aorta: No aneurysm visualized.  Other findings: Small bilateral pleural effusions noted.  IMPRESSION: Splenic enlargement. Focal hyperechoic lesions in the spleen likely representing hemangiomas. Small bilateral pleural effusions are noted.   Electronically Signed   By: Lucienne Capers M.D.   On: 06/01/2015 00:51   Dg Humerus Left  05/31/2015   CLINICAL DATA:  Chronic left humeral pain, remote injury  EXAM: LEFT HUMERUS - 2+ VIEW  COMPARISON:  None.  FINDINGS: There is no evidence of fracture or other focal bone lesions. Soft tissues are unremarkable. Severe left glenohumeral joint degenerative change.  IMPRESSION: No acute abnormality.   Electronically Signed   By: Conchita Paris M.D.   On: 05/31/2015 20:49    ASSESSMENT AND PLAN:    Principal Problem:   Symptomatic anemia Active Problems:   Essential thrombocytosis   Essential hypertension   Vitamin D deficiency   Hypothyroidism   Hyperkalemia   Hyponatremia   Acute kidney injury   Frequent falls   Leucocytosis  Jack Schultz is a 81 y.o. male with a  history of HTN, HLD, thrombocytosis (on hydroxyurea, sees Dr. [name]), recurrent epistaxis for to be related to myeloproliferative disorder and aspirin therapy, hypothyroidism, iron deficiency anemia and GERD brought to the ED on 05/31/15 by family as patient has been progressively getting weak for the past 4 weeks with poor by mouth intake and recurrent falls.  Complete heart block vs AV disassociation- He was admitted and ECG noted to have CHB with what looks like an accelerated junctional escape rhythm. HR in 60-70s. He was placed on tele which reveals continued AV disassociation with HRs in 70s. QRS is narrow -- TSH is normal. He takes atenolol at home. This has been held since admission. Last dose on 05/30/15. He was also noted to have hyperkalemia on admission, this has now been corrected ( possibly due to hemolysis in sample).  -- 2D ECHO pending -- He is HD stable. Patient asymptomatic but does have profound weakness lately which could be due to acute infection and anemia. Bedside pacer pads ordered by IM. Dr. Sallyanne Kuster to see today for further recommendations.  Leukocytosis -- No clear source of infection. No signs of cellulitis over left arm. Has taken 7 days of oral Keflex already. Chest x-ray and UA unremarkable.Blood cultures pending. Now febrile and started on ABx.  Symptomatic anemia -- H/H 7.2/22.7: Being transfused with 2 units PRBC. Check stool for occult blood.  Generalized weakness with frequent fall -- Possibly associated with anemia and dehydration.  -- Head CT negative   Hyponatremia/hyperkalemia -- Gentle IV hydration with normal saline. Given kayexalate. Now K normal   Acute kidney injury -- Secondary to dehydration. Monitor with IV fluids.  Essential thrombocytosis -- Has associated frequent epistaxis with myeloproliferative disorder and aspirin use.  Left arm and shoulder pain -- Denies any injury sustained with falls. Per IM  Hypotension- now resolved w/ IVF and  holding BP meds. -- Blood pressure medications held due to hypotension and CHB on ECG.  Hypothyroidism --  Continue Synthroid. TSH normal.    Signed: Eileen Stanford, PA-C 06/01/2015 8:45 AM  Pager [phone]  Co-Sign MD As above, patient seen and examined. Briefly he is an 81 year old male with past medical history of essential thrombocytosis, hypertension for evaluation of complete heart block. Patient was previously active. Over the past 6 months he has noticed increased dyspnea on exertion, weakness in his lower extremities and has increased falls. He was treated recently with a course of Keflex for upper extremity cellulitis by report. He has complained of some abdominal pain since initiating that medication. He was brought to the emergency room for the above complaints. He has been found to be in complete heart block and cardiology asked to evaluate. Electrocardiogram shows sinus rhythm with complete heart block and narrow ventricular escape. Complete heart block could certainly be contributing to his symptoms of progressive dyspnea on exertion, weakness and falls although he denies syncope. We will arrange an echocardiogram to assess LV function. His atenolol has been discontinued since September 6. He remains in complete heart block and will likely need a pacemaker. However he is febrile with etiology unclear. He has been placed on antibiotics by primary care. We will follow temperature curve and cultures. I will ask electrophysiology to see. Gentle hydration for what appears to be prerenal azotemia. CHB with reduced cardiac output could also be contributing to diminshed renal perfusion. Note no peripheral stigmata of SBE; echo pending [facility]s

## 2015-06-01 NOTE — Progress Notes (Signed)
  Echocardiogram 2D Echocardiogram has been performed.  Tresa Res 06/01/2015, 2:01 PM

## 2015-06-01 NOTE — Progress Notes (Signed)
[facility] PROGRESS NOTE  Jack Schultz JHE:[id number] DOB: 11-10-1923 DOA: 05/31/2015 PCP: [name], MD  Assessment/Plan: Sepsis Etiology unclear. Chest x-ray and UA unremarkable. Sepsis pathway initiated on admission. Follow blood cultures and 2 D results. Given findings of heart block and sepsis  endocarditis needs to be ruled out. Check x-ray of the left wrist to rule out effusion. Check repeat uric acid level. Continue IV hydration with intermittent boluses given hypertensive episodes. -Check ESR and CRP. -Consulted ID Dr. Megan Salon for evaluation and further recommendation.  AV dissociation versus complete heart block Continue to monitor on telemetry. Discontinue metoprolol. Heart rate maintained in the 70s-80s. Cardiology following. Check 2-D echo results. Need to rule out vegetation given underlying sepsis.  Hypotension Secondary to sepsis. Holding all blood pressure medications. Monitor with IV fluids.  Symptomatic anemia Baseline hemoglobin of around 9-10. Hemoglobin of 7.2 upon presentation. Received 2 units PRBC. Follow posttransfusion H&H. Stool for occult blood negative. TSH and B12 normal.  Generalized weakness with frequent falls Appears to be multifactorial with sepsis, anemia and dehydration. A.m. cortisol normal. Normal TSH and B12.  Left hand and shoulder pain Reports symptoms for the past 3 weeks. X-ray of the left humerus unremarkable. X-ray of the left shoulder shows degenerative changes without any injury or effusion. Has tenderness over left wrist with some swelling. Will check x-ray of the left wrist. Check repeat uric acid. (Was normal 1 week back as outpatient).  Abdominal pain Result. Abdominal ultrasound unremarkable.  DVT prophylaxis: SCDs  Diet: Regular   Code Status: Full code Family Communication: Discussed with son and daughter at bedside Disposition Plan: Continue step down  monitoring   Consultants:  Cardiology  Infectious disease  Procedures:  CT head  Ultrasound abdomen  2-D echo (results pending)  Antibiotics:  IV vancomycin and Zosyn since 9/7--  HPI/Subjective: Patient seen and examined. Had a temperature spike of 102.22F last evening. Started on empiric vancomycin and Zosyn. Still complains of weakness and left arm pain  Objective: Filed Vitals:   06/01/15 1225  BP: 94/31  Pulse: 62  Temp:   Resp: 19    Intake/Output Summary (Last 24 hours) at 06/01/15 1407 Last data filed at 06/01/15 1000  Gross per 24 hour  Intake 2032.08 ml  Output    300 ml  Net 1732.08 ml   Filed Weights   05/31/15 1710  Weight: 77.6 kg (171 lb 1.2 oz)    Exam:   General:  Elderly male lying in bed in no acute distress  HEENT: Pallor present, no icterus, moist oral mucosa, supple neck  Chest: Clear to auscultation bilaterally, no added sounds    CVS: Normal S1 and S2, no murmurs rub or gallop  GI: Soft, nondistended, nontender, bowel sounds present  Musculoskeletal: Warm, no edema, tender to pressure over left wrist, left arm and shoulder  CNS: Alert and oriented   Data Reviewed: Basic Metabolic Panel:  Recent Labs Lab 05/31/15 1022 06/01/15 0347  NA 129* 130*  K 5.4* 3.8  CL 97* 97*  CO2 22 23  GLUCOSE 119* 134*  BUN 42* 44*  CREATININE 1.48* 1.42*  CALCIUM 8.6* 8.0*   Liver Function Tests:  Recent Labs Lab 05/31/15 1022  AST 49*  ALT 24  ALKPHOS 103  BILITOT 1.9*  PROT 6.2*  ALBUMIN 2.9*    Recent Labs Lab 05/31/15 1022  LIPASE 20*   No results for input(s): AMMONIA in the last 168 hours. CBC:  Recent Labs Lab 05/25/15 1526 05/31/15 1022  06/01/15 0347  WBC 13.4* 17.6* 16.8*  NEUTROABS 11.8* 15.7*  --   HGB 8.5* 7.4* 7.2*  HCT 26.3* 23.3* 22.7*  MCV 93.6 94.7 94.6  PLT 493* PLATELET CLUMPS NOTED ON SMEAR, COUNT APPEARS ADEQUATE 390   Cardiac Enzymes:  Recent Labs Lab 05/31/15 1530  CKTOTAL 50    BNP (last 3 results) No results for input(s): BNP in the last 8760 hours.  ProBNP (last 3 results) No results for input(s): PROBNP in the last 8760 hours.  CBG:  Recent Labs Lab 05/31/15 0923 06/01/15 1138  GLUCAP 159* 170*    Recent Results (from the past 240 hour(s))  Culture, blood (routine x 2)     Status: None (Preliminary result)   Collection Time: 05/31/15  3:40 PM  Result Value Ref Range Status   Specimen Description BLOOD LEFT ANTECUBITAL  Final   Special Requests BOTTLES DRAWN AEROBIC AND ANAEROBIC 5CC  Final   Culture   Final    NO GROWTH < 24 HOURS Performed at [facility]    Report Status PENDING  Incomplete  Culture, blood (routine x 2)     Status: None (Preliminary result)   Collection Time: 05/31/15  3:45 PM  Result Value Ref Range Status   Specimen Description BLOOD BLOOD RIGHT HAND  Final   Special Requests BOTTLES DRAWN AEROBIC AND ANAEROBIC 5CC  Final   Culture   Final    NO GROWTH < 24 HOURS Performed at [facility]    Report Status PENDING  Incomplete  MRSA PCR Screening     Status: None   Collection Time: 05/31/15  6:55 PM  Result Value Ref Range Status   MRSA by PCR NEGATIVE NEGATIVE Final    Comment:        The GeneXpert MRSA Assay (FDA approved for NASAL specimens only), is one component of a comprehensive MRSA colonization surveillance program. It is not intended to diagnose MRSA infection nor to guide or monitor treatment for MRSA infections.      Studies: Dg Chest 2 View  05/31/2015   CLINICAL DATA:  Progressive, predominantly lower extremity weakness over the past year, worse in the last week. Fevers and decreased appetite.  EXAM: CHEST  2 VIEW  COMPARISON:  None.  FINDINGS: The cardiac silhouette is upper limits of normal in size. Thoracic aortic calcification is noted. There is slight coarsening of the interstitial markings bilaterally. No confluent airspace consolidation, edema, pleural effusion, or  pneumothorax is identified. Curvilinear lucency projecting over the lateral left lung base likely represents a skin fold. Thoracic spondylosis is noted.  IMPRESSION: No active cardiopulmonary disease.   Electronically Signed   By: Allen  Grady M.D.   On: 05/31/2015 10:38   Dg Shoulder 1v Left  05/31/2015   CLINICAL DATA:  Left shoulder pain, remote trauma  EXAM: LEFT SHOULDER - 1 VIEW  COMPARISON:  None.  FINDINGS: Severe left glenohumeral joint degenerative change. High-riding left humeral head may indicate rotator cuff tendinopathy. AC joint degenerative change.  IMPRESSION: Degenerative change as above.   Electronically Signed   By: Gretchen  Green M.D.   On: 05/31/2015 21:04   Ct Head Wo Contrast  06/01/2015   CLINICAL DATA:  Prior headache, now resolved. Altered mental status. History of hypertension.  EXAM: CT HEAD WITHOUT CONTRAST  TECHNIQUE: Contiguous axial images were obtained from the base of the skull through the vertex without intravenous contrast.  COMPARISON:  None.  FINDINGS: The ventricles and sulci are normal for age.   No intraparenchymal hemorrhage, mass effect nor midline shift. Patchy supratentorial white matter hypodensities are less than expected for patient's age and though non-specific suggest sequelae of chronic small vessel ischemic disease. No acute large vascular territory infarcts.  No abnormal extra-axial fluid collections. Basal cisterns are patent. Moderate calcific atherosclerosis of the carotid siphons.  No skull fracture. The included ocular globes and orbital contents are non-suspicious. Small paranasal sinus mucosal retention cysts without paranasal sinus air-fluid levels. The mastoid air cells are well aerated.  IMPRESSION: Negative noncontrast CT head for age.   Electronically Signed   By: Courtnay  Bloomer M.D.   On: 06/01/2015 00:39   Us Abdomen Port  06/01/2015   CLINICAL DATA:  Abdominal pain.  EXAM: ULTRASOUND PORTABLE ABDOMEN  COMPARISON:  None.  FINDINGS:  Gallbladder: Limited visualization due to patient positioning. No gallstones or wall thickening visualized. No sonographic Murphy sign noted.  Common bile duct: Diameter: 3.5 mm, normal  Liver: No focal lesion identified. Within normal limits in parenchymal echogenicity.  IVC: No abnormality visualized.  Pancreas: Not visualized due to overlying bowel gas.  Spleen: Spleen length measures 13.4 cm with volume 328 mL. Circumscribed focal hyperechoic lesions are demonstrated in the spleen, largest measuring 2 cm maximal diameter and smaller measuring 1.7 cm maximal diameter. These are likely to represent hemangiomas.  Right Kidney: Length: 10.5 cm. Echogenicity within normal limits. No mass or hydronephrosis visualized.  Left Kidney: Length: 10.9 cm. Echogenicity within normal limits. No mass or hydronephrosis visualized.  Abdominal aorta: No aneurysm visualized.  Other findings: Small bilateral pleural effusions noted.  IMPRESSION: Splenic enlargement. Focal hyperechoic lesions in the spleen likely representing hemangiomas. Small bilateral pleural effusions are noted.   Electronically Signed   By: William  Stevens M.D.   On: 06/01/2015 00:51   Dg Humerus Left  05/31/2015   CLINICAL DATA:  Chronic left humeral pain, remote injury  EXAM: LEFT HUMERUS - 2+ VIEW  COMPARISON:  None.  FINDINGS: There is no evidence of fracture or other focal bone lesions. Soft tissues are unremarkable. Severe left glenohumeral joint degenerative change.  IMPRESSION: No acute abnormality.   Electronically Signed   By: Gretchen  Green M.D.   On: 05/31/2015 20:49    Scheduled Meds: . cholecalciferol  5,000 Units Oral Daily  . ferrous sulfate  325 mg Oral Daily  . levothyroxine  50 mcg Oral QAC breakfast  . piperacillin-tazobactam (ZOSYN)  IV  3.375 g Intravenous Q8H  . vancomycin  1,000 mg Intravenous Daily   Continuous Infusions: . sodium chloride 75 mL/hr at 06/01/15 0600      Time spent: 35 minutes    ,    [facility] Pager [phone] If 7PM-7AM, please contact night-coverage at [email or web address], password [email or web address] 06/01/2015, 2:07 PM  LOS: 1 day             

## 2015-06-02 ENCOUNTER — Other Ambulatory Visit: Payer: Self-pay

## 2015-06-02 DIAGNOSIS — G8929 Other chronic pain: Secondary | ICD-10-CM

## 2015-06-02 DIAGNOSIS — IMO0001 Reserved for inherently not codable concepts without codable children: Secondary | ICD-10-CM | POA: Insufficient documentation

## 2015-06-02 DIAGNOSIS — M25512 Pain in left shoulder: Secondary | ICD-10-CM

## 2015-06-02 LAB — BASIC METABOLIC PANEL
ANION GAP: 10 (ref 5–15)
BUN: 42 mg/dL — ABNORMAL HIGH (ref 6–20)
CALCIUM: 7.5 mg/dL — AB (ref 8.9–10.3)
CHLORIDE: 99 mmol/L — AB (ref 101–111)
CO2: 19 mmol/L — AB (ref 22–32)
CREATININE: 1.55 mg/dL — AB (ref 0.61–1.24)
GFR calc non Af Amer: 38 mL/min — ABNORMAL LOW (ref >60)
GFR, EST AFRICAN AMERICAN: 44 mL/min — AB (ref >60)
GLUCOSE: 152 mg/dL — AB (ref 65–99)
Potassium: 3.7 mmol/L (ref 3.5–5.1)
Sodium: 128 mmol/L — ABNORMAL LOW (ref 135–145)

## 2015-06-02 LAB — GLUCOSE, CAPILLARY: GLUCOSE-CAPILLARY: 155 mg/dL — AB (ref 65–99)

## 2015-06-02 LAB — TROPONIN I
TROPONIN I: 0.08 ng/mL — AB (ref <0.031)
Troponin I: 0.08 ng/mL — ABNORMAL HIGH (ref <0.031)
Troponin I: 0.08 ng/mL — ABNORMAL HIGH (ref <0.031)

## 2015-06-02 LAB — TYPE AND SCREEN
ABO/RH(D): B NEG
Antibody Screen: NEGATIVE
UNIT DIVISION: 0
UNIT DIVISION: 0

## 2015-06-02 LAB — CBC
HEMATOCRIT: 26.7 % — AB (ref 39.0–52.0)
HEMOGLOBIN: 8.6 g/dL — AB (ref 13.0–17.0)
MCH: 29.7 pg (ref 26.0–34.0)
MCHC: 32.2 g/dL (ref 30.0–36.0)
MCV: 92.1 fL (ref 78.0–100.0)
Platelets: 436 10*3/uL — ABNORMAL HIGH (ref 150–400)
RBC: 2.9 MIL/uL — AB (ref 4.22–5.81)
RDW: 24.1 % — ABNORMAL HIGH (ref 11.5–15.5)
WBC: 23.8 10*3/uL — ABNORMAL HIGH (ref 4.0–10.5)

## 2015-06-02 LAB — MAGNESIUM: Magnesium: 2.1 mg/dL (ref 1.7–2.4)

## 2015-06-02 MED ORDER — SODIUM CHLORIDE 0.9 % IV SOLN
INTRAVENOUS | Status: DC
  Administered 2015-06-02: 17:00:00 via INTRAVENOUS
  Administered 2015-06-03: 1000 mL via INTRAVENOUS

## 2015-06-02 MED ORDER — SODIUM CHLORIDE 0.9 % IV BOLUS (SEPSIS): 500.0000 mL | Freq: Once | INTRAVENOUS | Status: DC

## 2015-06-02 NOTE — Progress Notes (Signed)
Patient ID: Jack Schultz, male   DOB: 12-27-1923, 81 y.o.   MRN: [id number]         [facility]    Date of Admission:  05/31/2015    Total days of antibiotics 9        Day 3 vancomycin and ceftriaxone         Principal Problem:   Fever Active Problems:   Symptomatic anemia   Acute kidney injury   Frequent falls   Complete heart block   Acute pain of left shoulder   Cellulitis of arm, left   Essential thrombocytosis   Essential hypertension   Vitamin D deficiency   Hypothyroidism   Hyperkalemia   Hyponatremia   Leucocytosis   . cefTRIAXone (ROCEPHIN)  IV  1 g Intravenous Q24H  . cholecalciferol  5,000 Units Oral Daily  . ferrous sulfate  325 mg Oral Daily  . levothyroxine  50 mcg Oral QAC breakfast  . sodium chloride  500 mL Intravenous Once  . vancomycin  1,000 mg Intravenous Daily    SUBJECTIVE: He states that he is feeling much better. He tells me that his left shoulder pain is chronic and unchanged. His daughter tells me that he has complained of it being much worse recently.  Review of Systems: Pertinent items are noted in HPI.  Past Medical History  Diagnosis Date  . Hypertension   . Hyperlipidemia   . Pre-diabetes   . Thyroid disease   . Thrombocytosis   . GERD (gastroesophageal reflux disease)   . Vitamin D deficiency     Social History  Substance Use Topics  . Smoking status: Former Smoker    Quit date: 09/24/1975  . Smokeless tobacco: Current User  . Alcohol Use: No    Family History  Problem Relation Age of Onset  . Hypertension Father   . Heart disease Father    Allergies  Allergen Reactions  . Ace Inhibitors Cough    OBJECTIVE: Filed Vitals:   06/02/15 0300 06/02/15 0400 06/02/15 0445 06/02/15 0800  BP: 102/52 107/48 138/53 120/57  Pulse: 68 65 72 69  Temp:  97.8 F (36.6 C)  97.9 F (36.6 C)  TempSrc:  Oral  Oral  Resp: 26 26 30  33  Height:      Weight:      SpO2: 94% 97% 99% 97%   Body  mass index is 25.25 kg/(m^2).  General: He is alert and comfortable visiting with family Skin: Left arm and hand cellulitis has resolved Lungs: Clear Cor: Irregular S1 and S2 with no murmurs Abdomen: Soft and nontender Joints and extremities: Pain with movement of his left shoulder but no other signs of inflammation  Lab Results Lab Results  Component Value Date   WBC 23.8* 06/02/2015   HGB 8.6* 06/02/2015   HCT 26.7* 06/02/2015   MCV 92.1 06/02/2015   PLT 436* 06/02/2015    Lab Results  Component Value Date   CREATININE 1.55* 06/02/2015   BUN 42* 06/02/2015   NA 128* 06/02/2015   K 3.7 06/02/2015   CL 99* 06/02/2015   CO2 19* 06/02/2015    Lab Results  Component Value Date   ALT 24 05/31/2015   AST 49* 05/31/2015   ALKPHOS 103 05/31/2015   BILITOT 1.9* 05/31/2015     Microbiology: Recent Results (from the past 240 hour(s))  Culture, blood (routine x 2)     Status: None (Preliminary result)   Collection Time: 05/31/15  3:40 PM  Result Value Ref Range Status   Specimen Description BLOOD LEFT ANTECUBITAL  Final   Special Requests BOTTLES DRAWN AEROBIC AND ANAEROBIC 5CC  Final   Culture   Final    NO GROWTH < 24 HOURS Performed at [facility]    Report Status PENDING  Incomplete  Culture, blood (routine x 2)     Status: None (Preliminary result)   Collection Time: 05/31/15  3:45 PM  Result Value Ref Range Status   Specimen Description BLOOD BLOOD RIGHT HAND  Final   Special Requests BOTTLES DRAWN AEROBIC AND ANAEROBIC 5CC  Final   Culture   Final    NO GROWTH < 24 HOURS Performed at [facility]    Report Status PENDING  Incomplete  MRSA PCR Screening     Status: None   Collection Time: 05/31/15  6:55 PM  Result Value Ref Range Status   MRSA by PCR NEGATIVE NEGATIVE Final    Comment:        The GeneXpert MRSA Assay (FDA approved for NASAL specimens only), is one component of a comprehensive MRSA colonization surveillance program. It is  not intended to diagnose MRSA infection nor to guide or monitor treatment for MRSA infections.    TTE: No evidence of endocarditis  ASSESSMENT: He has defervesced and is improving clinically on empiric IV antibiotics. Blood cultures are negative to date but may be falsely negative due to recent outpatient cephalexin therapy. I would continue current antibiotics for now.  PLAN: 1. Continue current antibiotics and await results of final blood cultures  Michel Bickers, MD [facility] [place] [phone] pager   [phone] cell 06/02/2015, 11:14 AM

## 2015-06-02 NOTE — Progress Notes (Signed)
Informed Dr Raeanne Barry about st elevation on the bedside mont.  Dr. Raeanne Barry was already aware.  Irven Baltimore, RN

## 2015-06-02 NOTE — Progress Notes (Signed)
PT Cancellation Note  Patient Details Name: Jack Schultz MRN: [id number] DOB: 01-21-1924   Cancelled Treatment:    Reason Eval/Treat Not Completed: Medical issues which prohibited therapychart notes reviewed. Will return 9/10 am.    [name] 06/02/2015, 3:42 PM Tresa Endo PT [phone]

## 2015-06-02 NOTE — Progress Notes (Signed)
Informed Dr Clementeen Graham about bp of 160/65.  Decreased fluids to 101mL/hr.  Irven Baltimore, RN

## 2015-06-02 NOTE — Progress Notes (Signed)
Pt started to choke on his hamburger and was able to spit it up.  Text paged Dr. Clementeen Graham.  Switch to soft diet.  Irven Baltimore, RN

## 2015-06-02 NOTE — Progress Notes (Signed)
[facility] PROGRESS NOTE  QUENTEZ LOBER XMI:[id number] DOB: 1924/08/02 DOA: 05/31/2015 PCP: [name], MD  Assessment/Plan: Sepsis Etiology unclear. Chest x-ray and UA unremarkable. Sepsis pathway initiated on admission. Blood culture so far negative. 2-D echo does not comment on possible vegetations. (Have ask cardiology to evaluate on that). Elevated ESR and CRP. -X-ray of the left shoulder and wrist negative for effusion. Continue IV hydration with intermittent boluses given hypertensive episodes. -Appreciate ID recommendations. Antibiotic switched to Rocephin and Zosyn.   complete heart block Continue to monitor on telemetry. Discontinued metoprolol. Heart rate maintained in the 70s-80s. Cardiology following. Check 2-D echo with normal EF. doesnot comment on possible vegetation. Has trivial pericardial effusion. . Ideally would need a pacemaker but being held given sepsis.  Elevated troponin Patient became diaphoretic during the night and EKG done showed ST changes in inferior leads (? ST elevation). Troponin of 0.08. Denies any chest pain. Cycle enzymes. Further recommendations per cardiology.  Hypotension Secondary to sepsis and dehydration. Holding all blood pressure medications. Monitor with IV fluids.  Acute kidney injury Possibly due to dehydration and sepsis. Avoid nephrotoxins. Monitor with IV hydration  Symptomatic anemia Baseline hemoglobin of around 9. Hemoglobin of 7.2 upon presentation. Received 2 units PRBC. Improved posttransfusion.. Stool for occult blood negative. TSH and B12 normal.  Generalized weakness with frequent falls Appears to be multifactorial with sepsis, anemia and dehydration. A.m. cortisol normal. Normal TSH and B12.  Left hand and shoulder pain X-rays unremarkable. Has limited mobility of the left arm due to shoulder pain. On the discussion with his daughter at bedside patient has chronic left arm and shoulder pain and seems to  have unchanged.  Abdominal pain Resolved. Abdominal ultrasound unremarkable.  Essential thrombocytosis On hydroxyurea as outpatient. (See Dr. [name]). Has history of recurrent epistaxis suspected due to myeloproliferative disorder and aspirin therapy.  hypothyroidism TSH is normal. Continue Synthroid  DVT prophylaxis: SCDs  Diet: Regular   Code Status: Full code Family Communication: Discussed with son and daughter at bedside Disposition Plan: Continue step down monitoring   Consultants:  Cardiology  Infectious disease  Procedures:  CT head  Ultrasound abdomen  2-D echo   Antibiotics:  IV vancomycin since 9/7--  IV Zosyn 9/ 7-9/8  IV Rocephin 9/8  HPI/Subjective: Patient seen and examined. She was confused during the night with some ST changes on the monitor and soft blood pressure. He was diaphoretic as well. CBG was 155. He was given 5 mL normal saline bolus and cardiology consulted.  Objective: Filed Vitals:   06/02/15 0800  BP: 120/57  Pulse: 69  Temp: 97.9 F (36.6 C)  Resp: 33    Intake/Output Summary (Last 24 hours) at 06/02/15 1121 Last data filed at 06/02/15 0630  Gross per 24 hour  Intake 2217.5 ml  Output   1276 ml  Net  941.5 ml   Filed Weights   05/31/15 1710  Weight: 77.6 kg (171 lb 1.2 oz)    Exam:   General:  Elderly male lying in bed in no acute distress  HEENT: Pallor present, , moist oral mucosa, supple neck  Chest: Clear to auscultation bilaterally, no added sounds    CVS: Normal S1 and S2, no murmurs rub or gallop  GI: Soft, nondistended, nontender, bowel sounds present  Musculoskeletal: Warm, no edema, tender to pressure over left wrist, left arm and shoulder  CNS: Alert and oriented   Data Reviewed: Basic Metabolic Panel:  Recent Labs Lab 05/31/15 1022 06/01/15 0347 06/02/15 0229  NA 129* 130* 128*  K 5.4* 3.8 3.7  CL 97* 97* 99*  CO2 22 23 19*  GLUCOSE 119* 134* 152*  BUN 42* 44* 42*   CREATININE 1.48* 1.42* 1.55*  CALCIUM 8.6* 8.0* 7.5*  MG  --   --  2.1   Liver Function Tests:  Recent Labs Lab 05/31/15 1022  AST 49*  ALT 24  ALKPHOS 103  BILITOT 1.9*  PROT 6.2*  ALBUMIN 2.9*    Recent Labs Lab 05/31/15 1022  LIPASE 20*   No results for input(s): AMMONIA in the last 168 hours. CBC:  Recent Labs Lab 05/31/15 1022 06/01/15 0347 06/01/15 1515 06/02/15 0110  WBC 17.6* 16.8*  --  23.8*  NEUTROABS 15.7*  --   --   --   HGB 7.4* 7.2* 7.9* 8.6*  HCT 23.3* 22.7* 24.8* 26.7*  MCV 94.7 94.6  --  92.1  PLT PLATELET CLUMPS NOTED ON SMEAR, COUNT APPEARS ADEQUATE 390  --  436*   Cardiac Enzymes:  Recent Labs Lab 05/31/15 1530 06/02/15 0110 06/02/15 0822  CKTOTAL 50  --   --   TROPONINI  --  0.08* 0.08*   BNP (last 3 results) No results for input(s): BNP in the last 8760 hours.  ProBNP (last 3 results) No results for input(s): PROBNP in the last 8760 hours.  CBG:  Recent Labs Lab 05/31/15 0923 06/01/15 1138 06/02/15 0039  GLUCAP 159* 170* 155*    Recent Results (from the past 240 hour(s))  Culture, blood (routine x 2)     Status: None (Preliminary result)   Collection Time: 05/31/15  3:40 PM  Result Value Ref Range Status   Specimen Description BLOOD LEFT ANTECUBITAL  Final   Special Requests BOTTLES DRAWN AEROBIC AND ANAEROBIC 5CC  Final   Culture   Final    NO GROWTH < 24 HOURS Performed at [facility]    Report Status PENDING  Incomplete  Culture, blood (routine x 2)     Status: None (Preliminary result)   Collection Time: 05/31/15  3:45 PM  Result Value Ref Range Status   Specimen Description BLOOD BLOOD RIGHT HAND  Final   Special Requests BOTTLES DRAWN AEROBIC AND ANAEROBIC 5CC  Final   Culture   Final    NO GROWTH < 24 HOURS Performed at [facility]    Report Status PENDING  Incomplete  MRSA PCR Screening     Status: None   Collection Time: 05/31/15  6:55 PM  Result Value Ref Range Status   MRSA by  PCR NEGATIVE NEGATIVE Final    Comment:        The GeneXpert MRSA Assay (FDA approved for NASAL specimens only), is one component of a comprehensive MRSA colonization surveillance program. It is not intended to diagnose MRSA infection nor to guide or monitor treatment for MRSA infections.      Studies: Dg Shoulder 1v Left  05/31/2015   CLINICAL DATA:  Left shoulder pain, remote trauma  EXAM: LEFT SHOULDER - 1 VIEW  COMPARISON:  None.  FINDINGS: Severe left glenohumeral joint degenerative change. High-riding left humeral head may indicate rotator cuff tendinopathy. AC joint degenerative change.  IMPRESSION: Degenerative change as above.   Electronically Signed   By: Conchita Paris M.D.   On: 05/31/2015 21:04   Dg Wrist Complete Left  06/01/2015   CLINICAL DATA:  Ulnar-sided wrist pain extending across the posterior hand in into the index finger.  EXAM: LEFT WRIST - COMPLETE 3+  VIEW  COMPARISON:  None.  FINDINGS: No acute fracture or dislocation is identified. There is moderate to severe joint space narrowing at the first [facility] joint with subchondral sclerosis and mild osteophytosis. No lytic or blastic osseous lesion or osseous erosion is identified. Vascular calcifications are noted.  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   Electronically Signed   By: Logan Bores M.D.   On: 06/01/2015 14:54   Ct Head Wo Contrast  06/01/2015   CLINICAL DATA:  Prior headache, now resolved. Altered mental status. History of hypertension.  EXAM: CT HEAD WITHOUT CONTRAST  TECHNIQUE: Contiguous axial images were obtained from the base of the skull through the vertex without intravenous contrast.  COMPARISON:  None.  FINDINGS: The ventricles and sulci are normal for age. No intraparenchymal hemorrhage, mass effect nor midline shift. Patchy supratentorial white matter hypodensities are less than expected for patient's age and though non-specific suggest sequelae of chronic small vessel ischemic  disease. No acute large vascular territory infarcts.  No abnormal extra-axial fluid collections. Basal cisterns are patent. Moderate calcific atherosclerosis of the carotid siphons.  No skull fracture. The included ocular globes and orbital contents are non-suspicious. Small paranasal sinus mucosal retention cysts without paranasal sinus air-fluid levels. The mastoid air cells are well aerated.  IMPRESSION: Negative noncontrast CT head for age.   Electronically Signed   By: Elon Alas M.D.   On: 06/01/2015 00:39   US Abdomen Port  06/01/2015   CLINICAL DATA:  Abdominal pain.  EXAM: ULTRASOUND PORTABLE ABDOMEN  COMPARISON:  None.  FINDINGS: Gallbladder: Limited visualization due to patient positioning. No gallstones or wall thickening visualized. No sonographic Murphy sign noted.  Common bile duct: Diameter: 3.5 mm, normal  Liver: No focal lesion identified. Within normal limits in parenchymal echogenicity.  IVC: No abnormality visualized.  Pancreas: Not visualized due to overlying bowel gas.  Spleen: Spleen length measures 13.4 cm with volume 328 mL. Circumscribed focal hyperechoic lesions are demonstrated in the spleen, largest measuring 2 cm maximal diameter and smaller measuring 1.7 cm maximal diameter. These are likely to represent hemangiomas.  Right Kidney: Length: 10.5 cm. Echogenicity within normal limits. No mass or hydronephrosis visualized.  Left Kidney: Length: 10.9 cm. Echogenicity within normal limits. No mass or hydronephrosis visualized.  Abdominal aorta: No aneurysm visualized.  Other findings: Small bilateral pleural effusions noted.  IMPRESSION: Splenic enlargement. Focal hyperechoic lesions in the spleen likely representing hemangiomas. Small bilateral pleural effusions are noted.   Electronically Signed   By: Lucienne Capers M.D.   On: 06/01/2015 00:51   Dg Humerus Left  05/31/2015   CLINICAL DATA:  Chronic left humeral pain, remote injury  EXAM: LEFT HUMERUS - 2+ VIEW  COMPARISON:   None.  FINDINGS: There is no evidence of fracture or other focal bone lesions. Soft tissues are unremarkable. Severe left glenohumeral joint degenerative change.  IMPRESSION: No acute abnormality.   Electronically Signed   By: Conchita Paris M.D.   On: 05/31/2015 20:49    Scheduled Meds: . cefTRIAXone (ROCEPHIN)  IV  1 g Intravenous Q24H  . cholecalciferol  5,000 Units Oral Daily  . ferrous sulfate  325 mg Oral Daily  . levothyroxine  50 mcg Oral QAC breakfast  . sodium chloride  500 mL Intravenous Once  . vancomycin  1,000 mg Intravenous Daily   Continuous Infusions: . sodium chloride 100 mL/hr at 06/02/15 0751      Time spent: 35 minutes    Mischa Pollard  [facility] Pager [phone] If 7PM-7AM, please contact night-coverage at [email or web address], password [facility] 06/02/2015, 11:21 AM  LOS: 2 days

## 2015-06-02 NOTE — Progress Notes (Signed)
At [place] patient noted to be slightly confused and noted ST elevations on room monitor in lead with rate controlled in the 70's. BP slightly hypotensive.  Pt diaphoretic and had complaints of shoulder pain and some arm pain. CBG 155. 12 lead EKG completed, 500cc NS given, [facility] paged with order for troponin x1 and [facility] paged >1 time with return call at 0150.  Orders rec'd from Dr. Barbaraann Share for labs and continue care.  Will update as needed.

## 2015-06-02 NOTE — Progress Notes (Signed)
Spoke with Suanne Marker with cardiology.  Troponin reported with no series follow up at present.  Will discuss in patient rounds this am.

## 2015-06-02 NOTE — Progress Notes (Signed)
Patient Name: Jack Schultz Date of Encounter: 06/02/2015  Primary Cardiologist New- Dr. Stanford Breed   Principal Problem:   Fever Active Problems:   Essential thrombocytosis   Essential hypertension   Vitamin D deficiency   Hypothyroidism   Symptomatic anemia   Hyperkalemia   Hyponatremia   Acute kidney injury   Frequent falls   Leucocytosis   Complete heart block   Acute pain of left shoulder   Cellulitis of arm, left   Blood poisoning    SUBJECTIVE  Some SOB, states he has been having intermittent SOB. As for chest pain, he states he occasionally has sternal soreness but not this morning.   CURRENT MEDS . cefTRIAXone (ROCEPHIN)  IV  1 g Intravenous Q24H  . cholecalciferol  5,000 Units Oral Daily  . ferrous sulfate  325 mg Oral Daily  . levothyroxine  50 mcg Oral QAC breakfast  . sodium chloride  500 mL Intravenous Once  . vancomycin  1,000 mg Intravenous Daily    OBJECTIVE  Filed Vitals:   06/02/15 0300 06/02/15 0400 06/02/15 0445 06/02/15 0800  BP: 102/52 107/48 138/53 120/57  Pulse: 68 65 72 69  Temp:  97.8 F (36.6 C)  97.9 F (36.6 C)  TempSrc:  Oral  Oral  Resp: 26 26 30  33  Height:      Weight:      SpO2: 94% 97% 99% 97%    Intake/Output Summary (Last 24 hours) at 06/02/15 1138 Last data filed at 06/02/15 0630  Gross per 24 hour  Intake 2217.5 ml  Output   1276 ml  Net  941.5 ml   Filed Weights   05/31/15 1710  Weight: 171 lb 1.2 oz (77.6 kg)    PHYSICAL EXAM  General: Pleasant, NAD. Neuro: Alert and oriented X 3. Moves all extremities spontaneously. Psych: Normal affect. HEENT:  Normal  Neck: Supple without bruits or JVD. Lungs:  Resp regular and unlabored. Diminished bibasilar breath sound. Heart: RRR no s3, s4, or murmurs. Abdomen: Soft, non-tender, non-distended, BS + x 4.  Extremities: No clubbing, cyanosis or edema. DP/PT/Radials 2+ and equal bilaterally. SCD in place  Accessory Clinical Findings  CBC  Recent Labs   05/31/15 1022 06/01/15 0347 06/01/15 1515 06/02/15 0110  WBC 17.6* 16.8*  --  23.8*  NEUTROABS 15.7*  --   --   --   HGB 7.4* 7.2* 7.9* 8.6*  HCT 23.3* 22.7* 24.8* 26.7*  MCV 94.7 94.6  --  92.1  PLT PLATELET CLUMPS NOTED ON SMEAR, COUNT APPEARS ADEQUATE 390  --  342*   Basic Metabolic Panel  Recent Labs  06/01/15 0347 06/02/15 0229  NA 130* 128*  K 3.8 3.7  CL 97* 99*  CO2 23 19*  GLUCOSE 134* 152*  BUN 44* 42*  CREATININE 1.42* 1.55*  CALCIUM 8.0* 7.5*  MG  --  2.1   Liver Function Tests  Recent Labs  05/31/15 1022  AST 49*  ALT 24  ALKPHOS 103  BILITOT 1.9*  PROT 6.2*  ALBUMIN 2.9*    Recent Labs  05/31/15 1022  LIPASE 20*   Cardiac Enzymes  Recent Labs  05/31/15 1530 06/02/15 0110 06/02/15 0822  CKTOTAL 50  --   --   TROPONINI  --  0.08* 0.08*   Thyroid Function Tests  Recent Labs  05/31/15 1650  TSH 0.841    TELE Possible atrial flutter?    ECG  Atrial flutter   Echocardiogram 05/31/2014  LV EF: 55%  -------------------------------------------------------------------  Indications:   Abnormal EKG 794.31.  ------------------------------------------------------------------- History:  Risk factors: Former tobacco use. Hypertension.  ------------------------------------------------------------------- Study Conclusions  - Left ventricle: Abnromal septal motion. The cavity size was normal. Wall thickness was normal. The estimated ejection fraction was 55%. - Aortic valve: There was mild regurgitation. - Mitral valve: There was mild regurgitation. - Left atrium: The atrium was mildly dilated. - Right ventricle: The cavity size was mildly dilated. - Atrial septum: No defect or patent foramen ovale was identified. - Tricuspid valve: There was moderate regurgitation. - Pulmonary arteries: PA peak pressure: 50 mm Hg (S). - Pericardium, extracardiac: A trivial pericardial effusion was identified.      Radiology/Studies  Dg Chest 2 View  05/31/2015   CLINICAL DATA:  Progressive, predominantly lower extremity weakness over the past year, worse in the last week. Fevers and decreased appetite.  EXAM: CHEST  2 VIEW  COMPARISON:  None.  FINDINGS: The cardiac silhouette is upper limits of normal in size. Thoracic aortic calcification is noted. There is slight coarsening of the interstitial markings bilaterally. No confluent airspace consolidation, edema, pleural effusion, or pneumothorax is identified. Curvilinear lucency projecting over the lateral left lung base likely represents a skin fold. Thoracic spondylosis is noted.  IMPRESSION: No active cardiopulmonary disease.   Electronically Signed   By: Logan Bores M.D.   On: 05/31/2015 10:38   Dg Shoulder 1v Left  05/31/2015   CLINICAL DATA:  Left shoulder pain, remote trauma  EXAM: LEFT SHOULDER - 1 VIEW  COMPARISON:  None.  FINDINGS: Severe left glenohumeral joint degenerative change. High-riding left humeral head may indicate rotator cuff tendinopathy. AC joint degenerative change.  IMPRESSION: Degenerative change as above.   Electronically Signed   By: Conchita Paris M.D.   On: 05/31/2015 21:04   Dg Wrist Complete Left  06/01/2015   CLINICAL DATA:  Ulnar-sided wrist pain extending across the posterior hand in into the index finger.  EXAM: LEFT WRIST - COMPLETE 3+ VIEW  COMPARISON:  None.  FINDINGS: No acute fracture or dislocation is identified. There is moderate to severe joint space narrowing at the first [facility] joint with subchondral sclerosis and mild osteophytosis. No lytic or blastic osseous lesion or osseous erosion is identified. Vascular calcifications are noted.  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   Electronically Signed   By: Logan Bores M.D.   On: 06/01/2015 14:54   Ct Head Wo Contrast  06/01/2015   CLINICAL DATA:  Prior headache, now resolved. Altered mental status. History of hypertension.  EXAM: CT HEAD WITHOUT  CONTRAST  TECHNIQUE: Contiguous axial images were obtained from the base of the skull through the vertex without intravenous contrast.  COMPARISON:  None.  FINDINGS: The ventricles and sulci are normal for age. No intraparenchymal hemorrhage, mass effect nor midline shift. Patchy supratentorial white matter hypodensities are less than expected for patient's age and though non-specific suggest sequelae of chronic small vessel ischemic disease. No acute large vascular territory infarcts.  No abnormal extra-axial fluid collections. Basal cisterns are patent. Moderate calcific atherosclerosis of the carotid siphons.  No skull fracture. The included ocular globes and orbital contents are non-suspicious. Small paranasal sinus mucosal retention cysts without paranasal sinus air-fluid levels. The mastoid air cells are well aerated.  IMPRESSION: Negative noncontrast CT head for age.   Electronically Signed   By: Elon Alas M.D.   On: 06/01/2015 00:39   US Abdomen Port  06/01/2015   CLINICAL DATA:  Abdominal pain.  EXAM: ULTRASOUND  PORTABLE ABDOMEN  COMPARISON:  None.  FINDINGS: Gallbladder: Limited visualization due to patient positioning. No gallstones or wall thickening visualized. No sonographic Murphy sign noted.  Common bile duct: Diameter: 3.5 mm, normal  Liver: No focal lesion identified. Within normal limits in parenchymal echogenicity.  IVC: No abnormality visualized.  Pancreas: Not visualized due to overlying bowel gas.  Spleen: Spleen length measures 13.4 cm with volume 328 mL. Circumscribed focal hyperechoic lesions are demonstrated in the spleen, largest measuring 2 cm maximal diameter and smaller measuring 1.7 cm maximal diameter. These are likely to represent hemangiomas.  Right Kidney: Length: 10.5 cm. Echogenicity within normal limits. No mass or hydronephrosis visualized.  Left Kidney: Length: 10.9 cm. Echogenicity within normal limits. No mass or hydronephrosis visualized.  Abdominal aorta: No  aneurysm visualized.  Other findings: Small bilateral pleural effusions noted.  IMPRESSION: Splenic enlargement. Focal hyperechoic lesions in the spleen likely representing hemangiomas. Small bilateral pleural effusions are noted.   Electronically Signed   By: Lucienne Capers M.D.   On: 06/01/2015 00:51   Dg Humerus Left  05/31/2015   CLINICAL DATA:  Chronic left humeral pain, remote injury  EXAM: LEFT HUMERUS - 2+ VIEW  COMPARISON:  None.  FINDINGS: There is no evidence of fracture or other focal bone lesions. Soft tissues are unremarkable. Severe left glenohumeral joint degenerative change.  IMPRESSION: No acute abnormality.   Electronically Signed   By: Conchita Paris M.D.   On: 05/31/2015 20:49    ASSESSMENT AND PLAN  81 yo male with HTN, HLD, thrombocytosis, recurrent epistaxis related to myeloproliferative disorder and ASA, hypothyroidism, iron deficiency anemia and GERD presented to ED on 05/31/2015 for weakness, poor PO intake and recurrent falls. Also noted to have leukocytosis on arrival. Initial EKG concerning for CHB although HR was 60-70s.   1. Complete heart block transitioned to aflutter  - initially in what appears to be CHB although HR was 60-70s on initial EKG, atenolol stopped, now appears to be in atrial flutter with HR 70s. Self rate controlled  - will review telemetry with MD, even if in aflutter, unlikely to be good candidate with recurrent fall and recurrent apistaxis  - TSH normal, last dose 9/6. Echo 06/01/2015 EF 55%, mild AR, mild MR, moderate TR, PA peak pressure 32mmHg.   - per Dr. Stanford Breed, plan to check with EP service. Likely need to clear of fever before considering device therapy, besides he transitioned to what appears to be aflutter rhythm. Not sure why initial CHB has baseline HR of 60-70s, expect a lot lower as HR coming from underlying ventricular intrinsic contraction.   - need to monitor for fluid overload with aggressive hydration. If renal function continue to  get worse, may need to hold vanc. EKG also shows diffusely elevated J point, ?early repol. Trop flat 0.08, no sign of ACS  2. Leukocytosis: Tmax 100.6 overnight.  3. Anemia: Being transfused with 2 units PRBC  4. Generalized weakness with frequent fall  5. Dehydration with AKI: Cr continue to trend up. Echo normal EF  6. Essential thrombocytosis  7. Hypotension  8. Hypothyroidism: TSH normal   Signed, Woodward Ku Pager: [phone]  I have seen and examined the patient along with Almyra Deforest PA-C.  I have reviewed the chart, notes and new data.  I agree with PA's note.  Key new complaints: pleuritic retrosternal pain radiates to the shoulders Key examination changes: regular rhythm seems to indeed be atrial flutter with 4:1 AV block Key new findings /  data: seems to be developing ST elevation in multiple leads in a pattern suggestive of developing pericarditis  PLAN: No plan for pacemaker therapy until infection is cleared. Similarly, no plan for cardioversion or antiarrhythmics since his flutter is asymptomatic and he has had neither bradycardia or tachycardia. There is legitimate concern for endocarditis and would recommend TEE if no other source of infection is identified or if BCx are positive for a typical endocarditis organism. TTE did not show perivalvular abscess or vegetations. Alternatively, pericarditis may be part of an episode of myopericarditis that could also explain the conduction abnormalities and atrial flutter. Finally, the conduction abnormalities may simply be age related abnormalities that were inciddntally discovered during a noncardiac infectious process.  Sanda Klein, MD, [place] [phone] 06/02/2015, 2:24 PM

## 2015-06-03 ENCOUNTER — Other Ambulatory Visit: Payer: Self-pay

## 2015-06-03 DIAGNOSIS — I309 Acute pericarditis, unspecified: Secondary | ICD-10-CM | POA: Insufficient documentation

## 2015-06-03 DIAGNOSIS — D72829 Elevated white blood cell count, unspecified: Secondary | ICD-10-CM

## 2015-06-03 DIAGNOSIS — L03114 Cellulitis of left upper limb: Secondary | ICD-10-CM

## 2015-06-03 DIAGNOSIS — B9689 Other specified bacterial agents as the cause of diseases classified elsewhere: Secondary | ICD-10-CM

## 2015-06-03 DIAGNOSIS — A419 Sepsis, unspecified organism: Principal | ICD-10-CM

## 2015-06-03 LAB — URINALYSIS, ROUTINE W REFLEX MICROSCOPIC
BILIRUBIN URINE: NEGATIVE
GLUCOSE, UA: NEGATIVE mg/dL
HGB URINE DIPSTICK: NEGATIVE
KETONES UR: NEGATIVE mg/dL
LEUKOCYTES UA: NEGATIVE
Nitrite: NEGATIVE
PH: 5.5 (ref 5.0–8.0)
Protein, ur: 30 mg/dL — AB
Specific Gravity, Urine: 1.02 (ref 1.005–1.030)
Urobilinogen, UA: 1 mg/dL (ref 0.0–1.0)

## 2015-06-03 LAB — BASIC METABOLIC PANEL
Anion gap: 11 (ref 5–15)
BUN: 52 mg/dL — AB (ref 6–20)
CHLORIDE: 100 mmol/L — AB (ref 101–111)
CO2: 18 mmol/L — AB (ref 22–32)
CREATININE: 1.82 mg/dL — AB (ref 0.61–1.24)
Calcium: 8 mg/dL — ABNORMAL LOW (ref 8.9–10.3)
GFR calc Af Amer: 36 mL/min — ABNORMAL LOW (ref >60)
GFR calc non Af Amer: 31 mL/min — ABNORMAL LOW (ref >60)
Glucose, Bld: 131 mg/dL — ABNORMAL HIGH (ref 65–99)
Potassium: 4.5 mmol/L (ref 3.5–5.1)
Sodium: 129 mmol/L — ABNORMAL LOW (ref 135–145)

## 2015-06-03 LAB — CBC
HCT: 28.3 % — ABNORMAL LOW (ref 39.0–52.0)
Hemoglobin: 9.1 g/dL — ABNORMAL LOW (ref 13.0–17.0)
MCH: 29.9 pg (ref 26.0–34.0)
MCHC: 32.2 g/dL (ref 30.0–36.0)
MCV: 93.1 fL (ref 78.0–100.0)
PLATELETS: 497 10*3/uL — AB (ref 150–400)
RBC: 3.04 MIL/uL — ABNORMAL LOW (ref 4.22–5.81)
RDW: 24 % — AB (ref 11.5–15.5)
WBC: 20.8 10*3/uL — ABNORMAL HIGH (ref 4.0–10.5)

## 2015-06-03 LAB — URINE MICROSCOPIC-ADD ON

## 2015-06-03 LAB — GLUCOSE, CAPILLARY: Glucose-Capillary: 156 mg/dL — ABNORMAL HIGH (ref 65–99)

## 2015-06-03 MED ORDER — DEXTROSE 50 % IV SOLN
INTRAVENOUS | Status: AC
  Filled 2015-06-03: qty 50

## 2015-06-03 MED ORDER — TRAMADOL HCL 50 MG PO TABS
50.0000 mg | ORAL_TABLET | Freq: Once | ORAL | Status: AC
  Administered 2015-06-03: 50 mg via ORAL
  Filled 2015-06-03: qty 1

## 2015-06-03 MED ORDER — LORAZEPAM 0.5 MG PO TABS
0.5000 mg | ORAL_TABLET | Freq: Once | ORAL | Status: AC
  Administered 2015-06-03: 0.5 mg via ORAL
  Filled 2015-06-03: qty 1

## 2015-06-03 MED ORDER — NITROGLYCERIN 0.4 MG SL SUBL
0.4000 mg | SUBLINGUAL_TABLET | SUBLINGUAL | Status: DC | PRN
  Administered 2015-06-03 – 2015-06-04 (×2): 0.4 mg via SUBLINGUAL
  Filled 2015-06-03: qty 1

## 2015-06-03 MED ORDER — NITROGLYCERIN 0.4 MG SL SUBL
SUBLINGUAL_TABLET | SUBLINGUAL | Status: AC
  Filled 2015-06-03: qty 1

## 2015-06-03 NOTE — Progress Notes (Addendum)
INFECTIOUS DISEASE PROGRESS NOTE  ID: Jack Schultz is a 81 y.o. male with  Principal Problem:   Fever Active Problems:   Essential thrombocytosis   Essential hypertension   Vitamin D deficiency   Hypothyroidism   Symptomatic anemia   Hyperkalemia   Hyponatremia   Acute kidney injury   Frequent falls   Leucocytosis   Complete heart block   Acute pain of left shoulder   Cellulitis of arm, left   Blood poisoning   Acute pericarditis  Subjective: Without complaints No arm/hand pain  Abtx:  Anti-infectives    Start     Dose/Rate Route Frequency Ordered Stop   06/01/15 1800  cefTRIAXone (ROCEPHIN) 1 g in dextrose 5 % 50 mL IVPB     1 g 100 mL/hr over 30 Minutes Intravenous Every 24 hours 06/01/15 1638     06/01/15 1600  piperacillin-tazobactam (ZOSYN) IVPB 3.375 g  Status:  Discontinued     3.375 g 12.5 mL/hr over 240 Minutes Intravenous Every 8 hours 06/01/15 0756 06/01/15 1638   06/01/15 0915  vancomycin (VANCOCIN) IVPB 1000 mg/200 mL premix     1,000 mg 200 mL/hr over 60 Minutes Intravenous Daily 06/01/15 0908     06/01/15 0800  vancomycin (VANCOCIN) IVPB 1000 mg/200 mL premix  Status:  Discontinued     1,000 mg 200 mL/hr over 60 Minutes Intravenous STAT 06/01/15 0730 06/01/15 0908   06/01/15 0800  piperacillin-tazobactam (ZOSYN) IVPB 3.375 g     3.375 g 100 mL/hr over 30 Minutes Intravenous STAT 06/01/15 0754 06/01/15 0959   06/01/15 0745  piperacillin-tazobactam (ZOSYN) IVPB 3.375 g  Status:  Discontinued     3.375 g 100 mL/hr over 30 Minutes Intravenous  Once 06/01/15 0730 06/01/15 0753      Medications:  Scheduled: . cefTRIAXone (ROCEPHIN)  IV  1 g Intravenous Q24H  . cholecalciferol  5,000 Units Oral Daily  . ferrous sulfate  325 mg Oral Daily  . levothyroxine  50 mcg Oral QAC breakfast  . sodium chloride  500 mL Intravenous Once  . vancomycin  1,000 mg Intravenous Daily    Objective: Vital signs in last 24 hours: Temp:  [97.6 F (36.4 C)-98.7  F (37.1 C)] 97.6 F (36.4 C) (09/10 1200) Pulse Rate:  [67-80] 75 (09/10 1400) Resp:  [17-36] 30 (09/10 1400) BP: (143-189)/(56-75) 171/61 mmHg (09/10 1400) SpO2:  [96 %-97 %] 97 % (09/10 1400) Weight:  [84.4 kg (186 lb 1.1 oz)] 84.4 kg (186 lb 1.1 oz) (09/10 0400)   General appearance: alert, cooperative and no distress Resp: clear to auscultation bilaterally Cardio: regular rate and rhythm GI: normal findings: bowel sounds normal and soft, non-tender Extremities: LUE no erythema  Lab Results  Recent Labs  06/02/15 0110 06/02/15 0229 06/03/15 0803  WBC 23.8*  --  20.8*  HGB 8.6*  --  9.1*  HCT 26.7*  --  28.3*  NA  --  128* 129*  K  --  3.7 4.5  CL  --  99* 100*  CO2  --  19* 18*  BUN  --  42* 52*  CREATININE  --  1.55* 1.82*   Liver Panel No results for input(s): PROT, ALBUMIN, AST, ALT, ALKPHOS, BILITOT, BILIDIR, IBILI in the last 72 hours. Sedimentation Rate  Recent Labs  06/01/15 1515  ESRSEDRATE 106*   C-Reactive Protein  Recent Labs  06/01/15 1515  CRP 20.7*    Microbiology: Recent Results (from the past 240 hour(s))  Culture, blood (routine  x 2)     Status: None (Preliminary result)   Collection Time: 05/31/15  3:40 PM  Result Value Ref Range Status   Specimen Description BLOOD LEFT ANTECUBITAL  Final   Special Requests BOTTLES DRAWN AEROBIC AND ANAEROBIC 5CC  Final   Culture   Final    NO GROWTH 3 DAYS Performed at [facility]    Report Status PENDING  Incomplete  Culture, blood (routine x 2)     Status: None (Preliminary result)   Collection Time: 05/31/15  3:45 PM  Result Value Ref Range Status   Specimen Description BLOOD BLOOD RIGHT HAND  Final   Special Requests BOTTLES DRAWN AEROBIC AND ANAEROBIC 5CC  Final   Culture   Final    NO GROWTH 3 DAYS Performed at [facility]    Report Status PENDING  Incomplete  MRSA PCR Screening     Status: None   Collection Time: 05/31/15  6:55 PM  Result Value Ref Range Status    MRSA by PCR NEGATIVE NEGATIVE Final    Comment:        The GeneXpert MRSA Assay (FDA approved for NASAL specimens only), is one component of a comprehensive MRSA colonization surveillance program. It is not intended to diagnose MRSA infection nor to guide or monitor treatment for MRSA infections.     Studies/Results: No results found.   Assessment/Plan: Sepsis Cellulitis Pericarditis  Total days of antibiotics: 10; day 4 vanco/ceftriaxone  His Cx are (-) Wbc is increasing. However he does not look toxic.  Could consider pericardiocentesis (pt does not want) Certainly this the season for enteroviruses which can cause pericarditis. He does not have any other typical enterovirus sx (diarrhea, rash, aseptic meningitis).          Bobby Rumpf Infectious Diseases (pager) [phone] [email or web address] 06/03/2015, 3:36 PM  LOS: 3 days

## 2015-06-03 NOTE — Progress Notes (Signed)
Patient Name: Jack Schultz Date of Encounter: 06/03/2015  Primary Cardiologist New- Dr. Stanford Breed   Principal Problem:   Fever Active Problems:   Essential thrombocytosis   Essential hypertension   Vitamin D deficiency   Hypothyroidism   Symptomatic anemia   Hyperkalemia   Hyponatremia   Acute kidney injury   Frequent falls   Leucocytosis   Complete heart block   Acute pain of left shoulder   Cellulitis of arm, left   Blood poisoning    SUBJECTIVE  Daughter thinks that SOB is worse. Mild shoulder pain. Chest discomfort noted when changing positions. Weak.    CURRENT MEDS . cefTRIAXone (ROCEPHIN)  IV  1 g Intravenous Q24H  . cholecalciferol  5,000 Units Oral Daily  . ferrous sulfate  325 mg Oral Daily  . levothyroxine  50 mcg Oral QAC breakfast  . sodium chloride  500 mL Intravenous Once  . vancomycin  1,000 mg Intravenous Daily    OBJECTIVE  Filed Vitals:   06/02/15 2100 06/03/15 0000 06/03/15 0400 06/03/15 0800  BP: 163/56 189/75  143/60  Pulse: 80 73  67  Temp: 98.3 F (36.8 C) 98.7 F (37.1 C) 97.8 F (36.6 C) 97.9 F (36.6 C)  TempSrc: Oral Oral Axillary Oral  Resp: 22 36  17  Height:      Weight:   186 lb 1.1 oz (84.4 kg)   SpO2: 96% 97%  96%    Intake/Output Summary (Last 24 hours) at 06/03/15 0944 Last data filed at 06/03/15 0500  Gross per 24 hour  Intake 2036.25 ml  Output    500 ml  Net 1536.25 ml   Filed Weights   05/31/15 1710 06/03/15 0400  Weight: 171 lb 1.2 oz (77.6 kg) 186 lb 1.1 oz (84.4 kg)    PHYSICAL EXAM  General: Pleasant, NAD. Neuro: Alert and oriented X 3. Moves all extremities spontaneously. Psych: Normal affect. HEENT:  Normal  Neck: Supple without bruits or JVD. Lungs:  Resp regular and unlabored. Diminished bibasilar breath sound. Heart: RRR no s3, s4, or murmurs. Possible rub.  Abdomen: Soft, non-tender, non-distended, BS + x 4.  Extremities: No clubbing, cyanosis or edema. DP/PT/Radials 2+ and equal  bilaterally. SCD in place  Accessory Clinical Findings  CBC  Recent Labs  05/31/15 1022  06/02/15 0110 06/03/15 0803  WBC 17.6*  < > 23.8* 20.8*  NEUTROABS 15.7*  --   --   --   HGB 7.4*  < > 8.6* 9.1*  HCT 23.3*  < > 26.7* 28.3*  MCV 94.7  < > 92.1 93.1  PLT PLATELET CLUMPS NOTED ON SMEAR, COUNT APPEARS ADEQUATE  < > 436* 497*  < > = values in this interval not displayed. Basic Metabolic Panel  Recent Labs  06/02/15 0229 06/03/15 0803  NA 128* 129*  K 3.7 4.5  CL 99* 100*  CO2 19* 18*  GLUCOSE 152* 131*  BUN 42* 52*  CREATININE 1.55* 1.82*  CALCIUM 7.5* 8.0*  MG 2.1  --    Liver Function Tests  Recent Labs  05/31/15 1022  AST 49*  ALT 24  ALKPHOS 103  BILITOT 1.9*  PROT 6.2*  ALBUMIN 2.9*    Recent Labs  05/31/15 1022  LIPASE 20*   Cardiac Enzymes  Recent Labs  05/31/15 1530 06/02/15 0110 06/02/15 0822 06/02/15 1341  CKTOTAL 50  --   --   --   TROPONINI  --  0.08* 0.08* 0.08*   Thyroid Function Tests  Recent Labs  05/31/15 1650  TSH 0.841    TELE AFIB/flutter with variable response.     ECG  Atrial flutter   Echocardiogram 05/31/2014  LV EF: 55%  ------------------------------------------------------------------- Indications:   Abnormal EKG 794.31.  ------------------------------------------------------------------- History:  Risk factors: Former tobacco use. Hypertension.  ------------------------------------------------------------------- Study Conclusions  - Left ventricle: Abnromal septal motion. The cavity size was normal. Wall thickness was normal. The estimated ejection fraction was 55%. - Aortic valve: There was mild regurgitation. - Mitral valve: There was mild regurgitation. - Left atrium: The atrium was mildly dilated. - Right ventricle: The cavity size was mildly dilated. - Atrial septum: No defect or patent foramen ovale was identified. - Tricuspid valve: There was moderate regurgitation. -  Pulmonary arteries: PA peak pressure: 50 mm Hg (S). - Pericardium, extracardiac: A trivial pericardial effusion was identified.     Radiology/Studies  Dg Chest 2 View  05/31/2015   CLINICAL DATA:  Progressive, predominantly lower extremity weakness over the past year, worse in the last week. Fevers and decreased appetite.  EXAM: CHEST  2 VIEW  COMPARISON:  None.  FINDINGS: The cardiac silhouette is upper limits of normal in size. Thoracic aortic calcification is noted. There is slight coarsening of the interstitial markings bilaterally. No confluent airspace consolidation, edema, pleural effusion, or pneumothorax is identified. Curvilinear lucency projecting over the lateral left lung base likely represents a skin fold. Thoracic spondylosis is noted.  IMPRESSION: No active cardiopulmonary disease.   Electronically Signed   By: Logan Bores M.D.   On: 05/31/2015 10:38   Dg Shoulder 1v Left  05/31/2015   CLINICAL DATA:  Left shoulder pain, remote trauma  EXAM: LEFT SHOULDER - 1 VIEW  COMPARISON:  None.  FINDINGS: Severe left glenohumeral joint degenerative change. High-riding left humeral head may indicate rotator cuff tendinopathy. AC joint degenerative change.  IMPRESSION: Degenerative change as above.   Electronically Signed   By: Conchita Paris M.D.   On: 05/31/2015 21:04   Dg Wrist Complete Left  06/01/2015   CLINICAL DATA:  Ulnar-sided wrist pain extending across the posterior hand in into the index finger.  EXAM: LEFT WRIST - COMPLETE 3+ VIEW  COMPARISON:  None.  FINDINGS: No acute fracture or dislocation is identified. There is moderate to severe joint space narrowing at the first [facility] joint with subchondral sclerosis and mild osteophytosis. No lytic or blastic osseous lesion or osseous erosion is identified. Vascular calcifications are noted.  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   Electronically Signed   By: Logan Bores M.D.   On: 06/01/2015 14:54   Ct Head Wo  Contrast  06/01/2015   CLINICAL DATA:  Prior headache, now resolved. Altered mental status. History of hypertension.  EXAM: CT HEAD WITHOUT CONTRAST  TECHNIQUE: Contiguous axial images were obtained from the base of the skull through the vertex without intravenous contrast.  COMPARISON:  None.  FINDINGS: The ventricles and sulci are normal for age. No intraparenchymal hemorrhage, mass effect nor midline shift. Patchy supratentorial white matter hypodensities are less than expected for patient's age and though non-specific suggest sequelae of chronic small vessel ischemic disease. No acute large vascular territory infarcts.  No abnormal extra-axial fluid collections. Basal cisterns are patent. Moderate calcific atherosclerosis of the carotid siphons.  No skull fracture. The included ocular globes and orbital contents are non-suspicious. Small paranasal sinus mucosal retention cysts without paranasal sinus air-fluid levels. The mastoid air cells are well aerated.  IMPRESSION: Negative noncontrast CT head for  age.   Electronically Signed   By: Elon Alas M.D.   On: 06/01/2015 00:39   US Abdomen Port  06/01/2015   CLINICAL DATA:  Abdominal pain.  EXAM: ULTRASOUND PORTABLE ABDOMEN  COMPARISON:  None.  FINDINGS: Gallbladder: Limited visualization due to patient positioning. No gallstones or wall thickening visualized. No sonographic Murphy sign noted.  Common bile duct: Diameter: 3.5 mm, normal  Liver: No focal lesion identified. Within normal limits in parenchymal echogenicity.  IVC: No abnormality visualized.  Pancreas: Not visualized due to overlying bowel gas.  Spleen: Spleen length measures 13.4 cm with volume 328 mL. Circumscribed focal hyperechoic lesions are demonstrated in the spleen, largest measuring 2 cm maximal diameter and smaller measuring 1.7 cm maximal diameter. These are likely to represent hemangiomas.  Right Kidney: Length: 10.5 cm. Echogenicity within normal limits. No mass or hydronephrosis  visualized.  Left Kidney: Length: 10.9 cm. Echogenicity within normal limits. No mass or hydronephrosis visualized.  Abdominal aorta: No aneurysm visualized.  Other findings: Small bilateral pleural effusions noted.  IMPRESSION: Splenic enlargement. Focal hyperechoic lesions in the spleen likely representing hemangiomas. Small bilateral pleural effusions are noted.   Electronically Signed   By: Lucienne Capers M.D.   On: 06/01/2015 00:51   Dg Humerus Left  05/31/2015   CLINICAL DATA:  Chronic left humeral pain, remote injury  EXAM: LEFT HUMERUS - 2+ VIEW  COMPARISON:  None.  FINDINGS: There is no evidence of fracture or other focal bone lesions. Soft tissues are unremarkable. Severe left glenohumeral joint degenerative change.  IMPRESSION: No acute abnormality.   Electronically Signed   By: Conchita Paris M.D.   On: 05/31/2015 20:49    ASSESSMENT AND PLAN  81 yo male with HTN, HLD, thrombocytosis, recurrent epistaxis related to myeloproliferative disorder and ASA, hypothyroidism, iron deficiency anemia and GERD presented to ED on 05/31/2015 for weakness, poor PO intake and recurrent falls. Also noted to have leukocytosis on arrival. Initial EKG concerning for CHB although HR was 60-70s. Now afib/flutter with variable conduction and J point elevation (pericarditis).   1. Complete heart block transitioned to aflutter  - initially in what appears to be CHB although HR was 60-70s on initial EKG, atenolol stopped, now appears to be in atrial flutter with HR 70s. Self rate controlled  - TSH normal, last dose 9/6. Echo 06/01/2015 EF 55%, mild AR, mild MR, moderate TR, PA peak pressure 64mmHg. Trace effusion.   - need to monitor for fluid overload with prior aggressive hydration. If renal function continue to get worse, may need to hold vanc. EKG also shows diffusely elevated J point ST segments. Likely a degree of pericarditis.  Trop flat 0.08, no sign of ACS  2. Leukocytosis: Tmax 100.6 overnight.  3.  Anemia: Previously transfused with 2 units PRBC  4. Generalized weakness with frequent fall  5. Dehydration with AKI: Cr continue to trend up. Echo normal EF  6. Essential thrombocytosis  7. Hypotension  8. Hypothyroidism: TSH normal     No plan for possible pacemaker therapy until infection is cleared. Similarly, no plan for cardioversion or antiarrhythmics since his flutter is asymptomatic and he has had neither bradycardia or tachycardia.  Must keep in back of mind possibility of endocarditis and would consider TEE if no other source of infection is identified or if BCx are positive for a typical endocarditis organism. For now NGTD. Daughter is concerned about sedation risks given his respiratory status and these concerns are legitimate.  TTE did not show  perivalvular abscess or vegetations.  Pericarditis may explain the conduction abnormalities and atrial flutter. Not having any significant pain however. At this time with increased creat, would avoid NSAIDS or colchicine.   Finally, the conduction abnormalities may simply be age related abnormalities that were incidentally discovered during a noncardiac infectious process.  Candee Furbish, MD   06/03/2015, 9:44 AM

## 2015-06-03 NOTE — Progress Notes (Signed)
Late entry. Pt alert c/o sob of breath, with chest tightness. EKG done Junctional rhythm. Skin cold and clammy to touch, HR in 70's. [place] aware, order received will continue to monitor. Jeanie Sewer, RN 6:08 AM 06/03/2015

## 2015-06-03 NOTE — Progress Notes (Signed)
[facility] PROGRESS NOTE  KARLA VINES [id number]:[id number] DOB: 11/07/1923 DOA: 05/31/2015 PCP: [name], MD   Brief narrative  Assessment/Plan: Sepsis Etiology unclear. Chest x-ray and UA unremarkable. Sepsis pathway initiated on admission. Blood culture so far negative. 2-D echo does not comment on possible vegetations. Elevated ESR and CRP. Given diffuse ST elevation acute myopericarditis is of concern.  -X-ray of the left shoulder and wrist negative for effusion. -Appreciate ID recommendations. Antibiotic switched to Rocephin and Zosyn. Follow final blood culture.   complete heart block Continue to monitor on telemetry. Discontinued metoprolol. Heart rate maintained in the 70s-80s. Cardiology following.  2-D echo with normal EF. No sings of  possible vegetation. Has trivial pericardial effusion. . Ideally would need a pacemaker but being held given sepsis.  Elevated troponin  EKG done showed ST changes in inferior leads (? ST elevation). Troponin of 0.08. Denies any chest pain. Cycle enzymes. Possible for myopericarditis.However given his anemia will avoid high dose of aspirin and given his acute kidney injury cannot use NSAIDs or colchicine.   Hypotension Secondary to sepsis and dehydration. Holding all blood pressure medications. Monitor with IV fluids.  Acute kidney injury Possibly due to dehydration and sepsis. Avoid nephrotoxins. Monitor with IV hydration. Kidneys appear normal on [place]. Creatinine worsened this morning. Check repeat UA, urine lites and bladder scan.  Symptomatic anemia Baseline hemoglobin of around 9. Hemoglobin of 7.2 upon presentation. Received 2 units PRBC. Improved posttransfusion.. Stool for occult blood negative. TSH and B12 normal.  Generalized weakness with frequent falls Appears to be multifactorial with sepsis, anemia and dehydration. A.m. cortisol normal. Normal TSH and B12.  Left hand and shoulder pain X-rays unremarkable. Has  limited mobility of the left arm due to shoulder pain. On the discussion with his daughter at bedside patient has chronic left arm and shoulder pain and seems to have unchanged.  Abdominal pain Resolved. Abdominal ultrasound unremarkable.  Essential thrombocytosis On hydroxyurea as outpatient. (Sees Dr. [name]).on hold due to acute kidney injury. Has history of recurrent epistaxis suspected due to myeloproliferative disorder and aspirin therapy.  hypothyroidism TSH is normal. Continue Synthroid  DVT prophylaxis: SCDs  Diet: Soft   Code Status: Full code Family Communication: Discussed with  daughter at bedside Disposition Plan: Transfer to telemetry.   Consultants:  Cardiology  Infectious disease  Procedures:  CT head  Ultrasound abdomen  2-D echo   Antibiotics:  IV vancomycin since 9/7--  IV Zosyn 9/ 7-9/8  IV Rocephin 9/8  HPI/Subjective: Patient seen and examined. He had chest tightness and shortness of breath during the night.  EKG showed junctional rhythm and skin was clammy to touch without in the 70s. Denies any symptoms this morning.  Objective: Filed Vitals:   06/03/15 1000  BP: 170/64  Pulse: 69  Temp:   Resp: 26    Intake/Output Summary (Last 24 hours) at 06/03/15 1235 Last data filed at 06/03/15 0500  Gross per 24 hour  Intake 2036.25 ml  Output    500 ml  Net 1536.25 ml   Filed Weights   05/31/15 1710 06/03/15 0400  Weight: 77.6 kg (171 lb 1.2 oz) 84.4 kg (186 lb 1.1 oz)    Exam:   General:  Elderly male lying in bed in no acute distress  HEENT: Pallor present, , moist oral mucosa, supple neck  Chest: Fine bibasilar crackles. No added sounds    CVS: Normal S1 and S2, no murmurs rub or gallop  GI: Soft, nondistended, nontender, bowel sounds present  Musculoskeletal: Warm, no edema, tender to pressure over left wrist, left arm and shoulder  CNS: Alert and oriented   Data Reviewed: Basic Metabolic Panel:  Recent  Labs Lab 05/31/15 1022 06-22-15 0347 06/02/15 0229 06/03/15 0803  NA 129* 130* 128* 129*  K 5.4* 3.8 3.7 4.5  CL 97* 97* 99* 100*  CO2 22 23 19* 18*  GLUCOSE 119* 134* 152* 131*  BUN 42* 44* 42* 52*  CREATININE 1.48* 1.42* 1.55* 1.82*  CALCIUM 8.6* 8.0* 7.5* 8.0*  MG  --   --  2.1  --    Liver Function Tests:  Recent Labs Lab 05/31/15 1022  AST 49*  ALT 24  ALKPHOS 103  BILITOT 1.9*  PROT 6.2*  ALBUMIN 2.9*    Recent Labs Lab 05/31/15 1022  LIPASE 20*   No results for input(s): AMMONIA in the last 168 hours. CBC:  Recent Labs Lab 05/31/15 1022 22-Jun-2015 0347 22-Jun-2015 1515 06/02/15 0110 06/03/15 0803  WBC 17.6* 16.8*  --  23.8* 20.8*  NEUTROABS 15.7*  --   --   --   --   HGB 7.4* 7.2* 7.9* 8.6* 9.1*  HCT 23.3* 22.7* 24.8* 26.7* 28.3*  MCV 94.7 94.6  --  92.1 93.1  PLT PLATELET CLUMPS NOTED ON SMEAR, COUNT APPEARS ADEQUATE 390  --  436* 497*   Cardiac Enzymes:  Recent Labs Lab 05/31/15 1530 06/02/15 0110 06/02/15 0822 06/02/15 1341  CKTOTAL 50  --   --   --   TROPONINI  --  0.08* 0.08* 0.08*   BNP (last 3 results) No results for input(s): BNP in the last 8760 hours.  ProBNP (last 3 results) No results for input(s): PROBNP in the last 8760 hours.  CBG:  Recent Labs Lab 05/31/15 0923 22-Jun-2015 1138 06/02/15 0039 06/03/15 0243  GLUCAP 159* 170* 155* 156*    Recent Results (from the past 240 hour(s))  Culture, blood (routine x 2)     Status: None (Preliminary result)   Collection Time: 05/31/15  3:40 PM  Result Value Ref Range Status   Specimen Description BLOOD LEFT ANTECUBITAL  Final   Special Requests BOTTLES DRAWN AEROBIC AND ANAEROBIC 5CC  Final   Culture   Final    NO GROWTH 3 DAYS Performed at [facility]    Report Status PENDING  Incomplete  Culture, blood (routine x 2)     Status: None (Preliminary result)   Collection Time: 05/31/15  3:45 PM  Result Value Ref Range Status   Specimen Description BLOOD BLOOD RIGHT  HAND  Final   Special Requests BOTTLES DRAWN AEROBIC AND ANAEROBIC 5CC  Final   Culture   Final    NO GROWTH 3 DAYS Performed at [facility]    Report Status PENDING  Incomplete  MRSA PCR Screening     Status: None   Collection Time: 05/31/15  6:55 PM  Result Value Ref Range Status   MRSA by PCR NEGATIVE NEGATIVE Final    Comment:        The GeneXpert MRSA Assay (FDA approved for NASAL specimens only), is one component of a comprehensive MRSA colonization surveillance program. It is not intended to diagnose MRSA infection nor to guide or monitor treatment for MRSA infections.      Studies: Dg Wrist Complete Left  22-Jun-2015   CLINICAL DATA:  Ulnar-sided wrist pain extending across the posterior hand in into the index finger.  EXAM: LEFT WRIST - COMPLETE 3+ VIEW  COMPARISON:  None.  FINDINGS: No acute fracture or dislocation is identified. There is moderate to severe joint space narrowing at the first [facility] joint with subchondral sclerosis and mild osteophytosis. No lytic or blastic osseous lesion or osseous erosion is identified. Vascular calcifications are noted.  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   Electronically Signed   By: Logan Bores M.D.   On: 06/01/2015 14:54    Scheduled Meds: . cefTRIAXone (ROCEPHIN)  IV  1 g Intravenous Q24H  . cholecalciferol  5,000 Units Oral Daily  . ferrous sulfate  325 mg Oral Daily  . levothyroxine  50 mcg Oral QAC breakfast  . sodium chloride  500 mL Intravenous Once  . vancomycin  1,000 mg Intravenous Daily   Continuous Infusions: . sodium chloride 1,000 mL (06/03/15 0801)      Time spent: 35 minutes    Keyra Virella, [place] [facility] Pager [phone] If 7PM-7AM, please contact night-coverage at [email or web address], password [facility] 06/03/2015, 12:35 PM  LOS: 3 days

## 2015-06-03 NOTE — Evaluation (Signed)
Physical Therapy Evaluation Patient Details Name: Jack Schultz MRN: [id number] DOB: 11-Mar-1924 Today's Date: 06/03/2015   History of Present Illness  81 yo male admitted with fever, sepsis, heart block with possible need for pacemaker. Hx of HTN, bil shoulder pain (chronic)  Clinical Impression  On eval;, pt required Mod assist +2 for stand pivot from bed to recliner. Currently pt is at high risk for falls. Generalized weakness and impaired balance noted on eval. Pt c/o dizziness with sitting and standing-BP within acceptable range 150s/60s. At this time, recommend ST rehab at [facility]. Will continue to assess progress and update d/c recs as needed.    Follow Up Recommendations SNF;Supervision/Assistance - 24 hour (depending on progress)    Equipment Recommendations   (to be determined-could possibly need RW)    Recommendations for Other Services       Precautions / Restrictions Precautions Precautions: Fall Restrictions Weight Bearing Restrictions: No      Mobility  Bed Mobility Overal bed mobility: Needs Assistance Bed Mobility: Supine to Sit     Supine to sit: Min assist;HOB elevated     General bed mobility comments: Increased time. Assist for trunk to upright and stabilization.   Transfers Overall transfer level: Needs assistance Equipment used: 2 person hand held assist Transfers: Sit to/from [facility] Sit to Stand: Mod assist;+2 physical assistance;+2 safety/equipment;From elevated surface Stand pivot transfers: Mod assist;+2 physical assistance;+2 safety/equipment       General transfer comment: Assist to rise, stabilize, control descent. Moderate posterior leaning-pt bracing backs of legs against bed for support. 2 HHA for pivot to recliner-assist to shift weight anteriorly and to stabilize/support pt.   Ambulation/Gait             General Gait Details: NT-RN recommended bed to chair only today  Stairs            Wheelchair  Mobility    Modified Rankin (Stroke Patients Only)       Balance Overall balance assessment: Needs assistance         Standing balance support: Bilateral upper extremity supported;During functional activity Standing balance-Leahy Scale: Poor                               Pertinent Vitals/Pain Pain Assessment: Faces Faces Pain Scale: Hurts even more Pain Location: chronic UE pain Pain Descriptors / Indicators: Aching;Sore Pain Intervention(s): Monitored during session;Repositioned    Home Living Family/patient expects to be discharged to:: Private residence Living Arrangements: Spouse/significant other (pt takes care of wife)   Type of Home: House Home Access: Stairs to enter Entrance Stairs-Rails: None Entrance Stairs-Number of Steps: 3 Home Layout: One level Home Equipment: None      Prior Function Level of Independence: Independent               Hand Dominance        Extremity/Trunk Assessment   Upper Extremity Assessment: Generalized weakness           Lower Extremity Assessment: Generalized weakness      Cervical / Trunk Assessment: Normal  Communication   Communication: No difficulties  Cognition Arousal/Alertness: Awake/alert Behavior During Therapy: WFL for tasks assessed/performed Overall Cognitive Status: Within Functional Limits for tasks assessed                      General Comments      Exercises  Assessment/Plan    PT Assessment Patient needs continued PT services  PT Diagnosis Difficulty walking;Generalized weakness   PT Problem List Decreased strength;Decreased activity tolerance;Decreased balance;Decreased mobility;Decreased knowledge of use of DME;Pain  PT Treatment Interventions DME instruction;Gait training;Functional mobility training;Therapeutic activities;Balance training;Therapeutic exercise;Patient/family education   PT Goals (Current goals can be found in the Care Plan section)  Acute Rehab PT Goals Patient Stated Goal: to regain PLOF PT Goal Formulation: With patient Time For Goal Achievement: 06/17/15 Potential to Achieve Goals: Good    Frequency Min 3X/week   Barriers to discharge        Co-evaluation               End of Session Equipment Utilized During Treatment: Gait belt Activity Tolerance: Patient tolerated treatment well Patient left: in chair;with call bell/phone within reach;with family/visitor present           Time: 1140-1157 PT Time Calculation (min) (ACUTE ONLY): 17 min   Charges:   PT Evaluation $Initial PT Evaluation Tier I: 1 Procedure     PT G Codes:        Weston Anna, MPT Pager: [phone]

## 2015-06-04 ENCOUNTER — Inpatient Hospital Stay (HOSPITAL_COMMUNITY): Payer: Medicare Other

## 2015-06-04 LAB — CBC
HCT: 26.8 % — ABNORMAL LOW (ref 39.0–52.0)
HEMOGLOBIN: 8.4 g/dL — AB (ref 13.0–17.0)
MCH: 29.4 pg (ref 26.0–34.0)
MCHC: 31.3 g/dL (ref 30.0–36.0)
MCV: 93.7 fL (ref 78.0–100.0)
Platelets: 525 10*3/uL — ABNORMAL HIGH (ref 150–400)
RBC: 2.86 MIL/uL — AB (ref 4.22–5.81)
RDW: 23.9 % — ABNORMAL HIGH (ref 11.5–15.5)
WBC: 21.5 10*3/uL — AB (ref 4.0–10.5)

## 2015-06-04 LAB — BASIC METABOLIC PANEL
ANION GAP: 8 (ref 5–15)
BUN: 60 mg/dL — ABNORMAL HIGH (ref 6–20)
CALCIUM: 8.1 mg/dL — AB (ref 8.9–10.3)
CO2: 22 mmol/L (ref 22–32)
Chloride: 102 mmol/L (ref 101–111)
Creatinine, Ser: 1.76 mg/dL — ABNORMAL HIGH (ref 0.61–1.24)
GFR, EST AFRICAN AMERICAN: 38 mL/min — AB (ref >60)
GFR, EST NON AFRICAN AMERICAN: 33 mL/min — AB (ref >60)
Glucose, Bld: 121 mg/dL — ABNORMAL HIGH (ref 65–99)
Potassium: 4.4 mmol/L (ref 3.5–5.1)
Sodium: 132 mmol/L — ABNORMAL LOW (ref 135–145)

## 2015-06-04 LAB — VANCOMYCIN, TROUGH: VANCOMYCIN TR: 12 ug/mL (ref 10.0–20.0)

## 2015-06-04 LAB — MAGNESIUM: MAGNESIUM: 2.5 mg/dL — AB (ref 1.7–2.4)

## 2015-06-04 MED ORDER — FUROSEMIDE 20 MG PO TABS
20.0000 mg | ORAL_TABLET | Freq: Once | ORAL | Status: AC
  Administered 2015-06-04: 20 mg via ORAL
  Filled 2015-06-04: qty 1

## 2015-06-04 MED ORDER — LORAZEPAM 1 MG PO TABS
1.0000 mg | ORAL_TABLET | Freq: Once | ORAL | Status: AC
  Administered 2015-06-04: 1 mg via ORAL
  Filled 2015-06-04: qty 1

## 2015-06-04 NOTE — Progress Notes (Addendum)
Patient Name: Jack Schultz Date of Encounter: 06/04/2015  Primary Cardiologist New- Dr. Stanford Breed   Principal Problem:   Fever Active Problems:   Essential thrombocytosis   Essential hypertension   Vitamin D deficiency   Hypothyroidism   Symptomatic anemia   Hyperkalemia   Hyponatremia   Acute kidney injury   Frequent falls   Leucocytosis   Complete heart block   Acute pain of left shoulder   Cellulitis of arm, left   Blood poisoning   Acute pericarditis    SUBJECTIVE  Daughter thinks that SOB is worse. Mild shoulder pain. Chest discomfort noted when changing positions. Weak.    CURRENT MEDS . cefTRIAXone (ROCEPHIN)  IV  1 g Intravenous Q24H  . cholecalciferol  5,000 Units Oral Daily  . ferrous sulfate  325 mg Oral Daily  . levothyroxine  50 mcg Oral QAC breakfast  . sodium chloride  500 mL Intravenous Once  . vancomycin  1,000 mg Intravenous Daily    OBJECTIVE  Filed Vitals:   06/03/15 2043 06/04/15 0144 06/04/15 0151 06/04/15 0553  BP: 154/55 160/65  154/57  Pulse: 70 131 80 67  Temp: 98.1 F (36.7 C) 98.2 F (36.8 C)  97.6 F (36.4 C)  TempSrc: Oral Axillary  Oral  Resp: 22 32  22  Height:      Weight:      SpO2: 95% 97%  98%    Intake/Output Summary (Last 24 hours) at 06/04/15 0814 Last data filed at 06/04/15 0555  Gross per 24 hour  Intake    885 ml  Output    375 ml  Net    510 ml   Filed Weights   05/31/15 1710 06/03/15 0400  Weight: 171 lb 1.2 oz (77.6 kg) 186 lb 1.1 oz (84.4 kg)    PHYSICAL EXAM  General: Pleasant, NAD. Neuro: Alert and oriented X 3. Moves all extremities spontaneously. Psych: Normal affect. HEENT:  Normal  Neck: Supple without bruits or JVD. Lungs:  Resp regular and unlabored. Diminished bibasilar breath sound. Heart: RRR no s3, s4, or murmurs. Possible rub.  Abdomen: Soft, non-tender, non-distended, BS + x 4.  Extremities: No clubbing, cyanosis or edema. DP/PT/Radials 2+ and equal bilaterally. SCD in  place  Accessory Clinical Findings  CBC  Recent Labs  06/03/15 0803 06/04/15 0530  WBC 20.8* 21.5*  HGB 9.1* 8.4*  HCT 28.3* 26.8*  MCV 93.1 93.7  PLT 497* 256*   Basic Metabolic Panel  Recent Labs  06/02/15 0229 06/03/15 0803 06/04/15 0530  NA 128* 129* 132*  K 3.7 4.5 4.4  CL 99* 100* 102  CO2 19* 18* 22  GLUCOSE 152* 131* 121*  BUN 42* 52* 60*  CREATININE 1.55* 1.82* 1.76*  CALCIUM 7.5* 8.0* 8.1*  MG 2.1  --   --    Liver Function Tests No results for input(s): AST, ALT, ALKPHOS, BILITOT, PROT, ALBUMIN in the last 72 hours. No results for input(s): LIPASE, AMYLASE in the last 72 hours. Cardiac Enzymes  Recent Labs  06/02/15 0110 06/02/15 0822 06/02/15 1341  TROPONINI 0.08* 0.08* 0.08*   Thyroid Function Tests No results for input(s): TSH, T4TOTAL, T3FREE, THYROIDAB in the last 72 hours.  Invalid input(s): FREET3  TELE AFIB/flutter with variable response. PVC's noted    ECG  Atrial flutter   Echocardiogram 05/31/2014  LV EF: 55%  ------------------------------------------------------------------- Indications:   Abnormal EKG 794.31.  ------------------------------------------------------------------- History:  Risk factors: Former tobacco use. Hypertension.  ------------------------------------------------------------------- Study Conclusions  -  Left ventricle: Abnromal septal motion. The cavity size was normal. Wall thickness was normal. The estimated ejection fraction was 55%. - Aortic valve: There was mild regurgitation. - Mitral valve: There was mild regurgitation. - Left atrium: The atrium was mildly dilated. - Right ventricle: The cavity size was mildly dilated. - Atrial septum: No defect or patent foramen ovale was identified. - Tricuspid valve: There was moderate regurgitation. - Pulmonary arteries: PA peak pressure: 50 mm Hg (S). - Pericardium, extracardiac: A trivial pericardial effusion was identified.      Radiology/Studies  Dg Chest 2 View  05/31/2015   CLINICAL DATA:  Progressive, predominantly lower extremity weakness over the past year, worse in the last week. Fevers and decreased appetite.  EXAM: CHEST  2 VIEW  COMPARISON:  None.  FINDINGS: The cardiac silhouette is upper limits of normal in size. Thoracic aortic calcification is noted. There is slight coarsening of the interstitial markings bilaterally. No confluent airspace consolidation, edema, pleural effusion, or pneumothorax is identified. Curvilinear lucency projecting over the lateral left lung base likely represents a skin fold. Thoracic spondylosis is noted.  IMPRESSION: No active cardiopulmonary disease.   Electronically Signed   By: Logan Bores M.D.   On: 05/31/2015 10:38   Dg Shoulder 1v Left  05/31/2015   CLINICAL DATA:  Left shoulder pain, remote trauma  EXAM: LEFT SHOULDER - 1 VIEW  COMPARISON:  None.  FINDINGS: Severe left glenohumeral joint degenerative change. High-riding left humeral head may indicate rotator cuff tendinopathy. AC joint degenerative change.  IMPRESSION: Degenerative change as above.   Electronically Signed   By: Conchita Paris M.D.   On: 05/31/2015 21:04   Dg Wrist Complete Left  06/01/2015   CLINICAL DATA:  Ulnar-sided wrist pain extending across the posterior hand in into the index finger.  EXAM: LEFT WRIST - COMPLETE 3+ VIEW  COMPARISON:  None.  FINDINGS: No acute fracture or dislocation is identified. There is moderate to severe joint space narrowing at the first [facility] joint with subchondral sclerosis and mild osteophytosis. No lytic or blastic osseous lesion or osseous erosion is identified. Vascular calcifications are noted.  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   Electronically Signed   By: Logan Bores M.D.   On: 06/01/2015 14:54   Ct Head Wo Contrast  06/01/2015   CLINICAL DATA:  Prior headache, now resolved. Altered mental status. History of hypertension.  EXAM: CT HEAD WITHOUT  CONTRAST  TECHNIQUE: Contiguous axial images were obtained from the base of the skull through the vertex without intravenous contrast.  COMPARISON:  None.  FINDINGS: The ventricles and sulci are normal for age. No intraparenchymal hemorrhage, mass effect nor midline shift. Patchy supratentorial white matter hypodensities are less than expected for patient's age and though non-specific suggest sequelae of chronic small vessel ischemic disease. No acute large vascular territory infarcts.  No abnormal extra-axial fluid collections. Basal cisterns are patent. Moderate calcific atherosclerosis of the carotid siphons.  No skull fracture. The included ocular globes and orbital contents are non-suspicious. Small paranasal sinus mucosal retention cysts without paranasal sinus air-fluid levels. The mastoid air cells are well aerated.  IMPRESSION: Negative noncontrast CT head for age.   Electronically Signed   By: Elon Alas M.D.   On: 06/01/2015 00:39   US Abdomen Port  06/01/2015   CLINICAL DATA:  Abdominal pain.  EXAM: ULTRASOUND PORTABLE ABDOMEN  COMPARISON:  None.  FINDINGS: Gallbladder: Limited visualization due to patient positioning. No gallstones or wall thickening visualized. No  sonographic Murphy sign noted.  Common bile duct: Diameter: 3.5 mm, normal  Liver: No focal lesion identified. Within normal limits in parenchymal echogenicity.  IVC: No abnormality visualized.  Pancreas: Not visualized due to overlying bowel gas.  Spleen: Spleen length measures 13.4 cm with volume 328 mL. Circumscribed focal hyperechoic lesions are demonstrated in the spleen, largest measuring 2 cm maximal diameter and smaller measuring 1.7 cm maximal diameter. These are likely to represent hemangiomas.  Right Kidney: Length: 10.5 cm. Echogenicity within normal limits. No mass or hydronephrosis visualized.  Left Kidney: Length: 10.9 cm. Echogenicity within normal limits. No mass or hydronephrosis visualized.  Abdominal aorta: No  aneurysm visualized.  Other findings: Small bilateral pleural effusions noted.  IMPRESSION: Splenic enlargement. Focal hyperechoic lesions in the spleen likely representing hemangiomas. Small bilateral pleural effusions are noted.   Electronically Signed   By: Lucienne Capers M.D.   On: 06/01/2015 00:51   Dg Humerus Left  05/31/2015   CLINICAL DATA:  Chronic left humeral pain, remote injury  EXAM: LEFT HUMERUS - 2+ VIEW  COMPARISON:  None.  FINDINGS: There is no evidence of fracture or other focal bone lesions. Soft tissues are unremarkable. Severe left glenohumeral joint degenerative change.  IMPRESSION: No acute abnormality.   Electronically Signed   By: Conchita Paris M.D.   On: 05/31/2015 20:49    ASSESSMENT AND PLAN  81 yo male with HTN, HLD, thrombocytosis, recurrent epistaxis related to myeloproliferative disorder and ASA, hypothyroidism, iron deficiency anemia and GERD presented to ED on 05/31/2015 for weakness, poor PO intake and recurrent falls. Also noted to have leukocytosis on arrival. Initial EKG concerning for CHB although HR was 60-70s. Now afib/flutter with variable conduction and J point elevation (pericarditis).   1. Complete heart block transitioned to afib  - initially in what appears to be CHB although HR was 60-70s on initial EKG, atenolol stopped, now appears to be in atrial flutter with HR 70s. Self rate controlled  - TSH normal, last dose 9/6. Echo 06/01/2015 EF 55%, mild AR, mild MR, moderate TR, PA peak pressure 59mmHg. Trace effusion.   - need to monitor for fluid overload with prior aggressive hydration. If renal function continue to get worse, may need to hold vanc. EKG also shows diffusely elevated J point ST segments. Likely a degree of pericarditis.  Trop flat 0.08, no sign of ACS  -given his current pericarditis and marked weakness and anemia, I do not feel he is an optimal candidate for anticoagulation.   2. Leukocytosis: AFebrile overnight.  3. Anemia: Previously  transfused with 2 units PRBC  4. Generalized weakness with frequent fall. 3 week progression. Prior to this, quite active.   5. Dehydration with AKI: Cr flat. Echo normal EF  6. Essential thrombocytosis  7. Hypotension  8. Hypothyroidism: TSH normal     No plan for pacemaker therapy at this time. Similarly, no plan for cardioversion or antiarrhythmics since his flutter is asymptomatic and he has had neither bradycardia or tachycardia. ID has seen. No TEE. [place]. Fever-pericarditis. TTE did not show perivalvular abscess or vegetations.  Pericarditis may explain the conduction abnormalities and atrial flutter. Not having any significant pain however. At this time with increased creat, would avoid NSAIDS or colchicine.   Finally, the conduction abnormalities may simply be age related abnormalities that were incidentally discovered during a noncardiac infectious process.  Spoke to daughter.   Candee Furbish, MD   06/04/2015, 8:14 AM

## 2015-06-04 NOTE — Progress Notes (Signed)
Patient stated that he is used to sleeping in a large bed and wanted to get up and sit in the recliner chair. Patient is anxious. Will continue to monitor the patient.

## 2015-06-04 NOTE — Progress Notes (Signed)
ANTIBIOTIC CONSULT NOTE   Pharmacy Consult for Vancomycin Indication:   Allergies  Allergen Reactions  . Ace Inhibitors Cough    Patient Measurements: Height: 5\' 9"  (175.3 cm) Weight: 186 lb 1.1 oz (84.4 kg) IBW/kg (Calculated) : 70.7  Vital Signs: Temp: 97.6 F (36.4 C) (09/11 0553) Temp Source: Oral (09/11 0553) BP: 154/57 mmHg (09/11 0553) Pulse Rate: 67 (09/11 0553) Intake/Output from previous day: 09/10 0701 - 09/11 0700 In: 960 [P.O.:600; I.V.:160; IV Piggyback:200] Out: 550 [Urine:550]  Labs:  Recent Labs  06/02/15 0110 06/02/15 0229 06/03/15 0803 06/04/15 0530  WBC 23.8*  --  20.8* 21.5*  HGB 8.6*  --  9.1* 8.4*  PLT 436*  --  497* 525*  CREATININE  --  1.55* 1.82* 1.76*   Estimated Creatinine Clearance: 28.5 mL/min (by C-G formula based on Cr of 1.76).  Recent Labs  06/04/15 0910  [facility] 12     Microbiology: Recent Results (from the past 720 hour(s))  Culture, blood (routine x 2)     Status: None (Preliminary result)   Collection Time: 05/31/15  3:40 PM  Result Value Ref Range Status   Specimen Description BLOOD LEFT ANTECUBITAL  Final   Special Requests BOTTLES DRAWN AEROBIC AND ANAEROBIC 5CC  Final   Culture   Final    NO GROWTH 3 DAYS Performed at [facility]    Report Status PENDING  Incomplete  Culture, blood (routine x 2)     Status: None (Preliminary result)   Collection Time: 05/31/15  3:45 PM  Result Value Ref Range Status   Specimen Description BLOOD BLOOD RIGHT HAND  Final   Special Requests BOTTLES DRAWN AEROBIC AND ANAEROBIC 5CC  Final   Culture   Final    NO GROWTH 3 DAYS Performed at [facility]    Report Status PENDING  Incomplete  MRSA PCR Screening     Status: None   Collection Time: 05/31/15  6:55 PM  Result Value Ref Range Status   MRSA by PCR NEGATIVE NEGATIVE Final    Comment:        The GeneXpert MRSA Assay (FDA approved for NASAL specimens only), is one component of a comprehensive MRSA  colonization surveillance program. It is not intended to diagnose MRSA infection nor to guide or monitor treatment for MRSA infections.     Medical History: Past Medical History  Diagnosis Date  . Hypertension   . Hyperlipidemia   . Pre-diabetes   . Thyroid disease   . Thrombocytosis   . GERD (gastroesophageal reflux disease)   . Vitamin D deficiency     Assessment: 82 yoM presented to ED on 9/7 with weakness, falls, and abdominal pain.  PMH includes HTN, HLD, thrombocytosis (on Hydroxyurea), epistaxis, iron deficiency anemia, GERD.  He was recently on Keflex for cellulitis of distal left forearm, started on 9/1.  Initially, without clear source of infection, it was planned to watch him OFF antibiotics.  This morning, pharmacy is consulted to dose Vancomycin for cellulitis.  Also on ceftriaxone per ID.   9/8 >> Zosyn >> 9/8  9/8 >> Vanc >>  9/8 >> ceftriaxone >>   9/7 blood x2: NGTD   Dose changes/levels:  9/11 0930 VT = 16mcg/ml on 1gm q24h (prior to 4th dose)  Renal: Scr elevated but improved this am WBC remains elevated afebrile  Goal of Therapy:  Vancomycin trough level 10-15 mcg/ml   Appropriate abx dosing, eradication of infection.   Plan:  Day #4 antibiotics  Continue vancomycin 1gm IV q24h as trough is appropriate  Follow renal function closely  Recheck level as indicated  Doreene Eland, PharmD, BCPS.   Pager: [phone] 06/04/2015 10:24 AM

## 2015-06-04 NOTE — Progress Notes (Addendum)
[facility] PROGRESS NOTE  Jack Schultz JFH:[id number] DOB: January 08, 1924 DOA: 05/31/2015 PCP: [name], MD   Brief narrative 81 year old male with history of hypertension, hyperlipidemia, thrombocytosis (on hydroxyurea, sees Dr. [name]), recurrent epistaxis for to be related to myeloproliferative disorder and aspirin therapy hypothyroidism, iron deficiency anemia and GERD brought to the ED by family as patient has been progressively getting weak for the past 4 weeks with poor by mouth intake and recurrent falls. Patient reports having frequent falls without any loss of consciousness or sustaining any injury. He was seen by his PCP on 9/1 days ago and was found to have cellulitis of his distal left forearm and prescribed a 2 weeks course of Keflex. He reports that since then he has been having pain in his left arm and shoulder. Family also reports patient having off-and-on diffuse abdominal pain past 1 week. No associated nausea and vomiting or bowel symptoms. Patient denies headache, dizziness, fever, chills, nausea , vomiting, chest pain, palpitations, SOB, abdominal pain, bowel or urinary symptoms. Denies change in weight but has poor appetite for last 3 weeks.  Course in the ED Patient blood pressure was low normal at 94/48 mmHg O2 sat of 89% on room air. Blood will done showed significant leukocytosis with WBC of 17.6, hemoglobin of 7.4 (baseline hemoglobin of 9-10), clumped platelets. Chemistry showed sodium of 129, potassium 5.4, chloride of 97, BUN of 42 and creatinine 1.48. Glucose 119. Chest x-ray and UA were unremarkable. Hospitalist admission requested for further management   Assessment/Plan: Sepsis Chest x-ray and UA unremarkable. Sepsis pathway initiated on admission. Blood cultures negative. 2-D echo does not comment on possible vegetations. Elevated ESR and CRP. Given diffuse ST elevation with pericardia effusion on echo acute myopericarditis is of concern.   -X-ray of the left shoulder and wrist negative for effusion. -Appreciate ID recommendations. Antibiotic switched to Rocephin and Zosyn. Still has leukocytosis but remains afebrile. Final course and duration of antibiotics per ID recommendation. -fluids continued after blood pressure improved and signs of mild volume overload.   complete heart block Continue to monitor on telemetry. Discontinued metoprolol. Heart rate maintained in the 70s-80s. Cardiology following.  2-D echo with normal EF. No sings of  possible vegetation. Has trivial pericardial effusion. . Ideally would need a pacemaker but not plan for now given underlying sepsis.  Elevated troponin  EKG done showed ST changes in inferior leads (? ST elevation). Troponin of 0.08 x 3. No chest pain.  Possibly due to  myopericarditis.   Acute pericarditis/ myopericarditis - Suspicion given diffuse ST elevation, trivial pericardial effusion on echo and underlying sepsis. However given his anemia will avoid high dose of aspirin and given his acute kidney injury cannot use NSAIDs or colchicine.    Hypotension Secondary to sepsis and dehydration. Discontinued fluids  Acute kidney injury Possibly due to dehydration and sepsis. Avoid nephrotoxins. .  Repeat UA negative for infection. Bladder scanned without residual. Repeat renal US. Follow urine lites. Avoid nephrotoxins.   Symptomatic anemia Baseline hemoglobin of around 9. Hemoglobin of 7.2 upon presentation. Received 2 units PRBC. Improved posttransfusion.. Stool for occult blood negative. TSH and B12 normal.  Generalized weakness with frequent falls Appears to be multifactorial with sepsis, anemia and dehydration. A.m. cortisol normal. Normal TSH and B12. Seen by physical therapy and recommended SNF versus home with 24-hour supervision. Daughter would like to speak with Education officer, museum. Consulted.  Left hand and shoulder pain X-rays unremarkable. Has limited mobility of the left arm due  to  shoulder pain. On the discussion with his daughter at bedside patient has chronic left arm and shoulder pain and seems to have unchanged.  Abdominal pain Resolved. Abdominal ultrasound unremarkable.  Essential thrombocytosis On hydroxyurea as outpatient. (Sees Dr. [name]).on hold due to acute kidney injury. Has history of recurrent epistaxis suspected due to myeloproliferative disorder and aspirin therapy.  hypothyroidism TSH is normal. Continue Synthroid  Prolonged Qtc (530)  Normal K and magnesium. Monitor on telemetry.   DVT prophylaxis: SCDs  Diet: Regular   Code Status: Full code Family Communication: Discussed with  daughter at bedside Disposition Plan: Continue telemetry monitoring. Potential discharge in next 72 hrs.   Consultants:  Cardiology  Infectious disease  Procedures:  CT head  Ultrasound abdomen  2-D echo   Antibiotics:  IV vancomycin since 9/7--  IV Zosyn 9/ 7-9/8  IV Rocephin 9/8--  HPI/Subjective: Patient seen and examined. Denies any chest discomfort or shortness of breath. No overnight issues.  Objective: Filed Vitals:   06/04/15 0553  BP: 154/57  Pulse: 67  Temp: 97.6 F (36.4 C)  Resp: 22    Intake/Output Summary (Last 24 hours) at 06/04/15 1109 Last data filed at 06/04/15 0800  Gross per 24 hour  Intake    540 ml  Output    375 ml  Net    165 ml   Filed Weights   05/31/15 1710 06/03/15 0400  Weight: 77.6 kg (171 lb 1.2 oz) 84.4 kg (186 lb 1.1 oz)    Exam:   General:  Elderly male lying in bed in no acute distress  HEENT:  moist oral mucosa, supple neck  Chest: Fine bibasilar crackles. No added sounds    CVS: Normal S1 and S2, no murmurs rub or gallop  GI: Soft, nondistended, nontender, bowel sounds present  Musculoskeletal: Warm, no edema, tender to pressure over left wrist, left arm and shoulder (chronic)  CNS: Alert and oriented   Data Reviewed: Basic Metabolic Panel:  Recent Labs Lab  05/31/15 1022 06/01/15 0347 06/02/15 0229 06/03/15 0803 06/04/15 0530 06/04/15 0910  NA 129* 130* 128* 129* 132*  --   K 5.4* 3.8 3.7 4.5 4.4  --   CL 97* 97* 99* 100* 102  --   CO2 22 23 19* 18* 22  --   GLUCOSE 119* 134* 152* 131* 121*  --   BUN 42* 44* 42* 52* 60*  --   CREATININE 1.48* 1.42* 1.55* 1.82* 1.76*  --   CALCIUM 8.6* 8.0* 7.5* 8.0* 8.1*  --   MG  --   --  2.1  --   --  2.5*   Liver Function Tests:  Recent Labs Lab 05/31/15 1022  AST 49*  ALT 24  ALKPHOS 103  BILITOT 1.9*  PROT 6.2*  ALBUMIN 2.9*    Recent Labs Lab 05/31/15 1022  LIPASE 20*   No results for input(s): AMMONIA in the last 168 hours. CBC:  Recent Labs Lab 05/31/15 1022 06/01/15 0347 06/01/15 1515 06/02/15 0110 06/03/15 0803 06/04/15 0530  WBC 17.6* 16.8*  --  23.8* 20.8* 21.5*  NEUTROABS 15.7*  --   --   --   --   --   HGB 7.4* 7.2* 7.9* 8.6* 9.1* 8.4*  HCT 23.3* 22.7* 24.8* 26.7* 28.3* 26.8*  MCV 94.7 94.6  --  92.1 93.1 93.7  PLT PLATELET CLUMPS NOTED ON SMEAR, COUNT APPEARS ADEQUATE 390  --  436* 497* 525*   Cardiac Enzymes:  Recent Labs Lab 05/31/15 1530 06/02/15  0110 06/02/15 0822 06/02/15 1341  CKTOTAL 50  --   --   --   TROPONINI  --  0.08* 0.08* 0.08*   BNP (last 3 results) No results for input(s): BNP in the last 8760 hours.  ProBNP (last 3 results) No results for input(s): PROBNP in the last 8760 hours.  CBG:  Recent Labs Lab 05/31/15 0923 06/01/15 1138 06/02/15 0039 06/03/15 0243  GLUCAP 159* 170* 155* 156*    Recent Results (from the past 240 hour(s))  Culture, blood (routine x 2)     Status: None (Preliminary result)   Collection Time: 05/31/15  3:40 PM  Result Value Ref Range Status   Specimen Description BLOOD LEFT ANTECUBITAL  Final   Special Requests BOTTLES DRAWN AEROBIC AND ANAEROBIC 5CC  Final   Culture   Final    NO GROWTH 3 DAYS Performed at [facility]    Report Status PENDING  Incomplete  Culture, blood (routine x 2)      Status: None (Preliminary result)   Collection Time: 05/31/15  3:45 PM  Result Value Ref Range Status   Specimen Description BLOOD BLOOD RIGHT HAND  Final   Special Requests BOTTLES DRAWN AEROBIC AND ANAEROBIC 5CC  Final   Culture   Final    NO GROWTH 3 DAYS Performed at [facility]    Report Status PENDING  Incomplete  MRSA PCR Screening     Status: None   Collection Time: 05/31/15  6:55 PM  Result Value Ref Range Status   MRSA by PCR NEGATIVE NEGATIVE Final    Comment:        The GeneXpert MRSA Assay (FDA approved for NASAL specimens only), is one component of a comprehensive MRSA colonization surveillance program. It is not intended to diagnose MRSA infection nor to guide or monitor treatment for MRSA infections.      Studies: No results found.  Scheduled Meds: . cefTRIAXone (ROCEPHIN)  IV  1 g Intravenous Q24H  . cholecalciferol  5,000 Units Oral Daily  . ferrous sulfate  325 mg Oral Daily  . levothyroxine  50 mcg Oral QAC breakfast  . sodium chloride  500 mL Intravenous Once  . vancomycin  1,000 mg Intravenous Daily   Continuous Infusions:      Time spent: 25 minutes    Louellen Molder  [facility] Pager [phone] If 7PM-7AM, please contact night-coverage at [email or web address], password [facility] 06/04/2015, 11:09 AM  LOS: 4 days

## 2015-06-05 ENCOUNTER — Ambulatory Visit: Payer: Self-pay | Admitting: Internal Medicine

## 2015-06-05 ENCOUNTER — Other Ambulatory Visit: Payer: Self-pay | Admitting: Oncology

## 2015-06-05 DIAGNOSIS — I4891 Unspecified atrial fibrillation: Secondary | ICD-10-CM | POA: Diagnosis not present

## 2015-06-05 DIAGNOSIS — I481 Persistent atrial fibrillation: Secondary | ICD-10-CM

## 2015-06-05 DIAGNOSIS — I10 Essential (primary) hypertension: Secondary | ICD-10-CM

## 2015-06-05 DIAGNOSIS — E46 Unspecified protein-calorie malnutrition: Secondary | ICD-10-CM

## 2015-06-05 DIAGNOSIS — D649 Anemia, unspecified: Secondary | ICD-10-CM | POA: Insufficient documentation

## 2015-06-05 LAB — CBC WITH DIFFERENTIAL/PLATELET
BASOS ABS: 0 10*3/uL (ref 0.0–0.1)
BLASTS: 0 %
Band Neutrophils: 0 % (ref 0–10)
Basophils Relative: 0 % (ref 0–1)
EOS PCT: 1 % (ref 0–5)
Eosinophils Absolute: 0.2 10*3/uL (ref 0.0–0.7)
HEMATOCRIT: 27 % — AB (ref 39.0–52.0)
HEMOGLOBIN: 8.4 g/dL — AB (ref 13.0–17.0)
LYMPHS ABS: 1.3 10*3/uL (ref 0.7–4.0)
Lymphocytes Relative: 6 % — ABNORMAL LOW (ref 12–46)
MCH: 29.4 pg (ref 26.0–34.0)
MCHC: 31.1 g/dL (ref 30.0–36.0)
MCV: 94.4 fL (ref 78.0–100.0)
MONOS PCT: 3 % (ref 3–12)
MYELOCYTES: 1 %
Metamyelocytes Relative: 0 %
Monocytes Absolute: 0.6 10*3/uL (ref 0.1–1.0)
NEUTROS ABS: 19.4 10*3/uL — AB (ref 1.7–7.7)
NEUTROS PCT: 89 % — AB (ref 43–77)
NRBC: 0 /100{WBCs}
Other: 0 %
Platelets: 554 10*3/uL — ABNORMAL HIGH (ref 150–400)
Promyelocytes Absolute: 0 %
RBC: 2.86 MIL/uL — AB (ref 4.22–5.81)
RDW: 23.6 % — AB (ref 11.5–15.5)
WBC: 21.5 10*3/uL — AB (ref 4.0–10.5)

## 2015-06-05 LAB — CBC
HCT: 28 % — ABNORMAL LOW (ref 39.0–52.0)
HEMOGLOBIN: 8.9 g/dL — AB (ref 13.0–17.0)
MCH: 30 pg (ref 26.0–34.0)
MCHC: 31.8 g/dL (ref 30.0–36.0)
MCV: 94.3 fL (ref 78.0–100.0)
PLATELETS: 564 10*3/uL — AB (ref 150–400)
RBC: 2.97 MIL/uL — AB (ref 4.22–5.81)
RDW: 23.6 % — ABNORMAL HIGH (ref 11.5–15.5)
WBC: 21 10*3/uL — AB (ref 4.0–10.5)

## 2015-06-05 LAB — CULTURE, BLOOD (ROUTINE X 2)
CULTURE: NO GROWTH
CULTURE: NO GROWTH

## 2015-06-05 LAB — BASIC METABOLIC PANEL
ANION GAP: 6 (ref 5–15)
BUN: 55 mg/dL — ABNORMAL HIGH (ref 6–20)
CHLORIDE: 104 mmol/L (ref 101–111)
CO2: 23 mmol/L (ref 22–32)
Calcium: 8.3 mg/dL — ABNORMAL LOW (ref 8.9–10.3)
Creatinine, Ser: 1.58 mg/dL — ABNORMAL HIGH (ref 0.61–1.24)
GFR, EST AFRICAN AMERICAN: 43 mL/min — AB (ref >60)
GFR, EST NON AFRICAN AMERICAN: 37 mL/min — AB (ref >60)
Glucose, Bld: 105 mg/dL — ABNORMAL HIGH (ref 65–99)
POTASSIUM: 4.1 mmol/L (ref 3.5–5.1)
SODIUM: 133 mmol/L — AB (ref 135–145)

## 2015-06-05 LAB — LACTATE DEHYDROGENASE: LDH: 184 U/L (ref 98–192)

## 2015-06-05 LAB — SAVE SMEAR

## 2015-06-05 LAB — CREATININE, URINE, RANDOM: CREATININE, URINE: 125.75 mg/dL

## 2015-06-05 LAB — SODIUM, URINE, RANDOM: Sodium, Ur: <10 mmol/L

## 2015-06-05 MED ORDER — HALOPERIDOL LACTATE 5 MG/ML IJ SOLN
2.5000 mg | Freq: Once | INTRAMUSCULAR | Status: AC
  Administered 2015-06-05: 2.5 mg via INTRAVENOUS
  Filled 2015-06-05: qty 1

## 2015-06-05 MED ORDER — ANAGRELIDE HCL 0.5 MG PO CAPS
0.5000 mg | ORAL_CAPSULE | Freq: Every day | ORAL | Status: DC
  Administered 2015-06-05 – 2015-06-06 (×2): 0.5 mg via ORAL
  Filled 2015-06-05 (×2): qty 1

## 2015-06-05 MED ORDER — FUROSEMIDE 20 MG PO TABS
20.0000 mg | ORAL_TABLET | Freq: Once | ORAL | Status: AC
  Administered 2015-06-05: 20 mg via ORAL
  Filled 2015-06-05: qty 1

## 2015-06-05 NOTE — Progress Notes (Signed)
[facility] PROGRESS NOTE  Jack Schultz ZOX:[id number] DOB: 02-18-1924 DOA: 05/31/2015 PCP: [name], MD   Brief narrative 81 year old male with history of hypertension, hyperlipidemia, thrombocytosis (on hydroxyurea, sees Dr. [name]), recurrent epistaxis for to be related to myeloproliferative disorder and aspirin therapy hypothyroidism, iron deficiency anemia and GERD brought to the ED by family as patient has been progressively getting weak for the past 4 weeks with poor by mouth intake and recurrent falls. Patient reports having frequent falls without any loss of consciousness or sustaining any injury. He was seen by his PCP on 9/1 days ago and was found to have cellulitis of his distal left forearm and prescribed a 2 weeks course of Keflex. He reports that since then he has been having pain in his left arm and shoulder. Family also reports patient having off-and-on diffuse abdominal pain past 1 week. No associated nausea and vomiting or bowel symptoms. Patient denies headache, dizziness, fever, chills, nausea , vomiting, chest pain, palpitations, SOB, abdominal pain, bowel or urinary symptoms. Denies change in weight but has poor appetite for last 3 weeks.  Course in the ED Patient blood pressure was low normal at 94/48 mmHg O2 sat of 89% on room air. Blood will done showed significant leukocytosis with WBC of 17.6, hemoglobin of 7.4 (baseline hemoglobin of 9-10), clumped platelets. Chemistry showed sodium of 129, potassium 5.4, chloride of 97, BUN of 42 and creatinine 1.48. Glucose 119. Chest x-ray and UA were unremarkable. Hospitalist admission requested for further management   Assessment/Plan: Sepsis Chest x-ray and UA unremarkable. Sepsis pathway initiated on admission. Blood cultures negative. 2-D echo does not comment on possible vegetations. Elevated ESR and CRP. Given diffuse ST elevation with pericardia effusion on echo acute myopericarditis is of concern.   -X-ray of the left shoulder and wrist negative for effusion. -Appreciate ID recommendations. Antibiotic switched to Rocephin and Zosyn. Still has leukocytosis but remains afebrile. Final course and duration of antibiotics per ID recommendation. -Dr Megan Salon will follow up on patient today.  -I have consulted hematology due to leukocytoses and history of myeloproliferative diseases.    complete heart block Continue to monitor on telemetry. Discontinued metoprolol. Heart rate maintained in the 70s-80s. Cardiology following.  2-D echo with normal EF. No sings of  possible vegetation. Has trivial pericardial effusion. . Ideally would need a pacemaker but not plan for now given underlying sepsis.  Elevated troponin  EKG done showed ST changes in inferior leads (? ST elevation). Troponin of 0.08 x 3. No chest pain.  Possibly due to  myopericarditis.   Acute pericarditis/ myopericarditis - Suspicion given diffuse ST elevation, trivial pericardial effusion on echo and underlying sepsis. However given his anemia will avoid high dose of aspirin and given his acute kidney injury cannot use NSAIDs or colchicine.   Hypotension Secondary to sepsis and dehydration. Discontinued fluids  Acute kidney injury Possibly due to dehydration and sepsis. Avoid nephrotoxins. .  Repeat UA negative for infection. Bladder scanned without residual. Repeat renal US. Follow urine lites. Avoid nephrotoxins. Cr stable, trending down. He has crackles lung exam. Will give one time dose of lasix.  Repeat labs in am.   Symptomatic anemia Baseline hemoglobin of around 9. Hemoglobin of 7.2 upon presentation. Received 2 units PRBC. Improved posttransfusion.. Stool for occult blood negative. TSH and B12 normal.  Generalized weakness with frequent falls Appears to be multifactorial with sepsis, anemia and dehydration. A.m. cortisol normal. Normal TSH and B12. Seen by physical therapy and recommended SNF versus home  with 24-hour  supervision. Daughter would like to speak with Education officer, [employer]. Consulted.  Left hand and shoulder pain X-rays unremarkable. Has limited mobility of the left arm due to shoulder pain. On the discussion with his daughter at bedside patient has chronic left arm and shoulder pain and seems to have unchanged.  Abdominal pain Resolved. Abdominal ultrasound unremarkable. Showed splenomegaly.   Essential thrombocytosis, Leukocytosis/  On hydroxyurea as outpatient. (Sees Dr. [name]). on hold due to acute kidney injury. Has history of recurrent epistaxis suspected due to myeloproliferative disorder and aspirin therapy. Patient with persistent leukocytosis. In reviewing records, he had in peripheral smear blast Metas and myelos labs 04-25-2015. I have consulted hematology, oncology. I have order LDH, peripheral smear.   hypothyroidism TSH is normal. Continue Synthroid  Prolonged Qtc (530)  Normal K and magnesium. Monitor on telemetry.   DVT prophylaxis: SCDs  Diet: Regular   Code Status: Full code Family Communication: Discussed with  daughter at bedside Disposition Plan: Continue telemetry monitoring. Potential discharge in next 72 hrs. Awaiting ID recommendation.    Consultants:  Cardiology  Infectious disease  Procedures:  CT head  Ultrasound abdomen  2-D echo   Antibiotics:  IV vancomycin since 9/7--  IV Zosyn 9/ 7-9/8  IV Rocephin 9/8--  HPI/Subjective: He is breathing ok, denies abdominal pain. Initially on admission he had some abdominal pain.   Objective: Filed Vitals:   06/05/15 0539  BP: 129/67  Pulse: 81  Temp: 97.5 F (36.4 C)  Resp: 18    Intake/Output Summary (Last 24 hours) at 06/05/15 1020 Last data filed at 06/05/15 0540  Gross per 24 hour  Intake    550 ml  Output   1825 ml  Net  -1275 ml   Filed Weights   05/31/15 1710 06/03/15 0400  Weight: 77.6 kg (171 lb 1.2 oz) 84.4 kg (186 lb 1.1 oz)    Exam:   General:  Elderly male lying in  bed in no acute distress  HEENT:  moist oral mucosa, supple neck  Chest: bilateral  crackles. No added sounds    CVS: Normal S1 and S2, no murmurs rub or gallop  GI: Soft, nondistended, nontender, bowel sounds present  Musculoskeletal: Warm, trace  edema, tender to pressure over left wrist, left arm and shoulder (chronic)  CNS: Alert and oriented   Data Reviewed: Basic Metabolic Panel:  Recent Labs Lab 06/01/15 0347 06/02/15 0229 06/03/15 0803 06/04/15 0530 06/04/15 0910 06/05/15 0443  NA 130* 128* 129* 132*  --  133*  K 3.8 3.7 4.5 4.4  --  4.1  CL 97* 99* 100* 102  --  104  CO2 23 19* 18* 22  --  23  GLUCOSE 134* 152* 131* 121*  --  105*  BUN 44* 42* 52* 60*  --  55*  CREATININE 1.42* 1.55* 1.82* 1.76*  --  1.58*  CALCIUM 8.0* 7.5* 8.0* 8.1*  --  8.3*  MG  --  2.1  --   --  2.5*  --    Liver Function Tests:  Recent Labs Lab 05/31/15 1022  AST 49*  ALT 24  ALKPHOS 103  BILITOT 1.9*  PROT 6.2*  ALBUMIN 2.9*    Recent Labs Lab 05/31/15 1022  LIPASE 20*   No results for input(s): AMMONIA in the last 168 hours. CBC:  Recent Labs Lab 05/31/15 1022 06/01/15 0347 06/01/15 1515 06/02/15 0110 06/03/15 0803 06/04/15 0530 06/05/15 0443  WBC 17.6* 16.8*  --  23.8* 20.8* 21.5* 21.0*  NEUTROABS 15.7*  --   --   --   --   --   --   HGB 7.4* 7.2* 7.9* 8.6* 9.1* 8.4* 8.9*  HCT 23.3* 22.7* 24.8* 26.7* 28.3* 26.8* 28.0*  MCV 94.7 94.6  --  92.1 93.1 93.7 94.3  PLT PLATELET CLUMPS NOTED ON SMEAR, COUNT APPEARS ADEQUATE 390  --  436* 497* 525* 564*   Cardiac Enzymes:  Recent Labs Lab 05/31/15 1530 06/02/15 0110 06/02/15 0822 06/02/15 1341  CKTOTAL 50  --   --   --   TROPONINI  --  0.08* 0.08* 0.08*   BNP (last 3 results) No results for input(s): BNP in the last 8760 hours.  ProBNP (last 3 results) No results for input(s): PROBNP in the last 8760 hours.  CBG:  Recent Labs Lab 05/31/15 0923 06/01/15 1138 06/02/15 0039 06/03/15 0243  GLUCAP  159* 170* 155* 156*    Recent Results (from the past 240 hour(s))  Culture, blood (routine x 2)     Status: None (Preliminary result)   Collection Time: 05/31/15  3:40 PM  Result Value Ref Range Status   Specimen Description BLOOD LEFT ANTECUBITAL  Final   Special Requests BOTTLES DRAWN AEROBIC AND ANAEROBIC 5CC  Final   Culture   Final    NO GROWTH 4 DAYS Performed at [facility]    Report Status PENDING  Incomplete  Culture, blood (routine x 2)     Status: None (Preliminary result)   Collection Time: 05/31/15  3:45 PM  Result Value Ref Range Status   Specimen Description BLOOD BLOOD RIGHT HAND  Final   Special Requests BOTTLES DRAWN AEROBIC AND ANAEROBIC 5CC  Final   Culture   Final    NO GROWTH 4 DAYS Performed at [facility]    Report Status PENDING  Incomplete  MRSA PCR Screening     Status: None   Collection Time: 05/31/15  6:55 PM  Result Value Ref Range Status   MRSA by PCR NEGATIVE NEGATIVE Final    Comment:        The GeneXpert MRSA Assay (FDA approved for NASAL specimens only), is one component of a comprehensive MRSA colonization surveillance program. It is not intended to diagnose MRSA infection nor to guide or monitor treatment for MRSA infections.      Studies: US Renal  06/04/2015   CLINICAL DATA:  Acute renal failure  EXAM: RENAL / URINARY TRACT ULTRASOUND COMPLETE  COMPARISON:  05/31/2015 abdominal ultrasound  FINDINGS: Right Kidney:  Length: 10.2 cm. Echogenicity at upper limits of normal without hydronephrosis or focal mass.  Left Kidney:  Length: 10.5 cm. Echogenicity at upper limits of normal without hydronephrosis or focal mass.  Bladder:  Appears normal for degree of bladder distention.  Splenomegaly and echogenic splenic lesions most likely hemangiomas reidentified. This is not fully evaluated at this targeted exam today.  IMPRESSION: Borderline increased renal cortical echogenicity compatible with medical renal disease.    Electronically Signed   By: Conchita Paris M.D.   On: 06/04/2015 13:11    Scheduled Meds: . cefTRIAXone (ROCEPHIN)  IV  1 g Intravenous Q24H  . cholecalciferol  5,000 Units Oral Daily  . ferrous sulfate  325 mg Oral Daily  . levothyroxine  50 mcg Oral QAC breakfast  . sodium chloride  500 mL Intravenous Once  . vancomycin  1,000 mg Intravenous Daily   Continuous Infusions:      Time spent: 25 minutes    Regalado, Belkys  A  [facility] Pager [phone] 7PM-7AM, please contact night-coverage at [email or web address], password [facility] 06/05/2015, 10:20 AM  LOS: 5 days

## 2015-06-05 NOTE — Progress Notes (Signed)
Patient Profile: 81 yo male with HTN, HLD, thrombocytosis, recurrent epistaxis related to myeloproliferative disorder and ASA, hypothyroidism, iron deficiency anemia and GERD presented to ED on 05/31/2015 for weakness, poor PO intake and recurrent falls. Also noted to have leukocytosis on arrival. Initial EKG concerning for CHB although HR was 60-70s. Now afib/flutter with variable conduction and J point elevation (pericarditis).   Subjective: No complaints. Denies CP or dyspnea. No palpitations. Has been working with PT and feels he is regaining some of his strength.   Objective: Vital signs in last 24 hours: Temp:  [97.5 F (36.4 C)-98.5 F (36.9 C)] 97.5 F (36.4 C) (09/12 0539) Pulse Rate:  [79-108] 81 (09/12 0539) Resp:  [18-20] 18 (09/12 0539) BP: (127-148)/(48-68) 129/67 mmHg (09/12 0539) SpO2:  [97 %-99 %] 99 % (09/12 0539) Last BM Date: 06/02/15  Intake/Output from previous day: 09/11 0701 - 09/12 0700 In: 900 [P.O.:600; IV Piggyback:300] Out: 1825 [Urine:1825] Intake/Output this shift:    Medications Current Facility-Administered Medications  Medication Dose Route Frequency Provider Last Rate Last Dose  . acetaminophen (TYLENOL) tablet 650 mg  650 mg Oral Q6H PRN Nishant Dhungel, MD   650 mg at 06/04/15 2146   Or  . acetaminophen (TYLENOL) suppository 650 mg  650 mg Rectal Q6H PRN Nishant Dhungel, MD      . cefTRIAXone (ROCEPHIN) 1 g in dextrose 5 % 50 mL IVPB  1 g Intravenous Q24H Michel Bickers, MD   1 g at 06/04/15 1804  . cholecalciferol (VITAMIN D) tablet 5,000 Units  5,000 Units Oral Daily Nishant Dhungel, MD   5,000 Units at 06/05/15 0747  . ferrous sulfate tablet 325 mg  325 mg Oral Daily Nishant Dhungel, MD   325 mg at 06/05/15 0747  . levothyroxine (SYNTHROID, LEVOTHROID) tablet 50 mcg  50 mcg Oral QAC breakfast Nishant Dhungel, MD   50 mcg at 06/05/15 0746  . nitroGLYCERIN (NITROSTAT) SL tablet 0.4 mg  0.4 mg Sublingual Q5 min PRN Ritta Slot, NP   0.4 mg  at 06/04/15 0144  . ondansetron (ZOFRAN) tablet 4 mg  4 mg Oral Q6H PRN Nishant Dhungel, MD       Or  . ondansetron (ZOFRAN) injection 4 mg  4 mg Intravenous Q6H PRN Nishant Dhungel, MD   4 mg at 06/03/15 0306  . sodium chloride 0.9 % bolus 500 mL  500 mL Intravenous Once Wandra Mannan, MD      . vancomycin (VANCOCIN) IVPB 1000 mg/200 mL premix  1,000 mg Intravenous Daily Randa Spike, RPH   1,000 mg at 06/04/15 0946    PE: General appearance: alert, cooperative and no distress Neck: no carotid bruit and no JVD Lungs: clear to auscultation bilaterally Heart: irregularly irregular rhythm and regular rate Extremities: no LEE Pulses: 2+ and symmetric Skin: warm and dry Neurologic: Grossly normal  Lab Results:   Recent Labs  06/03/15 0803 06/04/15 0530 06/05/15 0443  WBC 20.8* 21.5* 21.0*  HGB 9.1* 8.4* 8.9*  HCT 28.3* 26.8* 28.0*  PLT 497* 525* 564*   BMET  Recent Labs  06/03/15 0803 06/04/15 0530 06/05/15 0443  NA 129* 132* 133*  K 4.5 4.4 4.1  CL 100* 102 104  CO2 18* 22 23  GLUCOSE 131* 121* 105*  BUN 52* 60* 55*  CREATININE 1.82* 1.76* 1.58*  CALCIUM 8.0* 8.1* 8.3*     Assessment/Plan  Principal Problem:   Fever Active Problems:   Essential thrombocytosis   Essential hypertension   Vitamin  D deficiency   Hypothyroidism   Symptomatic anemia   Hyperkalemia   Hyponatremia   Acute kidney injury   Frequent falls   Leucocytosis   Complete heart block   Acute pain of left shoulder   Cellulitis of arm, left   Blood poisoning   Acute pericarditis   1. Complete heart block transitioned to afib - initially in what appears to be CHB although HR was 60-70s on initial EKG, atenolol stopped, now appears to be in atrial flutter with HR 70s. Self rate controlled - TSH normal, last dose 9/6. Echo 06/01/2015 EF 55%, mild AR, mild MR, moderate TR, PA peak pressure 65mmHg. Trace effusion.   2. Pericarditis: EKG shows diffusely elevated J  point ST segments suggestive of pericarditis. His pericarditis may explain the conduction abnormalities and atrial flutter. He is not having any significant pain however. At this time with increased creat, would avoid NSAIDS or colchicine.    3. Atrial Fibrillation: rate is controlled and he is asymptomatic. Given his current pericarditis and marked weakness and anemia, he is not an optimal candidate for anticoagulation at this time.   4. Anemia: Previously transfused with 2 units PRBC. Hgb stable at 8.9   5. Generalized weakness with frequent fall: 3 week progression. Prior to this, quite active.   6. Dehydration with AKI: Scr improving, now at 1.58. Echo normal EF  7. Hypertension:  BP stable.    8. Hypothyroidism: TSH normal  9. Leukocytosis: AFebrile overnight.   LOS: 5 days      Rayhan Groleau M. Ladoris Gene 06/05/2015 9:37 AM

## 2015-06-05 NOTE — Progress Notes (Signed)
Patient ID: Jack Schultz, male   DOB: 1923/12/19, 81 y.o.   MRN: [id number]         [facility]    Date of Admission:  05/31/2015    Total days of antibiotics 12        Day 6 vancomycin        Day 6 ceftriaxone         Principal Problem:   Fever Active Problems:   Symptomatic anemia   Acute kidney injury   Frequent falls   Complete heart block   Acute pain of left shoulder   Cellulitis of arm, left   Essential thrombocytosis   Essential hypertension   Vitamin D deficiency   Hypothyroidism   Hyperkalemia   Hyponatremia   Leucocytosis   Blood poisoning   Acute pericarditis   Atrial fibrillation   . cefTRIAXone (ROCEPHIN)  IV  1 g Intravenous Q24H  . cholecalciferol  5,000 Units Oral Daily  . ferrous sulfate  325 mg Oral Daily  . levothyroxine  50 mcg Oral QAC breakfast  . vancomycin  1,000 mg Intravenous Daily    SUBJECTIVE: He states that he is feeling much better. He went on a long walk with physical therapy. He is not having any diarrhea. He denies chest pain. He states that he only has very mild residual soreness in his left hand.  Review of Systems: Pertinent items are noted in HPI.  Past Medical History  Diagnosis Date  . Hypertension   . Hyperlipidemia   . Pre-diabetes   . Thyroid disease   . Thrombocytosis   . GERD (gastroesophageal reflux disease)   . Vitamin D deficiency     Social History  Substance Use Topics  . Smoking status: Former Smoker    Quit date: 09/24/1975  . Smokeless tobacco: Current User  . Alcohol Use: No    Family History  Problem Relation Age of Onset  . Hypertension Father   . Heart disease Father    Allergies  Allergen Reactions  . Ace Inhibitors Cough    OBJECTIVE: Filed Vitals:   06/04/15 2118 06/05/15 0539 06/05/15 1323 06/05/15 1439  BP: 148/68 129/67 130/56 129/51  Pulse: 79 81 86 83  Temp: 97.5 F (36.4 C) 97.5 F (36.4 C) 98.1 F (36.7 C) 97.4 F (36.3 C)  TempSrc: Oral  Axillary Oral Oral  Resp: 20 18 18 18   Height:      Weight:      SpO2: 97% 99% 96% 97%   Body mass index is 27.46 kg/(m^2).  General: He is alert and in good spirits visiting with family Skin: Left hand and arm cellulitis has resolved Lungs: Clear Cor: Irregular S1 and S2 with no murmur or rubs heard  Lab Results Lab Results  Component Value Date   WBC 21.5* 06/05/2015   HGB 8.4* 06/05/2015   HCT 27.0* 06/05/2015   MCV 94.4 06/05/2015   PLT 554* 06/05/2015    Lab Results  Component Value Date   CREATININE 1.58* 06/05/2015   BUN 55* 06/05/2015   NA 133* 06/05/2015   K 4.1 06/05/2015   CL 104 06/05/2015   CO2 23 06/05/2015    Lab Results  Component Value Date   ALT 24 05/31/2015   AST 49* 05/31/2015   ALKPHOS 103 05/31/2015   BILITOT 1.9* 05/31/2015     Microbiology: Recent Results (from the past 240 hour(s))  Culture, blood (routine x 2)     Status: None  Collection Time: 05/31/15  3:40 PM  Result Value Ref Range Status   Specimen Description BLOOD LEFT ANTECUBITAL  Final   Special Requests BOTTLES DRAWN AEROBIC AND ANAEROBIC 5CC  Final   Culture   Final    NO GROWTH 5 DAYS Performed at [facility]    Report Status 06/05/2015 FINAL  Final  Culture, blood (routine x 2)     Status: None   Collection Time: 05/31/15  3:45 PM  Result Value Ref Range Status   Specimen Description BLOOD BLOOD RIGHT HAND  Final   Special Requests BOTTLES DRAWN AEROBIC AND ANAEROBIC 5CC  Final   Culture   Final    NO GROWTH 5 DAYS Performed at [facility]    Report Status 06/05/2015 FINAL  Final  MRSA PCR Screening     Status: None   Collection Time: 05/31/15  6:55 PM  Result Value Ref Range Status   MRSA by PCR NEGATIVE NEGATIVE Final    Comment:        The GeneXpert MRSA Assay (FDA approved for NASAL specimens only), is one component of a comprehensive MRSA colonization surveillance program. It is not intended to diagnose MRSA infection nor to guide  or monitor treatment for MRSA infections.      ASSESSMENT: The cause of his recent fever remains uncertain. He probably did have left hand and arm cellulitis but it had improved dramatically before he was admitted with fever. Admission blood cultures are negative. He may have some pericarditis but I'm not sure how that relates to the fever. He still has some leukocytosis but overall is doing much better. I do not see any other ongoing evidence of treatable infection. I will stop antibiotics now.  PLAN: 1. Discontinue vancomycin and ceftriaxone  Michel Bickers, MD [facility] for Infectious [place] [phone] pager   [phone] cell 06/05/2015, 4:11 PM

## 2015-06-05 NOTE — Clinical Social Work Placement (Signed)
   CLINICAL SOCIAL WORK PLACEMENT  NOTE  Date:  06/05/2015  Patient Details  Name: Jack Schultz MRN: [id number] Date of Birth: [birth year]  Clinical Social Work is seeking post-discharge placement for this patient at the [place] level of care (*CSW will initial, date and re-position this form in  chart as items are completed):  Yes   Patient/family provided with [facility] Work Department's list of facilities offering this level of care within the geographic area requested by the patient (or if unable, by the patient's family).  Yes   Patient/family informed of their freedom to choose among providers that offer the needed level of care, that participate in Medicare, Medicaid or managed care program needed by the patient, have an available bed and are willing to accept the patient.  Yes   Patient/family informed of [facility]'s ownership interest in [facility] and [facility], as well as of the fact that they are under no obligation to receive care at these facilities.  PASRR submitted to EDS on 06/05/15     PASRR number received on 06/05/15     Existing PASRR number confirmed on       FL2 transmitted to all facilities in geographic area requested by pt/family on 06/05/15     FL2 transmitted to all facilities within larger geographic area on       Patient informed that his/her managed care company has contracts with or will negotiate with certain facilities, including the following:            Patient/family informed of bed offers received.  Patient chooses bed at       Physician recommends and patient chooses bed at      Patient to be transferred to   on  .  Patient to be transferred to facility by       Patient family notified on   of transfer.  Name of family member notified:        PHYSICIAN Please sign FL2     Additional Comment:    _______________________________________________ Ladell Pier, LCSW 06/05/2015,  3:33 PM

## 2015-06-05 NOTE — Clinical Social Work Note (Signed)
Clinical Social Work Assessment  Patient Details  Name: Jack Schultz MRN: [id number] Date of Birth: Jun 19, 1924  Date of referral:  06/05/15               Reason for consult:  Discharge Planning                Permission sought to share information with:  Family Supports Permission granted to share information::  Yes, Verbal Permission Granted  Name::     Allena Earing  Agency::     Relationship::  son  Contact Information:     Housing/Transportation Living arrangements for the past 2 months:  Single Family Home Source of Information:  Patient, Adult Children Patient Interpreter Needed:  None Criminal Activity/Legal Involvement Pertinent to Current Situation/Hospitalization:  No - Comment as needed Significant Relationships:  Adult Children, Spouse Lives with:  Spouse Do you feel safe going back to the place where you live?  No Need for family participation in patient care:  Yes (Comment) (pt son at bedside)  Care giving concerns:  Pt admitted from home where pt lives with pt wife. PT recommending SNF.   Social Worker assessment / plan:  CSW received referral for New SNF.  CSW met with pt and pt son, Delfino Lovett at bedside. CSW introduced self and explained role. Pt reports that he lives at home with his wife. Pt states that he was able to ambulate independently at home, but was not able to walk far distances. Pt son discussed that the day before coming to the hospital that pt was not able to walk at all. CSW discussed PT recommendation for rehab at [facility] upon discharge. Pt stated that he was nervous as he had never been to rehab at SNF, but feels that it will be beneficial. Pt son expressed interest in [facility]. Pt agreeable to [facility] search.  CSW completed FL2 and initiated SNF search to [facility]. CSW contacted [facility] and notified facility of pt interest.  CSW to follow up with pt and pt family regarding SNF bed offers.  CSW to continue to follow to  provide support and assist with pt disposition needs.   Employment status:  Retired Forensic scientist:  Medicare PT Recommendations:  [place] / Referral to community resources:  [place]  Patient/Family's Response to care:  Pt alert and oriented x 4. Pt states that he is feeling better. Pt was hopeful he would be able to return home, but recognizes need for rehab at SNF for a short term in order to get stronger.  Patient/Family's Understanding of and Emotional Response to Diagnosis, Current Treatment, and Prognosis:  Pt displayed understanding about his diagnosis and treatment plan. Pt pleased that he is starting to feel better in order to begin working more with therapy.   Emotional Assessment Appearance:  Appears stated age Attitude/Demeanor/Rapport:  Other (pt appropriate) Affect (typically observed):  Appropriate Orientation:  Oriented to Self, Oriented to Place, Oriented to  Time, Oriented to Situation Alcohol / Substance use:  Not Applicable Psych involvement (Current and /or in the community):  No (Comment)  Discharge Needs  Concerns to be addressed:  Discharge Planning Concerns Readmission within the last 30 days:  No Current discharge risk:  None Barriers to Discharge:  Continued Medical Work up   Alison Murray A, LCSW 06/05/2015, 3:29 PM [phone]

## 2015-06-05 NOTE — Consult Note (Signed)
[place]  [phone]) [phone]   Requesting Provider: [facility]  Consulting Provider: Lurline Del, MD  Primary Oncologist: Izola Price. Benay Spice, MD  Patient Care Team: Unk Pinto, MD as PCP - General (Internal Medicine) Hayden Pedro, MD as Consulting Physician (Ophthalmology) Ladell Pier, MD as Consulting Physician (Oncology) Marica Otter, [place] [facility])  HOSPITAL CONSULTATION  NOTE  Reason for Consultation: Leukocytosis  HPI: Jack Schultz is a 81 year old [place],  man with a history of essential thrombocytosis, macrocytic anemia as detailed below, admitted on 9/7 via ED due to generalized weakness, poor oral intake and recurrent falls. No mucositis was reported. He denied any worsening dyspnea. He denied chest pain. Denied lower extremity swelling. Denies nausea, heartburn or change in bowel habits. Denies any dysuria. Denies abnormal skin rashes, or neuropathy. Denies any bleeding issues at this moment, such as epistaxis, hematemesis, hematuria or hematochezia.  Patient was on Hydrea 500 mg daily, which was placed on hold during admission. He was initially found to be in CHB and pericarditis, now transitioned to A fib, rate controlled, followed by cardiology He was found to have leukocytosis on presentation with a WBC of 17.6. He was afebrile. CXR negative for active disease. Cultures negative to date. He was placed on IV antibiotics. He was also anemic with a Hb 7.4 requiring 2 units of blood with good response.  His platelets on admission were normal at 390,000 However, his CBC is showing an increase in WBC to 21,000 today, and platelet increase to 564,000, likely reactive; Smear has been ordered for review to rule out any bone marrow process.  Oncology has been requested to evaluate abnormal CBC.   Oncological History 1.Essential thrombocytosis. He was hydroxyurea therapy at 500 mg daily.   2. History of a macrocytic anemia. Most  likely secondary to hydroxyurea therapy and the underlying myeloproliferative disorder.  3. History of recurrent epistaxis-  Likely related to the myeloproliferative disorder and aspirin therapy   Past Medical History  Diagnosis Date  . Hypertension   . Hyperlipidemia   . Pre-diabetes   . Thyroid disease   . Thrombocytosis   . GERD (gastroesophageal reflux disease)   . Vitamin D deficiency      MEDICATIONS:  Scheduled Meds: . cefTRIAXone (ROCEPHIN)  IV  1 g Intravenous Q24H  . cholecalciferol  5,000 Units Oral Daily  . ferrous sulfate  325 mg Oral Daily  . furosemide  20 mg Oral Once  . levothyroxine  50 mcg Oral QAC breakfast  . vancomycin  1,000 mg Intravenous Daily   Continuous Infusions:  PRN Meds:.acetaminophen **OR** acetaminophen, nitroGLYCERIN, ondansetron **OR** ondansetron (ZOFRAN) IV  ALLERGIES:  Allergies  Allergen Reactions  . Ace Inhibitors Cough    Review of systems: See HPI for significant positives, rest of ROS negative.   Family History  Problem Relation Age of Onset  . Hypertension Father   . Heart disease Father      History reviewed. No pertinent past surgical history.  Social History   Social History  . Marital Status: Married    Spouse Name: N/A  . Number of Children: N/A  . Years of Education: N/A   Occupational History  . Not on file.   Social History Main Topics  . Smoking status: Former Smoker    Quit date: 09/24/1975  . Smokeless tobacco: Current User  . Alcohol Use: No  . Drug Use: No  . Sexual Activity: No   Other Topics Concern  .  Not on file   Social History Narrative     PHYSICAL EXAMINATION:  Filed Vitals:   06/05/15 0539  BP: 129/67  Pulse: 81  Temp: 97.5 F (36.4 C)  Resp: 18      Intake/Output Summary (Last 24 hours) at 06/05/15 1120 Last data filed at 06/05/15 0540  Gross per 24 hour  Intake    550 ml  Output   1825 ml  Net  -1275 ml      GENERAL:alert, no distress and comfortable SKIN:  skin color, texture is dry, no rashes or significant lesions EYES: normal, conjunctiva are pink and non-injected, sclera clear OROPHARYNX:no exudate, no erythema and lips, buccal mucosa, and tongue normal  NECK: supple, thyroid normal size, non-tender, without nodularity LYMPH:  no palpable lymphadenopathy in the cervical, axillary or inguinal LUNGS: decreased breath sounds at the bases with normal breathing effort HEART: irregular rate & rhythm and no murmurs; rub audible; no lower extremity edema ABDOMEN: soft, non-tender and normal bowel sounds Musculoskeletal:no cyanosis of digits and no clubbing  PSYCH: alert & oriented x 3 with fluent speech NEURO: no focal motor/sensory deficits  LABORATORY/RADIOLOGY DATA:   Recent Labs Lab 05/31/15 1022 06/01/15 0347 06/01/15 1515 06/02/15 0110 06/03/15 0803 06/04/15 0530 06/05/15 0443  WBC 17.6* 16.8*  --  23.8* 20.8* 21.5* 21.0*  HGB 7.4* 7.2* 7.9* 8.6* 9.1* 8.4* 8.9*  HCT 23.3* 22.7* 24.8* 26.7* 28.3* 26.8* 28.0*  PLT PLATELET CLUMPS NOTED ON SMEAR, COUNT APPEARS ADEQUATE 390  --  436* 497* 525* 564*  MCV 94.7 94.6  --  92.1 93.1 93.7 94.3  MCH 30.1 30.0  --  29.7 29.9 29.4 30.0  MCHC 31.8 31.7  --  32.2 32.2 31.3 31.8  RDW 25.2* 23.3*  --  24.1* 24.0* 23.9* 23.6*  LYMPHSABS 0.7  --   --   --   --   --   --   MONOABS 0.9  --   --   --   --   --   --   EOSABS 0.2  --   --   --   --   --   --   BASOSABS 0.0  --   --   --   --   --   --     CMP    Recent Labs Lab 05/31/15 1022 06/01/15 0347 06/02/15 0229 06/03/15 0803 06/04/15 0530 06/04/15 0910 06/05/15 0443  NA 129* 130* 128* 129* 132*  --  133*  K 5.4* 3.8 3.7 4.5 4.4  --  4.1  CL 97* 97* 99* 100* 102  --  104  CO2 22 23 19* 18* 22  --  23  GLUCOSE 119* 134* 152* 131* 121*  --  105*  BUN 42* 44* 42* 52* 60*  --  55*  CREATININE 1.48* 1.42* 1.55* 1.82* 1.76*  --  1.58*  CALCIUM 8.6* 8.0* 7.5* 8.0* 8.1*  --  8.3*  MG  --   --  2.1  --   --  2.5*  --   AST 49*  --    --   --   --   --   --   ALT 24  --   --   --   --   --   --   ALKPHOS 103  --   --   --   --   --   --   BILITOT 1.9*  --   --   --   --   --   --  Component Value Date/Time   BILITOT 1.9* 05/31/2015 1022   BILITOT 0.75 10/20/2014 1032   BILIDIR 0.1 01/10/2015 1702   IBILI 0.6 01/10/2015 1702        Component Value Date/Time   ESRSEDRATE 106* 06/01/2015 1515     Recent Labs Lab 06/01/15 1040  INR 1.61*      Urinalysis    Component Value Date/Time   COLORURINE AMBER* 06/03/2015 1405   APPEARANCEUR CLEAR 06/03/2015 1405   LABSPEC 1.020 06/03/2015 1405   PHURINE 5.5 06/03/2015 1405   GLUCOSEU NEGATIVE 06/03/2015 1405   HGBUR NEGATIVE 06/03/2015 1405   BILIRUBINUR NEGATIVE 06/03/2015 1405   KETONESUR NEGATIVE 06/03/2015 1405   PROTEINUR 30* 06/03/2015 1405   UROBILINOGEN 1.0 06/03/2015 1405   NITRITE NEGATIVE 06/03/2015 1405   LEUKOCYTESUR NEGATIVE 06/03/2015 1405    Liver Function Tests:  Recent Labs Lab 05/31/15 1022  AST 49*  ALT 24  ALKPHOS 103  BILITOT 1.9*  PROT 6.2*  ALBUMIN 2.9*    Recent Labs Lab 05/31/15 1022  LIPASE 20*   CBG:  Recent Labs Lab 05/31/15 0923 06/01/15 1138 06/02/15 0039 06/03/15 0243  GLUCAP 159* 170* 155* 156*   Hgb A1c No results for input(s): HGBA1C in the last 72 hours.  Cardiac Enzymes:  Recent Labs Lab 05/31/15 1530 06/02/15 0110 06/02/15 0822 06/02/15 1341  CKTOTAL 50  --   --   --   TROPONINI  --  0.08* 0.08* 0.08*     Radiology Studies:  Dg Chest 2 View  05/31/2015   CLINICAL DATA:  Progressive, predominantly lower extremity weakness over the past year, worse in the last week. Fevers and decreased appetite.  EXAM: CHEST  2 VIEW  COMPARISON:  None.  FINDINGS: The cardiac silhouette is upper limits of normal in size. Thoracic aortic calcification is noted. There is slight coarsening of the interstitial markings bilaterally. No confluent airspace consolidation, edema, pleural effusion, or  pneumothorax is identified. Curvilinear lucency projecting over the lateral left lung base likely represents a skin fold. Thoracic spondylosis is noted.  IMPRESSION: No active cardiopulmonary disease.   Electronically Signed   By: Logan Bores M.D.   On: 05/31/2015 10:38   Dg Shoulder 1v Left  05/31/2015   CLINICAL DATA:  Left shoulder pain, remote trauma  EXAM: LEFT SHOULDER - 1 VIEW  COMPARISON:  None.  FINDINGS: Severe left glenohumeral joint degenerative change. High-riding left humeral head may indicate rotator cuff tendinopathy. AC joint degenerative change.  IMPRESSION: Degenerative change as above.   Electronically Signed   By: Conchita Paris M.D.   On: 05/31/2015 21:04   Dg Wrist Complete Left  06/01/2015   CLINICAL DATA:  Ulnar-sided wrist pain extending across the posterior hand in into the index finger.  EXAM: LEFT WRIST - COMPLETE 3+ VIEW  COMPARISON:  None.  FINDINGS: No acute fracture or dislocation is identified. There is moderate to severe joint space narrowing at the first [facility] joint with subchondral sclerosis and mild osteophytosis. No lytic or blastic osseous lesion or osseous erosion is identified. Vascular calcifications are noted.  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   Electronically Signed   By: Logan Bores M.D.   On: 06/01/2015 14:54   Ct Head Wo Contrast  06/01/2015   CLINICAL DATA:  Prior headache, now resolved. Altered mental status. History of hypertension.  EXAM: CT HEAD WITHOUT CONTRAST  TECHNIQUE: Contiguous axial images were obtained from the base of the skull through the vertex without intravenous contrast.  COMPARISON:  None.  FINDINGS: The ventricles and sulci are normal for age. No intraparenchymal hemorrhage, mass effect nor midline shift. Patchy supratentorial white matter hypodensities are less than expected for patient's age and though non-specific suggest sequelae of chronic small vessel ischemic disease. No acute large vascular territory  infarcts.  No abnormal extra-axial fluid collections. Basal cisterns are patent. Moderate calcific atherosclerosis of the carotid siphons.  No skull fracture. The included ocular globes and orbital contents are non-suspicious. Small paranasal sinus mucosal retention cysts without paranasal sinus air-fluid levels. The mastoid air cells are well aerated.  IMPRESSION: Negative noncontrast CT head for age.   Electronically Signed   By: Elon Alas M.D.   On: 06/01/2015 00:39   US Renal  06/04/2015   CLINICAL DATA:  Acute renal failure  EXAM: RENAL / URINARY TRACT ULTRASOUND COMPLETE  COMPARISON:  05/31/2015 abdominal ultrasound  FINDINGS: Right Kidney:  Length: 10.2 cm. Echogenicity at upper limits of normal without hydronephrosis or focal mass.  Left Kidney:  Length: 10.5 cm. Echogenicity at upper limits of normal without hydronephrosis or focal mass.  Bladder:  Appears normal for degree of bladder distention.  Splenomegaly and echogenic splenic lesions most likely hemangiomas reidentified. This is not fully evaluated at this targeted exam today.  IMPRESSION: Borderline increased renal cortical echogenicity compatible with medical renal disease.   Electronically Signed   By: Conchita Paris M.D.   On: 06/04/2015 13:11   US Abdomen Port  06/01/2015   CLINICAL DATA:  Abdominal pain.  EXAM: ULTRASOUND PORTABLE ABDOMEN  COMPARISON:  None.  FINDINGS: Gallbladder: Limited visualization due to patient positioning. No gallstones or wall thickening visualized. No sonographic Murphy sign noted.  Common bile duct: Diameter: 3.5 mm, normal  Liver: No focal lesion identified. Within normal limits in parenchymal echogenicity.  IVC: No abnormality visualized.  Pancreas: Not visualized due to overlying bowel gas.  Spleen: Spleen length measures 13.4 cm with volume 328 mL. Circumscribed focal hyperechoic lesions are demonstrated in the spleen, largest measuring 2 cm maximal diameter and smaller measuring 1.7 cm maximal  diameter. These are likely to represent hemangiomas.  Right Kidney: Length: 10.5 cm. Echogenicity within normal limits. No mass or hydronephrosis visualized.  Left Kidney: Length: 10.9 cm. Echogenicity within normal limits. No mass or hydronephrosis visualized.  Abdominal aorta: No aneurysm visualized.  Other findings: Small bilateral pleural effusions noted.  IMPRESSION: Splenic enlargement. Focal hyperechoic lesions in the spleen likely representing hemangiomas. Small bilateral pleural effusions are noted.   Electronically Signed   By: Lucienne Capers M.D.   On: 06/01/2015 00:51   Dg Humerus Left  05/31/2015   CLINICAL DATA:  Chronic left humeral pain, remote injury  EXAM: LEFT HUMERUS - 2+ VIEW  COMPARISON:  None.  FINDINGS: There is no evidence of fracture or other focal bone lesions. Soft tissues are unremarkable. Severe left glenohumeral joint degenerative change.  IMPRESSION: No acute abnormality.   Electronically Signed   By: Conchita Paris M.D.   On: 05/31/2015 20:49       ASSESSMENT AND PLAN:  Essential thrombocytosis.  He was hydroxyurea therapy at 500 mg daily.   This was discontinued upon admission on 9/7 He has a history of recurrent epistaxis ,ikely related to the myeloproliferative disorder and aspirin therapy, none during this admission  Will check a smear to rule out any bone marrow process, may need to reinitiate med while in hospital due to increasing platelet count    History of a macrocytic anemia.  Most  likely secondary to hydroxyurea therapy and the underlying myeloproliferative disorder. He received 2 units of blood on admission for a Hb 7.4 with good response Continue to monitor, transfuse for a Hb 7 or if acutely bleeding Hemoccult pending Check smear.  Leukocytosis Likely reactive due to inflammation and recent cellulitis of the distal left arm He was initially found to be in CHB and pericarditis, now transitioned to A fib, rate controlled, followed by  cardiology WBC on admission was 17.6, now 21 He is afebrile.  CXR negative for active disease. Cultures negative to date. COntinue IV antibiotics.  Malnutrition Consider Nutrition evaluation  Deconditioning Appreciate PT/OT involvement  DVT prophylaxis On SCDs  Full Code   Other medical issues as per admitting team    [place], PA-C 06/05/2015, 11:20 AM  ADDENDUM: Agree with above. In brief: Jack Schultz is an 81 y/o [place] man with a history of myeloproliferative disorder/ essential thrombocytosis, on longstanding hydrea; admitted 05/30/2015 with fever and a recent hisotry of cellulitis; treated with vancomycin and ceftriaxone, being discontinued today.  Review of the blood film this PM confirms the leukocytosis but there is no worrysome left shift.   ET seldom if ever transforms to acute leukemia. The elevated WBC count is a reflection of the underlying myeloprolifrative state plus infection/inflammation.  We may be hitting some walls with the hydrea, which affects all 3 cell ines and so contributes to the patient's anemia. He tells me he has never had anagrelide. Will give that a try to control the platelets and will follow the WBC.   He does not have an appointment w Dr Benay Spice until late [place]. I will arrange for a visit in our office 06/20/2015. Will follow with you while in house.  I personally saw this patient and performed a substantive portion of this encounter with the listed APP documented above.   Chauncey Cruel, MD Medical Oncology and Hematology [facility] [address] [place], [place] [address] Tel. [phone]    Fax. [phone]

## 2015-06-05 NOTE — Care Management Important Message (Signed)
Important Message  Patient Details  Name: Jack Schultz MRN: [id number] Date of Birth: Jul 21, 1924   Medicare Important Message Given:  [facility] notification given    Camillo Flaming 06/05/2015, 12:30 PMImportant Message  Patient Details  Name: Jack Schultz MRN: [id number] Date of Birth: 09-Mar-1924   Medicare Important Message Given:  Yes-second notification given    Camillo Flaming 06/05/2015, 12:30 PM

## 2015-06-05 NOTE — Progress Notes (Signed)
Physical Therapy Treatment Patient Details Name: Jack Schultz MRN: [id number] DOB: [birth year] Today's Date: 06/05/2015    History of Present Illness 81 yo male admitted with fever, sepsis, heart block with possible need for pacemaker. Hx of HTN, bil shoulder pain (chronic)    PT Comments    Pt was OOB in recliner son in room.  Assisted with amb a great distance in hallway with a walker and + 2 assist for safety.  Limited activity tolerance with noted 2/4 DOE.  One long rest break required. Pt will need ST Rehab at SNF to regain prior level of mobility.   Follow Up Recommendations  SNF ([facility] if possible per son request/location)     Equipment Recommendations       Recommendations for Other Services       Precautions / Restrictions Precautions Precautions: Fall Restrictions Weight Bearing Restrictions: No    Mobility  Bed Mobility               General bed mobility comments: Pt OOB in recliner  Transfers Overall transfer level: Needs assistance Equipment used: Rolling walker (2 wheeled) Transfers: Sit to/from Stand Sit to Stand: Min assist;Mod assist;+2 safety/equipment         General transfer comment: 25% VC's on proper hand placement and assist to rise.    Ambulation/Gait Ambulation/Gait assistance: Min assist;+2 safety/equipment Ambulation Distance (Feet): 150 Feet (75 feet x 2 sitting rest break) Assistive device: Rolling walker (2 wheeled) Gait Pattern/deviations: Step-to pattern;Drifts right/left;Trunk flexed Gait velocity: decreased   General Gait Details: + 2 for safety such that recliner was following.  ,25% VC's on upright posture and proper walker to self distance.   Stairs            Wheelchair Mobility    Modified Rankin (Stroke Patients Only)       Balance                                    Cognition Arousal/Alertness: Awake/alert Behavior During Therapy: WFL for tasks assessed/performed Overall  Cognitive Status: Within Functional Limits for tasks assessed                      Exercises      General Comments        Pertinent Vitals/Pain Pain Assessment: No/denies pain Pain Location: aches and pains (general) Pain Descriptors / Indicators: Aching Pain Intervention(s): Monitored during session;Repositioned    Home Living                      Prior Function            PT Goals (current goals can now be found in the care plan section) Progress towards PT goals: Progressing toward goals    Frequency  Min 3X/week    PT Plan      Co-evaluation             End of Session Equipment Utilized During Treatment: Gait belt Activity Tolerance: Patient tolerated treatment well Patient left: in chair;with call bell/phone within reach;with family/visitor present;with chair alarm set     Time: 1455-1525 PT Time Calculation (min) (ACUTE ONLY): 30 min  Charges:  $Gait Training: 8-22 mins $Therapeutic Activity: 8-22 mins                    G Codes:  Rica Koyanagi  PTA [facility]  Acute  Rehab Pager      [phone]

## 2015-06-06 ENCOUNTER — Inpatient Hospital Stay (HOSPITAL_COMMUNITY): Payer: Medicare Other

## 2015-06-06 ENCOUNTER — Telehealth: Payer: Self-pay | Admitting: *Deleted

## 2015-06-06 ENCOUNTER — Ambulatory Visit (HOSPITAL_COMMUNITY): Payer: Medicare Other

## 2015-06-06 ENCOUNTER — Telehealth: Payer: Self-pay | Admitting: Oncology

## 2015-06-06 DIAGNOSIS — M7989 Other specified soft tissue disorders: Secondary | ICD-10-CM

## 2015-06-06 DIAGNOSIS — R410 Disorientation, unspecified: Secondary | ICD-10-CM

## 2015-06-06 DIAGNOSIS — R21 Rash and other nonspecific skin eruption: Secondary | ICD-10-CM

## 2015-06-06 LAB — BASIC METABOLIC PANEL
Anion gap: 8 (ref 5–15)
BUN: 42 mg/dL — ABNORMAL HIGH (ref 6–20)
CHLORIDE: 103 mmol/L (ref 101–111)
CO2: 24 mmol/L (ref 22–32)
CREATININE: 1.32 mg/dL — AB (ref 0.61–1.24)
Calcium: 8.6 mg/dL — ABNORMAL LOW (ref 8.9–10.3)
GFR calc non Af Amer: 46 mL/min — ABNORMAL LOW (ref >60)
GFR, EST AFRICAN AMERICAN: 53 mL/min — AB (ref >60)
GLUCOSE: 120 mg/dL — AB (ref 65–99)
Potassium: 4.4 mmol/L (ref 3.5–5.1)
Sodium: 135 mmol/L (ref 135–145)

## 2015-06-06 LAB — CBC
HEMATOCRIT: 28.8 % — AB (ref 39.0–52.0)
HEMOGLOBIN: 9 g/dL — AB (ref 13.0–17.0)
MCH: 29.5 pg (ref 26.0–34.0)
MCHC: 31.3 g/dL (ref 30.0–36.0)
MCV: 94.4 fL (ref 78.0–100.0)
Platelets: 670 10*3/uL — ABNORMAL HIGH (ref 150–400)
RBC: 3.05 MIL/uL — ABNORMAL LOW (ref 4.22–5.81)
RDW: 23.3 % — AB (ref 11.5–15.5)
WBC: 28.2 10*3/uL — ABNORMAL HIGH (ref 4.0–10.5)

## 2015-06-06 LAB — AMMONIA: AMMONIA: 17 umol/L (ref 9–35)

## 2015-06-06 LAB — URINALYSIS, ROUTINE W REFLEX MICROSCOPIC
BILIRUBIN URINE: NEGATIVE
Glucose, UA: NEGATIVE mg/dL
HGB URINE DIPSTICK: NEGATIVE
Ketones, ur: NEGATIVE mg/dL
Leukocytes, UA: NEGATIVE
NITRITE: NEGATIVE
PROTEIN: NEGATIVE mg/dL
Specific Gravity, Urine: 1.013 (ref 1.005–1.030)
UROBILINOGEN UA: 0.2 mg/dL (ref 0.0–1.0)
pH: 5.5 (ref 5.0–8.0)

## 2015-06-06 LAB — BRAIN NATRIURETIC PEPTIDE: B NATRIURETIC PEPTIDE 5: 425.4 pg/mL — AB (ref 0.0–100.0)

## 2015-06-06 LAB — MAGNESIUM: Magnesium: 2.1 mg/dL (ref 1.7–2.4)

## 2015-06-06 LAB — GLUCOSE, CAPILLARY: GLUCOSE-CAPILLARY: 130 mg/dL — AB (ref 65–99)

## 2015-06-06 MED ORDER — FUROSEMIDE 20 MG PO TABS
20.0000 mg | ORAL_TABLET | Freq: Once | ORAL | Status: AC
  Administered 2015-06-06: 20 mg via ORAL
  Filled 2015-06-06: qty 1

## 2015-06-06 MED ORDER — LORAZEPAM 2 MG/ML IJ SOLN
0.5000 mg | Freq: Once | INTRAMUSCULAR | Status: AC
  Administered 2015-06-06: 0.5 mg via INTRAVENOUS
  Filled 2015-06-06: qty 1

## 2015-06-06 NOTE — Progress Notes (Signed)
Patient ID: Jack Schultz, male   DOB: May 25, 1924, 81 y.o.   MRN: [id number]         [facility]    Date of Admission:  05/31/2015   antibiotics were stopped this morning  Principal Problem:   Fever Active Problems:   Symptomatic anemia   Acute kidney injury   Frequent falls   Complete heart block   Acute pain of left shoulder   Cellulitis of arm, left   Essential thrombocytosis   Essential hypertension   Vitamin D deficiency   Hypothyroidism   Hyperkalemia   Hyponatremia   Leucocytosis   Blood poisoning   Acute pericarditis   Atrial fibrillation   Absolute anemia   . cholecalciferol  5,000 Units Oral Daily  . ferrous sulfate  325 mg Oral Daily  . levothyroxine  50 mcg Oral QAC breakfast    SUBJECTIVE: He has been bothered by cramps in his right foot. He has a long history of recurrent cramps. He is having more left hand pain and swelling. He has a chronic cough that is unchanged over the past month. He is not having any abdominal pain, nausea, vomiting, diarrhea or dysuria. His daughter-in-law notes that over the past month he has had recurrent issues with fever and weakness that will come and go. He had some low-grade fever this morning.  Review of Systems: Pertinent items are noted in HPI.  Past Medical History  Diagnosis Date  . Hypertension   . Hyperlipidemia   . Pre-diabetes   . Thyroid disease   . Thrombocytosis   . GERD (gastroesophageal reflux disease)   . Vitamin D deficiency     Social History  Substance Use Topics  . Smoking status: Former Smoker    Quit date: 09/24/1975  . Smokeless tobacco: Current User  . Alcohol Use: No    Family History  Problem Relation Age of Onset  . Hypertension Father   . Heart disease Father    Allergies  Allergen Reactions  . Ace Inhibitors Cough    OBJECTIVE: Filed Vitals:   06/06/15 1046 06/06/15 1115 06/06/15 1145 06/06/15 1326  BP:  117/51  123/58  Pulse:    93  Temp:  100.9 F (38.3 C)  99.5 F (37.5 C) 98.2 F (36.8 C)  TempSrc: Rectal  Rectal Oral  Resp:    16  Height:      Weight:      SpO2:    97%   Body mass index is 25.87 kg/(m^2).  General: He appears uncomfortable due to cramps and left hand pain Skin: Maculopapular rash on back which his nurse says is significantly improved from earlier today Lungs: Clear anteriorly Cor: Irregular S1 and S2 with no murmur heard Abdomen: Soft and nontender Joints and extremities: Increased swelling with mild erythema over the dorsum of his left hand. Pain with palpation and range of motion that he estimates to be 9 out of 10  Lab Results Lab Results  Component Value Date   WBC 28.2* 06/06/2015   HGB 9.0* 06/06/2015   HCT 28.8* 06/06/2015   MCV 94.4 06/06/2015   PLT 670* 06/06/2015    Lab Results  Component Value Date   CREATININE 1.32* 06/06/2015   BUN 42* 06/06/2015   NA 135 06/06/2015   K 4.4 06/06/2015   CL 103 06/06/2015   CO2 24 06/06/2015    Lab Results  Component Value Date   ALT 24 05/31/2015   AST 49* 05/31/2015  ALKPHOS 103 05/31/2015   BILITOT 1.9* 05/31/2015     Microbiology: Recent Results (from the past 240 hour(s))  Culture, blood (routine x 2)     Status: None   Collection Time: 05/31/15  3:40 PM  Result Value Ref Range Status   Specimen Description BLOOD LEFT ANTECUBITAL  Final   Special Requests BOTTLES DRAWN AEROBIC AND ANAEROBIC 5CC  Final   Culture   Final    NO GROWTH 5 DAYS Performed at [facility]    Report Status 06/05/2015 FINAL  Final  Culture, blood (routine x 2)     Status: None   Collection Time: 05/31/15  3:45 PM  Result Value Ref Range Status   Specimen Description BLOOD BLOOD RIGHT HAND  Final   Special Requests BOTTLES DRAWN AEROBIC AND ANAEROBIC 5CC  Final   Culture   Final    NO GROWTH 5 DAYS Performed at [facility]    Report Status 06/05/2015 FINAL  Final  MRSA PCR Screening     Status: None   Collection Time:  05/31/15  6:55 PM  Result Value Ref Range Status   MRSA by PCR NEGATIVE NEGATIVE Final    Comment:        The GeneXpert MRSA Assay (FDA approved for NASAL specimens only), is one component of a comprehensive MRSA colonization surveillance program. It is not intended to diagnose MRSA infection nor to guide or monitor treatment for MRSA infections.      ASSESSMENT: The cause of his recurrent fever and increasing leukocytosis remains unclear. His left hand is more swollen and tender but this occurred while he still had vancomycin and ceftriaxone at therapeutic levels making infection relatively less likely. I'm concerned about the possibility of gout. He also developed a rash today that is now fading. It is only on his back so it may just be heat rash but it's also possible that he could be having fever and rash has an adverse reaction to one of his medications. His Agrylin has been stopped. I favor observation off of antibiotics at least overnight.  PLAN: 1. Continue observation off of antibiotics for now  Michel Bickers, MD [facility] [phone] pager   [phone] cell 06/06/2015, 4:41 PM

## 2015-06-06 NOTE — Telephone Encounter (Signed)
VM left at 1459 by Katie RN on 4th floor stating post administration of anagrelide pt has developed a rash on his back.  Return call number given as [phone].  Informed MD who requested anagrelide to be discontinued.  This RN called and informed of above with  Joellen Jersey who  verbalized understanding.

## 2015-06-06 NOTE — Progress Notes (Addendum)
[facility] PROGRESS NOTE  Jack Schultz SWN:[id number] DOB: March 03, 1924 DOA: 05/31/2015 PCP: [name], MD   Brief narrative 81 year old male with history of hypertension, hyperlipidemia, thrombocytosis (on hydroxyurea, sees Dr. [name]), recurrent epistaxis for to be related to myeloproliferative disorder and aspirin therapy hypothyroidism, iron deficiency anemia and GERD brought to the ED by family as patient has been progressively getting weak for the past 4 weeks with poor by mouth intake and recurrent falls. Patient reports having frequent falls without any loss of consciousness or sustaining any injury. He was seen by his PCP on 9/1 days ago and was found to have cellulitis of his distal left forearm and prescribed a 2 weeks course of Keflex. He reports that since then he has been having pain in his left arm and shoulder. Family also reports patient having off-and-on diffuse abdominal pain past 1 week. No associated nausea and vomiting or bowel symptoms. Patient denies headache, dizziness, fever, chills, nausea , vomiting, chest pain, palpitations, SOB, abdominal pain, bowel or urinary symptoms. Denies change in weight but has poor appetite for last 3 weeks.  Course in the ED Patient blood pressure was low normal at 94/48 mmHg O2 sat of 89% on room air. Blood will done showed significant leukocytosis with WBC of 17.6, hemoglobin of 7.4 (baseline hemoglobin of 9-10), clumped platelets. Chemistry showed sodium of 129, potassium 5.4, chloride of 97, BUN of 42 and creatinine 1.48. Glucose 119. Chest x-ray and UA were unremarkable. Hospitalist admission requested for further management   Assessment/Plan: Sepsis Chest x-ray and UA unremarkable. Sepsis pathway initiated on admission. Blood cultures negative. 2-D echo does not comment on possible vegetations. Elevated ESR and CRP. Given diffuse ST elevation with pericardia effusion on echo acute myopericarditis is of concern.   -X-ray of the left shoulder and wrist negative for effusion. -Appreciate ID recommendations.  -Antibiotics discontinue 9-12. Monitor for fever.   Confusion;  Patient was agitated overnight. He received ativan and haldol at 2 am and 4 am.  Neuro exam non focal. He is confused. Will check Ct head, UA, check for fever.  Will also check chest x ray/.   Respiratory failure; Hypoxemia:  Received a dose of lasix 9-12. Will repeat another dose today.  Check Chest x ray.   Essential thrombocytosis, Leukocytosis/  -On hydroxyurea as outpatient. (Sees Dr. [name]). on hold due to acute kidney injury. Has history of recurrent epistaxis suspected due to myeloproliferative disorder and aspirin therapy. -Patient with persistent leukocytosis. In reviewing records, he had in peripheral smear blast Metas and myelos labs 04-25-2015.  -Consulted hematology due to leukocytoses and history of myeloproliferative diseases.  -LDH 184, peripheral smear review by hematology no significant left shift.  -started on Agrylin 9-12. -follow WBC trend.   Complete heart block Continue to monitor on telemetry. Discontinued metoprolol. Heart rate maintained in the 70s-80s. Cardiology following.  2-D echo with normal EF. No sings of  possible vegetation. Has trivial pericardial effusion. Ideally would need a pacemaker but not plan for now given underlying sepsis.  Elevated troponin  EKG done showed ST changes in inferior leads (? ST elevation). Troponin of 0.08 x 3. No chest pain.  Possibly due to  myopericarditis.   Acute pericarditis/ myopericarditis - Suspicion given diffuse ST elevation, trivial pericardial effusion on echo and underlying sepsis. However given his anemia will avoid high dose of aspirin and given his acute kidney injury cannot use NSAIDs or colchicine. -denies chest pain.    Hypotension Secondary to sepsis and dehydration. Discontinued  fluids  Acute kidney injury Possibly due to dehydration and  sepsis. Avoid nephrotoxins. .  Repeat UA negative for infection. Bladder scanned without residual. Repeat renal US. Follow urine lites. Avoid nephrotoxins. Cr stable, trending down. Received one dose of lasix 9-12.  Symptomatic anemia Baseline hemoglobin of around 9. Hemoglobin of 7.2 upon presentation. Received 2 units PRBC. Improved posttransfusion.. Stool for occult blood negative. TSH and B12 normal.  Generalized weakness with frequent falls Appears to be multifactorial with sepsis, anemia and dehydration. A.m. cortisol normal. Normal TSH and B12. Seen by physical therapy and recommended SNF versus home with 24-hour supervision. Daughter would like to speak with Education officer, museum. Consulted.  Left hand and shoulder pain X-rays unremarkable. Has limited mobility of the left arm due to shoulder pain. On the discussion with his daughter at bedside patient has chronic left arm and shoulder pain and seems to have unchanged.  Abdominal pain Resolved. Abdominal ultrasound unremarkable. Showed splenomegaly.   hypothyroidism TSH is normal. Continue Synthroid  Prolonged Qtc (530)  Normal K and magnesium. Monitor on telemetry.   DVT prophylaxis: SCDs  Diet: Regular   Code Status: Full code Family Communication: Discussed with  daughter at bedside Disposition Plan: Continue telemetry monitoring. Potential discharge in next 72 hrs. Awaiting ID recommendation.    Consultants:  Cardiology  Infectious disease  Procedures:  CT head  Ultrasound abdomen  2-D echo   Antibiotics:  IV vancomycin since 9/7--  IV Zosyn 9/ 7-9/8  IV Rocephin 9/8--  HPI/Subjective: He is sitting in the chair. He was able to tell me he is in the hospital. He told the cardiology PA that he was mowing his loan.  He was agitated overnight. He denies chest pain or dyspnea. He is able to follow commands.    Objective: Filed Vitals:   06/06/15 0435  BP: 133/60  Pulse: 100  Temp: 97.6 F (36.4 C)   Resp:     Intake/Output Summary (Last 24 hours) at 06/06/15 0938 Last data filed at 06/06/15 6045  Gross per 24 hour  Intake   1390 ml  Output   2550 ml  Net  -1160 ml   Filed Weights   05/31/15 1710 06/03/15 0400 06/06/15 0435  Weight: 77.6 kg (171 lb 1.2 oz) 84.4 kg (186 lb 1.1 oz) 79.5 kg (175 lb 4.3 oz)    Exam:   General:  Elderly male lying in bed in no acute distress  HEENT:  moist oral mucosa, supple neck  Chest: bilateral  crackles.    CVS: Normal S1 and S2, no murmurs rub or gallop  GI: Soft, nondistended, nontender, bowel sounds present  Musculoskeletal: Warm, trace  edema, tender to pressure over left wrist, left arm and shoulder (chronic)  CNS: Alert , confused, follows command, has chronic weakness, inability to move left arm. Rest of motor strength  non focal.    Data Reviewed: Basic Metabolic Panel:  Recent Labs Lab 06/02/15 0229 06/03/15 0803 06/04/15 0530 06/04/15 0910 06/05/15 0443 06/06/15 0512  NA 128* 129* 132*  --  133* 135  K 3.7 4.5 4.4  --  4.1 4.4  CL 99* 100* 102  --  104 103  CO2 19* 18* 22  --  23 24  GLUCOSE 152* 131* 121*  --  105* 120*  BUN 42* 52* 60*  --  55* 42*  CREATININE 1.55* 1.82* 1.76*  --  1.58* 1.32*  CALCIUM 7.5* 8.0* 8.1*  --  8.3* 8.6*  MG 2.1  --   --  2.5*  --   --    Liver Function Tests:  Recent Labs Lab 05/31/15 1022  AST 49*  ALT 24  ALKPHOS 103  BILITOT 1.9*  PROT 6.2*  ALBUMIN 2.9*    Recent Labs Lab 05/31/15 1022  LIPASE 20*   No results for input(s): AMMONIA in the last 168 hours. CBC:  Recent Labs Lab 05/31/15 1022  06/03/15 0803 06/04/15 0530 06/05/15 0443 06/05/15 1141 06/06/15 0512  WBC 17.6*  < > 20.8* 21.5* 21.0* 21.5* 28.2*  NEUTROABS 15.7*  --   --   --   --  19.4*  --   HGB 7.4*  < > 9.1* 8.4* 8.9* 8.4* 9.0*  HCT 23.3*  < > 28.3* 26.8* 28.0* 27.0* 28.8*  MCV 94.7  < > 93.1 93.7 94.3 94.4 94.4  PLT PLATELET CLUMPS NOTED ON SMEAR, COUNT APPEARS ADEQUATE  < > 497*  525* 564* 554* 670*  < > = values in this interval not displayed. Cardiac Enzymes:  Recent Labs Lab 05/31/15 1530 06/02/15 0110 06/02/15 0822 06/02/15 1341  CKTOTAL 50  --   --   --   TROPONINI  --  0.08* 0.08* 0.08*   BNP (last 3 results) No results for input(s): BNP in the last 8760 hours.  ProBNP (last 3 results) No results for input(s): PROBNP in the last 8760 hours.  CBG:  Recent Labs Lab 05/31/15 0923 06/01/15 1138 06/02/15 0039 06/03/15 0243  GLUCAP 159* 170* 155* 156*    Recent Results (from the past 240 hour(s))  Culture, blood (routine x 2)     Status: None   Collection Time: 05/31/15  3:40 PM  Result Value Ref Range Status   Specimen Description BLOOD LEFT ANTECUBITAL  Final   Special Requests BOTTLES DRAWN AEROBIC AND ANAEROBIC 5CC  Final   Culture   Final    NO GROWTH 5 DAYS Performed at [facility]    Report Status 06/05/2015 FINAL  Final  Culture, blood (routine x 2)     Status: None   Collection Time: 05/31/15  3:45 PM  Result Value Ref Range Status   Specimen Description BLOOD BLOOD RIGHT HAND  Final   Special Requests BOTTLES DRAWN AEROBIC AND ANAEROBIC 5CC  Final   Culture   Final    NO GROWTH 5 DAYS Performed at [facility]    Report Status 06/05/2015 FINAL  Final  MRSA PCR Screening     Status: None   Collection Time: 05/31/15  6:55 PM  Result Value Ref Range Status   MRSA by PCR NEGATIVE NEGATIVE Final    Comment:        The GeneXpert MRSA Assay (FDA approved for NASAL specimens only), is one component of a comprehensive MRSA colonization surveillance program. It is not intended to diagnose MRSA infection nor to guide or monitor treatment for MRSA infections.      Studies: US Renal  06/04/2015   CLINICAL DATA:  Acute renal failure  EXAM: RENAL / URINARY TRACT ULTRASOUND COMPLETE  COMPARISON:  05/31/2015 abdominal ultrasound  FINDINGS: Right Kidney:  Length: 10.2 cm. Echogenicity at upper limits of normal  without hydronephrosis or focal mass.  Left Kidney:  Length: 10.5 cm. Echogenicity at upper limits of normal without hydronephrosis or focal mass.  Bladder:  Appears normal for degree of bladder distention.  Splenomegaly and echogenic splenic lesions most likely hemangiomas reidentified. This is not fully evaluated at this targeted exam today.  IMPRESSION: Borderline increased renal cortical echogenicity  compatible with medical renal disease.   Electronically Signed   By: Conchita Paris M.D.   On: 06/04/2015 13:11    Scheduled Meds: . anagrelide  0.5 mg Oral Daily  . cholecalciferol  5,000 Units Oral Daily  . ferrous sulfate  325 mg Oral Daily  . furosemide  20 mg Oral Once  . levothyroxine  50 mcg Oral QAC breakfast   Continuous Infusions:      Time spent: 25 minutes    Juanna Pudlo A  [facility] Pager [phone] If 7PM-7AM, please contact night-coverage at [email or web address], password [facility] 06/06/2015, 9:38 AM  LOS: 6 days    Patient more sleepy, respond to questions, confused. Follows command. Tylenol for fever.  I have consulted neurology.

## 2015-06-06 NOTE — Progress Notes (Signed)
Patient Profile: 81 yo male with HTN, HLD, thrombocytosis, recurrent epistaxis related to myeloproliferative disorder and ASA, hypothyroidism, iron deficiency anemia and GERD presented to ED on 05/31/2015 for weakness, poor PO intake and recurrent falls. Also noted to have leukocytosis on arrival. Initial EKG concerning for CHB although HR was 60-70s. Now afib/flutter with variable conduction and J point elevation (pericarditis).   Subjective: Clearly more disoriented today. Thinks he was out mowing his yard earlier this morning. His daughter-in-law is by his bedside. He also appears a bit more SOB today but is currently off supplemental O2. He is in no distress. He denies any pain.   Objective: Vital signs in last 24 hours: Temp:  [97.4 F (36.3 C)-98.2 F (36.8 C)] 97.6 F (36.4 C) (09/13 0435) Pulse Rate:  [83-100] 100 (09/13 0435) Resp:  [18] 18 (09/12 2157) BP: (126-133)/(50-60) 133/60 mmHg (09/13 0435) SpO2:  [92 %-97 %] 92 % (09/13 0435) Weight:  [175 lb 4.3 oz (79.5 kg)] 175 lb 4.3 oz (79.5 kg) (09/13 0435) Last BM Date: 06/02/15  Intake/Output from previous day: 09/12 0701 - 09/13 0700 In: 1270 [P.O.:1020; IV Piggyback:250] Out: 2550 [Urine:2550] Intake/Output this shift:    Medications Current Facility-Administered Medications  Medication Dose Route Frequency Provider Last Rate Last Dose  . acetaminophen (TYLENOL) tablet 650 mg  650 mg Oral Q6H PRN Nishant Dhungel, MD   650 mg at 06/05/15 2121   Or  . acetaminophen (TYLENOL) suppository 650 mg  650 mg Rectal Q6H PRN Nishant Dhungel, MD      . anagrelide (AGRYLIN) capsule 0.5 mg  0.5 mg Oral Daily Chauncey Cruel, MD   0.5 mg at 06/05/15 2058  . cholecalciferol (VITAMIN D) tablet 5,000 Units  5,000 Units Oral Daily Nishant Dhungel, MD   5,000 Units at 06/05/15 0747  . ferrous sulfate tablet 325 mg  325 mg Oral Daily Nishant Dhungel, MD   325 mg at 06/05/15 0747  . levothyroxine (SYNTHROID, LEVOTHROID) tablet 50 mcg   50 mcg Oral QAC breakfast Nishant Dhungel, MD   50 mcg at 06/05/15 0746  . nitroGLYCERIN (NITROSTAT) SL tablet 0.4 mg  0.4 mg Sublingual Q5 min PRN Ritta Slot, NP   0.4 mg at 06/04/15 0144  . ondansetron (ZOFRAN) tablet 4 mg  4 mg Oral Q6H PRN Nishant Dhungel, MD       Or  . ondansetron (ZOFRAN) injection 4 mg  4 mg Intravenous Q6H PRN Nishant Dhungel, MD   4 mg at 06/03/15 0306    PE: General appearance: alert, cooperative and no distress Neck: no carotid bruit and no JVD Lungs: clear to auscultation bilaterally Heart: irregularly irregular rhythm and regular rate Extremities: no LEE Pulses: 2+ and symmetric Skin: warm and dry Neurologic: Grossly normal  Lab Results:   Recent Labs  06/05/15 0443 06/05/15 1141 06/06/15 0512  WBC 21.0* 21.5* 28.2*  HGB 8.9* 8.4* 9.0*  HCT 28.0* 27.0* 28.8*  PLT 564* 554* 670*   BMET  Recent Labs  06/04/15 0530 06/05/15 0443 06/06/15 0512  NA 132* 133* 135  K 4.4 4.1 4.4  CL 102 104 103  CO2 22 23 24   GLUCOSE 121* 105* 120*  BUN 60* 55* 42*  CREATININE 1.76* 1.58* 1.32*  CALCIUM 8.1* 8.3* 8.6*     Assessment/Plan  Principal Problem:   Fever Active Problems:   Essential thrombocytosis   Essential hypertension   Vitamin D deficiency   Hypothyroidism   Symptomatic anemia   Hyperkalemia  Hyponatremia   Acute kidney injury   Frequent falls   Leucocytosis   Complete heart block   Acute pain of left shoulder   Cellulitis of arm, left   Blood poisoning   Acute pericarditis   Atrial fibrillation   Absolute anemia   1. Complete heart block transitioned to afib - initially in what appears to be CHB although HR was 60-70s on initial EKG, atenolol stopped, now appears to be in atrial flutter with HR 70s. Heart rate controlled - TSH normal, last dose 9/6. Echo 06/01/2015 EF 55%, mild AR, mild MR, moderate TR, PA peak pressure 64mmHg. Trace effusion.   2. Pericarditis: EKG shows diffusely elevated J  point ST segments suggestive of pericarditis. His pericarditis may explain the conduction abnormalities and atrial flutter. He is not having any significant pain however. At this time with increased creat, would avoid NSAIDS or colchicine.    3. Atrial Fibrillation: rate is controlled and he is asymptomatic. Given his current pericarditis and marked weakness and anemia, he is not an optimal candidate for anticoagulation at this time.   4. Anemia: Previously transfused with 2 units PRBC. Hgb stable at 9.0  5. Generalized weakness with frequent fall: 3 week progression. Prior to this, quite active.   6. Dehydration with AKI: Scr improving, now at 1.32 (1.58 yesterday). Echo normal EF  7. Hypertension:  BP stable.    8. Hypothyroidism: TSH normal  9. Leukocytosis: AFebrile overnight.  10. Prolonged QT: Haldol stopped. Would avoid QT prolonging drugs.  12. SOB: he was weaned off supplemental O2 yesterday (was on 5L ). He is more SOB this am. With faint crackles at the bases. Give 1 x dose of lasix. Recheck O2 sats and place on supplemental O2 if hypoxic.   13. Confusion: disoriented today but no agitation. Family member is by bedside. IM to follow.    LOS: 6 days      Gor Vestal M. Rosita Fire, PA-C 06/06/2015 9:02 AM

## 2015-06-06 NOTE — Progress Notes (Signed)
CSW continuing to follow.   CSW spoke with MD who reports that pt had medically change/decline and confused this morning.  CSW received phone call from [facility] and CSW updated [facility].  CSW to continue to follow to provide support and assist with pt disposition needs.  Alison Murray, MSW, [place] [facility] [phone]

## 2015-06-06 NOTE — Evaluation (Signed)
SLP Cancellation Note  Patient Details Name: Jack Schultz MRN: [id number] DOB: 12/30/1923   Cancelled treatment:       Reason Eval/Treat Not Completed: Fatigue/lethargy limiting ability to participate  Luanna Salk, [facility] SLP [phone]

## 2015-06-06 NOTE — Progress Notes (Signed)
PT Cancellation Note Patient Details Name: KENDARIOUS GUDINO MRN: [id number] DOB: 08-15-1924   Cancelled Treatment:     significant medical change/decline.  Pt verbally unresponsive this morning.  Will check back tomorrow as schedule permits.   Nathanial Rancher 06/06/2015, 11:13 AM

## 2015-06-06 NOTE — Progress Notes (Signed)
Per report from MRI- Unable to complete MRI because patient c/o pain and unable to breathe due to lying flat. MD made aware. Will continue to monitor.

## 2015-06-06 NOTE — Consult Note (Signed)
Referring Physician: Dr Tyrell Antonio    Chief Complaint: confusion, agitation, left face droopiness.  HPI:                                                                                                                                         Jack Schultz is an 81 y.o. male, right handed, with a past medical history that is pertinent for HTN, hyperlipidemia, pre-diabetes, thrombocytosis, recurrent epistaxis related to myeloproliferative disorder and aspirin therapy, hypothyroidism, iron deficiency anemia and GERD, admitted to the [facility] due to sepsis, suspicion for acute pericarditis/ myopericarditis, and also at some point during this hospitalization noted atrial fibrillation rate controlled. Patient daughter is at the bedside and said that at baseline her father is quite functional and never before exhibited confusion or agitated behavior. However, she thinks that today he is more confused, thinking that he is at home, was agitated last night, and noted to have some left face droopiness. Presently, he is awake, disoriented only to place, following commands properly. Denies HA, vertigo, double vision, difficulty swallowing, focal weakness, slurred speech, language or vision impairment. CT head upon admission showed no acute abnormality. Serologies personally reviewed and significant for wbc>28.2, platelets 670, Cr 1.32, UA negative for infection.   Date last known well: unable to determine Time last known well: unable to dete4rmine tPA Given: no, unknown last seen normal   Past Medical History  Diagnosis Date  . Hypertension   . Hyperlipidemia   . Pre-diabetes   . Thyroid disease   . Thrombocytosis   . GERD (gastroesophageal reflux disease)   . Vitamin D deficiency     History reviewed. No pertinent past surgical history.  Family History  Problem Relation Age of Onset  . Hypertension Father   . Heart disease Father    Social History:  reports that he quit smoking about 39 years ago.  He uses smokeless tobacco. He reports that he does not drink alcohol or use illicit drugs. Family history: no MS, epilepsy, brain tumor, or brain aneurysm Allergies:  Allergies  Allergen Reactions  . Ace Inhibitors Cough    Medications:                                                                                                                           Scheduled: . anagrelide  0.5 mg Oral Daily  . cholecalciferol  5,000 Units Oral Daily  . ferrous sulfate  325 mg Oral Daily  . levothyroxine  50 mcg Oral QAC breakfast    ROS:                                                                                                                                       History obtained from daughter, chart review and the patient  General ROS: negative for - night sweats, weight gain or weight loss Psychological ROS: negative for - behavioral disorder, hallucinations, memory difficulties, mood swings or suicidal ideation Ophthalmic ROS: negative for - blurry vision, double vision, eye pain or loss of vision ENT ROS: negative for - epistaxis, nasal discharge, oral lesions, sore throat, tinnitus or vertigo Allergy and Immunology ROS: negative for - hives or itchy/watery eyes Hematological and Lymphatic ROS: negative for - bleeding problems, bruising or swollen lymph nodes Endocrine ROS: negative for - galactorrhea, hair pattern changes, polydipsia/polyuria or temperature intolerance Respiratory ROS: negative for - cough, hemoptysis, shortness of breath or wheezing Cardiovascular ROS: negative for - chest pain, dyspnea on exertion, edema or irregular heartbeat Gastrointestinal ROS: negative for - abdominal pain, diarrhea, hematemesis, nausea/vomiting or stool incontinence Genito-Urinary ROS: negative for - dysuria, hematuria, incontinence or urinary frequency/urgency Musculoskeletal ROS: negative for - joint swelling or muscular weakness Neurological ROS: as noted in HPI Dermatological ROS:  negative for rash    Physical exam:  Constitutional: well developed, pleasant male in no apparent distress. Blood pressure 117/51, pulse 100, temperature 100.9 F (38.3 C), temperature source Rectal, resp. rate 18, height _0  (1.753 m), weight 79.5 kg (175 lb 4.3 oz), SpO2 95 %. Eyes: no jaundice or exophthalmos.  Head: normocephalic. Neck: supple, no bruits, no JVD. Cardiac: no murmurs. Lungs: clear. Abdomen: soft, no tender, no mass. Extremities: Warm, trace edema, tender to pressure over left wrist, left arm and shoulder (chronic), no clubbing, or cyanosis.  Skin: no rash   Neurologic Examination:                                                                                                      General: Mental Status: Alert, disoriented to place, thought content appropriate.  Speech fluent without evidence of aphasia.  Able to follow 3 step commands without difficulty. Cranial Nerves: II: Discs flat bilaterally; Visual fields grossly normal, pupils equal, round, reactive to light and accommodation III,IV, VI: ptosis not present, extra-ocular motions intact bilaterally V,VII: questionable left face droop, facial light touch sensation normal bilaterally  VIII: hearing normal bilaterally IX,X: uvula rises symmetrically XI: bilateral shoulder shrug XII: midline tongue extension without atrophy or fasciculations  Motor: Chronic inability to move left arm, but for the most part I can not appreciate frank muscle weakness.  Tone and bulk:normal tone throughout; no atrophy noted Sensory: Pinprick and light touch intact throughout, bilaterally Deep Tendon Reflexes:  Right: Upper Extremity   Left: Upper extremity   biceps (C-5 to C-6) 2/4   biceps (C-5 to C-6) 2/4 tricep (C7) 2/4    triceps (C7) 2/4 Brachioradialis (C6) 2/4  Brachioradialis (C6) 2/4  Lower Extremity Lower Extremity  quadriceps (L-2 to L-4) 2/4   quadriceps (L-2 to L-4) 2/4 Achilles (S1) 2/4   Achilles (S1)  2/4  Plantars: Right: downgoing   Left: downgoing Cerebellar: normal finger-to-nose in the right but can not perform in the left due to chronic weakness,  heel-to-shin no tested Gait:  Unable to test due to multiple leads, safety concerns.    Results for orders placed or performed during the hospital encounter of 05/31/15 (from the past 48 hour(s))  CBC     Status: Abnormal   Collection Time: 06/05/15  4:43 AM  Result Value Ref Range   WBC 21.0 (H) 4.0 - 10.5 K/uL   RBC 2.97 (L) 4.22 - 5.81 MIL/uL   Hemoglobin 8.9 (L) 13.0 - 17.0 g/dL   HCT 28.0 (L) 39.0 - 52.0 %   MCV 94.3 78.0 - 100.0 fL   MCH 30.0 26.0 - 34.0 pg   MCHC 31.8 30.0 - 36.0 g/dL   RDW 23.6 (H) 11.5 - 15.5 %   Platelets 564 (H) 150 - 400 K/uL  Basic metabolic panel     Status: Abnormal   Collection Time: 06/05/15  4:43 AM  Result Value Ref Range   Sodium 133 (L) 135 - 145 mmol/L   Potassium 4.1 3.5 - 5.1 mmol/L   Chloride 104 101 - 111 mmol/L   CO2 23 22 - 32 mmol/L   Glucose, Bld 105 (H) 65 - 99 mg/dL   BUN 55 (H) 6 - 20 mg/dL   Creatinine, Ser 1.58 (H) 0.61 - 1.24 mg/dL   Calcium 8.3 (L) 8.9 - 10.3 mg/dL   GFR calc non Af Amer 37 (L) >60 mL/min   GFR calc Af Amer 43 (L) >60 mL/min    Comment: (NOTE) The eGFR has been calculated using the CKD EPI equation. This calculation has not been validated in all clinical situations. eGFR's persistently <60 mL/min signify possible Chronic Kidney Disease.    Anion gap 6 5 - 15  CBC with Differential/Platelet     Status: Abnormal   Collection Time: 06/05/15 11:41 AM  Result Value Ref Range   WBC 21.5 (H) 4.0 - 10.5 K/uL   RBC 2.86 (L) 4.22 - 5.81 MIL/uL   Hemoglobin 8.4 (L) 13.0 - 17.0 g/dL   HCT 27.0 (L) 39.0 - 52.0 %   MCV 94.4 78.0 - 100.0 fL   MCH 29.4 26.0 - 34.0 pg   MCHC 31.1 30.0 - 36.0 g/dL   RDW 23.6 (H) 11.5 - 15.5 %   Platelets 554 (H) 150 - 400 K/uL   Neutrophils Relative % 89 (H) 43 - 77 %   Lymphocytes Relative 6 (L) 12 - 46 %   Monocytes  Relative 3 3 - 12 %   Eosinophils Relative 1 0 - 5 %   Basophils Relative 0 0 - 1 %   Band Neutrophils 0 0 - 10 %  Metamyelocytes Relative 0 %   Myelocytes 1 %   Promyelocytes Absolute 0 %   Blasts 0 %   nRBC 0 0 /100 WBC   Other 0 %   Neutro Abs 19.4 (H) 1.7 - 7.7 K/uL   Lymphs Abs 1.3 0.7 - 4.0 K/uL   Monocytes Absolute 0.6 0.1 - 1.0 K/uL   Eosinophils Absolute 0.2 0.0 - 0.7 K/uL   Basophils Absolute 0.0 0.0 - 0.1 K/uL   RBC Morphology POLYCHROMASIA PRESENT     Comment: ELLIPTOCYTES ACANTHOCYTES    Smear Review LARGE PLATELETS PRESENT   Save smear     Status: None   Collection Time: 06/05/15 11:41 AM  Result Value Ref Range   Smear Review SMEAR STAINED AND AVAILABLE FOR REVIEW   Lactate dehydrogenase     Status: None   Collection Time: 06/05/15 11:41 AM  Result Value Ref Range   LDH 184 98 - 192 U/L  CBC     Status: Abnormal   Collection Time: 06/06/15  5:12 AM  Result Value Ref Range   WBC 28.2 (H) 4.0 - 10.5 K/uL   RBC 3.05 (L) 4.22 - 5.81 MIL/uL   Hemoglobin 9.0 (L) 13.0 - 17.0 g/dL   HCT 28.8 (L) 39.0 - 52.0 %   MCV 94.4 78.0 - 100.0 fL   MCH 29.5 26.0 - 34.0 pg   MCHC 31.3 30.0 - 36.0 g/dL   RDW 23.3 (H) 11.5 - 15.5 %   Platelets 670 (H) 150 - 400 K/uL  Basic metabolic panel     Status: Abnormal   Collection Time: 06/06/15  5:12 AM  Result Value Ref Range   Sodium 135 135 - 145 mmol/L   Potassium 4.4 3.5 - 5.1 mmol/L   Chloride 103 101 - 111 mmol/L   CO2 24 22 - 32 mmol/L   Glucose, Bld 120 (H) 65 - 99 mg/dL   BUN 42 (H) 6 - 20 mg/dL   Creatinine, Ser 1.32 (H) 0.61 - 1.24 mg/dL   Calcium 8.6 (L) 8.9 - 10.3 mg/dL   GFR calc non Af Amer 46 (L) >60 mL/min   GFR calc Af Amer 53 (L) >60 mL/min    Comment: (NOTE) The eGFR has been calculated using the CKD EPI equation. This calculation has not been validated in all clinical situations. eGFR's persistently <60 mL/min signify possible Chronic Kidney Disease.    Anion gap 8 5 - 15  Urinalysis, Routine w  reflex microscopic (not at [facility])     Status: Abnormal   Collection Time: 06/06/15  9:56 AM  Result Value Ref Range   Color, Urine YELLOW YELLOW   APPearance CLOUDY (A) CLEAR   Specific Gravity, Urine 1.013 1.005 - 1.030   pH 5.5 5.0 - 8.0   Glucose, UA NEGATIVE NEGATIVE mg/dL   Hgb urine dipstick NEGATIVE NEGATIVE   Bilirubin Urine NEGATIVE NEGATIVE   Ketones, ur NEGATIVE NEGATIVE mg/dL   Protein, ur NEGATIVE NEGATIVE mg/dL   Urobilinogen, UA 0.2 0.0 - 1.0 mg/dL   Nitrite NEGATIVE NEGATIVE   Leukocytes, UA NEGATIVE NEGATIVE    Comment: MICROSCOPIC NOT DONE ON URINES WITH NEGATIVE PROTEIN, BLOOD, LEUKOCYTES, NITRITE, OR GLUCOSE <1000 mg/dL.   US Renal  06/04/2015   CLINICAL DATA:  Acute renal failure  EXAM: RENAL / URINARY TRACT ULTRASOUND COMPLETE  COMPARISON:  05/31/2015 abdominal ultrasound  FINDINGS: Right Kidney:  Length: 10.2 cm. Echogenicity at upper limits of normal without hydronephrosis or focal mass.  Left Kidney:  Length: 10.5  cm. Echogenicity at upper limits of normal without hydronephrosis or focal mass.  Bladder:  Appears normal for degree of bladder distention.  Splenomegaly and echogenic splenic lesions most likely hemangiomas reidentified. This is not fully evaluated at this targeted exam today.  IMPRESSION: Borderline increased renal cortical echogenicity compatible with medical renal disease.   Electronically Signed   By: Conchita Paris M.D.   On: 06/04/2015 13:11    Assessment: 81 y.o. male admitted to [facility] with sepsis, suspicion for acute pericarditis/ myopericarditis, at some point noted to be on atrial fib rate controlled, reportedly confused and agitated last night and today. Further, staff noticed some left face droopiness. Except for questionable subtle weakness left nasolabial folding and disorientation to place, patient has not other evidence of abnormal findings on neuro-exam. Although the overall picture seems more suggestive of acute a septic encephalopathy, the  reported atrial fibrillation in association with altered mental status and questionable new finding of subtle left face droop warrants further investigation with MRI brain to ruled out stroke. Additionally, will obtain EEG to address degree of encephalopathy. Will follow up.  Dorian Pod, MD [facility] [phone]  06/06/2015, 11:36 AM

## 2015-06-06 NOTE — Telephone Encounter (Signed)
Called patient re appointment for 9/27. Per wife patient in hosp and asked that I call room [phone]. Called room and spoke with relative re appointment and mailed schedule.

## 2015-06-07 ENCOUNTER — Inpatient Hospital Stay (HOSPITAL_COMMUNITY)
Admit: 2015-06-07 | Discharge: 2015-06-07 | Disposition: A | Discharge status: Home / Self Care | Payer: Medicare Other | Attending: Neurology | Admitting: Neurology

## 2015-06-07 DIAGNOSIS — J90 Pleural effusion, not elsewhere classified: Secondary | ICD-10-CM

## 2015-06-07 DIAGNOSIS — F05 Delirium due to known physiological condition: Secondary | ICD-10-CM | POA: Insufficient documentation

## 2015-06-07 LAB — BASIC METABOLIC PANEL
Anion gap: 8 (ref 5–15)
BUN: 35 mg/dL — ABNORMAL HIGH (ref 6–20)
CALCIUM: 8.3 mg/dL — AB (ref 8.9–10.3)
CO2: 24 mmol/L (ref 22–32)
CREATININE: 1.48 mg/dL — AB (ref 0.61–1.24)
Chloride: 102 mmol/L (ref 101–111)
GFR calc non Af Amer: 40 mL/min — ABNORMAL LOW (ref >60)
GFR, EST AFRICAN AMERICAN: 47 mL/min — AB (ref >60)
Glucose, Bld: 98 mg/dL (ref 65–99)
Potassium: 3.8 mmol/L (ref 3.5–5.1)
SODIUM: 134 mmol/L — AB (ref 135–145)

## 2015-06-07 LAB — CBC
HCT: 26.2 % — ABNORMAL LOW (ref 39.0–52.0)
Hemoglobin: 8.1 g/dL — ABNORMAL LOW (ref 13.0–17.0)
MCH: 28.8 pg (ref 26.0–34.0)
MCHC: 30.9 g/dL (ref 30.0–36.0)
MCV: 93.2 fL (ref 78.0–100.0)
PLATELETS: 600 10*3/uL — AB (ref 150–400)
RBC: 2.81 MIL/uL — ABNORMAL LOW (ref 4.22–5.81)
RDW: 23.2 % — AB (ref 11.5–15.5)
WBC: 37.5 10*3/uL — ABNORMAL HIGH (ref 4.0–10.5)

## 2015-06-07 LAB — URIC ACID: URIC ACID, SERUM: 7.2 mg/dL (ref 4.4–7.6)

## 2015-06-07 MED ORDER — DEXTROSE 5 % IV SOLN
1.0000 g | Freq: Two times a day (BID) | INTRAVENOUS | Status: DC
  Administered 2015-06-07 – 2015-06-09 (×5): 1 g via INTRAVENOUS
  Filled 2015-06-07 (×5): qty 1

## 2015-06-07 MED ORDER — ENSURE ENLIVE PO LIQD
237.0000 mL | Freq: Two times a day (BID) | ORAL | Status: DC
  Administered 2015-06-07 – 2015-06-11 (×7): 237 mL via ORAL

## 2015-06-07 MED ORDER — HYDROXYUREA 500 MG PO CAPS
1000.0000 mg | ORAL_CAPSULE | Freq: Every day | ORAL | Status: DC
  Administered 2015-06-07 – 2015-06-10 (×4): 1000 mg via ORAL
  Filled 2015-06-07 (×4): qty 2

## 2015-06-07 MED ORDER — FUROSEMIDE 10 MG/ML IJ SOLN
40.0000 mg | Freq: Once | INTRAMUSCULAR | Status: AC
  Administered 2015-06-07: 40 mg via INTRAVENOUS
  Filled 2015-06-07: qty 4

## 2015-06-07 NOTE — Progress Notes (Signed)
Offsite EEG completed, results pending. 

## 2015-06-07 NOTE — Progress Notes (Signed)
Physical Therapy Treatment Patient Details Name: Jack Schultz MRN: [id number] DOB: [birth year] Today's Date: 06/07/2015    History of Present Illness 81 yo male admitted with fever, sepsis, heart block with possible need for pacemaker. Hx of HTN, bil shoulder pain (chronic).  Pt with confusion, agitation, left facial droop 9/13, unable to tolerate MRI, neurology following.    PT Comments    Pt requiring increased time and effort to perform mobility.  Pt reports some weakness on L side, poor L grip compared to R as well as observed 2+/5 strength in L LE as pt unable to perform full AROM. Pt also presented with buckling of L LE in weight bearing while standing.  Pt assisted with performing sit to stands a few times then assisted back to bed.   Follow Up Recommendations  SNF     Equipment Recommendations  Rolling walker with 5" wheels    Recommendations for Other Services       Precautions / Restrictions Precautions Precautions: Fall    Mobility  Bed Mobility Overal bed mobility: Needs Assistance Bed Mobility: Supine to Sit     Supine to sit: Min assist;HOB elevated     General bed mobility comments: Increased time. Assist for trunk to upright and stabilization.   Transfers Overall transfer level: Needs assistance Equipment used: Rolling walker (2 wheeled) Transfers: Sit to/from Stand Sit to Stand: From elevated surface;Mod assist         General transfer comment: Assist to rise, stabilize, and control descent. Moderate posterior leaning-pt bracing backs of legs against bed for support requiring multidmodal cues to weight shift and reaching COG. pt unable to march in place so performed sit to stands x3 then had pt slide/scoot feet over to move up HOB, L LE buckling with weight bearing so pt encouraged to push down on RW which required max cues   Ambulation/Gait Ambulation/Gait assistance:  (NT for safety, need +2)               Stairs             Wheelchair Mobility    Modified Rankin (Stroke Patients Only)       Balance Overall balance assessment: Needs assistance         Standing balance support: Bilateral upper extremity supported Standing balance-Leahy Scale: Poor Standing balance comment: requires UE support and mod assist with challenges, unable to perform SLS on L LE, L LE buckling with weight bearing                    Cognition Arousal/Alertness: Awake/alert Behavior During Therapy: WFL for tasks assessed/performed Overall Cognitive Status: Within Functional Limits for tasks assessed                      Exercises      General Comments        Pertinent Vitals/Pain Pain Assessment: Faces Faces Pain Scale: Hurts even more Pain Location: L LE with standing Pain Descriptors / Indicators: Aching;Sore Pain Intervention(s): Limited activity within patient's tolerance;Monitored during session;Repositioned  Pt remained on 2L O2 [place] during session.    Home Living                      Prior Function            PT Goals (current goals can now be found in the care plan section) Progress towards PT goals: Progressing toward goals    Frequency  Min 3X/week    PT Plan Current plan remains appropriate    Co-evaluation             End of Session Equipment Utilized During Treatment: Gait belt;Oxygen Activity Tolerance: Patient tolerated treatment well Patient left: with call bell/phone within reach;with family/visitor present;with bed alarm set;in bed     Time: 1320-1345 PT Time Calculation (min) (ACUTE ONLY): 25 min  Charges:  $Therapeutic Activity: 23-37 mins                    G Codes:      Brandye Inthavong,KATHrine E June 23, 2015, 3:24 PM

## 2015-06-07 NOTE — Progress Notes (Signed)
Patient ID: Jack Schultz, male   DOB: [birth year], 81 y.o.   MRN: [id number]         [facility]    Date of Admission:  05/31/2015           Day 1 ceftazidime  Principal Problem:   Fever Active Problems:   Symptomatic anemia   Acute kidney injury   Frequent falls   Complete heart block   Acute pain of left shoulder   Cellulitis of arm, left   Essential thrombocytosis   Essential hypertension   Vitamin D deficiency   Hypothyroidism   Hyperkalemia   Hyponatremia   Leucocytosis   Blood poisoning   Acute pericarditis   Atrial fibrillation   Absolute anemia   Acute confusional state   . cefTAZidime (FORTAZ)  IV  1 g Intravenous Q12H  . cholecalciferol  5,000 Units Oral Daily  . ferrous sulfate  325 mg Oral Daily  . hydroxyurea  1,000 mg Oral Daily  . levothyroxine  50 mcg Oral QAC breakfast    SUBJECTIVE: He states that he is feeling better today. He is not having as much pain in his left hand and wrist. He only has occasional cough productive of a small knot of white sputum. He denies feeling short of breath. He denies any itching, abdominal pain, diarrhea and dysuria.  Review of Systems: Pertinent items are noted in HPI.  Past Medical History  Diagnosis Date  . Hypertension   . Hyperlipidemia   . Pre-diabetes   . Thyroid disease   . Thrombocytosis   . GERD (gastroesophageal reflux disease)   . Vitamin D deficiency     Social History  Substance Use Topics  . Smoking status: Former Smoker    Quit date: 09/24/1975  . Smokeless tobacco: Current User  . Alcohol Use: No    Family History  Problem Relation Age of Onset  . Hypertension Father   . Heart disease Father    Allergies  Allergen Reactions  . Ace Inhibitors Cough    OBJECTIVE: Filed Vitals:   06/06/15 2117 06/07/15 0118 06/07/15 0500 06/07/15 0806  BP: 104/35 148/54 116/50 152/53  Pulse: 88  107 108  Temp: 98.5 F (36.9 C)  99.7 F (37.6 C)   TempSrc: Oral   Oral   Resp: 18  20 22   Height:      Weight:      SpO2: 94%  94% 95%   Body mass index is 25.87 kg/(m^2).  General: he was sleeping quietly in bed but arouses easily. He is lucid and answers all questions appropriately. His wife and son are present Skin: no significant rash noted Lungs: clear Cor: hear regular S1 and S2 with no murmur or rubs noted Abdomen: soft and nontender Joints and extremities: He has decreased erythema and swelling on the dorsum of his left hand. He has less pain with palpation and range of motion  Lab Results Lab Results  Component Value Date   WBC 37.5* 06/07/2015   HGB 8.1* 06/07/2015   HCT 26.2* 06/07/2015   MCV 93.2 06/07/2015   PLT 600* 06/07/2015    Lab Results  Component Value Date   CREATININE 1.48* 06/07/2015   BUN 35* 06/07/2015   NA 134* 06/07/2015   K 3.8 06/07/2015   CL 102 06/07/2015   CO2 24 06/07/2015    Lab Results  Component Value Date   ALT 24 05/31/2015   AST 49* 05/31/2015   ALKPHOS 103 05/31/2015  BILITOT 1.9* 05/31/2015     Microbiology: Recent Results (from the past 240 hour(s))  Culture, blood (routine x 2)     Status: None   Collection Time: 05/31/15  3:40 PM  Result Value Ref Range Status   Specimen Description BLOOD LEFT ANTECUBITAL  Final   Special Requests BOTTLES DRAWN AEROBIC AND ANAEROBIC 5CC  Final   Culture   Final    NO GROWTH 5 DAYS Performed at [facility]    Report Status 06/05/2015 FINAL  Final  Culture, blood (routine x 2)     Status: None   Collection Time: 05/31/15  3:45 PM  Result Value Ref Range Status   Specimen Description BLOOD BLOOD RIGHT HAND  Final   Special Requests BOTTLES DRAWN AEROBIC AND ANAEROBIC 5CC  Final   Culture   Final    NO GROWTH 5 DAYS Performed at [facility]    Report Status 06/05/2015 FINAL  Final  MRSA PCR Screening     Status: None   Collection Time: 05/31/15  6:55 PM  Result Value Ref Range Status   MRSA by PCR NEGATIVE NEGATIVE Final     Comment:        The GeneXpert MRSA Assay (FDA approved for NASAL specimens only), is one component of a comprehensive MRSA colonization surveillance program. It is not intended to diagnose MRSA infection nor to guide or monitor treatment for MRSA infections.      ASSESSMENT: The cause of his recurrent fevers remains unclear. Yesterday's chest x-ray shows some bibasilar densities and effusions. I cannot rule out the possibility of healthcare associated pneumonia superimposed upon his recent illness. I do not know if his recent fevers are part of the same process that has been going on for over one month or something new. I will continue ceftazidime pending further observation.  PLAN: 1. Continue ceftazidime  Michel Bickers, MD [facility] [phone] pager   [phone] cell 06/07/2015, 3:55 PM

## 2015-06-07 NOTE — Progress Notes (Signed)
Patient Profile: 81 yo male with HTN, HLD, thrombocytosis, recurrent epistaxis related to myeloproliferative disorder and ASA, hypothyroidism, iron deficiency anemia and GERD presented to ED on 05/31/2015 for weakness, poor PO intake and recurrent falls. Also noted to have leukocytosis on arrival. Initial EKG concerning for CHB although HR was 60-70s. Now afib/flutter with variable conduction and J point elevation (pericarditis).   Subjective: Overall better today. Not as confused. No dyspnea. He notes weakness but no other complaints. Family by bedside.  Objective: Vital signs in last 24 hours: Temp:  [98.2 F (36.8 C)-101.5 F (38.6 C)] 99.7 F (37.6 C) (09/14 0500) Pulse Rate:  [88-112] 108 (09/14 0806) Resp:  [16-22] 22 (09/14 0806) BP: (104-152)/(35-58) 152/53 mmHg (09/14 0806) SpO2:  [94 %-97 %] 95 % (09/14 0806) Last BM Date: 06/05/15  Intake/Output from previous day: 09/13 0701 - 09/14 0700 In: 360 [P.O.:360] Out: 1275 [Urine:1275] Intake/Output this shift:    Medications Current Facility-Administered Medications  Medication Dose Route Frequency Provider Last Rate Last Dose  . acetaminophen (TYLENOL) tablet 650 mg  650 mg Oral Q6H PRN Nishant Dhungel, MD   650 mg at 06/07/15 0813   Or  . acetaminophen (TYLENOL) suppository 650 mg  650 mg Rectal Q6H PRN Nishant Dhungel, MD   650 mg at 06/06/15 1100  . cholecalciferol (VITAMIN D) tablet 5,000 Units  5,000 Units Oral Daily Nishant Dhungel, MD   5,000 Units at 06/06/15 0929  . ferrous sulfate tablet 325 mg  325 mg Oral Daily Nishant Dhungel, MD   325 mg at 06/06/15 0929  . hydroxyurea (HYDREA) capsule 1,000 mg  1,000 mg Oral Daily Chauncey Cruel, MD      . levothyroxine (SYNTHROID, LEVOTHROID) tablet 50 mcg  50 mcg Oral QAC breakfast Nishant Dhungel, MD   50 mcg at 06/07/15 0813  . nitroGLYCERIN (NITROSTAT) SL tablet 0.4 mg  0.4 mg Sublingual Q5 min PRN Ritta Slot, NP   0.4 mg at 06/04/15 0144  . ondansetron (ZOFRAN)  tablet 4 mg  4 mg Oral Q6H PRN Nishant Dhungel, MD       Or  . ondansetron (ZOFRAN) injection 4 mg  4 mg Intravenous Q6H PRN Nishant Dhungel, MD   4 mg at 06/03/15 0306    PE: General appearance: alert, cooperative and no distress Neck: no carotid bruit and no JVD Lungs: clear to auscultation bilaterally Heart: irregularly irregular rhythm and regular rate Extremities: no LEE Pulses: 2+ and symmetric Skin: warm and dry Neurologic: Grossly normal  Lab Results:   Recent Labs  06/05/15 1141 06/06/15 0512 06/07/15 0608  WBC 21.5* 28.2* 37.5*  HGB 8.4* 9.0* 8.1*  HCT 27.0* 28.8* 26.2*  PLT 554* 670* 600*   BMET  Recent Labs  06/05/15 0443 06/06/15 0512 06/07/15 0608  NA 133* 135 134*  K 4.1 4.4 3.8  CL 104 103 102  CO2 23 24 24   GLUCOSE 105* 120* 98  BUN 55* 42* 35*  CREATININE 1.58* 1.32* 1.48*  CALCIUM 8.3* 8.6* 8.3*  Studies: CXR 06/06/15 FINDINGS: Cardiomegaly. Bilateral perihilar and lower lobe airspace opacities could reflect atelectasis, edema or a combination. Small bilateral pleural effusions suspected.  Advanced degenerative changes in the shoulders and thoracic spine. No acute bony abnormality.  IMPRESSION: Cardiomegaly with bilateral perihilar and lower lobe opacities. This could reflect edema, atelectasis or a combination.  Small layering effusions.   Assessment/Plan  Principal Problem:   Fever Active Problems:   Essential thrombocytosis   Essential hypertension  Vitamin D deficiency   Hypothyroidism   Symptomatic anemia   Hyperkalemia   Hyponatremia   Acute kidney injury   Frequent falls   Leucocytosis   Complete heart block   Acute pain of left shoulder   Cellulitis of arm, left   Blood poisoning   Acute pericarditis   Atrial fibrillation   Absolute anemia   1. Complete heart block transitioned to afib - initially in what appears to be CHB although HR was 60-70s on initial EKG, atenolol stopped, now appears to be  in atrial flutter with HR 70s. Heart rate controlled - TSH normal, last dose 9/6. Echo 06/01/2015 EF 55%, mild AR, mild MR, moderate TR, PA peak pressure 8mmHg. Trace effusion.   2. Pericarditis: EKG shows diffusely elevated J point ST segments suggestive of pericarditis. His pericarditis may explain the conduction abnormalities and atrial flutter. He is not having any significant pain however. At this time with increased creat, would avoid NSAIDS or colchicine.    3. Atrial Fibrillation: rate is controlled and he is asymptomatic. Given his current pericarditis and marked weakness and anemia, he is not an optimal candidate for anticoagulation at this time.   4. Anemia: Previously transfused with 2 units PRBC. Hgb slightly back down to 8.1 (9.0 yesterday). IM following.  5. Generalized weakness with frequent fall: 3 week progression. Prior to this, quite active.   6. Dehydration with AKI: Scr improving, now at 1.48 (1.58 yesterday). Echo normal EF  7. Hypertension:  BP stable.    8. Hypothyroidism: TSH normal  9. Leukocytosis: Had a slight fever overnight. White count is back up 37.5. IM following and placing on antibiotics. IM to continue w/u.   10. Prolonged QT: Haldol stopped. Would avoid QT prolonging drugs.  12. SOB: feels better on supplemental O2. CXR yesterday showed small bilateral pleural effusions and bilateral perihilar and lower lobe opacities ? Atelectasis. Lungs are CTAB today.    13. Confusion: improved. More oriented today. Family member is by bedside. IM to follow.    LOS: 7 days      Ameisha Mcclellan M. Rosita Fire, PA-C 06/07/2015 9:10 AM

## 2015-06-07 NOTE — Progress Notes (Signed)
NEURO HOSPITALIST PROGRESS NOTE   SUBJECTIVE:                                                                                                                        Resting comfortably in bed, daughter at the bedside. No new neurological developments. Unable to complete MRI brain due to severe muscle cramps, restlessness. EEG normal. Reviewed serologies significant for leukocytosis > 37,000. Normal ammonia. UA negative. Na 134, Cr 1.48   OBJECTIVE:                                                                                                                           Vital signs in last 24 hours: Temp:  [98.2 F (36.8 C)-101.5 F (38.6 C)] 99.7 F (37.6 C) (09/14 0500) Pulse Rate:  [88-112] 108 (09/14 0806) Resp:  [16-22] 22 (09/14 0806) BP: (104-152)/(35-58) 152/53 mmHg (09/14 0806) SpO2:  [94 %-97 %] 95 % (09/14 0806)  Intake/Output from previous day: 09/13 0701 - 09/14 0700 In: 360 [P.O.:360] Out: 1275 [Urine:1275] Intake/Output this shift:   Nutritional status: Diet regular Room service appropriate?: Yes; Fluid consistency:: Thin  Past Medical History  Diagnosis Date  . Hypertension   . Hyperlipidemia   . Pre-diabetes   . Thyroid disease   . Thrombocytosis   . GERD (gastroesophageal reflux disease)   . Vitamin D deficiency    Physical exam:  Constitutional: well developed, pleasant male in no apparent distress. Eyes: no jaundice or exophthalmos.  Head: normocephalic. Neck: supple, no bruits, no JVD. Cardiac: no murmurs. Lungs: clear. Abdomen: soft, no tender, no mass. Extremities: Warm, trace edema, tender to pressure over left wrist, left arm and shoulder (chronic), no clubbing, or cyanosis.  Skin: no rash  Neurologic Exam:  General: Mental Status: Alert, disoriented to place, thought content appropriate. Speech fluent without evidence of aphasia. Able to follow 3 step commands without difficulty. Cranial  Nerves: II: Discs flat bilaterally; Visual fields grossly normal, pupils equal, round, reactive to light and accommodation III,IV, VI: ptosis not present, extra-ocular motions intact bilaterally V,VII: face is symmetric, facial light touch sensation normal bilaterally VIII: hearing normal bilaterally IX,X: uvula rises symmetrically XI: bilateral shoulder shrug XII: midline tongue extension without atrophy or fasciculations  Motor:  Chronic inability to move left arm, but for the most part I can not appreciate frank muscle weakness. Tone and bulk:normal tone throughout; no atrophy noted Sensory: Pinprick and light touch intact throughout, bilaterally Deep Tendon Reflexes:  Right: Upper Extremity Left: Upper extremity   biceps (C-5 to C-6) 2/4 biceps (C-5 to C-6) 2/4 tricep (C7) 2/4triceps (C7) 2/4 Brachioradialis (C6) 2/4Brachioradialis (C6) 2/4  Lower Extremity Lower Extremity  quadriceps (L-2 to L-4) 2/4 quadriceps (L-2 to L-4) 2/4 Achilles (S1) 2/4Achilles (S1) 2/4  Plantars: Right: downgoingLeft: downgoing Cerebellar: normal finger-to-nose in the right but can not perform in the left due to chronic weakness, heel-to-shin no tested Gait:  Unable to test due to multiple leads, safety concerns.  Lab Results: Lab Results  Component Value Date/Time   CHOL 125 01/10/2015 05:02 PM   Lipid Panel No results for input(s): CHOL, TRIG, HDL, CHOLHDL, VLDL, LDLCALC in the last 72 hours.  Studies/Results: Dg Chest 2 View  06/06/2015   CLINICAL DATA:  Shortness of breath, hypertension.  EXAM: CHEST  2 VIEW  COMPARISON:  05/31/2015  FINDINGS: Cardiomegaly. Bilateral perihilar and lower lobe airspace opacities could reflect atelectasis, edema or a combination. Small bilateral pleural effusions  suspected.  Advanced degenerative changes in the shoulders and thoracic spine. No acute bony abnormality.  IMPRESSION: Cardiomegaly with bilateral perihilar and lower lobe opacities. This could reflect edema, atelectasis or a combination.  Small layering effusions.   Electronically Signed   By: Rolm Baptise M.D.   On: 06/06/2015 16:26   Ct Head Wo Contrast  06/06/2015   CLINICAL DATA:  Confusion.  Left facial droop.  EXAM: CT HEAD WITHOUT CONTRAST  TECHNIQUE: Contiguous axial images were obtained from the base of the skull through the vertex without intravenous contrast.  COMPARISON:  06/01/2015  FINDINGS: There is mild cerebral and cerebellar atrophy, unchanged. Minimal periventricular white matter hypoattenuation is unchanged and nonspecific but compatible with chronic small vessel ischemia and not greater than expected for patient's age.  Orbits are unremarkable. Small right maxillary sinus mucous retention cyst and trace left inferior mastoid air cell opacification are noted. No skull fracture. Calcified atherosclerosis at the skullbase.  IMPRESSION: No evidence of acute intracranial abnormality.   Electronically Signed   By: Logan Bores M.D.   On: 06/06/2015 15:44    MEDICATIONS                                                                                                                        Scheduled: . cefTAZidime (FORTAZ)  IV  1 g Intravenous Q12H  . cholecalciferol  5,000 Units Oral Daily  . ferrous sulfate  325 mg Oral Daily  . furosemide  40 mg Intravenous Once  . hydroxyurea  1,000 mg Oral Daily  . levothyroxine  50 mcg Oral QAC breakfast    ASSESSMENT/PLAN:  81 y.o. male admitted to [facility] with sepsis, suspicion for acute pericarditis/ myopericarditis, at some point noted to be on atrial flutter rate controlled, reportedly confused and agitated. Further, staff initially noticed  some left face droopiness that is not present on my assessment today. MRI brain couldn't be completed, EEG is normal, and on today neuro-exam I can not identify lateralizing signs to make me concern about structural brain involvement. In sum, patient confusion likely a reflection of ongoing infection. Will not recommend further neurological testing at this moment. Please, call neurology with new developments, questions, or concerns.  Dorian Pod, MD [facility] [phone]  06/07/2015, 11:07 AM

## 2015-06-07 NOTE — Procedures (Signed)
EEG report.  Brief clinical history: 81 y.o. male admitted to [facility] with sepsis, suspicion for acute pericarditis/ myopericarditis, at some point noted to be on atrial fib rate controlled, reportedly confused and agitated last night and today. Further, staff noticed some left face droopiness.   Technique: this is a 17 channel routine scalp EEG performed at the bedside with bipolar and monopolar montages arranged in accordance to the international 10/20 system of electrode placement. One channel was dedicated to EKG recording.  The study was performed during wakefulness, drowsiness, and stage 2 sleep. No activating procedures performed.  Description:In the wakeful state, the best background consisted of a medium amplitude, posterior dominant, poorly sustained , symmetric and reactive 9 Hz rhythm. Drowsiness demonstrated dropout of the alpha rhythm. Stage 2 sleep showed symmetric and synchronous sleep spindles without intermixed epileptiform discharges..  No focal or generalized epileptiform discharges noted.  No pathologic areas of slowing seen.  EKG showed sinus rhythm.  Impression: this is a normal awake and asleep EEG. Please, be aware that a normal EEG does not exclude the possibility of epilepsy.  Clinical correlation is advised.   Dorian Pod, MD [facility]

## 2015-06-07 NOTE — Progress Notes (Signed)
ANTIBIOTIC CONSULT NOTE   Pharmacy Consult for Ceftazidime Indication: cellulitis vs pericarditis vs PNA  Allergies  Allergen Reactions  . Ace Inhibitors Cough    Patient Measurements: Height: 5\' 9"  (175.3 cm) Weight: 175 lb 4.3 oz (79.5 kg) IBW/kg (Calculated) : 70.7  Vital Signs: Temp: 99.7 F (37.6 C) (09/14 0500) Temp Source: Oral (09/14 0500) BP: 152/53 mmHg (09/14 0806) Pulse Rate: 108 (09/14 0806) Intake/Output from previous day: 09/13 0701 - 09/14 0700 In: 360 [P.O.:360] Out: 1275 [Urine:1275]  Labs:  Recent Labs  06/05/15 0443 06/05/15 1141 06/06/15 0512 06/07/15 0608  WBC 21.0* 21.5* 28.2* 37.5*  HGB 8.9* 8.4* 9.0* 8.1*  PLT 564* 554* 670* 600*  CREATININE 1.58*  --  1.32* 1.48*   Estimated Creatinine Clearance: 33.8 mL/min (by C-G formula based on Cr of 1.48). No results for input(s): VANCOTROUGH, VANCOPEAK, VANCORANDOM, GENTTROUGH, GENTPEAK, GENTRANDOM, TOBRATROUGH, TOBRAPEAK, TOBRARND, AMIKACINPEAK, AMIKACINTROU, AMIKACIN in the last 72 hours.   Microbiology: Recent Results (from the past 720 hour(s))  Culture, blood (routine x 2)     Status: None   Collection Time: 05/31/15  3:40 PM  Result Value Ref Range Status   Specimen Description BLOOD LEFT ANTECUBITAL  Final   Special Requests BOTTLES DRAWN AEROBIC AND ANAEROBIC 5CC  Final   Culture   Final    NO GROWTH 5 DAYS Performed at [facility]    Report Status 06/05/2015 FINAL  Final  Culture, blood (routine x 2)     Status: None   Collection Time: 05/31/15  3:45 PM  Result Value Ref Range Status   Specimen Description BLOOD BLOOD RIGHT HAND  Final   Special Requests BOTTLES DRAWN AEROBIC AND ANAEROBIC 5CC  Final   Culture   Final    NO GROWTH 5 DAYS Performed at [facility]    Report Status 06/05/2015 FINAL  Final  MRSA PCR Screening     Status: None   Collection Time: 05/31/15  6:55 PM  Result Value Ref Range Status   MRSA by PCR NEGATIVE NEGATIVE Final    Comment:         The GeneXpert MRSA Assay (FDA approved for NASAL specimens only), is one component of a comprehensive MRSA colonization surveillance program. It is not intended to diagnose MRSA infection nor to guide or monitor treatment for MRSA infections.     Medical History: Past Medical History  Diagnosis Date  . Hypertension   . Hyperlipidemia   . Pre-diabetes   . Thyroid disease   . Thrombocytosis   . GERD (gastroesophageal reflux disease)   . Vitamin D deficiency     Assessment: 59 yoM presented to ED on 9/7 with weakness, falls, and abdominal pain.  PMH includes HTN, HLD, thrombocytosis (on Hydroxyurea), epistaxis, iron deficiency anemia, GERD.  He was recently on Keflex for cellulitis of distal left forearm, started on 9/1.  Originally started on vancomycin and ceftriaxone, stopped 9/13 by ID.  Source remains unclear, but with new fevers yesterday and continually rising WBC, to restart on Ceftazidime alone per pharmacy (drug selection suggested by ID)  9/8 >> Zosyn >> 9/8  9/8 >> Vanc >> 9/13 9/8 >> ceftriaxone >> 9/13 9/14 >> ceftazidime >>   Temp: recent fevers to 101.5, previously afebrile WBC: previously stable around 21, now rising again Renal: SCr sl bump overnight, but improved overall since 9/10; CrCl 33 CG Recent imaging studies unremarkable for infection  9/7 blood: NGF 9/13 blood: IP   Goal of Therapy:  Appropriate  abx dosing, eradication of infection.   Plan:  Day 1 antibiotics restart, day 6 total abx  Ceftazidime 1 g IV q12 hr  Follow clinical course, renal function, culture results as available  Follow for de-escalation of antibiotics and LOT   Reuel Boom, PharmD, BCPS Pager: [phone] 06/07/2015, 10:11 AM

## 2015-06-07 NOTE — Clinical Documentation Improvement (Signed)
Internal Medicine  Can the diagnosis of Malnutrition be further specified?. Malnutrition mentioned by Oncology twice; not in Medicine's note. Please render an opinion and document findings in progress note.   Document Severity - Severe(third degree), Moderate (second degree), Mild (first degree)  Other condition  Unable to clinically determine  Document any associated diagnoses/conditions  Supporting Information: :   Patient is 5'9" tall weighing 171 pounds; BMI = 25.3  No supplements ordered; no Dietary Consult  Please exercise your independent, professional judgment when responding. A specific answer is not anticipated or expected.  Thank You, Zoila Shutter BSN, [place] [phone]

## 2015-06-07 NOTE — Evaluation (Signed)
SLP Cancellation Note  Patient Details Name: Jack Schultz MRN: [id number] DOB: 01-21-1924   Cancelled treatment:       Reason Eval/Treat Not Completed: Patient at procedure or test/unavailable (pt having EEG currently, will continue efforts for evaluation)  Luanna Salk, [employer] [facility] SLP [phone]

## 2015-06-07 NOTE — Progress Notes (Signed)
[facility] PROGRESS NOTE  Jack Schultz YQM:[id number] DOB: 1923/11/04 DOA: 05/31/2015 PCP: [name], MD   Brief narrative 81 year old male with history of hypertension, hyperlipidemia, thrombocytosis (on hydroxyurea, sees Dr. [name]), recurrent epistaxis for to be related to myeloproliferative disorder and aspirin therapy hypothyroidism, iron deficiency anemia and GERD brought to the ED by family as patient has been progressively getting weak for the past 4 weeks with poor by mouth intake and recurrent falls. Patient reports having frequent falls without any loss of consciousness or sustaining any injury. He was seen by his PCP on 9/1 days ago and was found to have cellulitis of his distal left forearm and prescribed a 2 weeks course of Keflex. He reports that since then he has been having pain in his left arm and shoulder. Family also reports patient having off-and-on diffuse abdominal pain past 1 week. No associated nausea and vomiting or bowel symptoms. Patient denies headache, dizziness, fever, chills, nausea , vomiting, chest pain, palpitations, SOB, abdominal pain, bowel or urinary symptoms. Denies change in weight but has poor appetite for last 3 weeks.  Course in the ED Patient blood pressure was low normal at 94/48 mmHg O2 sat of 89% on room air. Blood will done showed significant leukocytosis with WBC of 17.6, hemoglobin of 7.4 (baseline hemoglobin of 9-10), clumped platelets. Chemistry showed sodium of 129, potassium 5.4, chloride of 97, BUN of 42 and creatinine 1.48. Glucose 119. Chest x-ray and UA were unremarkable. Hospitalist admission requested for further management   Assessment/Plan: Sepsis Chest x-ray and UA unremarkable. Sepsis pathway initiated on admission. Blood cultures negative. 2-D echo does not comment on possible vegetations. Elevated ESR and CRP. Given diffuse ST elevation with pericardia effusion on echo acute myopericarditis is of concern.   -X-ray of the left shoulder and wrist negative for effusion. -Appreciate ID recommendations.  Resume ceftazidime , discussed with Dr Megan Salon  Acute encephalopathy Patient was agitated overnight. He received ativan and haldol at 2 am and 4 am.  Related to fever, medications.  Unable to tolerates MRI.   Respiratory failure; Hypoxemia:  Received a dose of lasix 9-12. Will repeat another dose today.  Chest x ray with pleural effusion. Will give IV lasix today.   Essential thrombocytosis, Leukocytosis/  -On hydroxyurea as outpatient. (Sees Dr. [name]). on hold due to acute kidney injury. Has history of recurrent epistaxis suspected due to myeloproliferative disorder and aspirin therapy. -Patient with persistent leukocytosis. In reviewing records, he had in peripheral smear blast Metas and myelos labs 04-25-2015.  -Consulted hematology due to leukocytoses and history of myeloproliferative diseases.  -LDH 184, peripheral smear review by hematology no significant left shift.  -started on Agrylin 9-12. Discontinue due to rash. Management per oncologist  -follow WBC trend.   Complete heart block Continue to monitor on telemetry. Discontinued metoprolol. Heart rate maintained in the 70s-80s. Cardiology following.  2-D echo with normal EF. No sings of  possible vegetation. Has trivial pericardial effusion. Ideally would need a pacemaker but not plan for now given underlying sepsis.  Elevated troponin  EKG done showed ST changes in inferior leads (? ST elevation). Troponin of 0.08 x 3. No chest pain.  Possibly due to  myopericarditis.   Acute pericarditis/ myopericarditis - Suspicion given diffuse ST elevation, trivial pericardial effusion on echo and underlying sepsis. However given his anemia will avoid high dose of aspirin and given his acute kidney injury cannot use NSAIDs or colchicine. -denies chest pain.    Hypotension Secondary to sepsis and  dehydration. Discontinued fluids  Acute  kidney injury Possibly due to dehydration and sepsis. Avoid nephrotoxins. .  Repeat UA negative for infection. Bladder scanned without residual. Repeat renal US. Follow urine lites. Avoid nephrotoxins. Cr stable, trending down. Received one dose of lasix 9-12.  Symptomatic anemia Baseline hemoglobin of around 9. Hemoglobin of 7.2 upon presentation. Received 2 units PRBC. Improved posttransfusion.. Stool for occult blood negative. TSH and B12 normal.  Generalized weakness with frequent falls Appears to be multifactorial with sepsis, anemia and dehydration. A.m. cortisol normal. Normal TSH and B12. Need SNF at discharge  Left hand and shoulder pain X-rays unremarkable. Has limited mobility of the left arm due to shoulder pain.  Check uric acid.  Resume antibiotics.   Abdominal pain Resolved. Abdominal ultrasound unremarkable. Showed splenomegaly.   hypothyroidism TSH is normal. Continue Synthroid  Prolonged Qtc (530)  Normal K and magnesium. Monitor on telemetry.   DVT prophylaxis: SCDs  Diet: Regular   Code Status: Full code Family Communication: Discussed with  daughter at bedside Disposition Plan: Continue telemetry monitoring. Potential discharge in next 72 hrs. Awaiting ID recommendation.    Consultants:  Cardiology  Infectious disease  Procedures:  CT head  Ultrasound abdomen  2-D echo   Antibiotics:  IV vancomycin since 9/7--  IV Zosyn 9/ 7-9/8  IV Rocephin 9/8--  HPI/Subjective: He is more alert today. sitll with swelling left hand, worse.  No abdominal pain. No significant dyspnea.   Objective: Filed Vitals:   06/07/15 0806  BP: 152/53  Pulse: 108  Temp:   Resp: 22    Intake/Output Summary (Last 24 hours) at 06/07/15 0900 Last data filed at 06/07/15 0501  Gross per 24 hour  Intake    360 ml  Output   1275 ml  Net   -915 ml   Filed Weights   05/31/15 1710 06/03/15 0400 06/06/15 0435  Weight: 77.6 kg (171 lb 1.2 oz) 84.4 kg (186 lb  1.1 oz) 79.5 kg (175 lb 4.3 oz)    Exam:   General:  Elderly male lying in bed in no acute distress  HEENT:  moist oral mucosa, supple neck  Chest: bilateral  crackles.    CVS: Normal S1 and S2, no murmurs rub or gallop  GI: Soft, nondistended, nontender, bowel sounds present  Musculoskeletal: Warm, trace  edema, tender to pressure over left wrist, left arm and shoulder (chronic)  CNS: Alert , confused, follows command, has chronic weakness, inability to move left arm. Rest of motor strength  non focal.    Data Reviewed: Basic Metabolic Panel:  Recent Labs Lab 06/02/15 0229 06/03/15 0803 06/04/15 0530 06/04/15 0910 06/05/15 0443 06/06/15 0512 06/06/15 1120 06/07/15 0608  NA 128* 129* 132*  --  133* 135  --  134*  K 3.7 4.5 4.4  --  4.1 4.4  --  3.8  CL 99* 100* 102  --  104 103  --  102  CO2 19* 18* 22  --  23 24  --  24  GLUCOSE 152* 131* 121*  --  105* 120*  --  98  BUN 42* 52* 60*  --  55* 42*  --  35*  CREATININE 1.55* 1.82* 1.76*  --  1.58* 1.32*  --  1.48*  CALCIUM 7.5* 8.0* 8.1*  --  8.3* 8.6*  --  8.3*  MG 2.1  --   --  2.5*  --   --  2.1  --    Liver Function Tests:  Recent  Labs Lab 05/31/15 1022  AST 49*  ALT 24  ALKPHOS 103  BILITOT 1.9*  PROT 6.2*  ALBUMIN 2.9*    Recent Labs Lab 05/31/15 1022  LIPASE 20*    Recent Labs Lab 06/06/15 1120  AMMONIA 17   CBC:  Recent Labs Lab 05/31/15 1022  06/04/15 0530 06/05/15 0443 06/05/15 1141 06/06/15 0512 06/07/15 0608  WBC 17.6*  < > 21.5* 21.0* 21.5* 28.2* 37.5*  NEUTROABS 15.7*  --   --   --  19.4*  --   --   HGB 7.4*  < > 8.4* 8.9* 8.4* 9.0* 8.1*  HCT 23.3*  < > 26.8* 28.0* 27.0* 28.8* 26.2*  MCV 94.7  < > 93.7 94.3 94.4 94.4 93.2  PLT PLATELET CLUMPS NOTED ON SMEAR, COUNT APPEARS ADEQUATE  < > 525* 564* 554* 670* 600*  < > = values in this interval not displayed. Cardiac Enzymes:  Recent Labs Lab 05/31/15 1530 06/02/15 0110 06/02/15 0822 06/02/15 1341  CKTOTAL 50  --    --   --   TROPONINI  --  0.08* 0.08* 0.08*   BNP (last 3 results)  Recent Labs  06/06/15 0512  BNP 425.4*    ProBNP (last 3 results) No results for input(s): PROBNP in the last 8760 hours.  CBG:  Recent Labs Lab 05/31/15 0923 06/01/15 1138 06/02/15 0039 06/03/15 0243 06/06/15 1157  GLUCAP 159* 170* 155* 156* 130*    Recent Results (from the past 240 hour(s))  Culture, blood (routine x 2)     Status: None   Collection Time: 05/31/15  3:40 PM  Result Value Ref Range Status   Specimen Description BLOOD LEFT ANTECUBITAL  Final   Special Requests BOTTLES DRAWN AEROBIC AND ANAEROBIC 5CC  Final   Culture   Final    NO GROWTH 5 DAYS Performed at [facility]    Report Status 06/05/2015 FINAL  Final  Culture, blood (routine x 2)     Status: None   Collection Time: 05/31/15  3:45 PM  Result Value Ref Range Status   Specimen Description BLOOD BLOOD RIGHT HAND  Final   Special Requests BOTTLES DRAWN AEROBIC AND ANAEROBIC 5CC  Final   Culture   Final    NO GROWTH 5 DAYS Performed at [facility]    Report Status 06/05/2015 FINAL  Final  MRSA PCR Screening     Status: None   Collection Time: 05/31/15  6:55 PM  Result Value Ref Range Status   MRSA by PCR NEGATIVE NEGATIVE Final    Comment:        The GeneXpert MRSA Assay (FDA approved for NASAL specimens only), is one component of a comprehensive MRSA colonization surveillance program. It is not intended to diagnose MRSA infection nor to guide or monitor treatment for MRSA infections.      Studies: Dg Chest 2 View  06/06/2015   CLINICAL DATA:  Shortness of breath, hypertension.  EXAM: CHEST  2 VIEW  COMPARISON:  05/31/2015  FINDINGS: Cardiomegaly. Bilateral perihilar and lower lobe airspace opacities could reflect atelectasis, edema or a combination. Small bilateral pleural effusions suspected.  Advanced degenerative changes in the shoulders and thoracic spine. No acute bony abnormality.  IMPRESSION:  Cardiomegaly with bilateral perihilar and lower lobe opacities. This could reflect edema, atelectasis or a combination.  Small layering effusions.   Electronically Signed   By: Charlett Nose M.D.   On: 06/06/2015 16:26   Ct Head Wo Contrast  06/06/2015  CLINICAL DATA:  Confusion.  Left facial droop.  EXAM: CT HEAD WITHOUT CONTRAST  TECHNIQUE: Contiguous axial images were obtained from the base of the skull through the vertex without intravenous contrast.  COMPARISON:  06/01/2015  FINDINGS: There is mild cerebral and cerebellar atrophy, unchanged. Minimal periventricular white matter hypoattenuation is unchanged and nonspecific but compatible with chronic small vessel ischemia and not greater than expected for patient's age.  Orbits are unremarkable. Small right maxillary sinus mucous retention cyst and trace left inferior mastoid air cell opacification are noted. No skull fracture. Calcified atherosclerosis at the skullbase.  IMPRESSION: No evidence of acute intracranial abnormality.   Electronically Signed   By: Logan Bores M.D.   On: 06/06/2015 15:44    Scheduled Meds: . cholecalciferol  5,000 Units Oral Daily  . ferrous sulfate  325 mg Oral Daily  . levothyroxine  50 mcg Oral QAC breakfast   Continuous Infusions:      Time spent: 25 minutes    Jack Schultz A  [facility] Pager [phone] If 7PM-7AM, please contact night-coverage at [email or web address], password [facility] 06/07/2015, 9:00 AM  LOS: 7 days

## 2015-06-08 ENCOUNTER — Inpatient Hospital Stay (HOSPITAL_COMMUNITY): Payer: Medicare Other

## 2015-06-08 ENCOUNTER — Encounter (HOSPITAL_COMMUNITY): Payer: Self-pay | Admitting: Radiology

## 2015-06-08 ENCOUNTER — Ambulatory Visit (HOSPITAL_COMMUNITY): Payer: Medicare Other

## 2015-06-08 DIAGNOSIS — R140 Abdominal distension (gaseous): Secondary | ICD-10-CM | POA: Insufficient documentation

## 2015-06-08 DIAGNOSIS — R509 Fever, unspecified: Secondary | ICD-10-CM

## 2015-06-08 DIAGNOSIS — E440 Moderate protein-calorie malnutrition: Secondary | ICD-10-CM | POA: Insufficient documentation

## 2015-06-08 DIAGNOSIS — R05 Cough: Secondary | ICD-10-CM

## 2015-06-08 LAB — CBC WITH DIFFERENTIAL/PLATELET
BAND NEUTROPHILS: 0 %
BASOS PCT: 0 %
Basophils Absolute: 0 10*3/uL (ref 0.0–0.1)
Blasts: 0 %
EOS ABS: 0 10*3/uL (ref 0.0–0.7)
EOS PCT: 0 %
HCT: 25.4 % — ABNORMAL LOW (ref 39.0–52.0)
Hemoglobin: 8 g/dL — ABNORMAL LOW (ref 13.0–17.0)
LYMPHS ABS: 0.7 10*3/uL (ref 0.7–4.0)
LYMPHS PCT: 2 %
MCH: 29.1 pg (ref 26.0–34.0)
MCHC: 31.5 g/dL (ref 30.0–36.0)
MCV: 92.4 fL (ref 78.0–100.0)
MONO ABS: 0.7 10*3/uL (ref 0.1–1.0)
Metamyelocytes Relative: 2 %
Monocytes Relative: 2 %
Myelocytes: 0 %
NEUTROS PCT: 94 %
NRBC: 0 /100{WBCs}
Neutro Abs: 35.5 10*3/uL — ABNORMAL HIGH (ref 1.7–7.7)
OTHER: 0 %
PLATELETS: 486 10*3/uL — AB (ref 150–400)
Promyelocytes Absolute: 0 %
RBC: 2.75 MIL/uL — ABNORMAL LOW (ref 4.22–5.81)
RDW: 23.3 % — AB (ref 11.5–15.5)
WBC: 36.9 10*3/uL — ABNORMAL HIGH (ref 4.0–10.5)

## 2015-06-08 LAB — BASIC METABOLIC PANEL
Anion gap: 7 (ref 5–15)
BUN: 46 mg/dL — AB (ref 6–20)
CALCIUM: 8 mg/dL — AB (ref 8.9–10.3)
CO2: 25 mmol/L (ref 22–32)
CREATININE: 2.12 mg/dL — AB (ref 0.61–1.24)
Chloride: 98 mmol/L — ABNORMAL LOW (ref 101–111)
GFR calc Af Amer: 30 mL/min — ABNORMAL LOW (ref >60)
GFR, EST NON AFRICAN AMERICAN: 26 mL/min — AB (ref >60)
GLUCOSE: 105 mg/dL — AB (ref 65–99)
Potassium: 4.2 mmol/L (ref 3.5–5.1)
Sodium: 130 mmol/L — ABNORMAL LOW (ref 135–145)

## 2015-06-08 MED ORDER — ACETAMINOPHEN 650 MG RE SUPP: 650.0000 mg | Freq: Four times a day (QID) | RECTAL | Status: DC | PRN

## 2015-06-08 MED ORDER — IOHEXOL 300 MG/ML  SOLN: 25.0000 mL | Freq: Once | INTRAMUSCULAR | Status: AC | PRN

## 2015-06-08 MED ORDER — BISACODYL 10 MG RE SUPP
10.0000 mg | Freq: Once | RECTAL | Status: AC
  Administered 2015-06-08: 10 mg via RECTAL
  Filled 2015-06-08: qty 1

## 2015-06-08 MED ORDER — PROMETHAZINE HCL 25 MG/ML IJ SOLN
12.5000 mg | Freq: Once | INTRAMUSCULAR | Status: AC
  Administered 2015-06-08: 12.5 mg via INTRAVENOUS
  Filled 2015-06-08: qty 1

## 2015-06-08 MED ORDER — ACETAMINOPHEN 650 MG RE SUPP
325.0000 mg | RECTAL | Status: AC
  Administered 2015-06-08: 325 mg via RECTAL

## 2015-06-08 MED ORDER — POLYETHYLENE GLYCOL 3350 17 G PO PACK
17.0000 g | PACK | Freq: Two times a day (BID) | ORAL | Status: DC
  Administered 2015-06-08 – 2015-06-09 (×2): 17 g via ORAL
  Filled 2015-06-08 (×2): qty 1

## 2015-06-08 MED ORDER — ACETAMINOPHEN 325 MG PO TABS: 325.0000 mg | ORAL_TABLET | ORAL | Status: DC

## 2015-06-08 MED ORDER — SODIUM CHLORIDE 0.9 % IV BOLUS (SEPSIS)
250.0000 mL | Freq: Once | INTRAVENOUS | Status: AC
  Administered 2015-06-08: 250 mL via INTRAVENOUS

## 2015-06-08 MED ORDER — ACETAMINOPHEN 650 MG RE SUPP
650.0000 mg | Freq: Four times a day (QID) | RECTAL | Status: DC | PRN
  Filled 2015-06-08: qty 1

## 2015-06-08 MED ORDER — SODIUM CHLORIDE 0.9 % IV SOLN
INTRAVENOUS | Status: DC
  Administered 2015-06-08: 14:00:00 via INTRAVENOUS

## 2015-06-08 MED ORDER — ACETAMINOPHEN 650 MG RE SUPP: 325.0000 mg | RECTAL | Status: DC

## 2015-06-08 NOTE — Progress Notes (Signed)
Initial Nutrition Assessment  DOCUMENTATION CODES:   Non-severe (moderate) malnutrition in context of acute illness/injury  INTERVENTION:  - Continue Ensure Enlive po BID, each supplement provides 350 kcal and 20 grams of protein - Encourage PO intake throughout the day with meal, supplements, and snacks (if pt will accept snacks) - RD will continue to monitor for needs  NUTRITION DIAGNOSIS:   Inadequate oral intake related to poor appetite as evidenced by per patient/family report.  GOAL:   Patient will meet greater than or equal to 90% of their needs  MONITOR:   PO intake, Supplement acceptance, Weight trends, Labs  REASON FOR ASSESSMENT:   Malnutrition Screening Tool, Consult Assessment of nutrition requirement/status  ASSESSMENT:   81 year old male with history of hypertension, hyperlipidemia, thrombocytosis (on hydroxyurea, sees Dr. [name]), recurrent epistaxis for to be related to myeloproliferative disorder and aspirin therapy hypothyroidism, iron deficiency anemia and GERD brought to the ED by family as patient has been progressively getting weak for the past 4 weeks with poor by mouth intake and recurrent falls.  Pt seen for MST and consult. BMI indicates overweight status. Per chart review, pt ate 75% breakfast yesterday and 75% breakfast and lunch 9/13, both on Regular diet.  Pt is currently NPO. SLP saw pt this AM and recommended Dysphagia 3 with thin liquids. Pt states he is currently not hungry. He states that for breakfast he had 2 hardboiled eggs, peaches, cranberry juice, and coffee. He denies abdominal pain or nausea associated with intakes and does not have any of those feelings at time of RD visit.  He denies chewing difficulties but states he sometimes has to cough to clear his throat when eating. He has Ensure Enlive on bedside table and states he was drinking Boost BID PTA; offered to switch Ensure to Boost per preferences but pt declined.  Pt reports  that he had a good appetite until 1 month ago when it began to decline. He is unsure of weight trends during that time. Per chart review, pt has lost 12 lbs (6% body weight) in the past month which is significant for time frame.  Mild muscle wasting to clavicle area and temple. No fat wasting. Likely not fully meeting needs. Medications reviewed. Labs reviewed; Na: 130 mmol/L, Cl: 98 mmol/L, Ca: 8 mg/dL, BUN/creatinine elevated and trending up.   Diet Order:  Diet NPO time specified  Skin:  Reviewed, no issues  Last BM:  9/12  Height:   Ht Readings from Last 1 Encounters:  05/31/15 5\' 9"  (1.753 m)    Weight:   Wt Readings from Last 1 Encounters:  06/06/15 175 lb 4.3 oz (79.5 kg)    Ideal Body Weight:  72.73 kg (kg)  BMI:  Body mass index is 25.87 kg/(m^2).  Estimated Nutritional Needs:   Kcal:  1500-1700  Protein:  65-75 grams  Fluid:  2-2.2 L/day  EDUCATION NEEDS:   No education needs identified at this time     Jarome Matin, RD, LDN Inpatient Clinical Dietitian Pager # [phone] After hours/weekend pager # [phone]

## 2015-06-08 NOTE — Progress Notes (Signed)
Hydrea resumed yesterday with the following results:  Results for BOY, DELAMATER (MRN [id number]) as of 06/08/2015 17:31  Ref. Range 06/05/2015 04:43 06/05/2015 11:41 06/06/2015 05:12 06/07/2015 06:08 06/08/2015 08:11  WBC Latest Ref Range: 4.0-10.5 K/uL 21.0 (H) 21.5 (H) 28.2 (H) 37.5 (H) 36.9 (H)   Would continue hydrea at current dose and follow counts daily.

## 2015-06-08 NOTE — Progress Notes (Signed)
Patient ID: Jack Schultz, male   DOB: [birth year], 81 y.o.   MRN: [id number]         [place] [facility]    Date of Admission:  05/31/2015           Day 2 ceftazidime  Principal Problem:   Fever Active Problems:   Symptomatic anemia   Acute kidney injury   Frequent falls   Complete heart block   Acute pain of left shoulder   Cellulitis of arm, left   Essential thrombocytosis   Essential hypertension   Vitamin D deficiency   Hypothyroidism   Hyperkalemia   Hyponatremia   Leucocytosis   Blood poisoning   Acute pericarditis   Atrial fibrillation   Absolute anemia   Acute confusional state   Malnutrition of moderate degree   Abdominal distension   . bisacodyl  10 mg Rectal Once  . cefTAZidime (FORTAZ)  IV  1 g Intravenous Q12H  . cholecalciferol  5,000 Units Oral Daily  . feeding supplement (ENSURE ENLIVE)  237 mL Oral BID BM  . ferrous sulfate  325 mg Oral Daily  . hydroxyurea  1,000 mg Oral Daily  . levothyroxine  50 mcg Oral QAC breakfast  . polyethylene glycol  17 g Oral BID    SUBJECTIVE: He states that he is feeling a little better today. His left hand still feels a little tight but no longer painful. He denies any recent increase in his chronic shoulder pain. He thinks his cough is chronic and stable. One daughter tells him that she thinks it began after he came to the hospital. He has not had any diarrhea.  Review of Systems: Pertinent items are noted in HPI.  Past Medical History  Diagnosis Date  . Hypertension   . Hyperlipidemia   . Pre-diabetes   . Thyroid disease   . Thrombocytosis   . GERD (gastroesophageal reflux disease)   . Vitamin D deficiency     Social History  Substance Use Topics  . Smoking status: Former Smoker    Quit date: 09/24/1975  . Smokeless tobacco: Current User  . Alcohol Use: No    Family History  Problem Relation Age of Onset  . Hypertension Father   . Heart disease Father    Allergies    Allergen Reactions  . Ace Inhibitors Cough    OBJECTIVE: Filed Vitals:   06/08/15 0801 06/08/15 0941 06/08/15 1220 06/08/15 1650  BP:  125/62  119/50  Pulse:  87  101  Temp: 99.2 F (37.3 C)  98.2 F (36.8 C) 98.8 F (37.1 C)  TempSrc: Oral  Oral Oral  Resp:    18  Height:      Weight:      SpO2:    96%   Body mass index is 25.87 kg/(m^2).  General: He is visiting with family. He appears to be slightly confused Skin: no significant rash  Lungs: clear Cor: irregular S1 and S2 with no murmur or rubs  Abdomen: soft and nontender Joints and extremities: He has decreased erythema and swelling on the dorsum of his left hand. No pain with palpation and range of motion  Lab Results Lab Results  Component Value Date   WBC 36.9* 06/08/2015   HGB 8.0* 06/08/2015   HCT 25.4* 06/08/2015   MCV 92.4 06/08/2015   PLT 486* 06/08/2015    Lab Results  Component Value Date   CREATININE 2.12* 06/08/2015   BUN 46* 06/08/2015   NA 130*  06/08/2015   K 4.2 06/08/2015   CL 98* 06/08/2015   CO2 25 06/08/2015    Lab Results  Component Value Date   ALT 24 05/31/2015   AST 49* 05/31/2015   ALKPHOS 103 05/31/2015   BILITOT 1.9* 05/31/2015     Microbiology: Recent Results (from the past 240 hour(s))  Culture, blood (routine x 2)     Status: None   Collection Time: 05/31/15  3:40 PM  Result Value Ref Range Status   Specimen Description BLOOD LEFT ANTECUBITAL  Final   Special Requests BOTTLES DRAWN AEROBIC AND ANAEROBIC 5CC  Final   Culture   Final    NO GROWTH 5 DAYS Performed at [facility]    Report Status 06/05/2015 FINAL  Final  Culture, blood (routine x 2)     Status: None   Collection Time: 05/31/15  3:45 PM  Result Value Ref Range Status   Specimen Description BLOOD BLOOD RIGHT HAND  Final   Special Requests BOTTLES DRAWN AEROBIC AND ANAEROBIC 5CC  Final   Culture   Final    NO GROWTH 5 DAYS Performed at [facility]    Report Status 06/05/2015 FINAL   Final  MRSA PCR Screening     Status: None   Collection Time: 05/31/15  6:55 PM  Result Value Ref Range Status   MRSA by PCR NEGATIVE NEGATIVE Final    Comment:        The GeneXpert MRSA Assay (FDA approved for NASAL specimens only), is one component of a comprehensive MRSA colonization surveillance program. It is not intended to diagnose MRSA infection nor to guide or monitor treatment for MRSA infections.   Culture, blood (routine x 2)     Status: None (Preliminary result)   Collection Time: 06/06/15 11:20 AM  Result Value Ref Range Status   Specimen Description BLOOD RIGHT ARM  Final   Special Requests BOTTLES DRAWN AEROBIC AND ANAEROBIC 8CC  Final   Culture   Final    NO GROWTH 2 DAYS Performed at [facility]    Report Status PENDING  Incomplete  Culture, blood (routine x 2)     Status: None (Preliminary result)   Collection Time: 06/06/15 11:28 AM  Result Value Ref Range Status   Specimen Description BLOOD LEFT ARM  Final   Special Requests BOTTLES DRAWN AEROBIC AND ANAEROBIC 10CC  Final   Culture   Final    NO GROWTH 2 DAYS Performed at [facility]    Report Status PENDING  Incomplete     ASSESSMENT: The cause of his intermittent fevers remains unclear. I suspect that he may have some healthcare associated pneumonia superimposed on his other recent problems. I will continue ceftazidime pending further observation.  PLAN: 1. Continue ceftazidime  Michel Bickers, MD [facility] [place] [facility] [phone] pager   [phone] cell 06/08/2015, 5:21 PM

## 2015-06-08 NOTE — Evaluation (Signed)
Clinical/Bedside Swallow Evaluation Patient Details  Name: Jack Schultz MRN: [id number] Date of Birth: [birth year]  Today's Date: 06/08/2015 Time: SLP Start Time (ACUTE ONLY): 1004 SLP Stop Time (ACUTE ONLY): 1015 SLP Time Calculation (min) (ACUTE ONLY): 11 min  Past Medical History:  Past Medical History  Diagnosis Date  . Hypertension   . Hyperlipidemia   . Pre-diabetes   . Thyroid disease   . Thrombocytosis   . GERD (gastroesophageal reflux disease)   . Vitamin D deficiency    Past Surgical History: History reviewed. No pertinent past surgical history. HPI:  81 yo male admitted with fever, sepsis, heart block with possible need for pacemaker. Hx of HTN, bil shoulder pain (chronic). Pt with confusion, agitation, left facial droop 9/13, unable to tolerate MRI, neurology following.   Assessment / Plan / Recommendation Clinical Impression  Pt has SOB at baseline with questionable, subtle increase throughout PO intake. A delayed cough is observed x1 following completion of trials. He requires additional time and use of liquid washes to clear regular textures from his oral cavity. Recommend Dys 3 diet and thin liquids for energy conservation, taking rest breaks PRN for breathing. SLP to follow briefly for tolerance.a    Aspiration Risk  Mild    Diet Recommendation Dysphagia 3 (Mech soft);Thin   Medication Administration: Whole meds with puree Compensations: Minimize environmental distractions;Slow rate;Small sips/bites    Other  Recommendations Oral Care Recommendations: Oral care BID   Follow Up Recommendations       Frequency and Duration min 2x/week  1 week   Pertinent Vitals/Pain n/a    SLP Swallow Goals     Swallow Study Prior Functional Status       General Other Pertinent Information: 81 yo male admitted with fever, sepsis, heart block with possible need for pacemaker. Hx of HTN, bil shoulder pain (chronic). Pt with confusion, agitation, left facial droop  9/13, unable to tolerate MRI, neurology following. Type of Study: Bedside swallow evaluation Previous Swallow Assessment: none in chart Diet Prior to this Study: Regular;Thin liquids Temperature Spikes Noted: Yes (102.5) Respiratory Status: Supplemental O2 delivered via (comment) ([place]) History of Recent Intubation: No Behavior/Cognition: Alert;Cooperative;Pleasant mood Self-Feeding Abilities: Able to feed self Patient Positioning: Upright in bed Baseline Vocal Quality: Normal    Oral/Motor/Sensory Function     Ice Chips Ice chips: Not tested   Thin Liquid Thin Liquid: Impaired Presentation: Cup;Self Fed;Straw Pharyngeal  Phase Impairments: Cough - Delayed (x1)    Nectar Thick Nectar Thick Liquid: Not tested   Honey Thick Honey Thick Liquid: Not tested   Puree Puree: Within functional limits Presentation: Self Fed;Spoon   Solid   Solid: Impaired Presentation: Self Fed Oral Phase Impairments: Impaired anterior to posterior transit Pharyngeal Phase Impairments: Cough - Delayed (x1)      Jack Schultz, M.A. CCC-SLP [phone]  Jack Schultz 06/08/2015,10:22 AM

## 2015-06-08 NOTE — Progress Notes (Signed)
Patient Profile: 81 yo male with HTN, HLD, thrombocytosis, recurrent epistaxis related to myeloproliferative disorder and ASA, hypothyroidism, iron deficiency anemia and GERD who presented to ED on 05/31/2015 for weakness, poor PO intake and recurrent falls. Also noted to have leukocytosis on arrival. Initial EKG concerning for CHB although HR was 60-70s. Now afib/flutter with variable conduction and J point elevation (pericarditis).   Subjective: Ok today. Daughter-in-law states he had a rough night with fever. No pain or dyspnea.   Objective:  Vital signs in last 24 hours: Temp:  [98.2 F (36.8 C)-102.5 F (39.2 C)] 99.2 F (37.3 C) (09/15 0801) Pulse Rate:  [96-117] 97 (09/15 0545) Resp:  [18-20] 18 (09/15 0545) BP: (130-147)/(47-56) 130/56 mmHg (09/15 0545) SpO2:  [96 %] 96 % (09/15 0545) Last BM Date: 06/05/15  Intake/Output from previous day: 09/14 0701 - 09/15 0700 In: 340 [P.O.:240; IV Piggyback:100] Out: 950 [Urine:950] Intake/Output this shift:    Medications Current Facility-Administered Medications  Medication Dose Route Frequency Provider Last Rate Last Dose  . acetaminophen (TYLENOL) suppository 650 mg  650 mg Rectal Q6H PRN Belkys A Regalado, MD      . acetaminophen (TYLENOL) tablet 650 mg  650 mg Oral Q6H PRN Nishant Dhungel, MD   650 mg at 06/07/15 2225  . cefTAZidime (FORTAZ) 1 g in dextrose 5 % 50 mL IVPB  1 g Intravenous Q12H Polly Cobia, RPH   1 g at 06/07/15 2122  . cholecalciferol (VITAMIN D) tablet 5,000 Units  5,000 Units Oral Daily Nishant Dhungel, MD   5,000 Units at 06/07/15 1112  . feeding supplement (ENSURE ENLIVE) (ENSURE ENLIVE) liquid 237 mL  237 mL Oral BID BM Belkys A Regalado, MD   237 mL at 06/07/15 1613  . ferrous sulfate tablet 325 mg  325 mg Oral Daily Nishant Dhungel, MD   325 mg at 06/07/15 1109  . hydroxyurea (HYDREA) capsule 1,000 mg  1,000 mg Oral Daily Chauncey Cruel, MD   1,000 mg at 06/07/15 1110  . levothyroxine (SYNTHROID,  LEVOTHROID) tablet 50 mcg  50 mcg Oral QAC breakfast Nishant Dhungel, MD   50 mcg at 06/07/15 0813  . nitroGLYCERIN (NITROSTAT) SL tablet 0.4 mg  0.4 mg Sublingual Q5 min PRN Ritta Slot, NP   0.4 mg at 06/04/15 0144  . ondansetron (ZOFRAN) tablet 4 mg  4 mg Oral Q6H PRN Nishant Dhungel, MD       Or  . ondansetron (ZOFRAN) injection 4 mg  4 mg Intravenous Q6H PRN Nishant Dhungel, MD   4 mg at 06/07/15 2256    PE: General appearance: alert, cooperative and no distress Neck: no carotid bruit and no JVD Lungs: clear to auscultation bilaterally Heart: irregularly irregular rhythm and regular rate Extremities: no LEE Pulses: 2+ and symmetric Skin: warm and dry Neurologic: Grossly normal  Lab Results:   Recent Labs  06/06/15 0512 06/07/15 0608 06/08/15 0811  WBC 28.2* 37.5* 36.9*  HGB 9.0* 8.1* 8.0*  HCT 28.8* 26.2* 25.4*  PLT 670* 600* 486*   BMET  Recent Labs  06/06/15 0512 06/07/15 0608 06/08/15 0811  NA 135 134* 130*  K 4.4 3.8 4.2  CL 103 102 98*  CO2 24 24 25   GLUCOSE 120* 98 105*  BUN 42* 35* 46*  CREATININE 1.32* 1.48* 2.12*  CALCIUM 8.6* 8.3* 8.0*   Studies: CXR 06/06/15 FINDINGS: Cardiomegaly. Bilateral perihilar and lower lobe airspace opacities could reflect atelectasis, edema or a combination. Small bilateral pleural effusions suspected.  Advanced degenerative changes in the shoulders and thoracic spine. No acute bony abnormality.  IMPRESSION: Cardiomegaly with bilateral perihilar and lower lobe opacities. This could reflect edema, atelectasis or a combination.  Small layering effusions.   Assessment/Plan  Principal Problem:   Fever Active Problems:   Essential thrombocytosis   Essential hypertension   Vitamin D deficiency   Hypothyroidism   Symptomatic anemia   Hyperkalemia   Hyponatremia   Acute kidney injury   Frequent falls   Leucocytosis   Complete heart block   Acute pain of left shoulder   Cellulitis of arm, left   Blood  poisoning   Acute pericarditis   Atrial fibrillation   Absolute anemia   Acute confusional state   1. Complete heart block transitioned to afib - initially in what appears to be CHB although HR was 60-70s on initial EKG, atenolol stopped, now appears to be in atrial flutter with HR 70s. Heart rate controlled - TSH normal, last dose 9/6. Echo 06/01/2015 EF 55%, mild AR, mild MR, moderate TR, PA peak pressure 36mmHg. Trace effusion.   2. Pericarditis: EKG shows diffusely elevated J point ST segments suggestive of pericarditis. His pericarditis may explain the conduction abnormalities and atrial flutter. He is not having any significant pain however. At this time with increased creat, would avoid NSAIDS or colchicine.    3. Atrial Fibrillation: rate is controlled and he is asymptomatic. Given his current pericarditis and marked weakness and anemia, he is not an optimal candidate for anticoagulation at this time.   4. Anemia: Previously transfused with 2 units PRBC. Hgb is 8.0. IM following.  5. Generalized weakness with frequent fall: 3 week progression. Prior to this, quite active.   6. AKI:  Bump in SCr from 1.48-->2.12 overnight. BUN is 46. Lasix is on hold. He is on hydroxyurea. EF was normal on echo 06/01/15. Consider IV hydration.   7. Hypertension:  BP stable.    8. Hypothyroidism: TSH normal  9. Leukocytosis: febrile overnight with max temp of 102.5. White count still up but slightly improved since yesterday at 36.9 (37.5 yesterday). Chest x-ray shows some bibasilar densities and effusions. He is on antibiotics. ID following. Plan is for repeat echo today to ensure valves are clear.   10. Prolonged QT: Haldol stopped. Would avoid QT prolonging drugs.  12. SOB: feels better on supplemental O2. CXR yesterday showed small bilateral pleural effusions and bilateral perihilar and lower lobe opacities ? Atelectasis. Lungs are CTAB today.    13. Confusion: improved.  More oriented today. Family member is by bedside. This may become worse due to hyponatremia. IM to follow.  14. Hyponatremia: NA+ continues to drift downward. 135-->134-->130.    LOS: 8 days      Brittainy M. Rosita Fire, PA-C 06/08/2015 8:59 AM

## 2015-06-08 NOTE — Progress Notes (Signed)
[facility] PROGRESS NOTE  Jack Schultz WGN:[id number] DOB: 11-Apr-1924 DOA: 05/31/2015 PCP: [name], MD   Brief narrative 81 year old male with history of hypertension, hyperlipidemia, thrombocytosis (on hydroxyurea, sees Dr. [name]), recurrent epistaxis for to be related to myeloproliferative disorder and aspirin therapy hypothyroidism, iron deficiency anemia and GERD brought to the ED by family as patient has been progressively getting weak for the past 4 weeks with poor by mouth intake and recurrent falls. Patient reports having frequent falls without any loss of consciousness or sustaining any injury. He was seen by his PCP on 9/1 days ago and was found to have cellulitis of his distal left forearm and prescribed a 2 weeks course of Keflex. He reports that since then he has been having pain in his left arm and shoulder. Family also reports patient having off-and-on diffuse abdominal pain past 1 week. No associated nausea and vomiting or bowel symptoms. Patient denies headache, dizziness, fever, chills, nausea , vomiting, chest pain, palpitations, SOB, abdominal pain, bowel or urinary symptoms. Denies change in weight but has poor appetite for last 3 weeks.  Course in the ED Patient blood pressure was low normal at 94/48 mmHg O2 sat of 89% on room air. Blood will done showed significant leukocytosis with WBC of 17.6, hemoglobin of 7.4 (baseline hemoglobin of 9-10), clumped platelets. Chemistry showed sodium of 129, potassium 5.4, chloride of 97, BUN of 42 and creatinine 1.48. Glucose 119. Chest x-ray and UA were unremarkable. Hospitalist admission requested for further management   Assessment/Plan: Sepsis Chest x-ray and UA unremarkable. Sepsis pathway initiated on admission. Blood cultures negative. 2-D echo does not comment on possible vegetations. Elevated ESR and CRP. Given diffuse ST elevation with pericardia effusion on echo acute myopericarditis is of concern.   -X-ray of the left shoulder and wrist negative for effusion. -Appreciate ID recommendations.  Resume ceftazidime , discussed with Dr Megan Salon Continue with ceftazidime,   Acute encephalopathy Patient was agitated overnight. He received ativan and haldol at 2 am and 4 am.  Related to fever, medications.  Improved.   Respiratory failure; Hypoxemia:  Received a dose of lasix 9-12. Will repeat another dose today.  Chest x ray with pleural effusion. Will give IV lasix today.   Essential thrombocytosis, Leukocytosis/  -On hydroxyurea as outpatient. (Sees Dr. [name]). on hold due to acute kidney injury. Has history of recurrent epistaxis suspected due to myeloproliferative disorder and aspirin therapy. -Patient with persistent leukocytosis. In reviewing records, he had in peripheral smear blast Metas and myelos labs 04-25-2015.  -Consulted hematology due to leukocytoses and history of myeloproliferative diseases.  -LDH 184, peripheral smear review by hematology no significant left shift.  -started on Agrylin 9-12. Discontinue due to rash. Management per oncologist  -follow WBC trend.   Abdominal distension;  Denies pain. Will check KUB rule out free air. Will order CT abdomen pelvis.  NPO  Complete heart block Continue to monitor on telemetry. Discontinued metoprolol. Heart rate maintained in the 70s-80s. Cardiology following.  2-D echo with normal EF. No sings of  possible vegetation. Has trivial pericardial effusion. Ideally would need a pacemaker but not plan for now given underlying sepsis.  Elevated troponin  EKG done showed ST changes in inferior leads (? ST elevation). Troponin of 0.08 x 3. No chest pain.  Possibly due to  myopericarditis.   Acute pericarditis/ myopericarditis - Suspicion given diffuse ST elevation, trivial pericardial effusion on echo and underlying sepsis. However given his anemia will avoid high dose of aspirin and  given his acute kidney injury cannot use NSAIDs  or colchicine. -denies chest pain.    Hypotension Secondary to sepsis and dehydration. Discontinued fluids  Acute kidney injury Possibly due to dehydration and sepsis. Avoid nephrotoxins. .  Repeat UA negative for infection. Bladder scanned without residual. Repeat renal US. Follow urine lites. Avoid nephrotoxins. Cr increased. Start IV fluids.   Symptomatic anemia Baseline hemoglobin of around 9. Hemoglobin of 7.2 upon presentation. Received 2 units PRBC. Improved posttransfusion.. Stool for occult blood negative. TSH and B12 normal.  Generalized weakness with frequent falls Appears to be multifactorial with sepsis, anemia and dehydration. A.m. cortisol normal. Normal TSH and B12. Need SNF at discharge  Left hand and shoulder pain X-rays unremarkable. Has limited mobility of the left arm due to shoulder pain.  Check uric acid.  Resume antibiotics.   Abdominal pain Resolved. Abdominal ultrasound unremarkable. Showed splenomegaly.   hypothyroidism TSH is normal. Continue Synthroid  Prolonged Qtc (530)  Normal K and magnesium. Monitor on telemetry.   DVT prophylaxis: SCDs  Diet: Regular   Code Status: Full code Family Communication: Discussed with  daughter at bedside Disposition Plan: Continue telemetry monitoring. Potential discharge in next 72 hrs. Awaiting ID recommendation.    Consultants:  Cardiology  Infectious disease  Procedures:  CT head  Ultrasound abdomen  2-D echo   Antibiotics:  IV Zosyn 9/ 7-9/8  IV 9/14 Ceftazidime  HPI/Subjective: He is alert, answering questions. No BM, no passing gas.  He denies abdominal pain. His abdomen started to be distended overnight.   Objective: Filed Vitals:   06/08/15 0941  BP: 125/62  Pulse: 87  Temp:   Resp:     Intake/Output Summary (Last 24 hours) at 06/08/15 1030 Last data filed at 06/08/15 0954  Gross per 24 hour  Intake    337 ml  Output    950 ml  Net   -613 ml   Filed Weights    05/31/15 1710 06/03/15 0400 06/06/15 0435  Weight: 77.6 kg (171 lb 1.2 oz) 84.4 kg (186 lb 1.1 oz) 79.5 kg (175 lb 4.3 oz)    Exam:   General:  Elderly male lying in bed in no acute distress  HEENT:  moist oral mucosa, supple neck  Chest: no significant crackles.    CVS: Normal S1 and S2, no murmurs rub or gallop  GI: Soft, BS present, abdomen very distended today, no rigidity mo tender.   Musculoskeletal: Warm, trace  edema, tender to pressure over left wrist, left arm and shoulder (chronic)  CNS: alert in no distress   Data Reviewed: Basic Metabolic Panel:  Recent Labs Lab 06/02/15 0229  06/04/15 0530 06/04/15 0910 06/05/15 0443 06/06/15 0512 06/06/15 1120 06/07/15 0608 06/08/15 0811  NA 128*  < > 132*  --  133* 135  --  134* 130*  K 3.7  < > 4.4  --  4.1 4.4  --  3.8 4.2  CL 99*  < > 102  --  104 103  --  102 98*  CO2 19*  < > 22  --  23 24  --  24 25  GLUCOSE 152*  < > 121*  --  105* 120*  --  98 105*  BUN 42*  < > 60*  --  55* 42*  --  35* 46*  CREATININE 1.55*  < > 1.76*  --  1.58* 1.32*  --  1.48* 2.12*  CALCIUM 7.5*  < > 8.1*  --  8.3* 8.6*  --  8.3* 8.0*  MG 2.1  --   --  2.5*  --   --  2.1  --   --   < > = values in this interval not displayed. Liver Function Tests: No results for input(s): AST, ALT, ALKPHOS, BILITOT, PROT, ALBUMIN in the last 168 hours. No results for input(s): LIPASE, AMYLASE in the last 168 hours.  Recent Labs Lab 06/06/15 1120  AMMONIA 17   CBC:  Recent Labs Lab 06/05/15 0443 06/05/15 1141 06/06/15 0512 06/07/15 0608 06/08/15 0811  WBC 21.0* 21.5* 28.2* 37.5* 36.9*  NEUTROABS  --  19.4*  --   --  35.5*  HGB 8.9* 8.4* 9.0* 8.1* 8.0*  HCT 28.0* 27.0* 28.8* 26.2* 25.4*  MCV 94.3 94.4 94.4 93.2 92.4  PLT 564* 554* 670* 600* 486*   Cardiac Enzymes:  Recent Labs Lab 06/02/15 0110 06/02/15 0822 06/02/15 1341  TROPONINI 0.08* 0.08* 0.08*   BNP (last 3 results)  Recent Labs  06/06/15 0512  BNP 425.4*     ProBNP (last 3 results) No results for input(s): PROBNP in the last 8760 hours.  CBG:  Recent Labs Lab 06/01/15 1138 06/02/15 0039 06/03/15 0243 06/06/15 1157  GLUCAP 170* 155* 156* 130*    Recent Results (from the past 240 hour(s))  Culture, blood (routine x 2)     Status: None   Collection Time: 05/31/15  3:40 PM  Result Value Ref Range Status   Specimen Description BLOOD LEFT ANTECUBITAL  Final   Special Requests BOTTLES DRAWN AEROBIC AND ANAEROBIC 5CC  Final   Culture   Final    NO GROWTH 5 DAYS Performed at [facility]    Report Status 06/05/2015 FINAL  Final  Culture, blood (routine x 2)     Status: None   Collection Time: 05/31/15  3:45 PM  Result Value Ref Range Status   Specimen Description BLOOD BLOOD RIGHT HAND  Final   Special Requests BOTTLES DRAWN AEROBIC AND ANAEROBIC 5CC  Final   Culture   Final    NO GROWTH 5 DAYS Performed at [facility]    Report Status 06/05/2015 FINAL  Final  MRSA PCR Screening     Status: None   Collection Time: 05/31/15  6:55 PM  Result Value Ref Range Status   MRSA by PCR NEGATIVE NEGATIVE Final    Comment:        The GeneXpert MRSA Assay (FDA approved for NASAL specimens only), is one component of a comprehensive MRSA colonization surveillance program. It is not intended to diagnose MRSA infection nor to guide or monitor treatment for MRSA infections.   Culture, blood (routine x 2)     Status: None (Preliminary result)   Collection Time: 06/06/15 11:20 AM  Result Value Ref Range Status   Specimen Description BLOOD RIGHT ARM  Final   Special Requests BOTTLES DRAWN AEROBIC AND ANAEROBIC 8CC  Final   Culture   Final    NO GROWTH 1 DAY Performed at [facility]    Report Status PENDING  Incomplete  Culture, blood (routine x 2)     Status: None (Preliminary result)   Collection Time: 06/06/15 11:28 AM  Result Value Ref Range Status   Specimen Description BLOOD LEFT ARM  Final   Special  Requests BOTTLES DRAWN AEROBIC AND ANAEROBIC 10CC  Final   Culture   Final    NO GROWTH 1 DAY Performed at [facility]    Report Status PENDING  Incomplete  Studies: Dg Chest 2 View  06/06/2015   CLINICAL DATA:  Shortness of breath, hypertension.  EXAM: CHEST  2 VIEW  COMPARISON:  05/31/2015  FINDINGS: Cardiomegaly. Bilateral perihilar and lower lobe airspace opacities could reflect atelectasis, edema or a combination. Small bilateral pleural effusions suspected.  Advanced degenerative changes in the shoulders and thoracic spine. No acute bony abnormality.  IMPRESSION: Cardiomegaly with bilateral perihilar and lower lobe opacities. This could reflect edema, atelectasis or a combination.  Small layering effusions.   Electronically Signed   By: Rolm Baptise M.D.   On: 06/06/2015 16:26   Ct Head Wo Contrast  06/06/2015   CLINICAL DATA:  Confusion.  Left facial droop.  EXAM: CT HEAD WITHOUT CONTRAST  TECHNIQUE: Contiguous axial images were obtained from the base of the skull through the vertex without intravenous contrast.  COMPARISON:  06/01/2015  FINDINGS: There is mild cerebral and cerebellar atrophy, unchanged. Minimal periventricular white matter hypoattenuation is unchanged and nonspecific but compatible with chronic small vessel ischemia and not greater than expected for patient's age.  Orbits are unremarkable. Small right maxillary sinus mucous retention cyst and trace left inferior mastoid air cell opacification are noted. No skull fracture. Calcified atherosclerosis at the skullbase.  IMPRESSION: No evidence of acute intracranial abnormality.   Electronically Signed   By: Logan Bores M.D.   On: 06/06/2015 15:44    Scheduled Meds: . cefTAZidime (FORTAZ)  IV  1 g Intravenous Q12H  . cholecalciferol  5,000 Units Oral Daily  . feeding supplement (ENSURE ENLIVE)  237 mL Oral BID BM  . ferrous sulfate  325 mg Oral Daily  . hydroxyurea  1,000 mg Oral Daily  . levothyroxine  50 mcg  Oral QAC breakfast   Continuous Infusions:      Time spent: 25 minutes    Danie Diehl A  [facility] Pager [phone] If 7PM-7AM, please contact night-coverage at [email or web address], password [facility] 06/08/2015, 10:30 AM  LOS: 8 days

## 2015-06-08 NOTE — Plan of Care (Signed)
Problem: Phase II Progression Outcomes Goal: Vital signs remain stable Outcome: Progressing Afebrile at this time

## 2015-06-08 NOTE — Progress Notes (Signed)
Echocardiogram 2D Echocardiogram has been performed.  Jack Schultz 06/08/2015, 11:54 AM

## 2015-06-08 NOTE — Progress Notes (Addendum)
Physical Therapy Treatment Patient Details Name: Jack Schultz MRN: [id number] DOB: [birth year] Today's Date: 06/08/2015    History of Present Illness 81 yo male admitted with fever, sepsis, heart block with possible need for pacemaker. Hx of HTN, bil shoulder pain (chronic).  Pt with confusion, agitation, left facial droop 9/13, unable to tolerate MRI, neurology following.    PT Comments    Assisted to EOB. Only able to sit for a few minutes before MD entered and requested pt be returned to supine to be taken for imaging.   Follow Up Recommendations  SNF     Equipment Recommendations  Rolling walker with 5" wheels    Recommendations for Other Services       Precautions / Restrictions Precautions Precautions: Fall Restrictions Weight Bearing Restrictions: No    Mobility  Bed Mobility Overal bed mobility: Needs Assistance Bed Mobility: Supine to Sit;Sit to Supine     Supine to sit: HOB elevated;Mod assist Sit to supine: Mod assist   General bed mobility comments: Increased timed. Assist for trunk and bil LEs on today.   Transfers                 General transfer comment: NT-MD entered and asked further mobility be deferred at this time. Assisted pt back to bed.  Ambulation/Gait             General Gait Details: NT-see transfer section note   Stairs            Wheelchair Mobility    Modified Rankin (Stroke Patients Only)       Balance   Sitting-balance support: Bilateral upper extremity supported;Feet supported Sitting balance-Leahy Scale: Fair                              Cognition Arousal/Alertness: Awake/alert Behavior During Therapy: WFL for tasks assessed/performed Overall Cognitive Status: Within Functional Limits for tasks assessed                      Exercises      General Comments        Pertinent Vitals/Pain Pain Assessment: Faces Faces Pain Scale: Hurts little more Pain Location:  abdomen Pain Descriptors / Indicators: Tightness;Sore Pain Intervention(s): Monitored during session;Repositioned    Home Living                      Prior Function            PT Goals (current goals can now be found in the care plan section) Progress towards PT goals: Progressing toward goals    Frequency  Min 3X/week    PT Plan Current plan remains appropriate    Co-evaluation             End of Session   Activity Tolerance: Patient tolerated treatment well Patient left: in bed;with call bell/phone within reach;with family/visitor present;with bed alarm set     Time: 4081-4481 PT Time Calculation (min) (ACUTE ONLY): 10 min  Charges:  $Therapeutic Activity: 8-22 mins                    G Codes:      Weston Anna, MPT Pager: [phone]

## 2015-06-09 ENCOUNTER — Inpatient Hospital Stay (HOSPITAL_COMMUNITY): Payer: Medicare Other

## 2015-06-09 DIAGNOSIS — J984 Other disorders of lung: Secondary | ICD-10-CM

## 2015-06-09 LAB — CBC
HCT: 24.2 % — ABNORMAL LOW (ref 39.0–52.0)
HEMOGLOBIN: 7.7 g/dL — AB (ref 13.0–17.0)
MCH: 29.1 pg (ref 26.0–34.0)
MCHC: 31.8 g/dL (ref 30.0–36.0)
MCV: 91.3 fL (ref 78.0–100.0)
PLATELETS: 456 10*3/uL — AB (ref 150–400)
RBC: 2.65 MIL/uL — AB (ref 4.22–5.81)
RDW: 23.5 % — ABNORMAL HIGH (ref 11.5–15.5)
WBC: 25.9 10*3/uL — AB (ref 4.0–10.5)

## 2015-06-09 LAB — URINALYSIS, ROUTINE W REFLEX MICROSCOPIC
BILIRUBIN URINE: NEGATIVE
GLUCOSE, UA: NEGATIVE mg/dL
KETONES UR: NEGATIVE mg/dL
LEUKOCYTES UA: NEGATIVE
Nitrite: NEGATIVE
PH: 6 (ref 5.0–8.0)
PROTEIN: NEGATIVE mg/dL
Specific Gravity, Urine: 1.005 (ref 1.005–1.030)
Urobilinogen, UA: 1 mg/dL (ref 0.0–1.0)

## 2015-06-09 LAB — BASIC METABOLIC PANEL
ANION GAP: 9 (ref 5–15)
BUN: 56 mg/dL — AB (ref 6–20)
CHLORIDE: 97 mmol/L — AB (ref 101–111)
CO2: 23 mmol/L (ref 22–32)
Calcium: 7.7 mg/dL — ABNORMAL LOW (ref 8.9–10.3)
Creatinine, Ser: 2.63 mg/dL — ABNORMAL HIGH (ref 0.61–1.24)
GFR, EST AFRICAN AMERICAN: 23 mL/min — AB (ref >60)
GFR, EST NON AFRICAN AMERICAN: 20 mL/min — AB (ref >60)
Glucose, Bld: 97 mg/dL (ref 65–99)
POTASSIUM: 4.4 mmol/L (ref 3.5–5.1)
SODIUM: 129 mmol/L — AB (ref 135–145)

## 2015-06-09 LAB — URINE MICROSCOPIC-ADD ON

## 2015-06-09 LAB — PREPARE RBC (CROSSMATCH)

## 2015-06-09 MED ORDER — DEXTROSE 5 % IV SOLN
1.0000 g | INTRAVENOUS | Status: AC
  Administered 2015-06-10 – 2015-06-13 (×4): 1 g via INTRAVENOUS
  Filled 2015-06-09 (×5): qty 1

## 2015-06-09 MED ORDER — SODIUM CHLORIDE 0.9 % IV SOLN
INTRAVENOUS | Status: DC
  Administered 2015-06-09: 17:00:00 via INTRAVENOUS

## 2015-06-09 MED ORDER — FUROSEMIDE 10 MG/ML IJ SOLN
160.0000 mg | Freq: Once | INTRAVENOUS | Status: AC
  Administered 2015-06-09: 160 mg via INTRAVENOUS
  Filled 2015-06-09: qty 10

## 2015-06-09 MED ORDER — SODIUM CHLORIDE 0.9 % IV SOLN
Freq: Once | INTRAVENOUS | Status: AC
  Administered 2015-06-09: 13:00:00 via INTRAVENOUS

## 2015-06-09 NOTE — Consult Note (Signed)
ORTHOPAEDIC CONSULTATION  REQUESTING PHYSICIAN: Elmarie Shiley, MD  Chief Complaint: LUE pain  HPI: Jack Schultz is a 81 y.o. male who has had LUE pain more so in his shoulder for >40 years.  Patient knows that he has rotator cuff arthropathy and left wrist OA and left thumb basal joint arthritis.  Pain states the pain is waxing and waning that is worse with movement of the shoulder and wrist.  Pain is severe at worst and moderate at baseline.  Does not radiate.  Denies f/c/n/v related to the LUE pain.    Past Medical History  Diagnosis Date  . Hypertension   . Hyperlipidemia   . Pre-diabetes   . Thyroid disease   . Thrombocytosis   . GERD (gastroesophageal reflux disease)   . Vitamin D deficiency    History reviewed. No pertinent past surgical history. Social History   Social History  . Marital Status: Married    Spouse Name: N/A  . Number of Children: N/A  . Years of Education: N/A   Social History Main Topics  . Smoking status: Former Smoker    Quit date: 09/24/1975  . Smokeless tobacco: Current User  . Alcohol Use: No  . Drug Use: No  . Sexual Activity: No   Other Topics Concern  . None   Social History Narrative   Family History  Problem Relation Age of Onset  . Hypertension Father   . Heart disease Father    Allergies  Allergen Reactions  . Ace Inhibitors Cough   Prior to Admission medications   Medication Sig Start Date End Date Taking? Authorizing Provider  aspirin 81 MG tablet Take 81 mg by mouth daily.   Yes Historical Provider, MD  atenolol (TENORMIN) 50 MG tablet TAKE 1 TABLET AT BEDTIME Patient taking differently: TAKE 50 MG BY MOUTH AT BEDTIME 05/19/15  Yes Unk Pinto, MD  cephALEXin (KEFLEX) 500 MG capsule Take 1 capsule 4 x day with meals & bedtime for infection Patient taking differently: Take 500 mg by mouth 4 (four) times daily.  05/25/15 06/24/15 Yes Unk Pinto, MD  Cholecalciferol (VITAMIN D PO) Take 5,000 Units by mouth  daily.   Yes Historical Provider, MD  ferrous sulfate 325 (65 FE) MG tablet Take 325 mg by mouth daily with breakfast.   Yes Historical Provider, MD  furosemide (LASIX) 40 MG tablet TAKE 1 TABLET EVERY DAY Patient taking differently: TAKE 40 MG BY MOUTH EVERY DAY 05/27/15  Yes Unk Pinto, MD  hydroxyurea (HYDREA) 500 MG capsule TAKE ONE CAPSULE BY MOUTH EVERY DAY Patient taking differently: TAKE 500 MG BY MOUTH EVERY DAY 04/20/15  Yes Ladell Pier, MD  levothyroxine (SYNTHROID, LEVOTHROID) 50 MCG tablet TAKE 1 TABLET BY MOUTH EVERY DAY Patient taking differently: TAKE 50 MCG BY MOUTH DAILY. 02/06/15  Yes Vicie Mutters, PA-C   Ct Abdomen Pelvis Wo Contrast  06/08/2015   CLINICAL DATA:  Progressively getting weak for the past 4 weeks with poor by mouth intake and recurrent falls. Patient reports having frequent falls without any loss of consciousness or sustaining any injury, Family also reports patient having diffuse abdominal pain past week.  EXAM: CT ABDOMEN AND PELVIS WITHOUT CONTRAST  TECHNIQUE: Multidetector CT imaging of the abdomen and pelvis was performed following the standard protocol without IV contrast.  COMPARISON:  Chest x-ray 06/06/2015 and abdominal film 06/08/2015  FINDINGS: Lung bases demonstrate moderate bilateral pleural effusions with associated compressive lower lobe atelectasis. Subtle hazy airspace density over the most  superior image in the region of the lingula as cannot exclude infection. There is cardiomegaly with a small pericardial effusion  Abdominal images demonstrate moderate splenomegaly. The enlarged spleen displaces the left kidney anterior medially.  The liver, pancreas, gallbladder and adrenal glands are within normal. Kidneys are normal in size without hydronephrosis or nephrolithiasis. No focal renal mass. Ureters are normal. Appendix is normal.  There is mild calcified plaque over the abdominal aorta and iliac arteries.  Very small amount of free fluid in the  pericolic gutters and just above the bladder.  Small bowel is within normal. There is moderate fecal retention over the rectum as the colon is otherwise within normal.  Pelvic images demonstrate the bladder and prostate to be within normal.  There is mild subcutaneous edema over the lateral and dependent abdomen/pelvis.  There is a focus of heterotopic bone versus myositis ossificans over the left adductor muscle group as well as anterior right hip musculature. There are moderate degenerative changes throughout the spine with multilevel disc disease throughout the lumbar spine. Several vertebral body hemangiomas. There mild degenerate changes of the hips.  IMPRESSION: No acute findings in the abdomen/ pelvis.  Moderate fecal retention over the rectum without evidence of obstruction.  Moderate splenomegaly which displaces the kidney anterior medially.  Moderate size bilateral pleural effusions with associated compressive atelectasis in the lower lobes. Possible airspace process over the most superior image over the lingula as cannot exclude infection  Cardiomegaly with small pericardial effusion.   Electronically Signed   By: Marin Olp M.D.   On: 06/08/2015 16:12   Dg Abd Decub  06/08/2015   CLINICAL DATA:  Evaluate for free intraperitoneal air. Abdominal distension.  EXAM: ABDOMEN - 1 VIEW DECUBITUS  COMPARISON:  Chest radiograph 06/06/2015  FINDINGS: Single decubitus view demonstrates no radiographic evidence free intraperitoneal air. Scattered dilated loops of bowel are demonstrated through the central abdomen. Extensive lower thoracic lumbar spine degenerative changes. Heterogeneous opacities within the lung bases.  IMPRESSION: No evidence for free intraperitoneal air.   Electronically Signed   By: Lovey Newcomer M.D.   On: 06/08/2015 12:43    Positive ROS: All other systems have been reviewed and were otherwise negative with the exception of those mentioned in the HPI and as above.  Physical  Exam: General: Alert, no acute distress Cardiovascular: No pedal edema Respiratory: No cyanosis, no use of accessory musculature GI: No organomegaly, abdomen is soft and non-tender Skin: No lesions in the area of chief complaint Neurologic: Sensation intact distally Psychiatric: Patient is competent for consent with normal mood and affect Lymphatic: No axillary or cervical lymphadenopathy  MUSCULOSKELETAL:  - LUE without significant swelling, no redness, no cellulitis, no fluctuance - good motion of the wrist - poor active motion of the shoulder at baseline 2/2 rotator cuff arthropathy - NVI distally   Assessment: 1. L shoulder rotator cuff arthropathy 2. L wrist OA 3. L basal joint arthritis  Plan: - patient is at baseline with his LUE pain - no indication of infection or source of his fever from his LUE - ortho signed off  Thank you for the consult and the opportunity to see Jack Schultz  N. Eduard Roux, MD [place] 2:48 PM

## 2015-06-09 NOTE — Progress Notes (Signed)
PT Cancellation Note  Patient Details Name: Jack Schultz MRN: [id number] DOB: 12/16/1923   Cancelled Treatment:    Reason Eval/Treat Not Completed: Patient at procedure or test/unavailable. Will check back another day.    Weston Anna, MPT Pager: [phone]

## 2015-06-09 NOTE — Consult Note (Signed)
81 year old male with history of hypertension, hyperlipidemia, thrombocytosis on hydroxyurea, recurrent epistaxis for to be related to myeloproliferative disorder and aspirin therapy hypothyroidism, iron deficiency anemia and GERD brought to the ED last week by family as patient had weakness for 4 weeks PTA. Patient reported having frequent falls without any loss of consciousness or sustaining any injury. He was seen by his PCP on 9/1 and was found to have cellulitis of his distal left forearm and prescribed a 2 weeks course of Keflex. Family also reports patient having off-and-on diffuse abdominal pain past 1 week. There was diminished PO intake. No associated nausea and vomiting or bowel symptoms.  In the ED patient blood pressure was low at 94/48 mmHg O2 sat of 89% on room air. Blood revealed a WBC of 17.6, hemoglobin of 7.4 (baseline hemoglobin of 9-10), clumped platelets. Chemistry revealed a sodium of 129, potassium 5.4, chloride of 97, BUN of 42 and creatinine 1.48. Glucose 119. Chest x-ray and UA were negative. Pt received 3 units of PRBCs, BP meds were held, and he had fever and worsening azotemia with creat rising to 2.74m/dl and BUN of 56.  CXR has worsened with interstitial edema and bilat pl effusions, Echo shows a vigorous LV cavity and EF 65-70% and trivial effusion. He has had afib and flutter with RVR.  No ACE-I.  Not sure about NSAIDs prior to admission. There is an anteriorly displaced left kidney due to splenomegaly.    Past Medical History  Diagnosis Date  . Hypertension   . Hyperlipidemia   . Pre-diabetes   . Thyroid disease   . Thrombocytosis   . GERD (gastroesophageal reflux disease)   . Vitamin D deficiency    History reviewed. No pertinent past surgical history. Social History:  reports that he quit smoking about 39 years ago. He uses smokeless tobacco. He reports that he does not drink alcohol or use illicit drugs. Allergies:  Allergies  Allergen Reactions  . Ace  Inhibitors Cough   Family History  Problem Relation Age of Onset  . Hypertension Father   . Heart disease Father     Medications:  Scheduled: . [START ON 06/10/2015] cefTAZidime (FORTAZ)  IV  1 g Intravenous Q24H  . cholecalciferol  5,000 Units Oral Daily  . feeding supplement (ENSURE ENLIVE)  237 mL Oral BID BM  . ferrous sulfate  325 mg Oral Daily  . hydroxyurea  1,000 mg Oral Daily  . levothyroxine  50 mcg Oral QAC breakfast  . polyethylene glycol  17 g Oral BID     ROS: as per HPI  Blood pressure 121/45, pulse 71, temperature 98.1 F (36.7 C), temperature source Oral, resp. rate 16, height 5' 9" (1.753 m), weight 79.5 kg (175 lb 4.3 oz), SpO2 96 %.  General appearance: alert, cooperative and appears stated age Head: Normocephalic, without obvious abnormality, atraumatic Nose: Nares normal. Septum midline. Mucosa normal. No drainage or sinus tenderness. Throat: lips, mucosa, and tongue normal; teeth and gums normal Resp: diminished breath sounds bibasilar and dullness to percussion bilaterally Chest wall: no tenderness Cardio: regular rate and rhythm GI: soft, non-tender; bowel sounds normal; no masses,  no organomegaly Extremities: edema 1+ pedal Skin: Skin color, texture, turgor normal. No rashes or lesions Neurologic: Grossly normal Results for orders placed or performed during the hospital encounter of 05/31/15 (from the past 48 hour(s))  CBC with Differential/Platelet     Status: Abnormal   Collection Time: 06/08/15  8:11 AM  Result Value Ref Range  WBC 36.9 (H) 4.0 - 10.5 K/uL   RBC 2.75 (L) 4.22 - 5.81 MIL/uL   Hemoglobin 8.0 (L) 13.0 - 17.0 g/dL   HCT 25.4 (L) 39.0 - 52.0 %   MCV 92.4 78.0 - 100.0 fL   MCH 29.1 26.0 - 34.0 pg   MCHC 31.5 30.0 - 36.0 g/dL   RDW 23.3 (H) 11.5 - 15.5 %   Platelets 486 (H) 150 - 400 K/uL   Neutrophils Relative % 94 %   Lymphocytes Relative 2 %   Monocytes Relative 2 %   Eosinophils Relative 0 %   Basophils Relative 0 %    Band Neutrophils 0 %   Metamyelocytes Relative 2 %   Myelocytes 0 %   Promyelocytes Absolute 0 %   Blasts 0 %   nRBC 0 0 /100 WBC   Other 0 %   Neutro Abs 35.5 (H) 1.7 - 7.7 K/uL   Lymphs Abs 0.7 0.7 - 4.0 K/uL   Monocytes Absolute 0.7 0.1 - 1.0 K/uL   Eosinophils Absolute 0.0 0.0 - 0.7 K/uL   Basophils Absolute 0.0 0.0 - 0.1 K/uL   RBC Morphology POLYCHROMASIA PRESENT     Comment: ELLIPTOCYTES   WBC Morphology MILD LEFT SHIFT (1-5% METAS, OCC MYELO, OCC BANDS)   Basic metabolic panel     Status: Abnormal   Collection Time: 06/08/15  8:11 AM  Result Value Ref Range   Sodium 130 (L) 135 - 145 mmol/L   Potassium 4.2 3.5 - 5.1 mmol/L   Chloride 98 (L) 101 - 111 mmol/L   CO2 25 22 - 32 mmol/L   Glucose, Bld 105 (H) 65 - 99 mg/dL   BUN 46 (H) 6 - 20 mg/dL   Creatinine, Ser 2.12 (H) 0.61 - 1.24 mg/dL   Calcium 8.0 (L) 8.9 - 10.3 mg/dL   GFR calc non Af Amer 26 (L) >60 mL/min   GFR calc Af Amer 30 (L) >60 mL/min    Comment: (NOTE) The eGFR has been calculated using the CKD EPI equation. This calculation has not been validated in all clinical situations. eGFR's persistently <60 mL/min signify possible Chronic Kidney Disease.    Anion gap 7 5 - 15  CBC     Status: Abnormal   Collection Time: 06/09/15  5:04 AM  Result Value Ref Range   WBC 25.9 (H) 4.0 - 10.5 K/uL   RBC 2.65 (L) 4.22 - 5.81 MIL/uL   Hemoglobin 7.7 (L) 13.0 - 17.0 g/dL   HCT 24.2 (L) 39.0 - 52.0 %   MCV 91.3 78.0 - 100.0 fL   MCH 29.1 26.0 - 34.0 pg   MCHC 31.8 30.0 - 36.0 g/dL   RDW 23.5 (H) 11.5 - 15.5 %   Platelets 456 (H) 150 - 400 K/uL  Basic metabolic panel     Status: Abnormal   Collection Time: 06/09/15  5:04 AM  Result Value Ref Range   Sodium 129 (L) 135 - 145 mmol/L   Potassium 4.4 3.5 - 5.1 mmol/L   Chloride 97 (L) 101 - 111 mmol/L   CO2 23 22 - 32 mmol/L   Glucose, Bld 97 65 - 99 mg/dL   BUN 56 (H) 6 - 20 mg/dL   Creatinine, Ser 2.63 (H) 0.61 - 1.24 mg/dL   Calcium 7.7 (L) 8.9 - 10.3 mg/dL    GFR calc non Af Amer 20 (L) >60 mL/min   GFR calc Af Amer 23 (L) >60 mL/min    Comment: (NOTE) The  eGFR has been calculated using the CKD EPI equation. This calculation has not been validated in all clinical situations. eGFR's persistently <60 mL/min signify possible Chronic Kidney Disease.    Anion gap 9 5 - 15  Prepare RBC     Status: None   Collection Time: 06/09/15 10:30 AM  Result Value Ref Range   Order Confirmation ORDER PROCESSED BY BLOOD BANK   Type and screen     Status: None (Preliminary result)   Collection Time: 06/09/15 10:50 AM  Result Value Ref Range   ABO/RH(D) B NEG    Antibody Screen NEG    Sample Expiration 06/12/2015    Unit Number [id number]    Blood Component Type RED CELLS,LR    Unit division 00    Status of Unit ISSUED    Transfusion Status OK TO TRANSFUSE    Crossmatch Result Compatible    Ct Abdomen Pelvis Wo Contrast  06/08/2015   CLINICAL DATA:  Progressively getting weak for the past 4 weeks with poor by mouth intake and recurrent falls. Patient reports having frequent falls without any loss of consciousness or sustaining any injury, Family also reports patient having diffuse abdominal pain past week.  EXAM: CT ABDOMEN AND PELVIS WITHOUT CONTRAST  TECHNIQUE: Multidetector CT imaging of the abdomen and pelvis was performed following the standard protocol without IV contrast.  COMPARISON:  Chest x-ray 06/06/2015 and abdominal film 06/08/2015  FINDINGS: Lung bases demonstrate moderate bilateral pleural effusions with associated compressive lower lobe atelectasis. Subtle hazy airspace density over the most superior image in the region of the lingula as cannot exclude infection. There is cardiomegaly with a small pericardial effusion  Abdominal images demonstrate moderate splenomegaly. The enlarged spleen displaces the left kidney anterior medially.  The liver, pancreas, gallbladder and adrenal glands are within normal. Kidneys are normal in size without  hydronephrosis or nephrolithiasis. No focal renal mass. Ureters are normal. Appendix is normal.  There is mild calcified plaque over the abdominal aorta and iliac arteries.  Very small amount of free fluid in the pericolic gutters and just above the bladder.  Small bowel is within normal. There is moderate fecal retention over the rectum as the colon is otherwise within normal.  Pelvic images demonstrate the bladder and prostate to be within normal.  There is mild subcutaneous edema over the lateral and dependent abdomen/pelvis.  There is a focus of heterotopic bone versus myositis ossificans over the left adductor muscle group as well as anterior right hip musculature. There are moderate degenerative changes throughout the spine with multilevel disc disease throughout the lumbar spine. Several vertebral body hemangiomas. There mild degenerate changes of the hips.  IMPRESSION: No acute findings in the abdomen/ pelvis.  Moderate fecal retention over the rectum without evidence of obstruction.  Moderate splenomegaly which displaces the kidney anterior medially.  Moderate size bilateral pleural effusions with associated compressive atelectasis in the lower lobes. Possible airspace process over the most superior image over the lingula as cannot exclude infection  Cardiomegaly with small pericardial effusion.   Electronically Signed   By: Marin Olp M.D.   On: 06/08/2015 16:12   Dg Chest 2 View  06/09/2015   CLINICAL DATA:  Cough and shortness of breath.  Acute kidney injury.  EXAM: CHEST  2 VIEW  COMPARISON:  06/06/2015, 05/31/2015.  FINDINGS: Cardiac silhouette markedly enlarged, unchanged. Interval worsening of interstitial and perihilar airspace pulmonary edema. Large bilateral pleural effusions, increased in size. Dense consolidation in the lower lobes, worse than on  the prior examinations. Degenerative changes throughout the thoracic and visualized upper lumbar spine. Severe degenerative changes in the  shoulders.  IMPRESSION: 1. Worsening interstitial and airspace pulmonary edema since the examination 3 days ago. 2. Large bilateral pleural effusions, increased in size since the examination 3 days ago. 3. Dense passive atelectasis in the lower lobes (favored over pneumonia) with worsening aeration.   Electronically Signed   By: Evangeline Dakin M.D.   On: 06/09/2015 15:46   Dg Abd Decub  06/08/2015   CLINICAL DATA:  Evaluate for free intraperitoneal air. Abdominal distension.  EXAM: ABDOMEN - 1 VIEW DECUBITUS  COMPARISON:  Chest radiograph 06/06/2015  FINDINGS: Single decubitus view demonstrates no radiographic evidence free intraperitoneal air. Scattered dilated loops of bowel are demonstrated through the central abdomen. Extensive lower thoracic lumbar spine degenerative changes. Heterogeneous opacities within the lung bases.  IMPRESSION: No evidence for free intraperitoneal air.   Electronically Signed   By: Lovey Newcomer M.D.   On: 06/08/2015 12:43    Assessment:  1 AKI, hemodynamically mediated due to low BP, arrhythmia, anemia, BP meds 2 ?PNA v CHF 3 Anemia 4 Myeloproliferative Syndrome Plan: 1 One dose lasix given tonight, but exam and echo not suggestive of overload/CHF 2 May need thoracentesis to help with fever w/u,etc  POWELL,ALVIN C 06/09/2015, 10:07 PM

## 2015-06-09 NOTE — Progress Notes (Signed)
Speech Language Pathology Treatment: Dysphagia  Patient Details Name: Jack Schultz MRN: [id number] DOB: Feb 07, 1924 Today's Date: 06/09/2015 Time: 7353-2992 SLP Time Calculation (min) (ACUTE ONLY): 23 min  Assessment / Plan / Recommendation Clinical Impression  Pt with much improved mental and respiratory status today then when SLP briefly saw him two days prior (immediately before EEG).  RN reports pt with good po tolerance.    Pt reports his breathing to be much improved but not quite at baseline.  Daughter in law present denies that pt had a facial droop during hospital stay.  Pt was consuming a Sugar Daddy *caramel candy* upon SLP entrance to room therefore his ability to verbalize was limited.  Good tolerance of water and Sugar Daddy consumption noted.  Cough x1 when pt was trying to talk with secretions from candy in his oral cavity.  Pt denies coughing with intake during hospital stay.  Suspect he is compensating well.    Pt does report chronic issues with occasionally clearing his throat and coughing with intake "for years" that is not worse currently.  He also states at times he has to lean over when he has reflux at home at times but denies "choking" on reflux or expectoration.      Reviewed reflux/aspiration precautions and mitigation strategies to compensate for his chronic dysphagia.  Recommend continue diet as tolerated.        HPI Other Pertinent Information: 81 yo male admitted with fever, sepsis, heart block with possible need for pacemaker. Hx of HTN, bil shoulder pain (chronic). Pt with confusion, agitation, left facial droop 9/13, unable to tolerate MRI, neurology following.   Pertinent Vitals Pain Assessment: No/denies pain  SLP Plan  All goals met    Recommendations Diet recommendations: Regular;Thin liquid Liquids provided via: Cup;Straw Medication Administration: Whole meds with liquid Supervision: Patient able to self feed Compensations:  (start meals with  liquids, follow solids with liquids, cease intake if coughing or short of breath) Postural Changes and/or Swallow Maneuvers: Seated upright 90 degrees;Upright 30-60 min after meal              Follow up Recommendations: None Plan: All goals met    [place], [place] [facility] SLP [phone]

## 2015-06-09 NOTE — Plan of Care (Signed)
Problem: Phase III Progression Outcomes Goal: Activity at appropriate level-compared to baseline (UP IN CHAIR FOR HEMODIALYSIS)  Outcome: Progressing Able to get up to [facility] and chair

## 2015-06-09 NOTE — Progress Notes (Signed)
[facility] PROGRESS NOTE  Jack Schultz STM:[id number] DOB: 1923/12/13 DOA: 05/31/2015 PCP: [name], MD   Brief narrative 81 year old male with history of hypertension, hyperlipidemia, thrombocytosis (on hydroxyurea, sees Dr. [name]), recurrent epistaxis for to be related to myeloproliferative disorder and aspirin therapy hypothyroidism, iron deficiency anemia and GERD brought to the ED by family as patient has been progressively getting weak for the past 4 weeks with poor by mouth intake and recurrent falls. Patient reports having frequent falls without any loss of consciousness or sustaining any injury. He was seen by his PCP on 9/1 days ago and was found to have cellulitis of his distal left forearm and prescribed a 2 weeks course of Keflex. He reports that since then he has been having pain in his left arm and shoulder. Family also reports patient having off-and-on diffuse abdominal pain past 1 week. No associated nausea and vomiting or bowel symptoms. Patient denies headache, dizziness, fever, chills, nausea , vomiting, chest pain, palpitations, SOB, abdominal pain, bowel or urinary symptoms. Denies change in weight but has poor appetite for last 3 weeks.  Course in the ED Patient blood pressure was low normal at 94/48 mmHg O2 sat of 89% on room air. Blood will done showed significant leukocytosis with WBC of 17.6, hemoglobin of 7.4 (baseline hemoglobin of 9-10), clumped platelets. Chemistry showed sodium of 129, potassium 5.4, chloride of 97, BUN of 42 and creatinine 1.48. Glucose 119. Chest x-ray and UA were unremarkable. Hospitalist admission requested for further management   Assessment/Plan: Sepsis Sepsis pathway initiated on admission. Blood cultures negative. 2-D echo does not comment on possible vegetations. Elevated ESR and CRP. Given diffuse ST elevation with pericardia effusion on echo acute myopericarditis is of concern.  -X-ray of the left shoulder and  wrist negative for effusion. -Appreciate ID recommendations.  -started ceftazidime 9-14 , discussed with Dr. Megan Salon.  -Continue with ceftazidime,  -ortho consulted due to FUO, and left hand and shoulder pain. Does patient needs MRI.   Acute encephalopathy; Related to fever, medications.  Patient was agitated . He received ativan and haldol at 2 am and 4 am.  Improved.   Respiratory failure; Hypoxemia:  Received a dose of lasix 9-12, 9-13 Chest x ray with pleural effusion.  Holding lasix due to worsening renal function.   Acute kidney injury, hyponatremia -Possibly due to dehydration and sepsis initially. Avoid nephrotoxins. .  Repeat UA negative for infection. . Repeat renal US 9-11 bordeline increased cortical diseases.  -Renal function got worse 9-15. He received transiently fluids 9-15. We have to be careful with IV fluids due to pleural effusion. He will received today PRBC to see if this will help with perfusion.  -Discussed with Dr Jana Hakim, can continue with hydroxyurea.   -Hold diuretics. If renal function no better tomorrow will repeat US, CT showed spleen is displacing right kidney.   Essential thrombocytosis, Leukocytosis/  -On hydroxyurea as outpatient. (Sees Dr. [name]). Has history of recurrent epistaxis suspected due to myeloproliferative disorder and aspirin therapy. -Patient with persistent leukocytosis. In reviewing records, he had in peripheral smear blast Metas and myelos labs 04-25-2015.  -Consulted hematology due to leukocytoses and history of myeloproliferative diseases.  -LDH 184, peripheral smear review by hematology no significant left shift.  -started on Agrylin 9-12. Discontinue due to rash. Management per oncologist  -follow WBC trend.  -Hydroxyurea resume 9-14. Discussed with Dr Jana Hakim, he doesn't think that hydroxyurea is affecting renal function.  -WBC trending down.  -Discussed with Dr Jana Hakim, he recommend  BM biopsy. Daughter would like to speak  with [facility]. They will let me know.   Abdominal distension; secondary to  CT abdomen and pelvis no acute abnormalities, no abscess, no perforation. Splenomegaly displacing right kidney.  Had BM 9-15. Continue with miralax.   Left hand and shoulder pain X-rays unremarkable. Has limited mobility of the left arm due to shoulder pain.  uric acid at 7, normal  Resume antibiotics.  Ortho consulted.   Complete heart block Continue to monitor on telemetry. Discontinued metoprolol. Heart rate maintained in the 70s-80s. Cardiology following.  2-D echo with normal EF. No sings of  possible vegetation. Has trivial pericardial effusion. Ideally would need a pacemaker but not plan for now given underlying sepsis.  Elevated troponin  EKG done showed ST changes in inferior leads (? ST elevation). Troponin of 0.08 x 3. No chest pain.  Possibly due to  myopericarditis.   Acute pericarditis/ myopericarditis - Suspicion given diffuse ST elevation, trivial pericardial effusion on echo and underlying sepsis. However given his anemia will avoid high dose of aspirin and given his acute kidney injury cannot use NSAIDs or colchicine. -denies chest pain.    Hypotension Secondary to sepsis and dehydration. Discontinued fluids   Symptomatic anemia Baseline hemoglobin of around 9. Hemoglobin of 7.2 upon presentation. Received 2 units PRBC. Improved posttransfusion.. Stool for occult blood negative. TSH and B12 normal.  Generalized weakness with frequent falls Appears to be multifactorial with sepsis, anemia and dehydration. A.m. cortisol normal. Normal TSH and B12. Need SNF at discharge   Abdominal pain Resolved. Abdominal ultrasound unremarkable. Showed splenomegaly.   hypothyroidism TSH is normal. Continue Synthroid  Prolonged Qtc (530)  Normal K and magnesium. Monitor on telemetry.   DVT prophylaxis: SCDs  Diet: Regular   Code Status: Full code Family Communication: Discussed with   daughter at bedside Disposition Plan: remain inpatient, work up for FUO...   Consultants:  Cardiology  Infectious disease  Procedures:  CT head  Ultrasound abdomen  2-D echo   Antibiotics:  IV Zosyn 9/ 7-9/8  IV 9/14 Ceftazidime  HPI/Subjective: He is alert, answering questions. Denies dyspnea. Denies abdominal pain.  He had BM last night.   Objective: Filed Vitals:   06/09/15 0552  BP: 122/52  Pulse: 86  Temp: 98.7 F (37.1 C)  Resp: 18    Intake/Output Summary (Last 24 hours) at 06/09/15 1012 Last data filed at 06/09/15 0901  Gross per 24 hour  Intake    455 ml  Output    500 ml  Net    -45 ml   Filed Weights   05/31/15 1710 06/03/15 0400 06/06/15 0435  Weight: 77.6 kg (171 lb 1.2 oz) 84.4 kg (186 lb 1.1 oz) 79.5 kg (175 lb 4.3 oz)    Exam:   General:   in no acute distress  HEENT:  moist oral mucosa, supple neck  Chest: no significant crackles.    CVS: Normal S1 and S2, no murmurs rub or gallop  GI: Soft, BS present, abdomen less distended, softer, NT  Musculoskeletal: Warm, edema, tender to pressure over left wrist, left arm and shoulder ( acute on chronic)  CNS: alert in no distress   Data Reviewed: Basic Metabolic Panel:  Recent Labs Lab 06/04/15 0910 06/05/15 0443 06/06/15 0512 06/06/15 1120 06/07/15 0608 06/08/15 0811 06/09/15 0504  NA  --  133* 135  --  134* 130* 129*  K  --  4.1 4.4  --  3.8 4.2 4.4  CL  --  104 103  --  102 98* 97*  CO2  --  23 24  --  $R'24 25 23  'yM$ GLUCOSE  --  105* 120*  --  98 105* 97  BUN  --  55* 42*  --  35* 46* 56*  CREATININE  --  1.58* 1.32*  --  1.48* 2.12* 2.63*  CALCIUM  --  8.3* 8.6*  --  8.3* 8.0* 7.7*  MG 2.5*  --   --  2.1  --   --   --    Liver Function Tests: No results for input(s): AST, ALT, ALKPHOS, BILITOT, PROT, ALBUMIN in the last 168 hours. No results for input(s): LIPASE, AMYLASE in the last 168 hours.  Recent Labs Lab 06/06/15 1120  AMMONIA 17   CBC:  Recent  Labs Lab 06/05/15 1141 06/06/15 0512 06/07/15 0608 06/08/15 0811 06/09/15 0504  WBC 21.5* 28.2* 37.5* 36.9* 25.9*  NEUTROABS 19.4*  --   --  35.5*  --   HGB 8.4* 9.0* 8.1* 8.0* 7.7*  HCT 27.0* 28.8* 26.2* 25.4* 24.2*  MCV 94.4 94.4 93.2 92.4 91.3  PLT 554* 670* 600* 486* 456*   Cardiac Enzymes:  Recent Labs Lab 06/02/15 1341  TROPONINI 0.08*   BNP (last 3 results)  Recent Labs  06/06/15 0512  BNP 425.4*    ProBNP (last 3 results) No results for input(s): PROBNP in the last 8760 hours.  CBG:  Recent Labs Lab 06/03/15 0243 06/06/15 1157  GLUCAP 156* 130*    Recent Results (from the past 240 hour(s))  Culture, blood (routine x 2)     Status: None   Collection Time: 05/31/15  3:40 PM  Result Value Ref Range Status   Specimen Description BLOOD LEFT ANTECUBITAL  Final   Special Requests BOTTLES DRAWN AEROBIC AND ANAEROBIC 5CC  Final   Culture   Final    NO GROWTH 5 DAYS Performed at [facility]    Report Status 06/05/2015 FINAL  Final  Culture, blood (routine x 2)     Status: None   Collection Time: 05/31/15  3:45 PM  Result Value Ref Range Status   Specimen Description BLOOD BLOOD RIGHT HAND  Final   Special Requests BOTTLES DRAWN AEROBIC AND ANAEROBIC 5CC  Final   Culture   Final    NO GROWTH 5 DAYS Performed at [facility]    Report Status 06/05/2015 FINAL  Final  MRSA PCR Screening     Status: None   Collection Time: 05/31/15  6:55 PM  Result Value Ref Range Status   MRSA by PCR NEGATIVE NEGATIVE Final    Comment:        The GeneXpert MRSA Assay (FDA approved for NASAL specimens only), is one component of a comprehensive MRSA colonization surveillance program. It is not intended to diagnose MRSA infection nor to guide or monitor treatment for MRSA infections.   Culture, blood (routine x 2)     Status: None (Preliminary result)   Collection Time: 06/06/15 11:20 AM  Result Value Ref Range Status   Specimen Description BLOOD  RIGHT ARM  Final   Special Requests BOTTLES DRAWN AEROBIC AND ANAEROBIC 8CC  Final   Culture   Final    NO GROWTH 2 DAYS Performed at [facility]    Report Status PENDING  Incomplete  Culture, blood (routine x 2)     Status: None (Preliminary result)   Collection Time: 06/06/15 11:28 AM  Result Value Ref Range Status  Specimen Description BLOOD LEFT ARM  Final   Special Requests BOTTLES DRAWN AEROBIC AND ANAEROBIC 10CC  Final   Culture   Final    NO GROWTH 2 DAYS Performed at [facility]    Report Status PENDING  Incomplete     Studies: Ct Abdomen Pelvis Wo Contrast  06/08/2015   CLINICAL DATA:  Progressively getting weak for the past 4 weeks with poor by mouth intake and recurrent falls. Patient reports having frequent falls without any loss of consciousness or sustaining any injury, Family also reports patient having diffuse abdominal pain past week.  EXAM: CT ABDOMEN AND PELVIS WITHOUT CONTRAST  TECHNIQUE: Multidetector CT imaging of the abdomen and pelvis was performed following the standard protocol without IV contrast.  COMPARISON:  Chest x-ray 06/06/2015 and abdominal film 06/08/2015  FINDINGS: Lung bases demonstrate moderate bilateral pleural effusions with associated compressive lower lobe atelectasis. Subtle hazy airspace density over the most superior image in the region of the lingula as cannot exclude infection. There is cardiomegaly with a small pericardial effusion  Abdominal images demonstrate moderate splenomegaly. The enlarged spleen displaces the left kidney anterior medially.  The liver, pancreas, gallbladder and adrenal glands are within normal. Kidneys are normal in size without hydronephrosis or nephrolithiasis. No focal renal mass. Ureters are normal. Appendix is normal.  There is mild calcified plaque over the abdominal aorta and iliac arteries.  Very small amount of free fluid in the pericolic gutters and just above the bladder.  Small bowel is within  normal. There is moderate fecal retention over the rectum as the colon is otherwise within normal.  Pelvic images demonstrate the bladder and prostate to be within normal.  There is mild subcutaneous edema over the lateral and dependent abdomen/pelvis.  There is a focus of heterotopic bone versus myositis ossificans over the left adductor muscle group as well as anterior right hip musculature. There are moderate degenerative changes throughout the spine with multilevel disc disease throughout the lumbar spine. Several vertebral body hemangiomas. There mild degenerate changes of the hips.  IMPRESSION: No acute findings in the abdomen/ pelvis.  Moderate fecal retention over the rectum without evidence of obstruction.  Moderate splenomegaly which displaces the kidney anterior medially.  Moderate size bilateral pleural effusions with associated compressive atelectasis in the lower lobes. Possible airspace process over the most superior image over the lingula as cannot exclude infection  Cardiomegaly with small pericardial effusion.   Electronically Signed   By: Marin Olp M.D.   On: 06/08/2015 16:12   Dg Abd Decub  06/08/2015   CLINICAL DATA:  Evaluate for free intraperitoneal air. Abdominal distension.  EXAM: ABDOMEN - 1 VIEW DECUBITUS  COMPARISON:  Chest radiograph 06/06/2015  FINDINGS: Single decubitus view demonstrates no radiographic evidence free intraperitoneal air. Scattered dilated loops of bowel are demonstrated through the central abdomen. Extensive lower thoracic lumbar spine degenerative changes. Heterogeneous opacities within the lung bases.  IMPRESSION: No evidence for free intraperitoneal air.   Electronically Signed   By: Lovey Newcomer M.D.   On: 06/08/2015 12:43    Scheduled Meds: . sodium chloride   Intravenous Once  . cefTAZidime (FORTAZ)  IV  1 g Intravenous Q12H  . cholecalciferol  5,000 Units Oral Daily  . feeding supplement (ENSURE ENLIVE)  237 mL Oral BID BM  . ferrous sulfate  325  mg Oral Daily  . hydroxyurea  1,000 mg Oral Daily  . levothyroxine  50 mcg Oral QAC breakfast  . polyethylene glycol  17  g Oral BID   Continuous Infusions:      Time spent: 25 minutes    Regalado, Belkys A  [facility] Pager [phone] If 7PM-7AM, please contact night-coverage at [email or web address], password [facility] 06/09/2015, 10:12 AM  LOS: 9 days

## 2015-06-09 NOTE — Progress Notes (Signed)
I was asked by the hospitalist to contact the patient's daughter in law. I called and discussed the case with her. She was concerned the patient's myeloproliferative syndrome may be transforming into a myelodysplasia or leukemia. The family has extensive experience with MPS and leukemias--one of the patient's son has PVera, patient's mother had leukemia. I explained a bone marrow biopsy would be the only way to confirm this one way or the other. However, with very elderly patients if we do find a leukemia or MDS there is little we can do and frequently at that point we make a Hospice referral. She then said we should cancel the BMBX and discuss this with the family. She also told me this family "does not talk about these things."  Accordingly I met with Jack Schultz this morning. His daughter, niece, and niece's husband were present. I explained that he seems to have been declining for some time, that his bone marrow is making more white cells and fewer red cells, and that I am concerned his MPS may be transforming into something more aggressive. I explained that a bone marrow biopsy would be the way to tell what is going on, but that if we did document a leukemia there would be little we could do. I also said we are concerned his kidneys seem to be doing worse as is his anemia.  I asked Jack Schultz for his thoughts on advanced directives. He has apparently never had a discussion about this--it appears to be a topic that has been actively avoided. I asked if his heart were to stop at some point whether he would want [place] to do CPR and be intubated and moved to an ICU, and he could not answer yes or no. I suggested a Palliative Care consult could be helpful in discussing this further and trying to reach clarity.  However the patient's daughter feels this is something to be discussed in family--the patient, patient's wife, and children. They do not want anyone else involved in the discussion. They also do not feel  that discussion is going to take place "today or tomorrow, but maybe Sunday." They will discuss this with Dr Benay Spice when he returns Monday.  The plan then is to  (a) cancel plans for BMBX and for palliative consult (b) titrate hydrea-- I will follow patient's counts daily and order as appropriate (c) transfuse as needed (d) resume IVF low dose and consult renal re optimizing patient's renal function  I will let Dr Benay Spice know re plans and family will let him know their thoughts when he returns Monday.  Appreciate your help with this patient!

## 2015-06-09 NOTE — Progress Notes (Signed)
Taking on care from last RN. I agree with the previous RN assessment and will continue to monitor.

## 2015-06-09 NOTE — Care Management Important Message (Signed)
Important Message  Patient Details  Name: VARNELL ORVIS MRN: [id number] Date of Birth: 06/23/1924   Medicare Important Message Given:  Yes-third notification given    Camillo Flaming 06/09/2015, 12:00 [place] Message  Patient Details  Name: CORLISS LAMARTINA MRN: [id number] Date of Birth: 08-Jan-1924   Medicare Important Message Given:  Yes-third notification given    Camillo Flaming 06/09/2015, 12:00 PM

## 2015-06-09 NOTE — Progress Notes (Signed)
CSW continuing to follow.  Pt and pt family planning for [facility] when pt medically ready for discharge.  CSW spoke with MD who states that pt not yet medically ready for discharge and MD does not anticipate discharge over the weekend.   CSW updated [facility] and sent facility updated clinicals.   CSW to continue to follow.  Alison Murray, MSW, [place] [employer] [phone]

## 2015-06-09 NOTE — Progress Notes (Signed)
Jack Schultz WBC is decreasing, likely doe to hydrea +/- resolution of his infection/inflammation.   Continue hydrea at 1 g/d until WBC <20K, then drop to 500 mg/d  Will follow with you (am on call this weekend)_

## 2015-06-09 NOTE — Progress Notes (Signed)
ANTIBIOTIC CONSULT NOTE   Pharmacy Consult for Ceftazidime Indication: cellulitis vs pericarditis vs PNA  Allergies  Allergen Reactions  . Ace Inhibitors Cough    Patient Measurements: Height: 5\' 9"  (175.3 cm) Weight: 175 lb 4.3 oz (79.5 kg) IBW/kg (Calculated) : 70.7  Vital Signs: Temp: 98.7 F (37.1 C) (09/16 0552) Temp Source: Oral (09/16 0552) BP: 122/52 mmHg (09/16 0552) Pulse Rate: 86 (09/16 0552) Intake/Output from previous day: 09/15 0701 - 09/16 0700 In: 132 [P.O.:357; I.V.:165; IV Piggyback:50] Out: 500 [Urine:500]  Labs:  Recent Labs  06/07/15 0608 06/08/15 0811 06/09/15 0504  WBC 37.5* 36.9* 25.9*  HGB 8.1* 8.0* 7.7*  PLT 600* 486* 456*  CREATININE 1.48* 2.12* 2.63*   Estimated Creatinine Clearance: 19 mL/min (by C-G formula based on Cr of 2.63). No results for input(s): VANCOTROUGH, VANCOPEAK, VANCORANDOM, GENTTROUGH, GENTPEAK, GENTRANDOM, TOBRATROUGH, TOBRAPEAK, TOBRARND, AMIKACINPEAK, AMIKACINTROU, AMIKACIN in the last 72 hours.   Microbiology: Recent Results (from the past 720 hour(s))  Culture, blood (routine x 2)     Status: None   Collection Time: 05/31/15  3:40 PM  Result Value Ref Range Status   Specimen Description BLOOD LEFT ANTECUBITAL  Final   Special Requests BOTTLES DRAWN AEROBIC AND ANAEROBIC 5CC  Final   Culture   Final    NO GROWTH 5 DAYS Performed at [facility]    Report Status 06/05/2015 FINAL  Final  Culture, blood (routine x 2)     Status: None   Collection Time: 05/31/15  3:45 PM  Result Value Ref Range Status   Specimen Description BLOOD BLOOD RIGHT HAND  Final   Special Requests BOTTLES DRAWN AEROBIC AND ANAEROBIC 5CC  Final   Culture   Final    NO GROWTH 5 DAYS Performed at [facility]    Report Status 06/05/2015 FINAL  Final  MRSA PCR Screening     Status: None   Collection Time: 05/31/15  6:55 PM  Result Value Ref Range Status   MRSA by PCR NEGATIVE NEGATIVE Final    Comment:        The  GeneXpert MRSA Assay (FDA approved for NASAL specimens only), is one component of a comprehensive MRSA colonization surveillance program. It is not intended to diagnose MRSA infection nor to guide or monitor treatment for MRSA infections.   Culture, blood (routine x 2)     Status: None (Preliminary result)   Collection Time: 06/06/15 11:20 AM  Result Value Ref Range Status   Specimen Description BLOOD RIGHT ARM  Final   Special Requests BOTTLES DRAWN AEROBIC AND ANAEROBIC 8CC  Final   Culture   Final    NO GROWTH 3 DAYS Performed at [facility]    Report Status PENDING  Incomplete  Culture, blood (routine x 2)     Status: None (Preliminary result)   Collection Time: 06/06/15 11:28 AM  Result Value Ref Range Status   Specimen Description BLOOD LEFT ARM  Final   Special Requests BOTTLES DRAWN AEROBIC AND ANAEROBIC 10CC  Final   Culture   Final    NO GROWTH 3 DAYS Performed at [facility]    Report Status PENDING  Incomplete    Medical History: Past Medical History  Diagnosis Date  . Hypertension   . Hyperlipidemia   . Pre-diabetes   . Thyroid disease   . Thrombocytosis   . GERD (gastroesophageal reflux disease)   . Vitamin D deficiency     Assessment: 6 yoM presented to ED  on 9/7 with weakness, falls, and abdominal pain.  PMH includes HTN, HLD, thrombocytosis (on Hydroxyurea), epistaxis, iron deficiency anemia, GERD.  He was recently on Keflex for cellulitis of distal left forearm, started on 9/1.  Originally started on vancomycin and ceftriaxone, stopped 9/13 by ID.  Source remains unclear, but with new fevers and continually rising WBC, to restart on Ceftazidime alone per pharmacy (drug selection suggested by ID)  9/8 >> Zosyn >> 9/8   9/8 >> Vanc >> 9/13 9/8 >> ceftriaxone >> 9/13 9/14 >> ceftazidime >>   Temp: no more fevers overnight WBC: elevated, but now decreasing Renal: Initial AKI had mostly resolved, now with new more severe AKI; CrCl 19  CG 9/15 CT cannot r/o PNA  9/7 blood: NGF 9/13 blood: NGTD  Goal of Therapy:  Appropriate abx dosing, eradication of infection.   Plan:  Day 3 antibiotics restart, day 8 total abx  Will reduce ceftazidime to 1 g IV q24 hr  Follow clinical course, renal function, culture results as available  Follow for de-escalation of antibiotics and LOT   Reuel Boom, PharmD, BCPS Pager: [phone] 06/09/2015, 11:56 AM

## 2015-06-09 NOTE — Progress Notes (Signed)
Patient ID: Jack Schultz, male   DOB: [birth year], 81 y.o.   MRN: [id number]         [facility]    Date of Admission:  05/31/2015           Day 3 ceftazidime  Principal Problem:   Fever Active Problems:   Symptomatic anemia   Acute kidney injury   Frequent falls   Complete heart block   Acute pain of left shoulder   Cellulitis of arm, left   Essential thrombocytosis   Essential hypertension   Vitamin D deficiency   Hypothyroidism   Hyperkalemia   Hyponatremia   Leucocytosis   Blood poisoning   Acute pericarditis   Atrial fibrillation   Absolute anemia   Acute confusional state   Malnutrition of moderate degree   Abdominal distension   . [START ON 06/10/2015] cefTAZidime (FORTAZ)  IV  1 g Intravenous Q24H  . cholecalciferol  5,000 Units Oral Daily  . feeding supplement (ENSURE ENLIVE)  237 mL Oral BID BM  . ferrous sulfate  325 mg Oral Daily  . hydroxyurea  1,000 mg Oral Daily  . levothyroxine  50 mcg Oral QAC breakfast  . polyethylene glycol  17 g Oral BID    SUBJECTIVE: He is feeling better today. He states that he is having less pain in his shoulders and left hand but also says "but no one has been tugging on me today."   Review of Systems: Constitutional: positive for weight loss, negative for anorexia, chills, fevers and sweats Ears, nose, mouth, throat, and face: negative Respiratory: positive for cough, negative for dyspnea on exertion, pleurisy/chest pain and sputum Cardiovascular: negative for exertional chest pressure/discomfort and palpitations Gastrointestinal: negative for abdominal pain and diarrhea Genitourinary:negative Musculoskeletal:decreased shoulder pain and left hand pain  Past Medical History  Diagnosis Date  . Hypertension   . Hyperlipidemia   . Pre-diabetes   . Thyroid disease   . Thrombocytosis   . GERD (gastroesophageal reflux disease)   . Vitamin D deficiency     Social History  Substance Use  Topics  . Smoking status: Former Smoker    Quit date: 09/24/1975  . Smokeless tobacco: Current User  . Alcohol Use: No    Family History  Problem Relation Age of Onset  . Hypertension Father   . Heart disease Father    Allergies  Allergen Reactions  . Ace Inhibitors Cough    OBJECTIVE: Filed Vitals:   06/08/15 2049 06/09/15 0552 06/09/15 1244 06/09/15 1315  BP: 110/61 122/52 115/49 132/51  Pulse: 96 86 119 85  Temp: 98.3 F (36.8 C) 98.7 F (37.1 C) 98.6 F (37 C) 98.5 F (36.9 C)  TempSrc: Oral Oral Oral Oral  Resp: 18 18 17 16   Height:      Weight:      SpO2: 95% 93% 93% 94%   Body mass index is 25.87 kg/(m^2).  General: He is visiting with his wife and some friends. He is alert and does not appear confused today. He is more talkative. Skin: no significant rash  Lungs: clear Cor: irregular S1 and S2 without murmur or rubs  Abdomen: soft and nontender Joints and extremities: He has decreased erythema and swelling on the dorsum of his left hand. No pain with palpation and range of motion  Lab Results Lab Results  Component Value Date   WBC 25.9* 06/09/2015   HGB 7.7* 06/09/2015   HCT 24.2* 06/09/2015   MCV 91.3 06/09/2015  PLT 456* 06/09/2015    Lab Results  Component Value Date   CREATININE 2.63* 06/09/2015   BUN 56* 06/09/2015   NA 129* 06/09/2015   K 4.4 06/09/2015   CL 97* 06/09/2015   CO2 23 06/09/2015    Lab Results  Component Value Date   ALT 24 05/31/2015   AST 49* 05/31/2015   ALKPHOS 103 05/31/2015   BILITOT 1.9* 05/31/2015     Microbiology: Recent Results (from the past 240 hour(s))  Culture, blood (routine x 2)     Status: None   Collection Time: 05/31/15  3:40 PM  Result Value Ref Range Status   Specimen Description BLOOD LEFT ANTECUBITAL  Final   Special Requests BOTTLES DRAWN AEROBIC AND ANAEROBIC 5CC  Final   Culture   Final    NO GROWTH 5 DAYS Performed at [facility]    Report Status 06/05/2015 FINAL  Final    Culture, blood (routine x 2)     Status: None   Collection Time: 05/31/15  3:45 PM  Result Value Ref Range Status   Specimen Description BLOOD BLOOD RIGHT HAND  Final   Special Requests BOTTLES DRAWN AEROBIC AND ANAEROBIC 5CC  Final   Culture   Final    NO GROWTH 5 DAYS Performed at [facility]    Report Status 06/05/2015 FINAL  Final  MRSA PCR Screening     Status: None   Collection Time: 05/31/15  6:55 PM  Result Value Ref Range Status   MRSA by PCR NEGATIVE NEGATIVE Final    Comment:        The GeneXpert MRSA Assay (FDA approved for NASAL specimens only), is one component of a comprehensive MRSA colonization surveillance program. It is not intended to diagnose MRSA infection nor to guide or monitor treatment for MRSA infections.   Culture, blood (routine x 2)     Status: None (Preliminary result)   Collection Time: 06/06/15 11:20 AM  Result Value Ref Range Status   Specimen Description BLOOD RIGHT ARM  Final   Special Requests BOTTLES DRAWN AEROBIC AND ANAEROBIC 8CC  Final   Culture   Final    NO GROWTH 3 DAYS Performed at [facility]    Report Status PENDING  Incomplete  Culture, blood (routine x 2)     Status: None (Preliminary result)   Collection Time: 06/06/15 11:28 AM  Result Value Ref Range Status   Specimen Description BLOOD LEFT ARM  Final   Special Requests BOTTLES DRAWN AEROBIC AND ANAEROBIC 10CC  Final   Culture   Final    NO GROWTH 3 DAYS Performed at [facility]    Report Status PENDING  Incomplete   CT of abdomen and pelvis 06/08/2015 IMPRESSION: No acute findings in the abdomen/ pelvis.  Moderate fecal retention over the rectum without evidence of obstruction.  Moderate splenomegaly which displaces the kidney anterior medially.  Moderate size bilateral pleural effusions with associated compressive atelectasis in the lower lobes. Possible airspace process over the most superior image over the lingula as  cannot exclude infection  Cardiomegaly with small pericardial effusion.   Electronically Signed  By: Marin Olp M.D.  On: 06/08/2015 16:12  ASSESSMENT: The cause of his recent intermittent fevers remains unclear. His recent chest x-ray and CT scan suggests the possibility of healthcare associated pneumonia. He is feeling better and his fevers and white blood cell count are decreasing since he started on ceftazadime. I will continue it for now.  PLAN:  1. Continue ceftazidime  Michel Bickers, MD [facility] for [place] [phone] pager   [phone] cell 06/09/2015, 2:52 PM

## 2015-06-10 DIAGNOSIS — J189 Pneumonia, unspecified organism: Secondary | ICD-10-CM | POA: Insufficient documentation

## 2015-06-10 DIAGNOSIS — F05 Delirium due to known physiological condition: Secondary | ICD-10-CM

## 2015-06-10 LAB — RENAL FUNCTION PANEL
ANION GAP: 9 (ref 5–15)
Albumin: 2.2 g/dL — ABNORMAL LOW (ref 3.5–5.0)
BUN: 66 mg/dL — ABNORMAL HIGH (ref 6–20)
CHLORIDE: 97 mmol/L — AB (ref 101–111)
CO2: 27 mmol/L (ref 22–32)
Calcium: 8.1 mg/dL — ABNORMAL LOW (ref 8.9–10.3)
Creatinine, Ser: 2.76 mg/dL — ABNORMAL HIGH (ref 0.61–1.24)
GFR calc Af Amer: 22 mL/min — ABNORMAL LOW (ref >60)
GFR calc non Af Amer: 19 mL/min — ABNORMAL LOW (ref >60)
GLUCOSE: 101 mg/dL — AB (ref 65–99)
POTASSIUM: 4.3 mmol/L (ref 3.5–5.1)
Phosphorus: 4.5 mg/dL (ref 2.5–4.6)
Sodium: 133 mmol/L — ABNORMAL LOW (ref 135–145)

## 2015-06-10 LAB — CBC
HEMATOCRIT: 31.6 % — AB (ref 39.0–52.0)
HEMOGLOBIN: 10.1 g/dL — AB (ref 13.0–17.0)
MCH: 28.6 pg (ref 26.0–34.0)
MCHC: 32 g/dL (ref 30.0–36.0)
MCV: 89.5 fL (ref 78.0–100.0)
Platelets: 553 10*3/uL — ABNORMAL HIGH (ref 150–400)
RBC: 3.53 MIL/uL — ABNORMAL LOW (ref 4.22–5.81)
RDW: 22.4 % — AB (ref 11.5–15.5)
WBC: 27.7 10*3/uL — ABNORMAL HIGH (ref 4.0–10.5)

## 2015-06-10 LAB — TYPE AND SCREEN
ABO/RH(D): B NEG
Antibody Screen: NEGATIVE
UNIT DIVISION: 0

## 2015-06-10 MED ORDER — POLYETHYLENE GLYCOL 3350 17 G PO PACK: 17.0000 g | PACK | Freq: Every day | ORAL | Status: DC | PRN

## 2015-06-10 NOTE — Progress Notes (Signed)
[facility] PROGRESS NOTE  Jack Schultz PFY:[id number] DOB: Aug 05, 1924 DOA: 05/31/2015 PCP: [name], MD   Brief narrative 81 year old male with history of hypertension, hyperlipidemia, thrombocytosis (on hydroxyurea, sees Dr. [name]), recurrent epistaxis for to be related to myeloproliferative disorder and aspirin therapy hypothyroidism, iron deficiency anemia and GERD brought to the ED by family as patient has been progressively getting weak for the past 4 weeks with poor by mouth intake and recurrent falls. Patient reports having frequent falls without any loss of consciousness or sustaining any injury. He was seen by his PCP on 9/1 days ago and was found to have cellulitis of his distal left forearm and prescribed a 2 weeks course of Keflex. He reports that since then he has been having pain in his left arm and shoulder. Family also reports patient having off-and-on diffuse abdominal pain past 1 week. No associated nausea and vomiting or bowel symptoms. Patient denies headache, dizziness, fever, chills, nausea , vomiting, chest pain, palpitations, SOB, abdominal pain, bowel or urinary symptoms. Denies change in weight but has poor appetite for last 3 weeks.  Course in the ED Patient blood pressure was low normal at 94/48 mmHg O2 sat of 89% on room air. Blood will done showed significant leukocytosis with WBC of 17.6, hemoglobin of 7.4 (baseline hemoglobin of 9-10), clumped platelets. Chemistry showed sodium of 129, potassium 5.4, chloride of 97, BUN of 42 and creatinine 1.48. Glucose 119. Chest x-ray and UA were unremarkable. Hospitalist admission requested for further management   Assessment/Plan: Sepsis Sepsis pathway initiated on admission. Blood cultures negative. 2-D echo does not comment on possible vegetations. Elevated ESR and CRP. Given diffuse ST elevation with pericardia effusion on echo acute myopericarditis is of concern.  -X-ray of the left shoulder and  wrist negative for effusion. -Appreciate ID recommendations.  -started ceftazidime 9-14 , discussed with Dr. Megan Salon.  -Continue with ceftazidime, treating for PNA. Plan to continue IV antibiotics for 3 more days.  -Afebrile for 48 hours.   Acute encephalopathy; Related to fever, medications.  Patient was agitated . He received ativan and haldol at 2 am and 4 am.  Resolved, back to baseline.   Respiratory failure; Hypoxemia:  Received a dose of lasix 9-12, 9-13 Chest x ray with pleural effusion.  Patient received lasix 9-16. Plan to repeat chest x ray 9-18. Follow nephrology recommendation regarding lasix.  Will need to discussed further need for thoracentesis, depending on chest x ray and if nephrologist recommend lasix.   Acute kidney injury, hyponatremia -Possibly due to dehydration and sepsis initially. Avoid nephrotoxins. .  Repeat UA negative for infection. . Repeat renal US 9-11 bordeline increased cortical diseases.  -Renal function got worse 9-15.   -Discussed with Dr Jana Hakim, can continue with hydroxyurea.   -CT showed spleen is displacing right kidney.  -appreciated Dr Florene Glen help. Patient received one dose of lasix. Urine out put 3.8 L on 9-16.  -Plan to repeat Chest X ray 9-18 to evaluate pleural effusion. Will defer to renal further lasix 9-18  Essential thrombocytosis, Leukocytosis/  -On hydroxyurea as outpatient. (Sees Dr. [name]). Has history of recurrent epistaxis suspected due to myeloproliferative disorder and aspirin therapy. -Patient with persistent leukocytosis. In reviewing records, he had in peripheral smear blast Metas and myelos labs 04-25-2015.  -Consulted hematology due to leukocytoses and history of myeloproliferative diseases.  -LDH 184, peripheral smear review by hematology no significant left shift.  -started on Agrylin 9-12. Discontinue due to rash. Management per oncologist  -follow WBC trend.  -  Hydroxyurea resume 9-14. Discussed with Dr Jana Hakim,  he doesn't think that hydroxyurea is affecting renal function.  -WBC trending down.  -will try to arrange meeting with family, my self and Dr Ammie Dalton to discussed care.   Abdominal distension; secondary to Constipation;  CT abdomen and pelvis no acute abnormalities, no abscess, no perforation. Splenomegaly displacing right kidney.  Having multiple BM. Will change miralax to PRN.   Left hand and shoulder pain X-rays unremarkable. Has limited mobility of the left arm due to shoulder pain.  uric acid at 7, normal  Resume antibiotics.  Ortho consulted. No evidence of infection per ortho.   Complete heart block Continue to monitor on telemetry. Discontinued metoprolol. Heart rate maintained in the 70s-80s. Cardiology following.  2-D echo with normal EF. No sings of  possible vegetation. Has trivial pericardial effusion. Ideally would need a pacemaker but not plan for now given underlying sepsis.  Elevated troponin  EKG done showed ST changes in inferior leads (? ST elevation). Troponin of 0.08 x 3. No chest pain.  Possibly due to  myopericarditis.   Acute pericarditis/ myopericarditis - Suspicion given diffuse ST elevation, trivial pericardial effusion on echo and underlying sepsis. However given his anemia will avoid high dose of aspirin and given his acute kidney injury cannot use NSAIDs or colchicine. -denies chest pain.    Hypotension Secondary to sepsis and dehydration. Discontinued fluids  Symptomatic anemia Baseline hemoglobin of around 9. Hemoglobin of 7.2 upon presentation. Received 2 units PRBC. Improved posttransfusion.. Stool for occult blood negative. TSH and B12 normal.  Generalized weakness with frequent falls Appears to be multifactorial with sepsis, anemia and dehydration. A.m. cortisol normal. Normal TSH and B12. Need SNF at discharge   Abdominal pain Resolved. Abdominal ultrasound unremarkable. Showed splenomegaly.   hypothyroidism TSH is normal. Continue  Synthroid  Prolonged Qtc (530)  Normal K and magnesium. Monitor on telemetry.   DVT prophylaxis: SCDs  Diet: Regular   Code Status: Full code Family Communication: Discussed with  daughter at bedside Disposition Plan: remain inpatient, work up for FUO...   Consultants:  Cardiology  Infectious disease  Procedures:  CT head  Ultrasound abdomen  2-D echo   Antibiotics:  IV Zosyn 9/ 7-9/8  IV 9/14 Ceftazidime  HPI/Subjective: He is feeling better today, more strength. Denies dyspnea. Having multiple BM.  Objective: Filed Vitals:   06/10/15 0600  BP: 110/66  Pulse: 74  Temp: 98 F (36.7 C)  Resp: 20    Intake/Output Summary (Last 24 hours) at 06/10/15 1207 Last data filed at 06/10/15 0949  Gross per 24 hour  Intake 538.33 ml  Output   4600 ml  Net -4061.67 ml   Filed Weights   05/31/15 1710 06/03/15 0400 06/06/15 0435  Weight: 77.6 kg (171 lb 1.2 oz) 84.4 kg (186 lb 1.1 oz) 79.5 kg (175 lb 4.3 oz)    Exam:   General:   in no acute distress  HEENT:  moist oral mucosa, supple neck  Chest: no significant crackles.    CVS: Normal S1 and S2, no murmurs rub or gallop  GI: Soft, BS present, abdomen less distended, softer, NT  Musculoskeletal: Warm, edema, tender to pressure over left wrist, left arm and shoulder ( acute on chronic)  CNS: alert in no distress   Data Reviewed: Basic Metabolic Panel:  Recent Labs Lab 06/04/15 0910  06/06/15 0512 06/06/15 1120 06/07/15 0608 06/08/15 0811 06/09/15 0504 06/10/15 0604  NA  --   < > 135  --  134* 130* 129* 133*  K  --   < > 4.4  --  3.8 4.2 4.4 4.3  CL  --   < > 103  --  102 98* 97* 97*  CO2  --   < > 24  --  $R'24 25 23 27  'Qc$ GLUCOSE  --   < > 120*  --  98 105* 97 101*  BUN  --   < > 42*  --  35* 46* 56* 66*  CREATININE  --   < > 1.32*  --  1.48* 2.12* 2.63* 2.76*  CALCIUM  --   < > 8.6*  --  8.3* 8.0* 7.7* 8.1*  MG 2.5*  --   --  2.1  --   --   --   --   PHOS  --   --   --   --   --   --    --  4.5  < > = values in this interval not displayed. Liver Function Tests:  Recent Labs Lab 06/10/15 0604  ALBUMIN 2.2*   No results for input(s): LIPASE, AMYLASE in the last 168 hours.  Recent Labs Lab 06/06/15 1120  AMMONIA 17   CBC:  Recent Labs Lab 06/05/15 1141 06/06/15 0512 06/07/15 0608 06/08/15 0811 06/09/15 0504 06/10/15 0614  WBC 21.5* 28.2* 37.5* 36.9* 25.9* 27.7*  NEUTROABS 19.4*  --   --  35.5*  --   --   HGB 8.4* 9.0* 8.1* 8.0* 7.7* 10.1*  HCT 27.0* 28.8* 26.2* 25.4* 24.2* 31.6*  MCV 94.4 94.4 93.2 92.4 91.3 89.5  PLT 554* 670* 600* 486* 456* 553*   Cardiac Enzymes: No results for input(s): CKTOTAL, CKMB, CKMBINDEX, TROPONINI in the last 168 hours. BNP (last 3 results)  Recent Labs  06/06/15 0512  BNP 425.4*    ProBNP (last 3 results) No results for input(s): PROBNP in the last 8760 hours.  CBG:  Recent Labs Lab 06/06/15 1157  GLUCAP 130*    Recent Results (from the past 240 hour(s))  Culture, blood (routine x 2)     Status: None   Collection Time: 05/31/15  3:40 PM  Result Value Ref Range Status   Specimen Description BLOOD LEFT ANTECUBITAL  Final   Special Requests BOTTLES DRAWN AEROBIC AND ANAEROBIC 5CC  Final   Culture   Final    NO GROWTH 5 DAYS Performed at [facility]    Report Status 06/05/2015 FINAL  Final  Culture, blood (routine x 2)     Status: None   Collection Time: 05/31/15  3:45 PM  Result Value Ref Range Status   Specimen Description BLOOD BLOOD RIGHT HAND  Final   Special Requests BOTTLES DRAWN AEROBIC AND ANAEROBIC 5CC  Final   Culture   Final    NO GROWTH 5 DAYS Performed at [facility]    Report Status 06/05/2015 FINAL  Final  MRSA PCR Screening     Status: None   Collection Time: 05/31/15  6:55 PM  Result Value Ref Range Status   MRSA by PCR NEGATIVE NEGATIVE Final    Comment:        The GeneXpert MRSA Assay (FDA approved for NASAL specimens only), is one component of  a comprehensive MRSA colonization surveillance program. It is not intended to diagnose MRSA infection nor to guide or monitor treatment for MRSA infections.   Culture, blood (routine x 2)     Status: None (Preliminary result)   Collection Time: 06/06/15  11:20 AM  Result Value Ref Range Status   Specimen Description BLOOD RIGHT ARM  Final   Special Requests BOTTLES DRAWN AEROBIC AND ANAEROBIC 8CC  Final   Culture   Final    NO GROWTH 3 DAYS Performed at [facility]    Report Status PENDING  Incomplete  Culture, blood (routine x 2)     Status: None (Preliminary result)   Collection Time: 06/06/15 11:28 AM  Result Value Ref Range Status   Specimen Description BLOOD LEFT ARM  Final   Special Requests BOTTLES DRAWN AEROBIC AND ANAEROBIC 10CC  Final   Culture   Final    NO GROWTH 3 DAYS Performed at [facility]    Report Status PENDING  Incomplete     Studies: Ct Abdomen Pelvis Wo Contrast  06/08/2015   CLINICAL DATA:  Progressively getting weak for the past 4 weeks with poor by mouth intake and recurrent falls. Patient reports having frequent falls without any loss of consciousness or sustaining any injury, Family also reports patient having diffuse abdominal pain past week.  EXAM: CT ABDOMEN AND PELVIS WITHOUT CONTRAST  TECHNIQUE: Multidetector CT imaging of the abdomen and pelvis was performed following the standard protocol without IV contrast.  COMPARISON:  Chest x-ray 06/06/2015 and abdominal film 06/08/2015  FINDINGS: Lung bases demonstrate moderate bilateral pleural effusions with associated compressive lower lobe atelectasis. Subtle hazy airspace density over the most superior image in the region of the lingula as cannot exclude infection. There is cardiomegaly with a small pericardial effusion  Abdominal images demonstrate moderate splenomegaly. The enlarged spleen displaces the left kidney anterior medially.  The liver, pancreas, gallbladder and adrenal glands are  within normal. Kidneys are normal in size without hydronephrosis or nephrolithiasis. No focal renal mass. Ureters are normal. Appendix is normal.  There is mild calcified plaque over the abdominal aorta and iliac arteries.  Very small amount of free fluid in the pericolic gutters and just above the bladder.  Small bowel is within normal. There is moderate fecal retention over the rectum as the colon is otherwise within normal.  Pelvic images demonstrate the bladder and prostate to be within normal.  There is mild subcutaneous edema over the lateral and dependent abdomen/pelvis.  There is a focus of heterotopic bone versus myositis ossificans over the left adductor muscle group as well as anterior right hip musculature. There are moderate degenerative changes throughout the spine with multilevel disc disease throughout the lumbar spine. Several vertebral body hemangiomas. There mild degenerate changes of the hips.  IMPRESSION: No acute findings in the abdomen/ pelvis.  Moderate fecal retention over the rectum without evidence of obstruction.  Moderate splenomegaly which displaces the kidney anterior medially.  Moderate size bilateral pleural effusions with associated compressive atelectasis in the lower lobes. Possible airspace process over the most superior image over the lingula as cannot exclude infection  Cardiomegaly with small pericardial effusion.   Electronically Signed   By: Marin Olp M.D.   On: 06/08/2015 16:12   Dg Chest 2 View  06/09/2015   CLINICAL DATA:  Cough and shortness of breath.  Acute kidney injury.  EXAM: CHEST  2 VIEW  COMPARISON:  06/06/2015, 05/31/2015.  FINDINGS: Cardiac silhouette markedly enlarged, unchanged. Interval worsening of interstitial and perihilar airspace pulmonary edema. Large bilateral pleural effusions, increased in size. Dense consolidation in the lower lobes, worse than on the prior examinations. Degenerative changes throughout the thoracic and visualized upper  lumbar spine. Severe degenerative changes in the  shoulders.  IMPRESSION: 1. Worsening interstitial and airspace pulmonary edema since the examination 3 days ago. 2. Large bilateral pleural effusions, increased in size since the examination 3 days ago. 3. Dense passive atelectasis in the lower lobes (favored over pneumonia) with worsening aeration.   Electronically Signed   By: Evangeline Dakin M.D.   On: 06/09/2015 15:46   Dg Abd Decub  06/08/2015   CLINICAL DATA:  Evaluate for free intraperitoneal air. Abdominal distension.  EXAM: ABDOMEN - 1 VIEW DECUBITUS  COMPARISON:  Chest radiograph 06/06/2015  FINDINGS: Single decubitus view demonstrates no radiographic evidence free intraperitoneal air. Scattered dilated loops of bowel are demonstrated through the central abdomen. Extensive lower thoracic lumbar spine degenerative changes. Heterogeneous opacities within the lung bases.  IMPRESSION: No evidence for free intraperitoneal air.   Electronically Signed   By: Lovey Newcomer M.D.   On: 06/08/2015 12:43    Scheduled Meds: . cefTAZidime (FORTAZ)  IV  1 g Intravenous Q24H  . cholecalciferol  5,000 Units Oral Daily  . feeding supplement (ENSURE ENLIVE)  237 mL Oral BID BM  . ferrous sulfate  325 mg Oral Daily  . hydroxyurea  1,000 mg Oral Daily  . levothyroxine  50 mcg Oral QAC breakfast   Continuous Infusions:      Time spent: 25 minutes    Regalado, Belkys A  [facility] Pager [phone] If 7PM-7AM, please contact night-coverage at [email or web address], password [facility] 06/10/2015, 12:07 PM  LOS: 10 days

## 2015-06-10 NOTE — Progress Notes (Signed)
   WBC continue > 20K     Results for Jack Schultz, Jack Schultz (MRN [id number]) as of 06/10/2015 10:26  Ref. Range 06/06/2015 05:12 06/07/2015 06:08 06/08/2015 08:11 06/09/2015 05:04 06/10/2015 06:14  WBC Latest Ref Range: 4.0-10.5 K/uL 28.2 (H) 37.5 (H) 36.9 (H) 25.9 (H) 27.7 (H)   Continue hydrea at 1 g po daily.  Re check CBC in AM  Creat may be stabilizing  Results for Jack Schultz, Jack Schultz (MRN [id number]) as of 06/10/2015 10:26  Ref. Range 06/06/2015 05:12 06/07/2015 06:08 06/08/2015 08:11 06/09/2015 05:04 06/10/2015 06:04  Creatinine Latest Ref Range: 0.61-1.24 mg/dL 1.32 (H) 1.48 (H) 2.12 (H) 2.63 (H) 2.76 (H)   Appreciate nephrology's help in the care of this patient!

## 2015-06-10 NOTE — Progress Notes (Signed)
SUBJECTIVE:  Feels better after IV diuretics  OBJECTIVE:   Vitals:   Filed Vitals:   06/09/15 1315 06/09/15 1715 06/09/15 1955 06/10/15 0600  BP: 132/51 107/61 121/45 110/66  Pulse: 85 96 71 74  Temp: 98.5 F (36.9 C) 98.3 F (36.8 C) 98.1 F (36.7 C) 98 F (36.7 C)  TempSrc: Oral Oral Oral Oral  Resp: 16 16 16 20   Height:      Weight:      SpO2: 94% 96% 96% 95%   I&O's:   Intake/Output Summary (Last 24 hours) at 06/10/15 3536 Last data filed at 06/10/15 0600  Gross per 24 hour  Intake 658.33 ml  Output   3850 ml  Net -3191.67 ml   TELEMETRY: Reviewed telemetry pt in atrial fibrillation:     PHYSICAL EXAM General: Well developed, well nourished, in no acute distress Head: Eyes PERRLA, No xanthomas.   Normal cephalic and atramatic  Lungs:   Crackles at bases with decreased BS Heart:   Irregularly irregular S1 S2 Pulses are 2+ & equal. Abdomen: Bowel sounds are positive, abdomen soft and non-tender without masses  Extremities:   No clubbing, cyanosis or edema.  DP +1 Neuro: Alert and oriented X 3. Psych:  Good affect, responds appropriately   LABS: Basic Metabolic Panel:  Recent Labs  06/09/15 0504 06/10/15 0604  NA 129* 133*  K 4.4 4.3  CL 97* 97*  CO2 23 27  GLUCOSE 97 101*  BUN 56* 66*  CREATININE 2.63* 2.76*  CALCIUM 7.7* 8.1*  PHOS  --  4.5   Liver Function Tests:  Recent Labs  06/10/15 0604  ALBUMIN 2.2*   No results for input(s): LIPASE, AMYLASE in the last 72 hours. CBC:  Recent Labs  06/08/15 0811 06/09/15 0504 06/10/15 0614  WBC 36.9* 25.9* 27.7*  NEUTROABS 35.5*  --   --   HGB 8.0* 7.7* 10.1*  HCT 25.4* 24.2* 31.6*  MCV 92.4 91.3 89.5  PLT 486* 456* 553*   Cardiac Enzymes: No results for input(s): CKTOTAL, CKMB, CKMBINDEX, TROPONINI in the last 72 hours. BNP: Invalid input(s): POCBNP D-Dimer: No results for input(s): DDIMER in the last 72 hours. Hemoglobin A1C: No results for input(s): HGBA1C in the last 72  hours. Fasting Lipid Panel: No results for input(s): CHOL, HDL, LDLCALC, TRIG, CHOLHDL, LDLDIRECT in the last 72 hours. Thyroid Function Tests: No results for input(s): TSH, T4TOTAL, T3FREE, THYROIDAB in the last 72 hours.  Invalid input(s): FREET3 Anemia Panel: No results for input(s): VITAMINB12, FOLATE, FERRITIN, TIBC, IRON, RETICCTPCT in the last 72 hours. Coag Panel:   Lab Results  Component Value Date   INR 1.61* 06/01/2015    RADIOLOGY: Ct Abdomen Pelvis Wo Contrast  06/08/2015   CLINICAL DATA:  Progressively getting weak for the past 4 weeks with poor by mouth intake and recurrent falls. Patient reports having frequent falls without any loss of consciousness or sustaining any injury, Family also reports patient having diffuse abdominal pain past week.  EXAM: CT ABDOMEN AND PELVIS WITHOUT CONTRAST  TECHNIQUE: Multidetector CT imaging of the abdomen and pelvis was performed following the standard protocol without IV contrast.  COMPARISON:  Chest x-ray 06/06/2015 and abdominal film 06/08/2015  FINDINGS: Lung bases demonstrate moderate bilateral pleural effusions with associated compressive lower lobe atelectasis. Subtle hazy airspace density over the most superior image in the region of the lingula as cannot exclude infection. There is cardiomegaly with a small pericardial effusion  Abdominal images demonstrate moderate splenomegaly. The enlarged spleen  displaces the left kidney anterior medially.  The liver, pancreas, gallbladder and adrenal glands are within normal. Kidneys are normal in size without hydronephrosis or nephrolithiasis. No focal renal mass. Ureters are normal. Appendix is normal.  There is mild calcified plaque over the abdominal aorta and iliac arteries.  Very small amount of free fluid in the pericolic gutters and just above the bladder.  Small bowel is within normal. There is moderate fecal retention over the rectum as the colon is otherwise within normal.  Pelvic images  demonstrate the bladder and prostate to be within normal.  There is mild subcutaneous edema over the lateral and dependent abdomen/pelvis.  There is a focus of heterotopic bone versus myositis ossificans over the left adductor muscle group as well as anterior right hip musculature. There are moderate degenerative changes throughout the spine with multilevel disc disease throughout the lumbar spine. Several vertebral body hemangiomas. There mild degenerate changes of the hips.  IMPRESSION: No acute findings in the abdomen/ pelvis.  Moderate fecal retention over the rectum without evidence of obstruction.  Moderate splenomegaly which displaces the kidney anterior medially.  Moderate size bilateral pleural effusions with associated compressive atelectasis in the lower lobes. Possible airspace process over the most superior image over the lingula as cannot exclude infection  Cardiomegaly with small pericardial effusion.   Electronically Signed   By: Marin Olp M.D.   On: 06/08/2015 16:12   Dg Chest 2 View  06/09/2015   CLINICAL DATA:  Cough and shortness of breath.  Acute kidney injury.  EXAM: CHEST  2 VIEW  COMPARISON:  06/06/2015, 05/31/2015.  FINDINGS: Cardiac silhouette markedly enlarged, unchanged. Interval worsening of interstitial and perihilar airspace pulmonary edema. Large bilateral pleural effusions, increased in size. Dense consolidation in the lower lobes, worse than on the prior examinations. Degenerative changes throughout the thoracic and visualized upper lumbar spine. Severe degenerative changes in the shoulders.  IMPRESSION: 1. Worsening interstitial and airspace pulmonary edema since the examination 3 days ago. 2. Large bilateral pleural effusions, increased in size since the examination 3 days ago. 3. Dense passive atelectasis in the lower lobes (favored over pneumonia) with worsening aeration.   Electronically Signed   By: Evangeline Dakin M.D.   On: 06/09/2015 15:46   Dg Chest 2  View  06/06/2015   CLINICAL DATA:  Shortness of breath, hypertension.  EXAM: CHEST  2 VIEW  COMPARISON:  05/31/2015  FINDINGS: Cardiomegaly. Bilateral perihilar and lower lobe airspace opacities could reflect atelectasis, edema or a combination. Small bilateral pleural effusions suspected.  Advanced degenerative changes in the shoulders and thoracic spine. No acute bony abnormality.  IMPRESSION: Cardiomegaly with bilateral perihilar and lower lobe opacities. This could reflect edema, atelectasis or a combination.  Small layering effusions.   Electronically Signed   By: Rolm Baptise M.D.   On: 06/06/2015 16:26   Dg Chest 2 View  05/31/2015   CLINICAL DATA:  Progressive, predominantly lower extremity weakness over the past year, worse in the last week. Fevers and decreased appetite.  EXAM: CHEST  2 VIEW  COMPARISON:  None.  FINDINGS: The cardiac silhouette is upper limits of normal in size. Thoracic aortic calcification is noted. There is slight coarsening of the interstitial markings bilaterally. No confluent airspace consolidation, edema, pleural effusion, or pneumothorax is identified. Curvilinear lucency projecting over the lateral left lung base likely represents a skin fold. Thoracic spondylosis is noted.  IMPRESSION: No active cardiopulmonary disease.   Electronically Signed   By: Seymour Bars.D.  On: 05/31/2015 10:38   Dg Shoulder 1v Left  05/31/2015   CLINICAL DATA:  Left shoulder pain, remote trauma  EXAM: LEFT SHOULDER - 1 VIEW  COMPARISON:  None.  FINDINGS: Severe left glenohumeral joint degenerative change. High-riding left humeral head may indicate rotator cuff tendinopathy. AC joint degenerative change.  IMPRESSION: Degenerative change as above.   Electronically Signed   By: Conchita Paris M.D.   On: 05/31/2015 21:04   Dg Wrist Complete Left  06/01/2015   CLINICAL DATA:  Ulnar-sided wrist pain extending across the posterior hand in into the index finger.  EXAM: LEFT WRIST - COMPLETE 3+ VIEW   COMPARISON:  None.  FINDINGS: No acute fracture or dislocation is identified. There is moderate to severe joint space narrowing at the first [facility] joint with subchondral sclerosis and mild osteophytosis. No lytic or blastic osseous lesion or osseous erosion is identified. Vascular calcifications are noted.  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   Electronically Signed   By: Logan Bores M.D.   On: 06/01/2015 14:54   Ct Head Wo Contrast  06/06/2015   CLINICAL DATA:  Confusion.  Left facial droop.  EXAM: CT HEAD WITHOUT CONTRAST  TECHNIQUE: Contiguous axial images were obtained from the base of the skull through the vertex without intravenous contrast.  COMPARISON:  06/01/2015  FINDINGS: There is mild cerebral and cerebellar atrophy, unchanged. Minimal periventricular white matter hypoattenuation is unchanged and nonspecific but compatible with chronic small vessel ischemia and not greater than expected for patient's age.  Orbits are unremarkable. Small right maxillary sinus mucous retention cyst and trace left inferior mastoid air cell opacification are noted. No skull fracture. Calcified atherosclerosis at the skullbase.  IMPRESSION: No evidence of acute intracranial abnormality.   Electronically Signed   By: Logan Bores M.D.   On: 06/06/2015 15:44   Ct Head Wo Contrast  06/01/2015   CLINICAL DATA:  Prior headache, now resolved. Altered mental status. History of hypertension.  EXAM: CT HEAD WITHOUT CONTRAST  TECHNIQUE: Contiguous axial images were obtained from the base of the skull through the vertex without intravenous contrast.  COMPARISON:  None.  FINDINGS: The ventricles and sulci are normal for age. No intraparenchymal hemorrhage, mass effect nor midline shift. Patchy supratentorial white matter hypodensities are less than expected for patient's age and though non-specific suggest sequelae of chronic small vessel ischemic disease. No acute large vascular territory infarcts.  No  abnormal extra-axial fluid collections. Basal cisterns are patent. Moderate calcific atherosclerosis of the carotid siphons.  No skull fracture. The included ocular globes and orbital contents are non-suspicious. Small paranasal sinus mucosal retention cysts without paranasal sinus air-fluid levels. The mastoid air cells are well aerated.  IMPRESSION: Negative noncontrast CT head for age.   Electronically Signed   By: Elon Alas M.D.   On: 06/01/2015 00:39   US Renal  06/04/2015   CLINICAL DATA:  Acute renal failure  EXAM: RENAL / URINARY TRACT ULTRASOUND COMPLETE  COMPARISON:  05/31/2015 abdominal ultrasound  FINDINGS: Right Kidney:  Length: 10.2 cm. Echogenicity at upper limits of normal without hydronephrosis or focal mass.  Left Kidney:  Length: 10.5 cm. Echogenicity at upper limits of normal without hydronephrosis or focal mass.  Bladder:  Appears normal for degree of bladder distention.  Splenomegaly and echogenic splenic lesions most likely hemangiomas reidentified. This is not fully evaluated at this targeted exam today.  IMPRESSION: Borderline increased renal cortical echogenicity compatible with medical renal disease.   Electronically Signed  By: Conchita Paris M.D.   On: 06/04/2015 13:11   US Abdomen Port  06/01/2015   CLINICAL DATA:  Abdominal pain.  EXAM: ULTRASOUND PORTABLE ABDOMEN  COMPARISON:  None.  FINDINGS: Gallbladder: Limited visualization due to patient positioning. No gallstones or wall thickening visualized. No sonographic Murphy sign noted.  Common bile duct: Diameter: 3.5 mm, normal  Liver: No focal lesion identified. Within normal limits in parenchymal echogenicity.  IVC: No abnormality visualized.  Pancreas: Not visualized due to overlying bowel gas.  Spleen: Spleen length measures 13.4 cm with volume 328 mL. Circumscribed focal hyperechoic lesions are demonstrated in the spleen, largest measuring 2 cm maximal diameter and smaller measuring 1.7 cm maximal diameter. These are  likely to represent hemangiomas.  Right Kidney: Length: 10.5 cm. Echogenicity within normal limits. No mass or hydronephrosis visualized.  Left Kidney: Length: 10.9 cm. Echogenicity within normal limits. No mass or hydronephrosis visualized.  Abdominal aorta: No aneurysm visualized.  Other findings: Small bilateral pleural effusions noted.  IMPRESSION: Splenic enlargement. Focal hyperechoic lesions in the spleen likely representing hemangiomas. Small bilateral pleural effusions are noted.   Electronically Signed   By: Lucienne Capers M.D.   On: 06/01/2015 00:51   Dg Abd Decub  06/08/2015   CLINICAL DATA:  Evaluate for free intraperitoneal air. Abdominal distension.  EXAM: ABDOMEN - 1 VIEW DECUBITUS  COMPARISON:  Chest radiograph 06/06/2015  FINDINGS: Single decubitus view demonstrates no radiographic evidence free intraperitoneal air. Scattered dilated loops of bowel are demonstrated through the central abdomen. Extensive lower thoracic lumbar spine degenerative changes. Heterogeneous opacities within the lung bases.  IMPRESSION: No evidence for free intraperitoneal air.   Electronically Signed   By: Lovey Newcomer M.D.   On: 06/08/2015 12:43   Dg Humerus Left  05/31/2015   CLINICAL DATA:  Chronic left humeral pain, remote injury  EXAM: LEFT HUMERUS - 2+ VIEW  COMPARISON:  None.  FINDINGS: There is no evidence of fracture or other focal bone lesions. Soft tissues are unremarkable. Severe left glenohumeral joint degenerative change.  IMPRESSION: No acute abnormality.   Electronically Signed   By: Conchita Paris M.D.   On: 05/31/2015 20:49   Assessment/Plan  Principal Problem:  Fever Active Problems:  Essential thrombocytosis  Essential hypertension  Vitamin D deficiency  Hypothyroidism  Symptomatic anemia  Hyperkalemia  Hyponatremia  Acute kidney injury  Frequent falls  Leucocytosis  Complete heart block  Acute pain of left shoulder  Cellulitis of arm, left  Blood poisoning   Acute pericarditis  Atrial fibrillation  Absolute anemia  Acute confusional state   1. Complete heart block transitioned to afib - initially in what appears to be CHB although HR was 60-70s on initial EKG, atenolol stopped, now appears to be in atrial flutter with HR 70s. Heart rate controlled - TSH normal, last dose 9/6. Echo 06/01/2015 EF 55%, mild AR, mild MR, moderate TR, PA peak pressure 58mmHg. Trace effusion.   - No plan for pacemaker therapy at this time. Similarly, no plan for cardioversion or antiarrhythmics since his flutter is asymptomatic and he has had neither bradycardia or tachycardia. Pericarditis may explain the conduction abnormalities and atrial flutter.   2. Pericarditis: EKG shows diffusely elevated J point ST segments suggestive of pericarditis.  He is not having any significant pain however. At this time with increased creat, would avoid NSAIDS or colchicine. Repeat echo with only trivial posterior effusion.  3. Atrial Fibrillation: rate is controlled and he is asymptomatic. Given his current pericarditis and  marked weakness and anemia, he is not an optimal candidate for anticoagulation at this time.   4. Anemia: Previously transfused with 2 units PRBC. Hgb is10. IM following.  5. Generalized weakness with frequent fall: 3 week progression. Prior to this, quite active.   6. AKI: Bump in SCr from 1.48-->2.12-->2.76. BUN is 66.  EF was normal on echo 06/01/15 and on repeat 9/15.  There was no evidence of volume overload and central venous pressure was normal on echo but he put out 4 L after IV lasix high dose.    7. Hypertension: BP stable.   8. Hypothyroidism: TSH normal  9. Leukocytosis: afebrile overnight.  White count still up at 27. Chest x-ray shows some bibasilar densities and effusions. He is on antibiotics. ID following. 2D echo repeated with normal valves  10. Prolonged QT: Haldol stopped. Would avoid QT prolonging  drugs.  12. SOB: feels better after IV lasix.  cxray yesterday with large effusions.  13. Confusion: improved. More oriented today. Family member is by bedside. This may become worse due to hyponatremia. IM to follow.  14.  Pleural effusions - consider thoracentesis per IM    Sueanne Margarita, MD  06/10/2015  8:21 AM

## 2015-06-10 NOTE — Progress Notes (Signed)
Physical Therapy Treatment Patient Details Name: Jack Schultz MRN: [id number] DOB: 1924-01-25 Today's Date: 06/10/2015    History of Present Illness 81 yo male admitted with fever, sepsis, heart block with possible need for pacemaker. Hx of HTN, bil shoulder pain (chronic).  Pt with confusion, agitation, left facial droop 9/13, unable to tolerate MRI, neurology following.    PT Comments    Patient tolerated ambulating x 100' with RW on RA. HR max 104. Patient pleased to be up.  Follow Up Recommendations  SNF     Equipment Recommendations  Rolling walker with 5" wheels    Recommendations for Other Services       Precautions / Restrictions Precautions Precautions: Fall    Mobility  Bed Mobility   Bed Mobility: Rolling;Sidelying to Sit Rolling: Min guard Sidelying to sit: Min assist       General bed mobility comments: increased time, cues to reach for rail, assist for trunk to sit upright.  Transfers Overall transfer level: Needs assistance Equipment used: Rolling walker (2 wheeled) Transfers: Sit to/from Stand Sit to Stand: From elevated surface;Mod assist         General transfer comment: cues for hand placement, extra time  Ambulation/Gait  Cues for posture, narrow base of support, + 2 for safety. Stopped x 2 for break.               Stairs            Wheelchair Mobility    Modified Rankin (Stroke Patients Only)       Balance                                    Cognition Arousal/Alertness: Awake/alert                          Exercises      General Comments        Pertinent Vitals/Pain Faces Pain Scale: Hurts little more Pain Location: abdomen Pain Descriptors / Indicators: Tightness;Sore Pain Intervention(s): Monitored during session    Home Living                      Prior Function            PT Goals (current goals can now be found in the care plan section) Progress towards PT  goals: Progressing toward goals    Frequency  Min 3X/week    PT Plan Current plan remains appropriate    Co-evaluation             End of Session Equipment Utilized During Treatment: Gait belt Activity Tolerance: Patient tolerated treatment well Patient left: in chair;with call bell/phone within reach;with chair alarm set;with family/visitor present     Time: 9417-4081 PT Time Calculation (min) (ACUTE ONLY): 17 min  Charges:  $Gait Training: 8-22 mins                    G Codes:      [name] 06/10/2015, 3:28 PM

## 2015-06-10 NOTE — Progress Notes (Signed)
1 Nonoliguric AKI, hemodynamically mediated due to low BP, arrhythmia, anemia, BP meds 2 PNA v CHF or both 3 Anemia 4 Myeloproliferative Syndrome  Plan: 1 Will follow today 2 Re check CXR in AM 9/18  Subjective: Interval History: Breathing better.  Brisk response to furosemide with 3.8 liters out!  Objective: Vital signs in last 24 hours: Temp:  [98 F (36.7 C)-98.6 F (37 C)] 98 F (36.7 C) (09/17 0600) Pulse Rate:  [71-119] 74 (09/17 0600) Resp:  [16-20] 20 (09/17 0600) BP: (107-132)/(45-66) 110/66 mmHg (09/17 0600) SpO2:  [93 %-96 %] 95 % (09/17 0600) Weight change:   Intake/Output from previous day: 09/16 0701 - 09/17 0700 In: 658.3 [P.O.:240; I.V.:78.3; Blood:340] Out: 1657 [Urine:3850] Intake/Output this shift:    General appearance: alert and cooperative Resp: diminished breath sounds bibasilar Cardio: regular rate and rhythm, S1, S2 normal, no murmur, click, rub or gallop Extremities: edema 2+ on left 1+ on right  Lab Results:  Recent Labs  06/09/15 0504 06/10/15 0614  WBC 25.9* 27.7*  HGB 7.7* 10.1*  HCT 24.2* 31.6*  PLT 456* 553*   BMET:  Recent Labs  06/09/15 0504 06/10/15 0604  NA 129* 133*  K 4.4 4.3  CL 97* 97*  CO2 23 27  GLUCOSE 97 101*  BUN 56* 66*  CREATININE 2.63* 2.76*  CALCIUM 7.7* 8.1*   No results for input(s): PTH in the last 72 hours. Iron Studies: No results for input(s): IRON, TIBC, TRANSFERRIN, FERRITIN in the last 72 hours. Studies/Results: Ct Abdomen Pelvis Wo Contrast  06/08/2015   CLINICAL DATA:  Progressively getting weak for the past 4 weeks with poor by mouth intake and recurrent falls. Patient reports having frequent falls without any loss of consciousness or sustaining any injury, Family also reports patient having diffuse abdominal pain past week.  EXAM: CT ABDOMEN AND PELVIS WITHOUT CONTRAST  TECHNIQUE: Multidetector CT imaging of the abdomen and pelvis was performed following the standard protocol without IV  contrast.  COMPARISON:  Chest x-ray 06/06/2015 and abdominal film 06/08/2015  FINDINGS: Lung bases demonstrate moderate bilateral pleural effusions with associated compressive lower lobe atelectasis. Subtle hazy airspace density over the most superior image in the region of the lingula as cannot exclude infection. There is cardiomegaly with a small pericardial effusion  Abdominal images demonstrate moderate splenomegaly. The enlarged spleen displaces the left kidney anterior medially.  The liver, pancreas, gallbladder and adrenal glands are within normal. Kidneys are normal in size without hydronephrosis or nephrolithiasis. No focal renal mass. Ureters are normal. Appendix is normal.  There is mild calcified plaque over the abdominal aorta and iliac arteries.  Very small amount of free fluid in the pericolic gutters and just above the bladder.  Small bowel is within normal. There is moderate fecal retention over the rectum as the colon is otherwise within normal.  Pelvic images demonstrate the bladder and prostate to be within normal.  There is mild subcutaneous edema over the lateral and dependent abdomen/pelvis.  There is a focus of heterotopic bone versus myositis ossificans over the left adductor muscle group as well as anterior right hip musculature. There are moderate degenerative changes throughout the spine with multilevel disc disease throughout the lumbar spine. Several vertebral body hemangiomas. There mild degenerate changes of the hips.  IMPRESSION: No acute findings in the abdomen/ pelvis.  Moderate fecal retention over the rectum without evidence of obstruction.  Moderate splenomegaly which displaces the kidney anterior medially.  Moderate size bilateral pleural effusions with associated compressive  atelectasis in the lower lobes. Possible airspace process over the most superior image over the lingula as cannot exclude infection  Cardiomegaly with small pericardial effusion.   Electronically Signed    By: Marin Olp M.D.   On: 06/08/2015 16:12   Dg Chest 2 View  06/09/2015   CLINICAL DATA:  Cough and shortness of breath.  Acute kidney injury.  EXAM: CHEST  2 VIEW  COMPARISON:  06/06/2015, 05/31/2015.  FINDINGS: Cardiac silhouette markedly enlarged, unchanged. Interval worsening of interstitial and perihilar airspace pulmonary edema. Large bilateral pleural effusions, increased in size. Dense consolidation in the lower lobes, worse than on the prior examinations. Degenerative changes throughout the thoracic and visualized upper lumbar spine. Severe degenerative changes in the shoulders.  IMPRESSION: 1. Worsening interstitial and airspace pulmonary edema since the examination 3 days ago. 2. Large bilateral pleural effusions, increased in size since the examination 3 days ago. 3. Dense passive atelectasis in the lower lobes (favored over pneumonia) with worsening aeration.   Electronically Signed   By: Evangeline Dakin M.D.   On: 06/09/2015 15:46   Dg Abd Decub  06/08/2015   CLINICAL DATA:  Evaluate for free intraperitoneal air. Abdominal distension.  EXAM: ABDOMEN - 1 VIEW DECUBITUS  COMPARISON:  Chest radiograph 06/06/2015  FINDINGS: Single decubitus view demonstrates no radiographic evidence free intraperitoneal air. Scattered dilated loops of bowel are demonstrated through the central abdomen. Extensive lower thoracic lumbar spine degenerative changes. Heterogeneous opacities within the lung bases.  IMPRESSION: No evidence for free intraperitoneal air.   Electronically Signed   By: Lovey Newcomer M.D.   On: 06/08/2015 12:43    Scheduled: . cefTAZidime (FORTAZ)  IV  1 g Intravenous Q24H  . cholecalciferol  5,000 Units Oral Daily  . feeding supplement (ENSURE ENLIVE)  237 mL Oral BID BM  . ferrous sulfate  325 mg Oral Daily  . hydroxyurea  1,000 mg Oral Daily  . levothyroxine  50 mcg Oral QAC breakfast  . polyethylene glycol  17 g Oral BID     LOS: 10 days   POWELL,ALVIN C 06/10/2015,8:21  AM

## 2015-06-10 NOTE — Progress Notes (Signed)
Patient ID: Jack Schultz, male   DOB: 1924/04/08, 81 y.o.   MRN: [id number]         [facility]    Date of Admission:  05/31/2015           Day 4 ceftazidime  Principal Problem:   Fever Active Problems:   Symptomatic anemia   Acute kidney injury   Frequent falls   Complete heart block   Acute pain of left shoulder   Cellulitis of arm, left   Essential thrombocytosis   Essential hypertension   Vitamin D deficiency   Hypothyroidism   Hyperkalemia   Hyponatremia   Leucocytosis   Blood poisoning   Acute pericarditis   Atrial fibrillation   Absolute anemia   Acute confusional state   Malnutrition of moderate degree   Abdominal distension   . cefTAZidime (FORTAZ)  IV  1 g Intravenous Q24H  . cholecalciferol  5,000 Units Oral Daily  . feeding supplement (ENSURE ENLIVE)  237 mL Oral BID BM  . ferrous sulfate  325 mg Oral Daily  . hydroxyurea  1,000 mg Oral Daily  . levothyroxine  50 mcg Oral QAC breakfast  . polyethylene glycol  17 g Oral BID    SUBJECTIVE: He states that he is feeling better. He still has a little bit of cough but states it has improved and he is not bringing up as much sputum as he was several days ago. He does note some shortness of breath when he is up moving around. He did sit up in a chair for several hours yesterday. He is not having as much pain in his shoulders and left hand.  Review of Systems: Constitutional: positive for anorexia and weight loss, negative for chills, fevers and sweats Respiratory: positive for cough, dyspnea on exertion and sputum, negative for pleurisy/chest pain and wheezing Cardiovascular: negative for chest pain, chest pressure/discomfort and palpitations Gastrointestinal: negative for abdominal pain and diarrhea Genitourinary:negative Musculoskeletal:decreased pain in shoulders and left hand  Past Medical History  Diagnosis Date  . Hypertension   . Hyperlipidemia   . Pre-diabetes   .  Thyroid disease   . Thrombocytosis   . GERD (gastroesophageal reflux disease)   . Vitamin D deficiency     Social History  Substance Use Topics  . Smoking status: Former Smoker    Quit date: 09/24/1975  . Smokeless tobacco: Current User  . Alcohol Use: No    Family History  Problem Relation Age of Onset  . Hypertension Father   . Heart disease Father    Allergies  Allergen Reactions  . Ace Inhibitors Cough    OBJECTIVE: Filed Vitals:   06/09/15 1315 06/09/15 1715 06/09/15 1955 06/10/15 0600  BP: 132/51 107/61 121/45 110/66  Pulse: 85 96 71 74  Temp: 98.5 F (36.9 C) 98.3 F (36.8 C) 98.1 F (36.7 C) 98 F (36.7 C)  TempSrc: Oral Oral Oral Oral  Resp: 16 16 16 20   Height:      Weight:      SpO2: 94% 96% 96% 95%   Body mass index is 25.87 kg/(m^2).  General: He is comfortable alert and talkative Skin: no rash. IV site okay Oral: No oropharyngeal lesions. Mucous membranes moist Lungs: clear anteriorly Cor: irregular S1 and S2 without murmur or rubs  Abdomen: soft and nontender Joints and extremities: He has decreased erythema and swelling on the dorsum of his left hand.  Neuro: He is no longer confused  Lab Results Lab  Results  Component Value Date   WBC 27.7* 06/10/2015   HGB 10.1* 06/10/2015   HCT 31.6* 06/10/2015   MCV 89.5 06/10/2015   PLT 553* 06/10/2015    Lab Results  Component Value Date   CREATININE 2.76* 06/10/2015   BUN 66* 06/10/2015   NA 133* 06/10/2015   K 4.3 06/10/2015   CL 97* 06/10/2015   CO2 27 06/10/2015    Lab Results  Component Value Date   ALT 24 05/31/2015   AST 49* 05/31/2015   ALKPHOS 103 05/31/2015   BILITOT 1.9* 05/31/2015     Microbiology: Recent Results (from the past 240 hour(s))  Culture, blood (routine x 2)     Status: None   Collection Time: 05/31/15  3:40 PM  Result Value Ref Range Status   Specimen Description BLOOD LEFT ANTECUBITAL  Final   Special Requests BOTTLES DRAWN AEROBIC AND ANAEROBIC 5CC   Final   Culture   Final    NO GROWTH 5 DAYS Performed at [facility]    Report Status 06/05/2015 FINAL  Final  Culture, blood (routine x 2)     Status: None   Collection Time: 05/31/15  3:45 PM  Result Value Ref Range Status   Specimen Description BLOOD BLOOD RIGHT HAND  Final   Special Requests BOTTLES DRAWN AEROBIC AND ANAEROBIC 5CC  Final   Culture   Final    NO GROWTH 5 DAYS Performed at [facility]    Report Status 06/05/2015 FINAL  Final  MRSA PCR Screening     Status: None   Collection Time: 05/31/15  6:55 PM  Result Value Ref Range Status   MRSA by PCR NEGATIVE NEGATIVE Final    Comment:        The GeneXpert MRSA Assay (FDA approved for NASAL specimens only), is one component of a comprehensive MRSA colonization surveillance program. It is not intended to diagnose MRSA infection nor to guide or monitor treatment for MRSA infections.   Culture, blood (routine x 2)     Status: None (Preliminary result)   Collection Time: 06/06/15 11:20 AM  Result Value Ref Range Status   Specimen Description BLOOD RIGHT ARM  Final   Special Requests BOTTLES DRAWN AEROBIC AND ANAEROBIC [place]  Final   Culture   Final    NO GROWTH 3 DAYS Performed at [facility]    Report Status PENDING  Incomplete  Culture, blood (routine x 2)     Status: None (Preliminary result)   Collection Time: 06/06/15 11:28 AM  Result Value Ref Range Status   Specimen Description BLOOD LEFT ARM  Final   Special Requests BOTTLES DRAWN AEROBIC AND ANAEROBIC 10CC  Final   Culture   Final    NO GROWTH 3 DAYS Performed at [facility]    Report Status PENDING  Incomplete   CHEST 2 VIEW 06/08/2016  COMPARISON: 06/06/2015, 05/31/2015.  FINDINGS: Cardiac silhouette markedly enlarged, unchanged. Interval worsening of interstitial and perihilar airspace pulmonary edema. Large bilateral pleural effusions, increased in size. Dense consolidation in the lower lobes, worse than  on the prior examinations. Degenerative changes throughout the thoracic and visualized upper lumbar spine. Severe degenerative changes in the shoulders.  IMPRESSION: 1. Worsening interstitial and airspace pulmonary edema since the examination 3 days ago. 2. Large bilateral pleural effusions, increased in size since the examination 3 days ago. 3. Dense passive atelectasis in the lower lobes (favored over pneumonia) with worsening aeration.   Electronically Signed  By:  Evangeline Dakin M.D.  On: 06/09/2015 15:46  ASSESSMENT: I suspect that he has some basilar healthcare associated pneumonia underlying his increasing pleural effusions that is responding to empiric ceftazidime therapy. I plan on continuing ceftazidime for 3 more days.  PLAN: 1. Continue ceftazidime 2. Please call me for any infectious disease questions this weekend  Michel Bickers, MD [facility] [phone] pager   [phone] cell 06/10/2015, 9:51 AM

## 2015-06-11 ENCOUNTER — Inpatient Hospital Stay (HOSPITAL_COMMUNITY): Payer: Medicare Other

## 2015-06-11 LAB — BASIC METABOLIC PANEL
Anion gap: 9 (ref 5–15)
BUN: 73 mg/dL — AB (ref 6–20)
CALCIUM: 7.9 mg/dL — AB (ref 8.9–10.3)
CHLORIDE: 100 mmol/L — AB (ref 101–111)
CO2: 27 mmol/L (ref 22–32)
CREATININE: 2.37 mg/dL — AB (ref 0.61–1.24)
GFR, EST AFRICAN AMERICAN: 26 mL/min — AB (ref >60)
GFR, EST NON AFRICAN AMERICAN: 23 mL/min — AB (ref >60)
Glucose, Bld: 100 mg/dL — ABNORMAL HIGH (ref 65–99)
Potassium: 3.5 mmol/L (ref 3.5–5.1)
SODIUM: 136 mmol/L (ref 135–145)

## 2015-06-11 LAB — CULTURE, BLOOD (ROUTINE X 2)
CULTURE: NO GROWTH
Culture: NO GROWTH

## 2015-06-11 LAB — CBC
HCT: 28 % — ABNORMAL LOW (ref 39.0–52.0)
HEMOGLOBIN: 9 g/dL — AB (ref 13.0–17.0)
MCH: 28.7 pg (ref 26.0–34.0)
MCHC: 32.1 g/dL (ref 30.0–36.0)
MCV: 89.2 fL (ref 78.0–100.0)
PLATELETS: 446 10*3/uL — AB (ref 150–400)
RBC: 3.14 MIL/uL — ABNORMAL LOW (ref 4.22–5.81)
RDW: 22.5 % — AB (ref 11.5–15.5)
WBC: 18.4 10*3/uL — ABNORMAL HIGH (ref 4.0–10.5)

## 2015-06-11 MED ORDER — FUROSEMIDE 10 MG/ML IJ SOLN
80.0000 mg | Freq: Once | INTRAMUSCULAR | Status: AC
  Administered 2015-06-11: 80 mg via INTRAVENOUS
  Filled 2015-06-11: qty 8

## 2015-06-11 MED ORDER — HYDROXYUREA 500 MG PO CAPS
500.0000 mg | ORAL_CAPSULE | Freq: Every day | ORAL | Status: DC
  Administered 2015-06-11 – 2015-06-13 (×3): 500 mg via ORAL
  Filled 2015-06-11 (×3): qty 1

## 2015-06-11 NOTE — Progress Notes (Signed)
Mr Rathke's WBC are finally <20K-- am dropping the hydrea dose to 500 mg/day.  Issue of advanced directives remains to be discussed by Dr Benay Spice. Family was supposed to have had a discussion of this over the weekend. They specifically did NOT want to discuss it with me today.  Please let me know if I can be of further help.

## 2015-06-11 NOTE — Progress Notes (Signed)
SUBJECTIVE:  No complaints  OBJECTIVE:   Vitals:   Filed Vitals:   06/10/15 0600 06/10/15 1400 06/10/15 2021 06/11/15 0332  BP: 110/66 111/47 125/66 127/63  Pulse: 74 92 82 86  Temp: 98 F (36.7 C) 97.4 F (36.3 C) 97.4 F (36.3 C) 97.8 F (36.6 C)  TempSrc: Oral Oral Oral Oral  Resp: 20 20 20 18   Height:      Weight:      SpO2: 95% 94% 97% 96%   I&O's:   Intake/Output Summary (Last 24 hours) at 06/11/15 0809 Last data filed at 06/11/15 0745  Gross per 24 hour  Intake    650 ml  Output   3500 ml  Net  -2850 ml   TELEMETRY: Reviewed telemetry pt in atrial flutter:     PHYSICAL EXAM General: Well developed, well nourished, in no acute distress Head: Eyes PERRLA, No xanthomas.   Normal cephalic and atramatic  Lungs:   Decreased BS at bases Heart:   HRRR S1 S2 Pulses are 2+ & equal. Abdomen: Bowel sounds are positive, abdomen soft and non-tender without masses Extremities:   No clubbing, cyanosis or edema.  DP +1 Neuro: Alert and oriented X 3. Psych:  Good affect, responds appropriately   LABS: Basic Metabolic Panel:  Recent Labs  06/10/15 0604 06/11/15 0515  NA 133* 136  K 4.3 3.5  CL 97* 100*  CO2 27 27  GLUCOSE 101* 100*  BUN 66* 73*  CREATININE 2.76* 2.37*  CALCIUM 8.1* 7.9*  PHOS 4.5  --    Liver Function Tests:  Recent Labs  06/10/15 0604  ALBUMIN 2.2*   No results for input(s): LIPASE, AMYLASE in the last 72 hours. CBC:  Recent Labs  06/08/15 0811  06/10/15 0614 06/11/15 0515  WBC 36.9*  < > 27.7* 18.4*  NEUTROABS 35.5*  --   --   --   HGB 8.0*  < > 10.1* 9.0*  HCT 25.4*  < > 31.6* 28.0*  MCV 92.4  < > 89.5 89.2  PLT 486*  < > 553* 446*  < > = values in this interval not displayed. Cardiac Enzymes: No results for input(s): CKTOTAL, CKMB, CKMBINDEX, TROPONINI in the last 72 hours. BNP: Invalid input(s): POCBNP D-Dimer: No results for input(s): DDIMER in the last 72 hours. Hemoglobin A1C: No results for input(s): HGBA1C in  the last 72 hours. Fasting Lipid Panel: No results for input(s): CHOL, HDL, LDLCALC, TRIG, CHOLHDL, LDLDIRECT in the last 72 hours. Thyroid Function Tests: No results for input(s): TSH, T4TOTAL, T3FREE, THYROIDAB in the last 72 hours.  Invalid input(s): FREET3 Anemia Panel: No results for input(s): VITAMINB12, FOLATE, FERRITIN, TIBC, IRON, RETICCTPCT in the last 72 hours. Coag Panel:   Lab Results  Component Value Date   INR 1.61* 06/01/2015    RADIOLOGY: Ct Abdomen Pelvis Wo Contrast  06/08/2015   CLINICAL DATA:  Progressively getting weak for the past 4 weeks with poor by mouth intake and recurrent falls. Patient reports having frequent falls without any loss of consciousness or sustaining any injury, Family also reports patient having diffuse abdominal pain past week.  EXAM: CT ABDOMEN AND PELVIS WITHOUT CONTRAST  TECHNIQUE: Multidetector CT imaging of the abdomen and pelvis was performed following the standard protocol without IV contrast.  COMPARISON:  Chest x-ray 06/06/2015 and abdominal film 06/08/2015  FINDINGS: Lung bases demonstrate moderate bilateral pleural effusions with associated compressive lower lobe atelectasis. Subtle hazy airspace density over the most superior image in the region  of the lingula as cannot exclude infection. There is cardiomegaly with a small pericardial effusion  Abdominal images demonstrate moderate splenomegaly. The enlarged spleen displaces the left kidney anterior medially.  The liver, pancreas, gallbladder and adrenal glands are within normal. Kidneys are normal in size without hydronephrosis or nephrolithiasis. No focal renal mass. Ureters are normal. Appendix is normal.  There is mild calcified plaque over the abdominal aorta and iliac arteries.  Very small amount of free fluid in the pericolic gutters and just above the bladder.  Small bowel is within normal. There is moderate fecal retention over the rectum as the colon is otherwise within normal.  Pelvic  images demonstrate the bladder and prostate to be within normal.  There is mild subcutaneous edema over the lateral and dependent abdomen/pelvis.  There is a focus of heterotopic bone versus myositis ossificans over the left adductor muscle group as well as anterior right hip musculature. There are moderate degenerative changes throughout the spine with multilevel disc disease throughout the lumbar spine. Several vertebral body hemangiomas. There mild degenerate changes of the hips.  IMPRESSION: No acute findings in the abdomen/ pelvis.  Moderate fecal retention over the rectum without evidence of obstruction.  Moderate splenomegaly which displaces the kidney anterior medially.  Moderate size bilateral pleural effusions with associated compressive atelectasis in the lower lobes. Possible airspace process over the most superior image over the lingula as cannot exclude infection  Cardiomegaly with small pericardial effusion.   Electronically Signed   By: Marin Olp M.D.   On: 06/08/2015 16:12   Dg Chest 2 View  06/09/2015   CLINICAL DATA:  Cough and shortness of breath.  Acute kidney injury.  EXAM: CHEST  2 VIEW  COMPARISON:  06/06/2015, 05/31/2015.  FINDINGS: Cardiac silhouette markedly enlarged, unchanged. Interval worsening of interstitial and perihilar airspace pulmonary edema. Large bilateral pleural effusions, increased in size. Dense consolidation in the lower lobes, worse than on the prior examinations. Degenerative changes throughout the thoracic and visualized upper lumbar spine. Severe degenerative changes in the shoulders.  IMPRESSION: 1. Worsening interstitial and airspace pulmonary edema since the examination 3 days ago. 2. Large bilateral pleural effusions, increased in size since the examination 3 days ago. 3. Dense passive atelectasis in the lower lobes (favored over pneumonia) with worsening aeration.   Electronically Signed   By: Evangeline Dakin M.D.   On: 06/09/2015 15:46   Dg Chest 2  View  06/06/2015   CLINICAL DATA:  Shortness of breath, hypertension.  EXAM: CHEST  2 VIEW  COMPARISON:  05/31/2015  FINDINGS: Cardiomegaly. Bilateral perihilar and lower lobe airspace opacities could reflect atelectasis, edema or a combination. Small bilateral pleural effusions suspected.  Advanced degenerative changes in the shoulders and thoracic spine. No acute bony abnormality.  IMPRESSION: Cardiomegaly with bilateral perihilar and lower lobe opacities. This could reflect edema, atelectasis or a combination.  Small layering effusions.   Electronically Signed   By: Rolm Baptise M.D.   On: 06/06/2015 16:26   Dg Chest 2 View  05/31/2015   CLINICAL DATA:  Progressive, predominantly lower extremity weakness over the past year, worse in the last week. Fevers and decreased appetite.  EXAM: CHEST  2 VIEW  COMPARISON:  None.  FINDINGS: The cardiac silhouette is upper limits of normal in size. Thoracic aortic calcification is noted. There is slight coarsening of the interstitial markings bilaterally. No confluent airspace consolidation, edema, pleural effusion, or pneumothorax is identified. Curvilinear lucency projecting over the lateral left lung base likely represents  a skin fold. Thoracic spondylosis is noted.  IMPRESSION: No active cardiopulmonary disease.   Electronically Signed   By: Logan Bores M.D.   On: 05/31/2015 10:38   Dg Shoulder 1v Left  05/31/2015   CLINICAL DATA:  Left shoulder pain, remote trauma  EXAM: LEFT SHOULDER - 1 VIEW  COMPARISON:  None.  FINDINGS: Severe left glenohumeral joint degenerative change. High-riding left humeral head may indicate rotator cuff tendinopathy. AC joint degenerative change.  IMPRESSION: Degenerative change as above.   Electronically Signed   By: Conchita Paris M.D.   On: 05/31/2015 21:04   Dg Wrist Complete Left  06/01/2015   CLINICAL DATA:  Ulnar-sided wrist pain extending across the posterior hand in into the index finger.  EXAM: LEFT WRIST - COMPLETE 3+ VIEW   COMPARISON:  None.  FINDINGS: No acute fracture or dislocation is identified. There is moderate to severe joint space narrowing at the first [facility] joint with subchondral sclerosis and mild osteophytosis. No lytic or blastic osseous lesion or osseous erosion is identified. Vascular calcifications are noted.  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   Electronically Signed   By: Logan Bores M.D.   On: 06/01/2015 14:54   Ct Head Wo Contrast  06/06/2015   CLINICAL DATA:  Confusion.  Left facial droop.  EXAM: CT HEAD WITHOUT CONTRAST  TECHNIQUE: Contiguous axial images were obtained from the base of the skull through the vertex without intravenous contrast.  COMPARISON:  06/01/2015  FINDINGS: There is mild cerebral and cerebellar atrophy, unchanged. Minimal periventricular white matter hypoattenuation is unchanged and nonspecific but compatible with chronic small vessel ischemia and not greater than expected for patient's age.  Orbits are unremarkable. Small right maxillary sinus mucous retention cyst and trace left inferior mastoid air cell opacification are noted. No skull fracture. Calcified atherosclerosis at the skullbase.  IMPRESSION: No evidence of acute intracranial abnormality.   Electronically Signed   By: Logan Bores M.D.   On: 06/06/2015 15:44   Ct Head Wo Contrast  06/01/2015   CLINICAL DATA:  Prior headache, now resolved. Altered mental status. History of hypertension.  EXAM: CT HEAD WITHOUT CONTRAST  TECHNIQUE: Contiguous axial images were obtained from the base of the skull through the vertex without intravenous contrast.  COMPARISON:  None.  FINDINGS: The ventricles and sulci are normal for age. No intraparenchymal hemorrhage, mass effect nor midline shift. Patchy supratentorial white matter hypodensities are less than expected for patient's age and though non-specific suggest sequelae of chronic small vessel ischemic disease. No acute large vascular territory infarcts.  No  abnormal extra-axial fluid collections. Basal cisterns are patent. Moderate calcific atherosclerosis of the carotid siphons.  No skull fracture. The included ocular globes and orbital contents are non-suspicious. Small paranasal sinus mucosal retention cysts without paranasal sinus air-fluid levels. The mastoid air cells are well aerated.  IMPRESSION: Negative noncontrast CT head for age.   Electronically Signed   By: Elon Alas M.D.   On: 06/01/2015 00:39   US Renal  06/04/2015   CLINICAL DATA:  Acute renal failure  EXAM: RENAL / URINARY TRACT ULTRASOUND COMPLETE  COMPARISON:  05/31/2015 abdominal ultrasound  FINDINGS: Right Kidney:  Length: 10.2 cm. Echogenicity at upper limits of normal without hydronephrosis or focal mass.  Left Kidney:  Length: 10.5 cm. Echogenicity at upper limits of normal without hydronephrosis or focal mass.  Bladder:  Appears normal for degree of bladder distention.  Splenomegaly and echogenic splenic lesions most likely hemangiomas reidentified. This  is not fully evaluated at this targeted exam today.  IMPRESSION: Borderline increased renal cortical echogenicity compatible with medical renal disease.   Electronically Signed   By: Conchita Paris M.D.   On: 06/04/2015 13:11   US Abdomen Port  06/01/2015   CLINICAL DATA:  Abdominal pain.  EXAM: ULTRASOUND PORTABLE ABDOMEN  COMPARISON:  None.  FINDINGS: Gallbladder: Limited visualization due to patient positioning. No gallstones or wall thickening visualized. No sonographic Murphy sign noted.  Common bile duct: Diameter: 3.5 mm, normal  Liver: No focal lesion identified. Within normal limits in parenchymal echogenicity.  IVC: No abnormality visualized.  Pancreas: Not visualized due to overlying bowel gas.  Spleen: Spleen length measures 13.4 cm with volume 328 mL. Circumscribed focal hyperechoic lesions are demonstrated in the spleen, largest measuring 2 cm maximal diameter and smaller measuring 1.7 cm maximal diameter. These are  likely to represent hemangiomas.  Right Kidney: Length: 10.5 cm. Echogenicity within normal limits. No mass or hydronephrosis visualized.  Left Kidney: Length: 10.9 cm. Echogenicity within normal limits. No mass or hydronephrosis visualized.  Abdominal aorta: No aneurysm visualized.  Other findings: Small bilateral pleural effusions noted.  IMPRESSION: Splenic enlargement. Focal hyperechoic lesions in the spleen likely representing hemangiomas. Small bilateral pleural effusions are noted.   Electronically Signed   By: Lucienne Capers M.D.   On: 06/01/2015 00:51   Dg Abd Decub  06/08/2015   CLINICAL DATA:  Evaluate for free intraperitoneal air. Abdominal distension.  EXAM: ABDOMEN - 1 VIEW DECUBITUS  COMPARISON:  Chest radiograph 06/06/2015  FINDINGS: Single decubitus view demonstrates no radiographic evidence free intraperitoneal air. Scattered dilated loops of bowel are demonstrated through the central abdomen. Extensive lower thoracic lumbar spine degenerative changes. Heterogeneous opacities within the lung bases.  IMPRESSION: No evidence for free intraperitoneal air.   Electronically Signed   By: Lovey Newcomer M.D.   On: 06/08/2015 12:43   Dg Humerus Left  05/31/2015   CLINICAL DATA:  Chronic left humeral pain, remote injury  EXAM: LEFT HUMERUS - 2+ VIEW  COMPARISON:  None.  FINDINGS: There is no evidence of fracture or other focal bone lesions. Soft tissues are unremarkable. Severe left glenohumeral joint degenerative change.  IMPRESSION: No acute abnormality.   Electronically Signed   By: Conchita Paris M.D.   On: 05/31/2015 20:49    Assessment/Plan  Principal Problem:  Fever Active Problems:  Essential thrombocytosis  Essential hypertension  Vitamin D deficiency  Hypothyroidism  Symptomatic anemia  Hyperkalemia  Hyponatremia  Acute kidney injury  Frequent falls  Leucocytosis  Complete heart block  Acute pain of left shoulder  Cellulitis of arm, left  Blood poisoning   Acute pericarditis  Atrial fibrillation  Absolute anemia  Acute confusional state   1. Complete heart block transitioned to afib - initially in what appears to be CHB although HR was 60-70s on initial EKG, atenolol stopped, now in atrial flutter with HR 70s. Heart rate controlled - TSH normal, last dose 9/6. Echo 06/01/2015 EF 55%, mild AR, mild MR, moderate TR, PA peak pressure 59mmHg. Trace effusion.  - No plan for pacemaker therapy at this time. Similarly, no plan for cardioversion or antiarrhythmics since his flutter is asymptomatic and he has had neither bradycardia or tachycardia. Pericarditis may explain the conduction abnormalities and atrial flutter.   2. Pericarditis: EKG shows diffusely elevated J point ST segments suggestive of pericarditis. He is not having any significant pain however. At this time with increased creat, would avoid NSAIDS or colchicine.  Repeat echo with only trivial posterior effusion.  3.  Acute diastolic CHF: 2D echo with normal LVF and central venous pressure was normal on echo but he has put out 5L since increasing Lasix.  Total net neg 4L. Cxray pending (Friday had large b/l pleural effusions and CHF).  Continue IV lasix.  4. Atrial Fibrillation: rate is controlled and he is asymptomatic. Given his current pericarditis and marked weakness and anemia, he is not an optimal candidate for anticoagulation at this time.   5. Anemia: Previously transfused with 2 units PRBC. Hgb is 9. IM following.  6. Generalized weakness with frequent fall: 3 week progression. Prior to this, quite active.   7. AKI: Bump in SCr from 1.48-->2.12-->2.76-->2.37.  Now improving with diuresis.  8. Hypertension: BP stable.   9. Hypothyroidism: TSH normal  10. Leukocytosis:  He is on antibiotics. ID following. 2D echo repeated with normal valves  11. Prolonged QT: Haldol stopped. Would avoid QT prolonging drugs.  12. SOB: feels better  after IV lasix. cxray yesterday with large effusions.  13. Pleural effusions - consider thoracentesis per IM     Sueanne Margarita, MD  06/11/2015  8:09 AM

## 2015-06-11 NOTE — Progress Notes (Signed)
[facility] PROGRESS NOTE  Jack Schultz NOT:[id number] DOB: April 25, 1924 DOA: 05/31/2015 PCP: [name], MD   Brief narrative 81 year old male with history of hypertension, hyperlipidemia, thrombocytosis (on hydroxyurea, sees Dr. [name]), recurrent epistaxis for to be related to myeloproliferative disorder and aspirin therapy hypothyroidism, iron deficiency anemia and GERD brought to the ED by family as patient has been progressively getting weak for the past 4 weeks with poor by mouth intake and recurrent falls. Patient reports having frequent falls without any loss of consciousness or sustaining any injury. He was seen by his PCP on 9/1 days ago and was found to have cellulitis of his distal left forearm and prescribed a 2 weeks course of Keflex. He reports that since then he has been having pain in his left arm and shoulder. Family also reports patient having off-and-on diffuse abdominal pain past 1 week. No associated nausea and vomiting or bowel symptoms. Patient denies headache, dizziness, fever, chills, nausea , vomiting, chest pain, palpitations, SOB, abdominal pain, bowel or urinary symptoms. Denies change in weight but has poor appetite for last 3 weeks.  Course in the ED Patient blood pressure was low normal at 94/48 mmHg O2 sat of 89% on room air. Blood will done showed significant leukocytosis with WBC of 17.6, hemoglobin of 7.4 (baseline hemoglobin of 9-10), clumped platelets. Chemistry showed sodium of 129, potassium 5.4, chloride of 97, BUN of 42 and creatinine 1.48. Glucose 119. Chest x-ray and UA were unremarkable. Hospitalist admission requested for further management   Assessment/Plan: Sepsis Sepsis pathway initiated on admission. Blood cultures negative. 2-D echo does not comment on possible vegetations. Elevated ESR and CRP. Given diffuse ST elevation with pericardia effusion on echo acute myopericarditis is of concern.  -X-ray of the left shoulder and  wrist negative for effusion. -Appreciate ID recommendations.  -started ceftazidime 9-14 , discussed with Dr. Megan Salon.  -Continue with ceftazidime, treating for PNA. Plan to continue IV antibiotics for 2 more days. Dr Megan Salon to reassess on Monday.  -Afebrile for 48 hours.   Acute encephalopathy; Related to fever, medications.  Patient was agitated . He received ativan and haldol at 2 am and 4 am.  Resolved, back to baseline.   Respiratory failure; Hypoxemia:  Received a dose of lasix 9-12, 9-13 Chest x ray with pleural effusion.  Patient received lasix 9-16. Plan to repeat chest x ray 9-18. Follow nephrology recommendation regarding lasix.  Patient to received another dose of lasix. Chest x ray with small effusion.   Acute kidney injury, hyponatremia -Possibly due to dehydration and sepsis initially. Avoid nephrotoxins. .  Repeat UA negative for infection. . Repeat renal US 9-11 bordeline increased cortical diseases.  -Renal function got worse 9-15.   -Discussed with Dr Jana Hakim, can continue with hydroxyurea.   -CT showed spleen is displacing right kidney.  -appreciated Dr Florene Glen help. Cr has decreased to 2.3. Lasix dose again today,.   Essential thrombocytosis, Leukocytosis/  -On hydroxyurea as outpatient. (Sees Dr. [name]). Has history of recurrent epistaxis suspected due to myeloproliferative disorder and aspirin therapy. -Patient with persistent leukocytosis. In reviewing records, he had in peripheral smear blast Metas and myelos labs 04-25-2015.  -Consulted hematology due to leukocytoses and history of myeloproliferative diseases.  -LDH 184, peripheral smear review by hematology no significant left shift.  -started on Agrylin 9-12. Discontinue due to rash. Management per oncologist  -follow WBC trend.  -Hydroxyurea resume 9-14. -WBC trending down.  -will try to arrange meeting with family, my self and Dr Ammie Dalton to  discussed care.   Abdominal distension; secondary to  Constipation;  CT abdomen and pelvis no acute abnormalities, no abscess, no perforation. Splenomegaly displacing right kidney.  Having multiple BM. Will change miralax to PRN.   Left hand and shoulder pain X-rays unremarkable. Has limited mobility of the left arm due to shoulder pain.  uric acid at 7, normal  Resume antibiotics.  Ortho consulted. No evidence of infection per ortho.   Complete heart block Continue to monitor on telemetry. Discontinued metoprolol. Heart rate maintained in the 70s-80s. Cardiology following.  2-D echo with normal EF. No sings of  possible vegetation. Has trivial pericardial effusion. Ideally would need a pacemaker but not plan for now given underlying sepsis.  Elevated troponin  EKG done showed ST changes in inferior leads (? ST elevation). Troponin of 0.08 x 3. No chest pain.  Possibly due to  myopericarditis.   Acute pericarditis/ myopericarditis - Suspicion given diffuse ST elevation, trivial pericardial effusion on echo and underlying sepsis. However given his anemia will avoid high dose of aspirin and given his acute kidney injury cannot use NSAIDs or colchicine. -Denies chest pain.    Hypotension Secondary to sepsis and dehydration. Discontinued fluids  Symptomatic anemia Baseline hemoglobin of around 9. Hemoglobin of 7.2 upon presentation. Received 2 units PRBC. Improved posttransfusion.. Stool for occult blood negative. TSH and B12 normal.  Generalized weakness with frequent falls Appears to be multifactorial with sepsis, anemia and dehydration. A.m. cortisol normal. Normal TSH and B12. Need SNF at discharge   Abdominal pain Resolved. Abdominal ultrasound unremarkable. Showed splenomegaly.   hypothyroidism TSH is normal. Continue Synthroid  Prolonged Qtc (530)  Normal K and magnesium. Monitor on telemetry.   DVT prophylaxis: SCDs  Diet: Regular   Code Status: Full code Family Communication: Discussed with  daughter at  bedside Disposition Plan: remain inpatient, work up for FUO...   Consultants:  Cardiology  Infectious disease  Procedures:  CT head  Ultrasound abdomen  2-D echo   Antibiotics:  IV Zosyn 9/ 7-9/8  IV 9/14 Ceftazidime  HPI/Subjective: Had BM, loose no watery.  Denies abdominal pain. Breathing better   Objective: Filed Vitals:   06/11/15 1353  BP: 123/58  Pulse: 89  Temp: 97.3 F (36.3 C)  Resp: 20    Intake/Output Summary (Last 24 hours) at 06/11/15 1445 Last data filed at 06/11/15 8675  Gross per 24 hour  Intake    650 ml  Output   2750 ml  Net  -2100 ml   Filed Weights   06/03/15 0400 06/06/15 0435 06/11/15 0955  Weight: 84.4 kg (186 lb 1.1 oz) 79.5 kg (175 lb 4.3 oz) 81.058 kg (178 lb 11.2 oz)    Exam:   General:   in no acute distress  HEENT:  moist oral mucosa, supple neck  Chest: no significant crackles.    CVS: Normal S1 and S2, no murmurs rub or gallop  GI: Soft, BS present, abdomen less distended, softer, NT  Musculoskeletal: Warm, edema, tender to pressure over left wrist, left arm and shoulder ( acute on chronic)  CNS: alert in no distress   Data Reviewed: Basic Metabolic Panel:  Recent Labs Lab 06/06/15 1120 06/07/15 0608 06/08/15 0811 06/09/15 0504 06/10/15 0604 06/11/15 0515  NA  --  134* 130* 129* 133* 136  K  --  3.8 4.2 4.4 4.3 3.5  CL  --  102 98* 97* 97* 100*  CO2  --  $R'24 25 23 27 27  'rm$ GLUCOSE  --  98 105* 97 101* 100*  BUN  --  35* 46* 56* 66* 73*  CREATININE  --  1.48* 2.12* 2.63* 2.76* 2.37*  CALCIUM  --  8.3* 8.0* 7.7* 8.1* 7.9*  MG 2.1  --   --   --   --   --   PHOS  --   --   --   --  4.5  --    Liver Function Tests:  Recent Labs Lab 06/10/15 0604  ALBUMIN 2.2*   No results for input(s): LIPASE, AMYLASE in the last 168 hours.  Recent Labs Lab 06/06/15 1120  AMMONIA 17   CBC:  Recent Labs Lab 06/05/15 1141  06/07/15 0608 06/08/15 0811 06/09/15 0504 06/10/15 0614 06/11/15 0515  WBC  21.5*  < > 37.5* 36.9* 25.9* 27.7* 18.4*  NEUTROABS 19.4*  --   --  35.5*  --   --   --   HGB 8.4*  < > 8.1* 8.0* 7.7* 10.1* 9.0*  HCT 27.0*  < > 26.2* 25.4* 24.2* 31.6* 28.0*  MCV 94.4  < > 93.2 92.4 91.3 89.5 89.2  PLT 554*  < > 600* 486* 456* 553* 446*  < > = values in this interval not displayed. Cardiac Enzymes: No results for input(s): CKTOTAL, CKMB, CKMBINDEX, TROPONINI in the last 168 hours. BNP (last 3 results)  Recent Labs  06/06/15 0512  BNP 425.4*    ProBNP (last 3 results) No results for input(s): PROBNP in the last 8760 hours.  CBG:  Recent Labs Lab 06/06/15 1157  GLUCAP 130*    Recent Results (from the past 240 hour(s))  Culture, blood (routine x 2)     Status: None   Collection Time: 06/06/15 11:20 AM  Result Value Ref Range Status   Specimen Description BLOOD RIGHT ARM  Final   Special Requests BOTTLES DRAWN AEROBIC AND ANAEROBIC 8CC  Final   Culture   Final    NO GROWTH 5 DAYS Performed at [facility]    Report Status 06/11/2015 FINAL  Final  Culture, blood (routine x 2)     Status: None   Collection Time: 06/06/15 11:28 AM  Result Value Ref Range Status   Specimen Description BLOOD LEFT ARM  Final   Special Requests BOTTLES DRAWN AEROBIC AND ANAEROBIC 10CC  Final   Culture   Final    NO GROWTH 5 DAYS Performed at [facility]    Report Status 06/11/2015 FINAL  Final     Studies: Dg Chest 2 View  06/11/2015   CLINICAL DATA:  Pneumonia, CHF and hypertension.  EXAM: CHEST  2 VIEW  COMPARISON:  06/09/2015  FINDINGS: The heart is enlarged but stable. There is tortuosity and calcification of the thoracic aorta. The lung bases show improved aeration. There are persistent small effusions. The bony thorax is stable.  IMPRESSION: Improving lung aeration with resolving edema, atelectasis and effusions.   Electronically Signed   By: Rudie Meyer M.D.   On: 06/11/2015 09:34   Dg Chest 2 View  06/09/2015   CLINICAL DATA:  Cough and  shortness of breath.  Acute kidney injury.  EXAM: CHEST  2 VIEW  COMPARISON:  06/06/2015, 05/31/2015.  FINDINGS: Cardiac silhouette markedly enlarged, unchanged. Interval worsening of interstitial and perihilar airspace pulmonary edema. Large bilateral pleural effusions, increased in size. Dense consolidation in the lower lobes, worse than on the prior examinations. Degenerative changes throughout the thoracic and visualized upper lumbar spine. Severe degenerative changes in the shoulders.  IMPRESSION: 1. Worsening interstitial and airspace pulmonary edema since the examination 3 days ago. 2. Large bilateral pleural effusions, increased in size since the examination 3 days ago. 3. Dense passive atelectasis in the lower lobes (favored over pneumonia) with worsening aeration.   Electronically Signed   By: Evangeline Dakin M.D.   On: 06/09/2015 15:46    Scheduled Meds: . cefTAZidime (FORTAZ)  IV  1 g Intravenous Q24H  . cholecalciferol  5,000 Units Oral Daily  . feeding supplement (ENSURE ENLIVE)  237 mL Oral BID BM  . ferrous sulfate  325 mg Oral Daily  . hydroxyurea  500 mg Oral Daily  . levothyroxine  50 mcg Oral QAC breakfast   Continuous Infusions:      Time spent: 25 minutes    Kaleigha Chamberlin A  [facility] Pager [phone] If 7PM-7AM, please contact night-coverage at [email or web address], password [facility] 06/11/2015, 2:45 PM  LOS: 11 days

## 2015-06-11 NOTE — Progress Notes (Signed)
1 Nonoliguric AKI, hemodynamically mediated due to low BP, arrhythmia, anemia, BP meds 2 CHF, improving 3 Anemia 4 Myeloproliferative Syndrome 5?PNA  Plan: 1 One dose os furosemide today  Subjective: Interval History: Better  Objective: Vital signs in last 24 hours: Temp:  [97.4 F (36.3 C)-97.8 F (36.6 C)] 97.8 F (36.6 C) (09/18 0332) Pulse Rate:  [82-92] 86 (09/18 0332) Resp:  [18-20] 18 (09/18 0332) BP: (111-127)/(47-66) 127/63 mmHg (09/18 0332) SpO2:  [94 %-97 %] 96 % (09/18 0332) Weight change:   Intake/Output from previous day: 09/17 0701 - 09/18 0700 In: 650 [P.O.:600; IV Piggyback:50] Out: 2900 [Urine:2900] Intake/Output this shift: Total I/O In: -  Out: 600 [Urine:600]  General appearance: alert and cooperative Resp: sl diminished at bases Cardio: RRR Extremities: edema 1+ bilat  Lab Results:  Recent Labs  06/10/15 0614 06/11/15 0515  WBC 27.7* 18.4*  HGB 10.1* 9.0*  HCT 31.6* 28.0*  PLT 553* 446*   BMET:  Recent Labs  06/10/15 0604 06/11/15 0515  NA 133* 136  K 4.3 3.5  CL 97* 100*  CO2 27 27  GLUCOSE 101* 100*  BUN 66* 73*  CREATININE 2.76* 2.37*  CALCIUM 8.1* 7.9*   No results for input(s): PTH in the last 72 hours. Iron Studies: No results for input(s): IRON, TIBC, TRANSFERRIN, FERRITIN in the last 72 hours. Studies/Results: Dg Chest 2 View  06/09/2015   CLINICAL DATA:  Cough and shortness of breath.  Acute kidney injury.  EXAM: CHEST  2 VIEW  COMPARISON:  06/06/2015, 05/31/2015.  FINDINGS: Cardiac silhouette markedly enlarged, unchanged. Interval worsening of interstitial and perihilar airspace pulmonary edema. Large bilateral pleural effusions, increased in size. Dense consolidation in the lower lobes, worse than on the prior examinations. Degenerative changes throughout the thoracic and visualized upper lumbar spine. Severe degenerative changes in the shoulders.  IMPRESSION: 1. Worsening interstitial and airspace pulmonary edema  since the examination 3 days ago. 2. Large bilateral pleural effusions, increased in size since the examination 3 days ago. 3. Dense passive atelectasis in the lower lobes (favored over pneumonia) with worsening aeration.   Electronically Signed   By: Evangeline Dakin M.D.   On: 06/09/2015 15:46    Scheduled: . cefTAZidime (FORTAZ)  IV  1 g Intravenous Q24H  . cholecalciferol  5,000 Units Oral Daily  . feeding supplement (ENSURE ENLIVE)  237 mL Oral BID BM  . ferrous sulfate  325 mg Oral Daily  . hydroxyurea  1,000 mg Oral Daily  . levothyroxine  50 mcg Oral QAC breakfast    LOS: 11 days   POWELL,ALVIN C 06/11/2015,8:50 AM

## 2015-06-12 LAB — BASIC METABOLIC PANEL
ANION GAP: 9 (ref 5–15)
BUN: 62 mg/dL — ABNORMAL HIGH (ref 6–20)
CALCIUM: 7.8 mg/dL — AB (ref 8.9–10.3)
CO2: 28 mmol/L (ref 22–32)
CREATININE: 2.03 mg/dL — AB (ref 0.61–1.24)
Chloride: 97 mmol/L — ABNORMAL LOW (ref 101–111)
GFR, EST AFRICAN AMERICAN: 32 mL/min — AB (ref >60)
GFR, EST NON AFRICAN AMERICAN: 27 mL/min — AB (ref >60)
Glucose, Bld: 103 mg/dL — ABNORMAL HIGH (ref 65–99)
Potassium: 3.2 mmol/L — ABNORMAL LOW (ref 3.5–5.1)
SODIUM: 134 mmol/L — AB (ref 135–145)

## 2015-06-12 LAB — CBC
HCT: 28.9 % — ABNORMAL LOW (ref 39.0–52.0)
Hemoglobin: 9.1 g/dL — ABNORMAL LOW (ref 13.0–17.0)
MCH: 28.5 pg (ref 26.0–34.0)
MCHC: 31.5 g/dL (ref 30.0–36.0)
MCV: 90.6 fL (ref 78.0–100.0)
PLATELETS: 444 10*3/uL — AB (ref 150–400)
RBC: 3.19 MIL/uL — ABNORMAL LOW (ref 4.22–5.81)
RDW: 22.1 % — AB (ref 11.5–15.5)
WBC: 17 10*3/uL — AB (ref 4.0–10.5)

## 2015-06-12 MED ORDER — POTASSIUM CHLORIDE CRYS ER 20 MEQ PO TBCR
40.0000 meq | EXTENDED_RELEASE_TABLET | Freq: Two times a day (BID) | ORAL | Status: DC
  Administered 2015-06-12: 40 meq via ORAL
  Filled 2015-06-12: qty 2

## 2015-06-12 MED ORDER — POTASSIUM CHLORIDE CRYS ER 20 MEQ PO TBCR
20.0000 meq | EXTENDED_RELEASE_TABLET | Freq: Once | ORAL | Status: AC
  Administered 2015-06-12: 20 meq via ORAL
  Filled 2015-06-12: qty 1

## 2015-06-12 MED ORDER — FUROSEMIDE 40 MG PO TABS
40.0000 mg | ORAL_TABLET | Freq: Every day | ORAL | Status: DC
  Administered 2015-06-13: 40 mg via ORAL
  Filled 2015-06-12: qty 1

## 2015-06-12 NOTE — Progress Notes (Signed)
CSW continuing to follow.   Pt planned for [facility] upon discharge.  Per MD, pt not yet medically ready for discharge.  CSW sent updated clinicals to [facility] and updated facility via telephone.  CSW to continue to follow to provide support and assist with pt disposition needs.  Alison Murray, MSW, [place] [facility] [phone]

## 2015-06-12 NOTE — Progress Notes (Signed)
Patient ID: Jack Schultz, male   DOB: Nov 07, 1923, 81 y.o.   MRN: [id number]         [employer] [facility] for Infectious Disease    Date of Admission:  05/31/2015           Day 6 ceftazidime  Principal Problem:   Fever Active Problems:   Symptomatic anemia   Acute kidney injury   Frequent falls   Complete heart block   Acute pain of left shoulder   Cellulitis of arm, left   Essential thrombocytosis   Essential hypertension   Vitamin D deficiency   Hypothyroidism   Hyperkalemia   Hyponatremia   Leucocytosis   Blood poisoning   Acute pericarditis   Atrial fibrillation   Absolute anemia   Acute confusional state   Malnutrition of moderate degree   Abdominal distension   HCAP (healthcare-associated pneumonia)   . cefTAZidime (FORTAZ)  IV  1 g Intravenous Q24H  . cholecalciferol  5,000 Units Oral Daily  . feeding supplement (ENSURE ENLIVE)  237 mL Oral BID BM  . ferrous sulfate  325 mg Oral Daily  . hydroxyurea  500 mg Oral Daily  . levothyroxine  50 mcg Oral QAC breakfast  . potassium chloride  40 mEq Oral BID    SUBJECTIVE: He states that he is tired after walking in the hall today but overall is feeling better. He still has some residual cough occasionally productive of thin white sputum. He denies any shortness of breath while walking. He is not having any pain or stiffness in his left hand  Review of Systems: Constitutional: No fever, chills or sweats  Past Medical History  Diagnosis Date  . Hypertension   . Hyperlipidemia   . Pre-diabetes   . Thyroid disease   . Thrombocytosis   . GERD (gastroesophageal reflux disease)   . Vitamin D deficiency     Social History  Substance Use Topics  . Smoking status: Former Smoker    Quit date: 09/24/1975  . Smokeless tobacco: Current User  . Alcohol Use: No    Family History  Problem Relation Age of Onset  . Hypertension Father   . Heart disease Father    Allergies  Allergen Reactions  . Ace Inhibitors  Cough    OBJECTIVE: Filed Vitals:   06/11/15 1353 06/11/15 2205 06/12/15 0620 06/12/15 1530  BP: 123/58 131/53 139/65 123/56  Pulse: 89 96 91 91  Temp: 97.3 F (36.3 C) 97.7 F (36.5 C) 97.7 F (36.5 C) 97.5 F (36.4 C)  TempSrc: Oral Oral Oral Oral  Resp: 20 24 20 20   Height:      Weight:   165 lb 2 oz (74.9 kg)   SpO2: 96% 95% 96% 97%   Body mass index is 24.37 kg/(m^2).  General: He is alert and talkative. His wife is at the bedside. Oral: No oropharyngeal lesions.  Lungs: clear anteriorly Cor: irregular S1 and S2 without murmur or rubs  Abdomen: soft and nontender Joints and extremities: No erythema and swelling on the dorsum of his left hand.    Lab Results Lab Results  Component Value Date   WBC 17.0* 06/12/2015   HGB 9.1* 06/12/2015   HCT 28.9* 06/12/2015   MCV 90.6 06/12/2015   PLT 444* 06/12/2015    Lab Results  Component Value Date   CREATININE 2.03* 06/12/2015   BUN 62* 06/12/2015   NA 134* 06/12/2015   K 3.2* 06/12/2015   CL 97* 06/12/2015   CO2 28 06/12/2015  Lab Results  Component Value Date   ALT 24 05/31/2015   AST 49* 05/31/2015   ALKPHOS 103 05/31/2015   BILITOT 1.9* 05/31/2015     Microbiology: Recent Results (from the past 240 hour(s))  Culture, blood (routine x 2)     Status: None   Collection Time: 06/06/15 11:20 AM  Result Value Ref Range Status   Specimen Description BLOOD RIGHT ARM  Final   Special Requests BOTTLES DRAWN AEROBIC AND ANAEROBIC 8CC  Final   Culture   Final    NO GROWTH 5 DAYS Performed at [facility]    Report Status 06/11/2015 FINAL  Final  Culture, blood (routine x 2)     Status: None   Collection Time: 06/06/15 11:28 AM  Result Value Ref Range Status   Specimen Description BLOOD LEFT ARM  Final   Special Requests BOTTLES DRAWN AEROBIC AND ANAEROBIC 10CC  Final   Culture   Final    NO GROWTH 5 DAYS Performed at [facility]    Report Status 06/11/2015 FINAL  Final   CHEST 2 VIEW  06/08/2016  COMPARISON: 06/06/2015, 05/31/2015.  FINDINGS: Cardiac silhouette markedly enlarged, unchanged. Interval worsening of interstitial and perihilar airspace pulmonary edema. Large bilateral pleural effusions, increased in size. Dense consolidation in the lower lobes, worse than on the prior examinations. Degenerative changes throughout the thoracic and visualized upper lumbar spine. Severe degenerative changes in the shoulders.  IMPRESSION: 1. Worsening interstitial and airspace pulmonary edema since the examination 3 days ago. 2. Large bilateral pleural effusions, increased in size since the examination 3 days ago. 3. Dense passive atelectasis in the lower lobes (favored over pneumonia) with worsening aeration.   Electronically Signed  By: Evangeline Dakin M.D.  On: 06/09/2015 15:46  ASSESSMENT: He has improved on empiric therapy for possible healthcare associated pneumonia.  PLAN: 1. Continue ceftazidime 1 more day  Michel Bickers, MD [facility] [phone] pager   [phone] cell 06/12/2015, 4:32 PM

## 2015-06-12 NOTE — Progress Notes (Signed)
[facility] PROGRESS NOTE  Jack Schultz YQM:[id number] DOB: 09-06-1924 DOA: 05/31/2015 PCP: [name], MD   Brief narrative 81 year old male with history of hypertension, hyperlipidemia, thrombocytosis (on hydroxyurea, sees Dr. [name]), recurrent epistaxis for to be related to myeloproliferative disorder and aspirin therapy hypothyroidism, iron deficiency anemia and GERD brought to the ED by family as patient has been progressively getting weak for the past 4 weeks with poor by mouth intake and recurrent falls. Patient reports having frequent falls without any loss of consciousness or sustaining any injury. He was seen by his PCP on 9/1 days ago and was found to have cellulitis of his distal left forearm and prescribed a 2 weeks course of Keflex. He reports that since then he has been having pain in his left arm and shoulder. Family also reports patient having off-and-on diffuse abdominal pain past 1 week. No associated nausea and vomiting or bowel symptoms. Patient denies headache, dizziness, fever, chills, nausea , vomiting, chest pain, palpitations, SOB, abdominal pain, bowel or urinary symptoms. Denies change in weight but has poor appetite for last 3 weeks.  Course in the ED Patient blood pressure was low normal at 94/48 mmHg O2 sat of 89% on room air. Blood will done showed significant leukocytosis with WBC of 17.6, hemoglobin of 7.4 (baseline hemoglobin of 9-10), clumped platelets. Chemistry showed sodium of 129, potassium 5.4, chloride of 97, BUN of 42 and creatinine 1.48. Glucose 119. Chest x-ray and UA were unremarkable. Hospitalist admission requested for further management   Assessment/Plan: Sepsis Sepsis pathway initiated on admission. Blood cultures negative. 2-D echo does not comment on possible vegetations. Elevated ESR and CRP. Given diffuse ST elevation with pericardia effusion on echo acute myopericarditis is of concern.  -X-ray of the left shoulder and  wrist negative for effusion. -Appreciate ID recommendations.  -started ceftazidime 9-14 , discussed with Dr. Megan Salon.  -Continue with ceftazidime, treating for PNA. Plan to continue IV antibiotics for 1 more day. Dr Megan Salon to reassess today .  -Afebrile for  More than 48 hours.   Acute encephalopathy; Related to fever, medications.  Patient was agitated . He received ativan and haldol at 2 am and 4 am.  Resolved, back to baseline.   Respiratory failure; Hypoxemia:  Chest x ray with pleural effusion.  Patient received lasix 9-16. Plan to repeat chest x ray 9-18. Follow nephrology recommendation regarding lasix.  Patient to received another dose of lasix 9-18. Chest x ray with small effusion.   Acute kidney injury, hyponatremia -Possibly due to dehydration and sepsis initially. Avoid nephrotoxins. .  Repeat UA negative for infection. . Repeat renal US 9-11 bordeline increased cortical diseases.  -Renal function got worse 9-15.   -Discussed with Dr Jana Hakim, can continue with hydroxyurea.   -CT showed spleen is displacing right kidney.  -appreciated Dr Florene Glen help. Cr has decreased to 2.0.  -will follow renal recommendation regarding diuretic  Essential thrombocytosis, Leukocytosis/  -On hydroxyurea as outpatient. (Sees Dr. [name]). Has history of recurrent epistaxis suspected due to myeloproliferative disorder and aspirin therapy. -Patient with persistent leukocytosis. In reviewing records, he had in peripheral smear blast Metas and myelos labs 04-25-2015.  -Consulted hematology due to leukocytoses and history of myeloproliferative diseases.  -LDH 184, peripheral smear review by hematology no significant left shift.  -started on Agrylin 9-12. Discontinue due to rash. Management per oncologist  -follow WBC trend.  -Hydroxyurea resume 9-14. -WBC trending down.    Abdominal distension; secondary to Constipation;  CT abdomen and pelvis no acute  abnormalities, no abscess, no  perforation. Splenomegaly displacing right kidney.  Having multiple BM. Will change miralax to PRN.   Left hand and shoulder pain X-rays unremarkable. Has limited mobility of the left arm due to shoulder pain.  uric acid at 7, normal  Resume antibiotics.  Ortho consulted. No evidence of infection per ortho.   Complete heart block Continue to monitor on telemetry. Discontinued metoprolol. Heart rate maintained in the 70s-80s. Cardiology following.  2-D echo with normal EF. No sings of  possible vegetation. Has trivial pericardial effusion. Ideally would need a pacemaker but not plan for now given underlying sepsis.  Elevated troponin  EKG done showed ST changes in inferior leads (? ST elevation). Troponin of 0.08 x 3. No chest pain.  Possibly due to  myopericarditis.   Acute pericarditis/ myopericarditis - Suspicion given diffuse ST elevation, trivial pericardial effusion on echo and underlying sepsis. However given his anemia will avoid high dose of aspirin and given his acute kidney injury cannot use NSAIDs or colchicine. -Denies chest pain.    Hypotension Secondary to sepsis and dehydration. Discontinued fluids  Symptomatic anemia Baseline hemoglobin of around 9. Hemoglobin of 7.2 upon presentation. Received 2 units PRBC. Improved posttransfusion.. Stool for occult blood negative. TSH and B12 normal.  Generalized weakness with frequent falls Appears to be multifactorial with sepsis, anemia and dehydration. A.m. cortisol normal. Normal TSH and B12. Need SNF at discharge   Abdominal pain Resolved. Abdominal ultrasound unremarkable. Showed splenomegaly.   hypothyroidism TSH is normal. Continue Synthroid  Prolonged Qtc (530)  Normal K and magnesium. Monitor on telemetry.   DVT prophylaxis: SCDs  Diet: Regular   Code Status: Full code Family Communication: Discussed with  daughter in aw. at bedside. They dont see reason for meeting, now that Dr [name] think  Myeloproliferative disorder is stable.  Disposition Plan: rehab when renal function improved   Consultants:  Cardiology  Infectious disease  Procedures:  CT head  Ultrasound abdomen  2-D echo   Antibiotics:  IV Zosyn 9/ 7-9/8  IV 9/14 Ceftazidime  HPI/Subjective: Had a good normal BM today.  Denies dyspnea   Objective: Filed Vitals:   06/12/15 0620  BP: 139/65  Pulse: 91  Temp: 97.7 F (36.5 C)  Resp: 20    Intake/Output Summary (Last 24 hours) at 06/12/15 1136 Last data filed at 06/12/15 0930  Gross per 24 hour  Intake   1130 ml  Output   3301 ml  Net  -2171 ml   Filed Weights   06/06/15 0435 06/11/15 0955 06/12/15 0620  Weight: 79.5 kg (175 lb 4.3 oz) 81.058 kg (178 lb 11.2 oz) 74.9 kg (165 lb 2 oz)    Exam:   General:   in no acute distress  HEENT:  moist oral mucosa, supple neck  Chest: no significant crackles.    CVS: Normal S1 and S2, no murmurs rub or gallop  GI: Soft, BS present, abdomen less distended, softer, NT  Musculoskeletal: Warm, edema, tender to pressure over left wrist, left arm and shoulder ( acute on chronic)  CNS: alert in no distress   Data Reviewed: Basic Metabolic Panel:  Recent Labs Lab 06/06/15 1120  06/08/15 0811 06/09/15 0504 06/10/15 0604 06/11/15 0515 06/12/15 0504  NA  --   < > 130* 129* 133* 136 134*  K  --   < > 4.2 4.4 4.3 3.5 3.2*  CL  --   < > 98* 97* 97* 100* 97*  CO2  --   < >  _0 GLUCOSE  --   < > 105* 97 101* 100* 103*  BUN  --   < > 46* 56* 66* 73* 62*  CREATININE  --   < > 2.12* 2.63* 2.76* 2.37* 2.03*  CALCIUM  --   < > 8.0* 7.7* 8.1* 7.9* 7.8*  MG 2.1  --   --   --   --   --   --   PHOS  --   --   --   --  4.5  --   --   < > = values in this interval not displayed. Liver Function Tests:  Recent Labs Lab 06/10/15 0604  ALBUMIN 2.2*   No results for input(s): LIPASE, AMYLASE in the last 168 hours.  Recent Labs Lab 06/06/15 1120  AMMONIA 17   CBC:  Recent  Labs Lab 06/05/15 1141  06/08/15 0811 06/09/15 0504 06/10/15 0614 06/11/15 0515 06/12/15 0504  WBC 21.5*  < > 36.9* 25.9* 27.7* 18.4* 17.0*  NEUTROABS 19.4*  --  35.5*  --   --   --   --   HGB 8.4*  < > 8.0* 7.7* 10.1* 9.0* 9.1*  HCT 27.0*  < > 25.4* 24.2* 31.6* 28.0* 28.9*  MCV 94.4  < > 92.4 91.3 89.5 89.2 90.6  PLT 554*  < > 486* 456* 553* 446* 444*  < > = values in this interval not displayed. Cardiac Enzymes: No results for input(s): CKTOTAL, CKMB, CKMBINDEX, TROPONINI in the last 168 hours. BNP (last 3 results)  Recent Labs  06/06/15 0512  BNP 425.4*    ProBNP (last 3 results) No results for input(s): PROBNP in the last 8760 hours.  CBG:  Recent Labs Lab 06/06/15 1157  GLUCAP 130*    Recent Results (from the past 240 hour(s))  Culture, blood (routine x 2)     Status: None   Collection Time: 06/06/15 11:20 AM  Result Value Ref Range Status   Specimen Description BLOOD RIGHT ARM  Final   Special Requests BOTTLES DRAWN AEROBIC AND ANAEROBIC 8CC  Final   Culture   Final    NO GROWTH 5 DAYS Performed at [facility]    Report Status 06/11/2015 FINAL  Final  Culture, blood (routine x 2)     Status: None   Collection Time: 06/06/15 11:28 AM  Result Value Ref Range Status   Specimen Description BLOOD LEFT ARM  Final   Special Requests BOTTLES DRAWN AEROBIC AND ANAEROBIC 10CC  Final   Culture   Final    NO GROWTH 5 DAYS Performed at [facility]    Report Status 06/11/2015 FINAL  Final     Studies: Dg Chest 2 View  06/11/2015   CLINICAL DATA:  Pneumonia, CHF and hypertension.  EXAM: CHEST  2 VIEW  COMPARISON:  06/09/2015  FINDINGS: The heart is enlarged but stable. There is tortuosity and calcification of the thoracic aorta. The lung bases show improved aeration. There are persistent small effusions. The bony thorax is stable.  IMPRESSION: Improving lung aeration with resolving edema, atelectasis and effusions.   Electronically Signed   By: Marijo Sanes M.D.   On: 06/11/2015 09:34    Scheduled Meds: . cefTAZidime (FORTAZ)  IV  1 g Intravenous Q24H  . cholecalciferol  5,000 Units Oral Daily  . feeding supplement (ENSURE ENLIVE)  237 mL Oral BID BM  . ferrous sulfate  325 mg Oral Daily  . hydroxyurea  500  mg Oral Daily  . levothyroxine  50 mcg Oral QAC breakfast  . potassium chloride  40 mEq Oral BID   Continuous Infusions:      Time spent: 25 minutes    Lyndsay Talamante A  [facility] Pager [phone] If 7PM-7AM, please contact night-coverage at [email or web address], password [facility] 06/12/2015, 11:36 AM  LOS: 12 days

## 2015-06-12 NOTE — Progress Notes (Signed)
IP PROGRESS NOTE  Subjective:   Jack Schultz is well-known to me with a history of a myeloproliferative disorder. He was admitted 05/31/2015 with weakness and recurrent falls after treatment with Keflex for a left arm cellulitis. He was found to have severe anemia on hospital admission.  Jack Schultz developed acute renal failure during this hospital admission. He reports feeling well at present. He has a good appetite and reports ambulating. He would like to go home. Hydroxyurea was resumed on 06/11/2015.    Objective: Vital signs in last 24 hours: Blood pressure 139/65, pulse 91, temperature 97.7 F (36.5 C), temperature source Oral, resp. rate 20, height _0  (1.753 m), weight 165 lb 2 oz (74.9 kg), SpO2 96 %.  Intake/Output from previous day: 09/18 0701 - 09/19 0700 In: 890 [P.O.:840; IV Piggyback:50] Out: 3900 [Urine:3900]  Physical Exam:  HEENT: No thrush or ulcers Lungs: Clear bilaterally Cardiac: Irregular, positive rub Abdomen: No hepatosplenomegaly, nontender Extremities: Trace ankle edema bilaterally Skin: Acne type rash over the back, small ecchymoses at the forearm bilaterally    Lab Results:  Recent Labs  06/11/15 0515 06/12/15 0504  WBC 18.4* 17.0*  HGB 9.0* 9.1*  HCT 28.0* 28.9*  PLT 446* 444*    BMET  Recent Labs  06/11/15 0515 06/12/15 0504  NA 136 134*  K 3.5 3.2*  CL 100* 97*  CO2 27 28  GLUCOSE 100* 103*  BUN 73* 62*  CREATININE 2.37* 2.03*  CALCIUM 7.9* 7.8*    Studies/Results: Dg Chest 2 View  06/11/2015   CLINICAL DATA:  Pneumonia, CHF and hypertension.  EXAM: CHEST  2 VIEW  COMPARISON:  06/09/2015  FINDINGS: The heart is enlarged but stable. There is tortuosity and calcification of the thoracic aorta. The lung bases show improved aeration. There are persistent small effusions. The bony thorax is stable.  IMPRESSION: Improving lung aeration with resolving edema, atelectasis and effusions.   Electronically Signed   By: Marijo Sanes  M.D.   On: 06/11/2015 09:34    Medications: I have reviewed the patient's current medications.  Assessment/Plan:  1. Essential thrombocytosis-maintained on hydroxyurea for years prior to hospital admission  2. Leukocytosis-progressive during this hospital admission, likely secondary to being maintained off of hydroxyurea, improved  3. Pericarditis  4. Sepsis syndrome, probable pneumonia-antibiotics as recommended by infectious disease  5. Anemia secondary to the underlying myeloproliferative disorder, hydroxyurea, infection, and potentially chronic renal failure, status post red cell transfusions 06/01/2015 and 06/09/2015  6. Acute/chronic renal failure  7. History of her current epistaxis secondary to platelet dysfunction  Jack Schultz appears well today. He appears to have recovered from the acute illness prompting hospital admission. He has a chronic myeloproliferative disorder with chronic associated anemia. The anemia was likely complicated by infection. I have a low clinical suspicion for progression to myelodysplasia, leukemia, or the fibrotic phase of the myeloproliferative disorder. I do not recommend a bone marrow biopsy at present. We can consider a trial of erythropoietin if the anemia progresses as an outpatient.  Recommendations:  1. Continue hydroxyurea at the current dose  2. Outpatient follow-up at the [facility] in the next one to 2 weeks     LOS: 12 days   [facility], [place]  06/12/2015, 8:41 AM

## 2015-06-12 NOTE — Progress Notes (Signed)
Subjective: No CP, dizziness or SOB  Objective: Vital signs in last 24 hours: Temp:  [97.3 F (36.3 C)-97.7 F (36.5 C)] 97.7 F (36.5 C) (09/19 0620) Pulse Rate:  [89-101] 91 (09/19 0620) Resp:  [20-24] 20 (09/19 0620) BP: (123-139)/(53-69) 139/65 mmHg (09/19 0620) SpO2:  [95 %-97 %] 96 % (09/19 0620) Weight:  [165 lb 2 oz (74.9 kg)-178 lb 11.2 oz (81.058 kg)] 165 lb 2 oz (74.9 kg) (09/19 0620) Last BM Date: 06/11/15  Intake/Output from previous day: 09/18 0701 - 09/19 0700 In: 890 [P.O.:840; IV Piggyback:50] Out: 3900 [Urine:3900] Intake/Output this shift:    Medications Scheduled Meds: . cefTAZidime (FORTAZ)  IV  1 g Intravenous Q24H  . cholecalciferol  5,000 Units Oral Daily  . feeding supplement (ENSURE ENLIVE)  237 mL Oral BID BM  . ferrous sulfate  325 mg Oral Daily  . hydroxyurea  500 mg Oral Daily  . levothyroxine  50 mcg Oral QAC breakfast   Continuous Infusions:  PRN Meds:.acetaminophen, acetaminophen **OR** [DISCONTINUED] acetaminophen, nitroGLYCERIN, ondansetron **OR** ondansetron (ZOFRAN) IV, polyethylene glycol  PE: General appearance: alert, cooperative, no distress and Laying flat in the bed Neck: no JVD Lungs: clear to auscultation bilaterally Heart: irregularly irregular rhythm and No MM Abdomen: +BS, nontender Extremities: 1+ posterior ankle edema Pulses: 2+ and symmetric Skin: Warm and dry Neurologic: Grossly normal  Lab Results:   Recent Labs  06/10/15 0614 06/11/15 0515 06/12/15 0504  WBC 27.7* 18.4* 17.0*  HGB 10.1* 9.0* 9.1*  HCT 31.6* 28.0* 28.9*  PLT 553* 446* 444*   BMET  Recent Labs  06/10/15 0604 06/11/15 0515 06/12/15 0504  NA 133* 136 134*  K 4.3 3.5 3.2*  CL 97* 100* 97*  CO2 27 27 28   GLUCOSE 101* 100* 103*  BUN 66* 73* 62*  CREATININE 2.76* 2.37* 2.03*  CALCIUM 8.1* 7.9* 7.8*   PT/INR No results for input(s): LABPROT, INR in the last 72 hours. Cholesterol No results for input(s): CHOL in the last 72  hours. Cardiac Enzymes Invalid input(s): TROPONIN,  CKMB  Studies/Results: @RISRSLT2 @   Assessment/Plan    Principal Problem:   Fever Active Problems:   Essential thrombocytosis   Essential hypertension   Vitamin D deficiency   Hypothyroidism   Symptomatic anemia   Hyperkalemia   Hyponatremia   Acute kidney injury   Frequent falls   Leucocytosis   Complete heart block   Acute pain of left shoulder   Cellulitis of arm, left   Blood poisoning   Acute pericarditis   Atrial fibrillation   Absolute anemia   Acute confusional state   Malnutrition of moderate degree   Abdominal distension   HCAP (healthcare-associated pneumonia)  1. Complete heart block transitioned to afib - initially in what appears to be CHB although HR was 60-70s on initial EKG, atenolol stopped, now in atrial flutter with HR 70s. Heart rate controlled - TSH normal, last dose 9/6. Echo 06/01/2015 EF 55%, mild AR, mild MR, moderate TR, PA peak pressure 60mmHg. Trace effusion.  - No plan for pacemaker therapy at this time. Similarly, no plan for cardioversion or antiarrhythmics since his flutter is asymptomatic and he has had neither bradycardia or tachycardia. Pericarditis may explain the conduction abnormalities and atrial flutter.   2. Pericarditis:  No CP.  EKG shows diffusely elevated J point ST segments suggestive of pericarditis. He is not having any significant pain however. At this time with increased creat, would avoid NSAIDS or colchicine. Repeat echo with only  trivial posterior effusion.  3. Acute diastolic CHF:  He looks euvolemic.  Net fluids:  -3.0L/-6.9L.  2D echo with normal LVF and central venous pressure was normal on echo. Cxray pending (Friday had large b/l pleural effusions and CHF).  IV lasix stopped.  Continue to monitor I/O's.     4. Atrial Fibrillation:  rate is controlled and he is asymptomatic. Given his current pericarditis and marked weakness and anemia, he is not an  optimal candidate for anticoagulation at this time.   5. Anemia: Previously transfused with 2 units PRBC. Hgb is 9.1 IM following.  6. Generalized weakness with frequent fall: 3 week progression. Prior to this, quite active.   7. AKI: Bump in SCr from 1.48-->2.12-->2.76-->2.37-->2.03(last). Now improving with diuresis.   8. Hypertension: BP stable.   9. Hypothyroidism: TSH normal  10. Leukocytosis: He is on antibiotics. ID following. 2D echo repeated with normal valves  11. Prolonged QT: Haldol stopped. Would avoid QT prolonging drugs.  12. SOB: feels better after IV lasix. cxray yesterday with improving lung aeration and resolving edema.  13. Pleural effusions - Improving.  3Liters out yesterday with -6.9 total.  Lungs sound clear.  Lasix stopped.   14.  Hypokalemia:  Replace this morning.   LOS: 12 days    HAGER, BRYAN PA-C 06/12/2015 8:04 AM  Patient examined chart reviewed.  Agree with above given age , anemia and presumed pericardial inflammation Not a candidate for anticoagulation. Also no indication for pacer at this time  Jenkins Rouge

## 2015-06-12 NOTE — Progress Notes (Signed)
ANTIBIOTIC CONSULT NOTE   Pharmacy Consult for Ceftazidime Indication: cellulitis vs pericarditis vs PNA  Allergies  Allergen Reactions  . Ace Inhibitors Cough    Patient Measurements: Height: 5\' 9"  (175.3 cm) Weight: 165 lb 2 oz (74.9 kg) IBW/kg (Calculated) : 70.7  Vital Signs: Temp: 97.7 F (36.5 C) (09/19 0620) Temp Source: Oral (09/19 0620) BP: 139/65 mmHg (09/19 0620) Pulse Rate: 91 (09/19 0620) Intake/Output from previous day: 09/18 0701 - 09/19 0700 In: 890 [P.O.:840; IV Piggyback:50] Out: 3900 [Urine:3900]  Labs:  Recent Labs  06/10/15 0604 06/10/15 0614 06/11/15 0515 06/12/15 0504  WBC  --  27.7* 18.4* 17.0*  HGB  --  10.1* 9.0* 9.1*  PLT  --  553* 446* 444*  CREATININE 2.76*  --  2.37* 2.03*   Estimated Creatinine Clearance: 24.7 mL/min (by C-G formula based on Cr of 2.03). No results for input(s): VANCOTROUGH, VANCOPEAK, VANCORANDOM, GENTTROUGH, GENTPEAK, GENTRANDOM, TOBRATROUGH, TOBRAPEAK, TOBRARND, AMIKACINPEAK, AMIKACINTROU, AMIKACIN in the last 72 hours.   Microbiology: Recent Results (from the past 720 hour(s))  Culture, blood (routine x 2)     Status: None   Collection Time: 05/31/15  3:40 PM  Result Value Ref Range Status   Specimen Description BLOOD LEFT ANTECUBITAL  Final   Special Requests BOTTLES DRAWN AEROBIC AND ANAEROBIC 5CC  Final   Culture   Final    NO GROWTH 5 DAYS Performed at [facility]    Report Status 06/05/2015 FINAL  Final  Culture, blood (routine x 2)     Status: None   Collection Time: 05/31/15  3:45 PM  Result Value Ref Range Status   Specimen Description BLOOD BLOOD RIGHT HAND  Final   Special Requests BOTTLES DRAWN AEROBIC AND ANAEROBIC 5CC  Final   Culture   Final    NO GROWTH 5 DAYS Performed at [facility]    Report Status 06/05/2015 FINAL  Final  MRSA PCR Screening     Status: None   Collection Time: 05/31/15  6:55 PM  Result Value Ref Range Status   MRSA by PCR NEGATIVE NEGATIVE Final   Comment:        The GeneXpert MRSA Assay (FDA approved for NASAL specimens only), is one component of a comprehensive MRSA colonization surveillance program. It is not intended to diagnose MRSA infection nor to guide or monitor treatment for MRSA infections.   Culture, blood (routine x 2)     Status: None   Collection Time: 06/06/15 11:20 AM  Result Value Ref Range Status   Specimen Description BLOOD RIGHT ARM  Final   Special Requests BOTTLES DRAWN AEROBIC AND ANAEROBIC 8CC  Final   Culture   Final    NO GROWTH 5 DAYS Performed at [facility]    Report Status 06/11/2015 FINAL  Final  Culture, blood (routine x 2)     Status: None   Collection Time: 06/06/15 11:28 AM  Result Value Ref Range Status   Specimen Description BLOOD LEFT ARM  Final   Special Requests BOTTLES DRAWN AEROBIC AND ANAEROBIC 10CC  Final   Culture   Final    NO GROWTH 5 DAYS Performed at [facility]    Report Status 06/11/2015 FINAL  Final    Medical History: Past Medical History  Diagnosis Date  . Hypertension   . Hyperlipidemia   . Pre-diabetes   . Thyroid disease   . Thrombocytosis   . GERD (gastroesophageal reflux disease)   . Vitamin D deficiency  Assessment: 45 yoM presented to ED on 9/7 with weakness, falls, and abdominal pain.  PMH includes HTN, HLD, thrombocytosis (on Hydroxyurea), epistaxis, iron deficiency anemia, GERD.  He was recently on Keflex for cellulitis of distal left forearm, started on 9/1.  Originally started on vancomycin and ceftriaxone, stopped 9/13 by ID.  Source remains unclear, but with new fevers and continually rising WBC, to restart on Ceftazidime alone per pharmacy (drug selection suggested by ID)  9/8 >> Zosyn >> 9/8   9/8 >> Vanc >> 9/12 9/8 >> ceftriaxone >> 9/12 9/14 >> ceftazidime >>   Temp: Remains afebrile WBC: Elevated, but decreasing Renal: Initial AKI had mostly resolved, then new more severe AKI; SCr slowly improving, CrCl ~ 25  ml/min. Good UOP.   9/7 blood: NGF 9/13 blood: NGTD  Goal of Therapy:  Appropriate abx dosing, eradication of infection.   Plan:  Day 6 antibiotics restart, day 12 total abx  Continue ceftazidime 1 g IV q24 hr  F/u renal function closely to adjust dose  Follow clinical course, renal function, culture results as available  Follow for de-escalation of antibiotics and LOT  Ralene Bathe, PharmD, BCPS 06/12/2015, 8:56 AM  Pager: [phone]

## 2015-06-12 NOTE — Progress Notes (Signed)
Physical Therapy Treatment Patient Details Name: Jack Schultz MRN: [id number] DOB: 1924-03-30 Today's Date: 06/12/2015    History of Present Illness 81 yo male admitted with fever, sepsis, heart block with possible need for pacemaker. Hx of HTN, bil shoulder pain (chronic).  Pt with confusion, agitation, left facial droop 9/13, unable to tolerate MRI, neurology following.    PT Comments    Pt assisted with ambulating in hallway.  Pt presents with posterior lean upon standing requiring time and cues to find BOS.  Pt able to improve ambulation distance today.  Continue to recommend SNF for continued rehab.  Follow Up Recommendations  SNF     Equipment Recommendations  Rolling walker with 5" wheels    Recommendations for Other Services       Precautions / Restrictions Precautions Precautions: Fall    Mobility  Bed Mobility Overal bed mobility: Needs Assistance Bed Mobility: Supine to Sit     Supine to sit: Min assist;HOB elevated     General bed mobility comments: slight assist for trunk upright  Transfers Overall transfer level: Needs assistance Equipment used: Rolling walker (2 wheeled) Transfers: Sit to/from Stand Sit to Stand: From elevated surface;Mod assist;+2 safety/equipment         General transfer comment: verbal cues for technique and weight shifting due to posterior lean upon standing, also to push down through UEs on RW to assist with stability  Ambulation/Gait Ambulation/Gait assistance: Min assist;+2 safety/equipment Ambulation Distance (Feet): 160 Feet Assistive device: Rolling walker (2 wheeled) Gait Pattern/deviations: Step-through pattern;Scissoring;Narrow base of support;Decreased stride length     General Gait Details: occasional scissoring observed, very narrow BOS with short steps, assist to steady, +2 for safety   Stairs            Wheelchair Mobility    Modified Rankin (Stroke Patients Only)       Balance Overall  balance assessment: Needs assistance         Standing balance support: Bilateral upper extremity supported Standing balance-Leahy Scale: Poor   Single Leg Stance - Right Leg: 5 Single Leg Stance - Left Leg: 5           High Level Balance Comments: SLS with RW, 5 seconds or less bilaterally    Cognition Arousal/Alertness: Awake/alert Behavior During Therapy: WFL for tasks assessed/performed Overall Cognitive Status: Within Functional Limits for tasks assessed                      Exercises      General Comments        Pertinent Vitals/Pain Pain Assessment: No/denies pain    Home Living                      Prior Function            PT Goals (current goals can now be found in the care plan section) Progress towards PT goals: Progressing toward goals    Frequency  Min 3X/week    PT Plan Current plan remains appropriate    Co-evaluation             End of Session Equipment Utilized During Treatment: Gait belt Activity Tolerance: Patient tolerated treatment well Patient left: in chair;with call bell/phone within reach;with chair alarm set;with family/visitor present     Time: 5697-9480 PT Time Calculation (min) (ACUTE ONLY): 15 min  Charges:  $Gait Training: 8-22 mins  G Codes:      LEMYRE,KATHrine E July 06, 2015, 1:29 PM Carmelia Bake, PT, DPT 07/06/2015 Pager: [phone]

## 2015-06-12 NOTE — Progress Notes (Signed)
Assessment: 1 Nonoliguric AKI- hemodynamically mediated due to low BP, arrhythmia, anemia, BP meds. Resolving.  2 CHF/ pulm edema - large diuresis, 11 liters UOP over last 3 days and is now under admit weight by 3kg. SOB resolved, lungs clear.  3 Anemia 4 Myeloproliferative Syndrome  Plan: 1 Will change to po lasix 40 bid (was on 40 daily prior to admission).  Would dc on 40 mg once or twice daily. Will sign off, please call as needed.   Subjective: Interval History: Better  Objective: Vital signs in last 24 hours: Temp:  [97.5 F (36.4 C)-97.7 F (36.5 C)] 97.5 F (36.4 C) (09/19 1530) Pulse Rate:  [91-96] 91 (09/19 1530) Resp:  [20-24] 20 (09/19 1530) BP: (123-139)/(53-65) 123/56 mmHg (09/19 1530) SpO2:  [95 %-97 %] 97 % (09/19 1530) Weight:  [74.9 kg (165 lb 2 oz)] 74.9 kg (165 lb 2 oz) (09/19 0620) Weight change:   Intake/Output from previous day: 09/18 0701 - 09/19 0700 In: 890 [P.O.:840; IV Piggyback:50] Out: 3900 [Urine:3900] Intake/Output this shift: Total I/O In: 290 [P.O.:240; IV Piggyback:50] Out: 736 [Urine:735; Stool:1]  General appearance: alert and cooperative Resp: sl diminished at bases Cardio: RRR Extremities: edema 1+ bilat  Lab Results:  Recent Labs  06/11/15 0515 06/12/15 0504  WBC 18.4* 17.0*  HGB 9.0* 9.1*  HCT 28.0* 28.9*  PLT 446* 444*   BMET:   Recent Labs  06/11/15 0515 06/12/15 0504  NA 136 134*  K 3.5 3.2*  CL 100* 97*  CO2 27 28  GLUCOSE 100* 103*  BUN 73* 62*  CREATININE 2.37* 2.03*  CALCIUM 7.9* 7.8*   No results for input(s): PTH in the last 72 hours. Iron Studies: No results for input(s): IRON, TIBC, TRANSFERRIN, FERRITIN in the last 72 hours. Studies/Results: Dg Chest 2 View  06/11/2015   CLINICAL DATA:  Pneumonia, CHF and hypertension.  EXAM: CHEST  2 VIEW  COMPARISON:  06/09/2015  FINDINGS: The heart is enlarged but stable. There is tortuosity and calcification of the thoracic aorta. The lung bases show improved  aeration. There are persistent small effusions. The bony thorax is stable.  IMPRESSION: Improving lung aeration with resolving edema, atelectasis and effusions.   Electronically Signed   By: Marijo Sanes M.D.   On: 06/11/2015 09:34    Scheduled: . cefTAZidime (FORTAZ)  IV  1 g Intravenous Q24H  . cholecalciferol  5,000 Units Oral Daily  . feeding supplement (ENSURE ENLIVE)  237 mL Oral BID BM  . ferrous sulfate  325 mg Oral Daily  . hydroxyurea  500 mg Oral Daily  . levothyroxine  50 mcg Oral QAC breakfast  . potassium chloride  40 mEq Oral BID    LOS: 12 days   SCHERTZ,ROBERT D 06/12/2015,4:44 PM

## 2015-06-13 LAB — BASIC METABOLIC PANEL
ANION GAP: 8 (ref 5–15)
BUN: 55 mg/dL — ABNORMAL HIGH (ref 6–20)
CHLORIDE: 100 mmol/L — AB (ref 101–111)
CO2: 30 mmol/L (ref 22–32)
Calcium: 7.9 mg/dL — ABNORMAL LOW (ref 8.9–10.3)
Creatinine, Ser: 1.58 mg/dL — ABNORMAL HIGH (ref 0.61–1.24)
GFR calc Af Amer: 43 mL/min — ABNORMAL LOW (ref >60)
GFR calc non Af Amer: 37 mL/min — ABNORMAL LOW (ref >60)
GLUCOSE: 103 mg/dL — AB (ref 65–99)
POTASSIUM: 3.8 mmol/L (ref 3.5–5.1)
Sodium: 138 mmol/L (ref 135–145)

## 2015-06-13 LAB — CBC
HEMATOCRIT: 29.4 % — AB (ref 39.0–52.0)
Hemoglobin: 8.9 g/dL — ABNORMAL LOW (ref 13.0–17.0)
MCH: 27.7 pg (ref 26.0–34.0)
MCHC: 30.3 g/dL (ref 30.0–36.0)
MCV: 91.6 fL (ref 78.0–100.0)
PLATELETS: 435 10*3/uL — AB (ref 150–400)
RBC: 3.21 MIL/uL — ABNORMAL LOW (ref 4.22–5.81)
RDW: 21.9 % — AB (ref 11.5–15.5)
WBC: 18.3 10*3/uL — ABNORMAL HIGH (ref 4.0–10.5)

## 2015-06-13 MED ORDER — POTASSIUM CHLORIDE CRYS ER 20 MEQ PO TBCR: 20.0000 meq | EXTENDED_RELEASE_TABLET | Freq: Every day | ORAL | Status: AC

## 2015-06-13 MED ORDER — ACETAMINOPHEN 650 MG RE SUPP: 650.0000 mg | Freq: Four times a day (QID) | RECTAL | Status: AC | PRN

## 2015-06-13 MED ORDER — ENSURE ENLIVE PO LIQD: 237.0000 mL | Freq: Two times a day (BID) | ORAL | Status: AC

## 2015-06-13 MED ORDER — POLYETHYLENE GLYCOL 3350 17 G PO PACK: 17.0000 g | PACK | Freq: Every day | ORAL | Status: AC | PRN

## 2015-06-13 MED ORDER — POTASSIUM CHLORIDE CRYS ER 20 MEQ PO TBCR
20.0000 meq | EXTENDED_RELEASE_TABLET | Freq: Once | ORAL | Status: AC
  Administered 2015-06-13: 20 meq via ORAL
  Filled 2015-06-13: qty 1

## 2015-06-13 NOTE — Discharge Planning (Signed)
Report called to [place] at [facility]. Instruction reviewed, questions, concerns denied

## 2015-06-13 NOTE — Progress Notes (Signed)
Subjective: No CP, SOB, orthopnea  Objective: Vital signs in last 24 hours: Temp:  [97.5 F (36.4 C)-98.7 F (37.1 C)] 97.5 F (36.4 C) (09/20 0414) Pulse Rate:  [84-91] 84 (09/20 0414) Resp:  [18-20] 19 (09/20 0414) BP: (123-134)/(56-82) 134/67 mmHg (09/20 0414) SpO2:  [97 %-99 %] 99 % (09/20 0414) Weight:  [166 lb 14.2 oz (75.7 kg)] 166 lb 14.2 oz (75.7 kg) (09/20 0414) Last BM Date: 06/11/15  Intake/Output from previous day: 09/19 0701 - 09/20 0700 In: 650 [P.O.:600; IV Piggyback:50] Out: 1811 [Urine:1810; Stool:1] Intake/Output this shift:    Medications Scheduled Meds: . cefTAZidime (FORTAZ)  IV  1 g Intravenous Q24H  . cholecalciferol  5,000 Units Oral Daily  . feeding supplement (ENSURE ENLIVE)  237 mL Oral BID BM  . ferrous sulfate  325 mg Oral Daily  . furosemide  40 mg Oral Daily  . hydroxyurea  500 mg Oral Daily  . levothyroxine  50 mcg Oral QAC breakfast   Continuous Infusions:  PRN Meds:.acetaminophen, acetaminophen **OR** [DISCONTINUED] acetaminophen, nitroGLYCERIN, ondansetron **OR** ondansetron (ZOFRAN) IV, polyethylene glycol  PE: General appearance: alert, cooperative, no distress and Laying flat in the bed Neck: no JVD Lungs: clear to auscultation bilaterally Heart: irregularly irregular rhythm and No MM.  + Rub Abdomen: +BS, nontender Extremities: No LEE Pulses: 2+ and symmetric Skin: Warm and dry Neurologic: Grossly normal  Lab Results:   Recent Labs  06/11/15 0515 06/12/15 0504 06/13/15 0500  WBC 18.4* 17.0* 18.3*  HGB 9.0* 9.1* 8.9*  HCT 28.0* 28.9* 29.4*  PLT 446* 444* 435*   BMET  Recent Labs  06/11/15 0515 06/12/15 0504 06/13/15 0500  NA 136 134* 138  K 3.5 3.2* 3.8  CL 100* 97* 100*  CO2 27 28 30   GLUCOSE 100* 103* 103*  BUN 73* 62* 55*  CREATININE 2.37* 2.03* 1.58*  CALCIUM 7.9* 7.8* 7.9*    Assessment/Plan  Principal Problem:   Fever Active Problems:   Essential thrombocytosis   Essential  hypertension   Vitamin D deficiency   Hypothyroidism   Symptomatic anemia   Hyperkalemia   Hyponatremia   Acute kidney injury   Frequent falls   Leucocytosis   Complete heart block   Acute pain of left shoulder   Cellulitis of arm, left   Blood poisoning   Acute pericarditis   Atrial fibrillation   Absolute anemia   Acute confusional state   Malnutrition of moderate degree   Abdominal distension   HCAP (healthcare-associated pneumonia)  1. Complete heart block transitioned to afib/flutter - initially in what appears to be CHB although HR was 60-70s on initial EKG, atenolol stopped, now in atrial Fib with HR 90s this morning.  Heart rate controlled - TSH normal. Echo 06/01/2015 EF 55%, mild AR, mild MR, moderate TR, PA peak pressure 7mmHg. Trace effusion.  - No plan for pacemaker therapy at this time. Similarly, no plan for cardioversion or antiarrhythmics since his fib is asymptomatic and he has had neither bradycardia or tachycardia. Pericarditis may explain the conduction abnormalities and atrial flutter.   2. Pericarditis:  No CP. EKG shows diffusely elevated J point ST segments suggestive of pericarditis. He does have a rub on exam.  He is not having any significant pain however. At this time with increased creat, would avoid NSAIDS or colchicine. Repeat echo with only trivial posterior effusion.  3. Acute diastolic CHF:  He looks euvolemic. Net fluids: -1.2L/-8.1L. 2D echo with normal LVF and central venous pressure was  normal on echo. CXR Friday had large b/l pleural effusions and CHF).  On PO lasix 40 daily. Continue to monitor I/O's.    4. Atrial Fibrillation:  rate is controlled and he is asymptomatic. Given his current pericarditis and marked weakness and anemia, he is not an optimal candidate for anticoagulation at this time.   5. Anemia: Previously transfused with 2 units PRBC. Hgb is 9.1 IM following.  6. Generalized weakness with frequent  fall: 3 week progression. Prior to this, quite active.   7. AKI: Improving.  1.58 this morning.  8. Hypertension: BP stable.   9. Hypothyroidism: TSH normal  10. Leukocytosis: He is on antibiotics. ID following. 2D echo repeated with normal valves  11. Prolonged QT: Haldol stopped. Would avoid QT prolonging drugs.  12. SOB: feels better after IV lasix. CXR 9/18 with improving lung aeration and resolving edema.  13. Pleural effusions - Improving. Now net fluids of -8.1L. Lungs sound clear.   14. Hypokalemia: Replace this morning.  We will arrange close follow up in the office.  Daily weight monitoring discussed.     LOS: 13 days    HAGER, BRYAN PA-C 06/13/2015 8:14 AM  Patient examined chart reviewed Ready for d/c to [facility] for PT Agree with no indication for pacer afib rates ok with no long pauses F/U with Dr Stanford Breed  Discussed care with daughter and patient  Jenkins Rouge

## 2015-06-13 NOTE — Clinical Social Work Placement (Signed)
   CLINICAL SOCIAL WORK PLACEMENT  NOTE  Date:  06/13/2015  Patient Details  Name: Jack Schultz MRN: [id number] Date of Birth: 06/11/1924  Clinical Social Work is seeking post-discharge placement for this patient at the [place] level of care (*CSW will initial, date and re-position this form in  chart as items are completed):  Yes   Patient/family provided with [facility] Work Department's list of facilities offering this level of care within the geographic area requested by the patient (or if unable, by the patient's family).  Yes   Patient/family informed of their freedom to choose among providers that offer the needed level of care, that participate in Medicare, Medicaid or managed care program needed by the patient, have an available bed and are willing to accept the patient.  Yes   Patient/family informed of [facility]'s ownership interest in [facility] and [facility], as well as of the fact that they are under no obligation to receive care at these facilities.  PASRR submitted to EDS on 06/05/15     PASRR number received on 06/05/15     Existing PASRR number confirmed on       FL2 transmitted to all facilities in geographic area requested by pt/family on 06/05/15     FL2 transmitted to all facilities within larger geographic area on       Patient informed that his/her managed care company has contracts with or will negotiate with certain facilities, including the following:        Yes   Patient/family informed of bed offers received.  Patient chooses bed at [place], [place]     Physician recommends and patient chooses bed at      Patient to be transferred to [place], [place] on 06/13/15.  Patient to be transferred to facility by ambulance [facility])     Patient family notified on 06/13/15 of transfer.  Name of family member notified:  pt and pt daughter-in-law notified at bedside     PHYSICIAN Please sign  FL2     Additional Comment:    _______________________________________________ Ladell Pier, LCSW 06/13/2015, 1:08 PM

## 2015-06-13 NOTE — Discharge Summary (Signed)
Physician Discharge Summary  Jack Schultz ZJQ:[id number] DOB: 03-29-1924 DOA: 05/31/2015  PCP: [name], MD  Admit date: 05/31/2015 Discharge date: 06/13/2015  Time spent: 35 minutes.   Recommendations for Outpatient Follow-up:  Needs repeat CBC, to follow Hb and WBC.  Needs to follow up with Dr Benay Spice for further care of anemia.  Needs B-met to follow renal function and electrolytes.   Discharge Diagnoses:    PNA Health care Associated.    Fever   Acute kidney injury   Essential thrombocytosis   Essential hypertension   Vitamin D deficiency   Hypothyroidism   Symptomatic anemia   Hyperkalemia   Hyponatremia   Frequent falls   Leucocytosis   Complete heart block   Acute pain of left shoulder   Cellulitis of arm, left   Acute pericarditis   Atrial fibrillation   Absolute anemia   Acute confusional state   Malnutrition of moderate degree   Abdominal distension   HCAP (healthcare-associated pneumonia)   Discharge Condition: Stable  Diet recommendation: Heart Healthy  Filed Weights   06/11/15 0955 06/12/15 0620 06/13/15 0414  Weight: 81.058 kg (178 lb 11.2 oz) 74.9 kg (165 lb 2 oz) 75.7 kg (166 lb 14.2 oz)    History of present illness:  81 year old male with history of hypertension, hyperlipidemia, thrombocytosis (on hydroxyurea, sees Dr. [name]), recurrent epistaxis for to be related to myeloproliferative disorder and aspirin therapy hypothyroidism, iron deficiency anemia and GERD brought to the ED by family as patient has been progressively getting weak for the past 4 weeks with poor by mouth intake and recurrent falls. Patient reports having frequent falls without any loss of consciousness or sustaining any injury. He was seen by his PCP on 9/1 days ago and was found to have cellulitis of his distal left forearm and prescribed a 2 weeks course of Keflex. He reports that since then he has been having pain in his left arm and shoulder. Family also  reports patient having off-and-on diffuse abdominal pain past 1 week. No associated nausea and vomiting or bowel symptoms. Patient denies headache, dizziness, fever, chills, nausea , vomiting, chest pain, palpitations, SOB, abdominal pain, bowel or urinary symptoms. Denies change in weight but has poor appetite for last 3 weeks. Patient blood pressure was low normal at 94/48 mmHg O2 sat of 89% on room air. Blood will done showed significant leukocytosis with WBC of 17.6, hemoglobin of 7.4 (baseline hemoglobin of 9-10), clumped platelets. Chemistry showed sodium of 129, potassium 5.4, chloride of 97, BUN of 42 and creatinine 1.48. Glucose 119. Chest x-ray and UA were unremarkable.   Hospital Course:  Course in the ED  Assessment/Plan: Sepsis Sepsis pathway initiated on admission. Blood cultures negative. 2-D echo does not comment on possible vegetations. Elevated ESR and CRP. Given diffuse ST elevation with pericardia effusion on echo acute myopericarditis is of concern.  -X-ray of the left shoulder and wrist negative for effusion. -Appreciate ID recommendations.  -Patient was treated with ceftazidime for Health care associated PNA> his fever resolved. WBC elevated but there is component of Myeloproliferative disorder.  -Afebrile for More than 72 hours.  Acute encephalopathy; Related to fever, medications.  Patient was agitated . He received ativan and haldol prior to AMS>  Resolved, back to baseline.   Respiratory failure; Hypoxemia: related to PNA, HF and pleural effusion./  Chest x ray with pleural effusion. repeated Chest x ray with decreased size, only small effusion after diuretics.  Resume home oral lasix.   Acute kidney  injury, hyponatremia: suspect related to hemodynamics, Hypotension, Arrhythmia.  -Possibly due to dehydration and sepsis initially. Avoid nephrotoxins. . Repeat UA negative for infection. . Repeat renal US 9-11 bordeline increased cortical diseases.  -Renal  function got worse 9-15.  -Discussed with Dr Jana Hakim, can continue with hydroxyurea.  -CT showed spleen is displacing right kidney.  -appreciated Dr Florene Glen help. Cr has decreased to 1.5 -discharge on home dose lasix. Needs B-et to follow renal function.   Essential thrombocytosis, Leukocytosis/  -On hydroxyurea as outpatient. (Sees Dr. [name]). Has history of recurrent epistaxis suspected due to myeloproliferative disorder and aspirin therapy. -Patient with persistent leukocytosis. In reviewing records, he had in peripheral smear blast Metas and myelos labs 04-25-2015.  -Consulted hematology due to leukocytoses and history of myeloproliferative diseases.  -LDH 184, peripheral smear review by hematology no significant left shift.  -started on Agrylin 9-12. Discontinue due to rash. Management per oncologist  -WBC started to decreased after patient was started again on Hydroxyuria. WBC at 18 peak at 37.  -Hydroxyurea resume 9-14. -WBC trending down. Stable.    Abdominal distension; secondary to Constipation;  CT abdomen and pelvis no acute abnormalities, no abscess, no perforation. Splenomegaly displacing right kidney.  Constipation has resolved.   Left hand and shoulder pain X-rays unremarkable. Has limited mobility of the left arm due to shoulder pain.  uric acid at 7, normal  Ortho consulted. No evidence of infection per ortho.   Complete heart block Continue to monitor on telemetry. Discontinued metoprolol. Heart rate maintained in the 70s-80s. Cardiology following. 2-D echo with normal EF. No sings of possible vegetation. Has trivial pericardial effusion. Ideally would need a pacemaker but not plan for now given underlying sepsis.  Elevated troponin EKG done showed ST changes in inferior leads (? ST elevation). Troponin of 0.08 x 3. No chest pain. Possibly due to myopericarditis.   Acute pericarditis/ myopericarditis - Suspicion given diffuse ST elevation, trivial  pericardial effusion on echo and underlying sepsis. However given his anemia will avoid high dose of aspirin and given his acute kidney injury cannot use NSAIDs or colchicine. -Denies chest pain.   Hypotension Secondary to sepsis and dehydration. Resolved.   Symptomatic anemia Baseline hemoglobin of around 9. Hemoglobin of 7.2 upon presentation. Received 2 units PRBC. Improved posttransfusion.. Stool for occult blood negative. TSH and B12 normal.  Generalized weakness with frequent falls Appears to be multifactorial with sepsis, anemia and dehydration. A.m. cortisol normal. Normal TSH and B12. Need SNF at discharge   Abdominal pain Resolved. Abdominal ultrasound unremarkable. Showed splenomegaly.   hypothyroidism TSH is normal. Continue Synthroid  Prolonged Qtc (530)  Normal K and magnesium. Monitor on telemetry.   Procedures:  CT head  Ultrasound abdomen  2-D echo  Consultations:  ID  Cardiology  Nephrology  Discharge Exam: Filed Vitals:   06/13/15 0414  BP: 134/67  Pulse: 84  Temp: 97.5 F (36.4 C)  Resp: 19    General: Alert in no distress Cardiovascular: S 1, S 2 RRR Respiratory: CTA  Discharge Instructions   Discharge Instructions    Diet - low sodium heart healthy    Complete by:  As directed      Increase activity slowly    Complete by:  As directed           Current Discharge Medication List    START taking these medications   Details  acetaminophen (TYLENOL) 650 MG suppository Place 1 suppository (650 mg total) rectally every 6 (six) hours as needed  for mild pain (or Fever >/= 101). Qty: 12 suppository, Refills: 0    feeding supplement, ENSURE ENLIVE, (ENSURE ENLIVE) LIQD Take 237 mLs by mouth 2 (two) times daily between meals. Qty: 237 mL, Refills: 12    polyethylene glycol (MIRALAX / GLYCOLAX) packet Take 17 g by mouth daily as needed for mild constipation. Qty: 14 each, Refills: 0    potassium chloride SA (K-DUR,KLOR-CON) 20  MEQ tablet Take 1 tablet (20 mEq total) by mouth daily. Qty: 5 tablet, Refills: 0      CONTINUE these medications which have NOT CHANGED   Details  aspirin 81 MG tablet Take 81 mg by mouth daily.   Associated Diagnoses: Essential thrombocytosis    Cholecalciferol (VITAMIN D PO) Take 5,000 Units by mouth daily.    ferrous sulfate 325 (65 FE) MG tablet Take 325 mg by mouth daily with breakfast.   Associated Diagnoses: Essential thrombocytosis    furosemide (LASIX) 40 MG tablet TAKE 1 TABLET EVERY DAY Qty: 90 tablet, Refills: 1    hydroxyurea (HYDREA) 500 MG capsule TAKE ONE CAPSULE BY MOUTH EVERY DAY Qty: 30 capsule, Refills: 1    levothyroxine (SYNTHROID, LEVOTHROID) 50 MCG tablet TAKE 1 TABLET BY MOUTH EVERY DAY Qty: 90 tablet, Refills: 3      STOP taking these medications     atenolol (TENORMIN) 50 MG tablet      cephALEXin (KEFLEX) 500 MG capsule        Allergies  Allergen Reactions  . Ace Inhibitors Cough   Follow-up Information    Follow up with Richardson Dopp, PA-C On 06/23/2015.   Specialties:  Physician Assistant, Radiology, Interventional Cardiology   Why:  12:10 PM   Contact information:   [address] [address] [phone]       Follow up with [name], MD In 1 week.   Specialty:  Internal Medicine   Contact information:   [address] [place] [address] [phone]        The results of significant diagnostics from this hospitalization (including imaging, microbiology, ancillary and laboratory) are listed below for reference.    Significant Diagnostic Studies: Ct Abdomen Pelvis Wo Contrast  06/08/2015   CLINICAL DATA:  Progressively getting weak for the past 4 weeks with poor by mouth intake and recurrent falls. Patient reports having frequent falls without any loss of consciousness or sustaining any injury, Family also reports patient having diffuse abdominal pain past week.  EXAM:  CT ABDOMEN AND PELVIS WITHOUT CONTRAST  TECHNIQUE: Multidetector CT imaging of the abdomen and pelvis was performed following the standard protocol without IV contrast.  COMPARISON:  Chest x-ray 06/06/2015 and abdominal film 06/08/2015  FINDINGS: Lung bases demonstrate moderate bilateral pleural effusions with associated compressive lower lobe atelectasis. Subtle hazy airspace density over the most superior image in the region of the lingula as cannot exclude infection. There is cardiomegaly with a small pericardial effusion  Abdominal images demonstrate moderate splenomegaly. The enlarged spleen displaces the left kidney anterior medially.  The liver, pancreas, gallbladder and adrenal glands are within normal. Kidneys are normal in size without hydronephrosis or nephrolithiasis. No focal renal mass. Ureters are normal. Appendix is normal.  There is mild calcified plaque over the abdominal aorta and iliac arteries.  Very small amount of free fluid in the pericolic gutters and just above the bladder.  Small bowel is within normal. There is moderate fecal retention over the rectum as the colon is otherwise within  normal.  Pelvic images demonstrate the bladder and prostate to be within normal.  There is mild subcutaneous edema over the lateral and dependent abdomen/pelvis.  There is a focus of heterotopic bone versus myositis ossificans over the left adductor muscle group as well as anterior right hip musculature. There are moderate degenerative changes throughout the spine with multilevel disc disease throughout the lumbar spine. Several vertebral body hemangiomas. There mild degenerate changes of the hips.  IMPRESSION: No acute findings in the abdomen/ pelvis.  Moderate fecal retention over the rectum without evidence of obstruction.  Moderate splenomegaly which displaces the kidney anterior medially.  Moderate size bilateral pleural effusions with associated compressive atelectasis in the lower lobes. Possible  airspace process over the most superior image over the lingula as cannot exclude infection  Cardiomegaly with small pericardial effusion.   Electronically Signed   By: Marin Olp M.D.   On: 06/08/2015 16:12   Dg Chest 2 View  06/11/2015   CLINICAL DATA:  Pneumonia, CHF and hypertension.  EXAM: CHEST  2 VIEW  COMPARISON:  06/09/2015  FINDINGS: The heart is enlarged but stable. There is tortuosity and calcification of the thoracic aorta. The lung bases show improved aeration. There are persistent small effusions. The bony thorax is stable.  IMPRESSION: Improving lung aeration with resolving edema, atelectasis and effusions.   Electronically Signed   By: Marijo Sanes M.D.   On: 06/11/2015 09:34   Dg Chest 2 View  06/09/2015   CLINICAL DATA:  Cough and shortness of breath.  Acute kidney injury.  EXAM: CHEST  2 VIEW  COMPARISON:  06/06/2015, 05/31/2015.  FINDINGS: Cardiac silhouette markedly enlarged, unchanged. Interval worsening of interstitial and perihilar airspace pulmonary edema. Large bilateral pleural effusions, increased in size. Dense consolidation in the lower lobes, worse than on the prior examinations. Degenerative changes throughout the thoracic and visualized upper lumbar spine. Severe degenerative changes in the shoulders.  IMPRESSION: 1. Worsening interstitial and airspace pulmonary edema since the examination 3 days ago. 2. Large bilateral pleural effusions, increased in size since the examination 3 days ago. 3. Dense passive atelectasis in the lower lobes (favored over pneumonia) with worsening aeration.   Electronically Signed   By: Evangeline Dakin M.D.   On: 06/09/2015 15:46   Dg Chest 2 View  06/06/2015   CLINICAL DATA:  Shortness of breath, hypertension.  EXAM: CHEST  2 VIEW  COMPARISON:  05/31/2015  FINDINGS: Cardiomegaly. Bilateral perihilar and lower lobe airspace opacities could reflect atelectasis, edema or a combination. Small bilateral pleural effusions suspected.  Advanced  degenerative changes in the shoulders and thoracic spine. No acute bony abnormality.  IMPRESSION: Cardiomegaly with bilateral perihilar and lower lobe opacities. This could reflect edema, atelectasis or a combination.  Small layering effusions.   Electronically Signed   By: Rolm Baptise M.D.   On: 06/06/2015 16:26   Dg Chest 2 View  05/31/2015   CLINICAL DATA:  Progressive, predominantly lower extremity weakness over the past year, worse in the last week. Fevers and decreased appetite.  EXAM: CHEST  2 VIEW  COMPARISON:  None.  FINDINGS: The cardiac silhouette is upper limits of normal in size. Thoracic aortic calcification is noted. There is slight coarsening of the interstitial markings bilaterally. No confluent airspace consolidation, edema, pleural effusion, or pneumothorax is identified. Curvilinear lucency projecting over the lateral left lung base likely represents a skin fold. Thoracic spondylosis is noted.  IMPRESSION: No active cardiopulmonary disease.   Electronically Signed   By: Zenia Resides  Jeralyn Ruths M.D.   On: 05/31/2015 10:38   Dg Shoulder 1v Left  05/31/2015   CLINICAL DATA:  Left shoulder pain, remote trauma  EXAM: LEFT SHOULDER - 1 VIEW  COMPARISON:  None.  FINDINGS: Severe left glenohumeral joint degenerative change. High-riding left humeral head may indicate rotator cuff tendinopathy. AC joint degenerative change.  IMPRESSION: Degenerative change as above.   Electronically Signed   By: Conchita Paris M.D.   On: 05/31/2015 21:04   Dg Wrist Complete Left  06/01/2015   CLINICAL DATA:  Ulnar-sided wrist pain extending across the posterior hand in into the index finger.  EXAM: LEFT WRIST - COMPLETE 3+ VIEW  COMPARISON:  None.  FINDINGS: No acute fracture or dislocation is identified. There is moderate to severe joint space narrowing at the first [facility] joint with subchondral sclerosis and mild osteophytosis. No lytic or blastic osseous lesion or osseous erosion is identified. Vascular calcifications are  noted.  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   Electronically Signed   By: Logan Bores M.D.   On: 06/01/2015 14:54   Ct Head Wo Contrast  06/06/2015   CLINICAL DATA:  Confusion.  Left facial droop.  EXAM: CT HEAD WITHOUT CONTRAST  TECHNIQUE: Contiguous axial images were obtained from the base of the skull through the vertex without intravenous contrast.  COMPARISON:  06/01/2015  FINDINGS: There is mild cerebral and cerebellar atrophy, unchanged. Minimal periventricular white matter hypoattenuation is unchanged and nonspecific but compatible with chronic small vessel ischemia and not greater than expected for patient's age.  Orbits are unremarkable. Small right maxillary sinus mucous retention cyst and trace left inferior mastoid air cell opacification are noted. No skull fracture. Calcified atherosclerosis at the skullbase.  IMPRESSION: No evidence of acute intracranial abnormality.   Electronically Signed   By: Logan Bores M.D.   On: 06/06/2015 15:44   Ct Head Wo Contrast  06/01/2015   CLINICAL DATA:  Prior headache, now resolved. Altered mental status. History of hypertension.  EXAM: CT HEAD WITHOUT CONTRAST  TECHNIQUE: Contiguous axial images were obtained from the base of the skull through the vertex without intravenous contrast.  COMPARISON:  None.  FINDINGS: The ventricles and sulci are normal for age. No intraparenchymal hemorrhage, mass effect nor midline shift. Patchy supratentorial white matter hypodensities are less than expected for patient's age and though non-specific suggest sequelae of chronic small vessel ischemic disease. No acute large vascular territory infarcts.  No abnormal extra-axial fluid collections. Basal cisterns are patent. Moderate calcific atherosclerosis of the carotid siphons.  No skull fracture. The included ocular globes and orbital contents are non-suspicious. Small paranasal sinus mucosal retention cysts without paranasal sinus air-fluid levels.  The mastoid air cells are well aerated.  IMPRESSION: Negative noncontrast CT head for age.   Electronically Signed   By: Elon Alas M.D.   On: 06/01/2015 00:39   US Renal  06/04/2015   CLINICAL DATA:  Acute renal failure  EXAM: RENAL / URINARY TRACT ULTRASOUND COMPLETE  COMPARISON:  05/31/2015 abdominal ultrasound  FINDINGS: Right Kidney:  Length: 10.2 cm. Echogenicity at upper limits of normal without hydronephrosis or focal mass.  Left Kidney:  Length: 10.5 cm. Echogenicity at upper limits of normal without hydronephrosis or focal mass.  Bladder:  Appears normal for degree of bladder distention.  Splenomegaly and echogenic splenic lesions most likely hemangiomas reidentified. This is not fully evaluated at this targeted exam today.  IMPRESSION: Borderline increased renal cortical echogenicity compatible with medical renal disease.  Electronically Signed   By: Conchita Paris M.D.   On: 06/04/2015 13:11   US Abdomen Port  06/01/2015   CLINICAL DATA:  Abdominal pain.  EXAM: ULTRASOUND PORTABLE ABDOMEN  COMPARISON:  None.  FINDINGS: Gallbladder: Limited visualization due to patient positioning. No gallstones or wall thickening visualized. No sonographic Murphy sign noted.  Common bile duct: Diameter: 3.5 mm, normal  Liver: No focal lesion identified. Within normal limits in parenchymal echogenicity.  IVC: No abnormality visualized.  Pancreas: Not visualized due to overlying bowel gas.  Spleen: Spleen length measures 13.4 cm with volume 328 mL. Circumscribed focal hyperechoic lesions are demonstrated in the spleen, largest measuring 2 cm maximal diameter and smaller measuring 1.7 cm maximal diameter. These are likely to represent hemangiomas.  Right Kidney: Length: 10.5 cm. Echogenicity within normal limits. No mass or hydronephrosis visualized.  Left Kidney: Length: 10.9 cm. Echogenicity within normal limits. No mass or hydronephrosis visualized.  Abdominal aorta: No aneurysm visualized.  Other findings:  Small bilateral pleural effusions noted.  IMPRESSION: Splenic enlargement. Focal hyperechoic lesions in the spleen likely representing hemangiomas. Small bilateral pleural effusions are noted.   Electronically Signed   By: Lucienne Capers M.D.   On: 06/01/2015 00:51   Dg Abd Decub  06/08/2015   CLINICAL DATA:  Evaluate for free intraperitoneal air. Abdominal distension.  EXAM: ABDOMEN - 1 VIEW DECUBITUS  COMPARISON:  Chest radiograph 06/06/2015  FINDINGS: Single decubitus view demonstrates no radiographic evidence free intraperitoneal air. Scattered dilated loops of bowel are demonstrated through the central abdomen. Extensive lower thoracic lumbar spine degenerative changes. Heterogeneous opacities within the lung bases.  IMPRESSION: No evidence for free intraperitoneal air.   Electronically Signed   By: Lovey Newcomer M.D.   On: 06/08/2015 12:43   Dg Humerus Left  05/31/2015   CLINICAL DATA:  Chronic left humeral pain, remote injury  EXAM: LEFT HUMERUS - 2+ VIEW  COMPARISON:  None.  FINDINGS: There is no evidence of fracture or other focal bone lesions. Soft tissues are unremarkable. Severe left glenohumeral joint degenerative change.  IMPRESSION: No acute abnormality.   Electronically Signed   By: Conchita Paris M.D.   On: 05/31/2015 20:49    Microbiology: Recent Results (from the past 240 hour(s))  Culture, blood (routine x 2)     Status: None   Collection Time: 06/06/15 11:20 AM  Result Value Ref Range Status   Specimen Description BLOOD RIGHT ARM  Final   Special Requests BOTTLES DRAWN AEROBIC AND ANAEROBIC 8CC  Final   Culture   Final    NO GROWTH 5 DAYS Performed at [facility]    Report Status 06/11/2015 FINAL  Final  Culture, blood (routine x 2)     Status: None   Collection Time: 06/06/15 11:28 AM  Result Value Ref Range Status   Specimen Description BLOOD LEFT ARM  Final   Special Requests BOTTLES DRAWN AEROBIC AND ANAEROBIC 10CC  Final   Culture   Final    NO GROWTH 5  DAYS Performed at [facility]    Report Status 06/11/2015 FINAL  Final     Labs: Basic Metabolic Panel:  Recent Labs Lab 06/06/15 1120  06/09/15 0504 06/10/15 0604 06/11/15 0515 06/12/15 0504 06/13/15 0500  NA  --   < > 129* 133* 136 134* 138  K  --   < > 4.4 4.3 3.5 3.2* 3.8  CL  --   < > 97* 97* 100* 97* 100*  CO2  --   < >  _0 GLUCOSE  --   < > 97 101* 100* 103* 103*  BUN  --   < > 56* 66* 73* 62* 55*  CREATININE  --   < > 2.63* 2.76* 2.37* 2.03* 1.58*  CALCIUM  --   < > 7.7* 8.1* 7.9* 7.8* 7.9*  MG 2.1  --   --   --   --   --   --   PHOS  --   --   --  4.5  --   --   --   < > = values in this interval not displayed. Liver Function Tests:  Recent Labs Lab 06/10/15 0604  ALBUMIN 2.2*   No results for input(s): LIPASE, AMYLASE in the last 168 hours.  Recent Labs Lab 06/06/15 1120  AMMONIA 17   CBC:  Recent Labs Lab 06/08/15 0811 06/09/15 0504 06/10/15 0614 06/11/15 0515 06/12/15 0504 06/13/15 0500  WBC 36.9* 25.9* 27.7* 18.4* 17.0* 18.3*  NEUTROABS 35.5*  --   --   --   --   --   HGB 8.0* 7.7* 10.1* 9.0* 9.1* 8.9*  HCT 25.4* 24.2* 31.6* 28.0* 28.9* 29.4*  MCV 92.4 91.3 89.5 89.2 90.6 91.6  PLT 486* 456* 553* 446* 444* 435*   Cardiac Enzymes: No results for input(s): CKTOTAL, CKMB, CKMBINDEX, TROPONINI in the last 168 hours. BNP: BNP (last 3 results)  Recent Labs  06/06/15 0512  BNP 425.4*    ProBNP (last 3 results) No results for input(s): PROBNP in the last 8760 hours.  CBG:  Recent Labs Lab 06/06/15 1157  GLUCAP 130*       Signed:  Regalado, Belkys A  [facility] 06/13/2015, 9:07 AM

## 2015-06-13 NOTE — Progress Notes (Signed)
Pt for discharge to [facility].   CSW facilitated pt discharge needs including contacting facility, faxing pt discharge information via [facility], discussing with pt and pt daughter-in-law at bedside, providing RN phone number to call report, and arranging ambulance transport for pt to [facility] scheduled for 1:30 pm pick up.  Pt eager to get to [facility] to begin his rehab.  No further social work needs identified at this time.  CSW signing off.   Alison Murray, MSW, [place] [facility] [phone]

## 2015-06-13 NOTE — Care Management Important Message (Signed)
Important Message  Patient Details  Name: Jack Schultz MRN: [id number] Date of Birth: 07/02/1924   Medicare Important Message Given:  Yes-fourth notification given    Camillo Flaming 06/13/2015, 10:57 AMImportant Message  Patient Details  Name: Jack Schultz MRN: [id number] Date of Birth: 1923/10/18   Medicare Important Message Given:  Yes-fourth notification given    Camillo Flaming 06/13/2015, 10:57 AM

## 2015-06-20 ENCOUNTER — Telehealth: Payer: Self-pay | Admitting: Oncology

## 2015-06-20 ENCOUNTER — Other Ambulatory Visit (HOSPITAL_BASED_OUTPATIENT_CLINIC_OR_DEPARTMENT_OTHER): Payer: Medicare Other

## 2015-06-20 ENCOUNTER — Ambulatory Visit (HOSPITAL_BASED_OUTPATIENT_CLINIC_OR_DEPARTMENT_OTHER): Payer: Medicare Other | Admitting: Nurse Practitioner

## 2015-06-20 ENCOUNTER — Other Ambulatory Visit: Payer: Self-pay | Admitting: *Deleted

## 2015-06-20 VITALS — BP 112/58 | HR 119 | Temp 97.7°F | Resp 17 | Ht 69.0 in

## 2015-06-20 DIAGNOSIS — D473 Essential (hemorrhagic) thrombocythemia: Secondary | ICD-10-CM

## 2015-06-20 LAB — CBC WITH DIFFERENTIAL/PLATELET
BASO%: 0.2 % (ref 0.0–2.0)
Basophils Absolute: 0 10*3/uL (ref 0.0–0.1)
EOS%: 0.8 % (ref 0.0–7.0)
Eosinophils Absolute: 0.1 10*3/uL (ref 0.0–0.5)
HEMATOCRIT: 26.8 % — AB (ref 38.4–49.9)
HGB: 8.3 g/dL — ABNORMAL LOW (ref 13.0–17.1)
LYMPH%: 6 % — AB (ref 14.0–49.0)
MCH: 28.2 pg (ref 27.2–33.4)
MCHC: 31 g/dL — AB (ref 32.0–36.0)
MCV: 91.2 fL (ref 79.3–98.0)
MONO#: 0.5 10*3/uL (ref 0.1–0.9)
MONO%: 3.7 % (ref 0.0–14.0)
NEUT#: 10.8 10*3/uL — ABNORMAL HIGH (ref 1.5–6.5)
NEUT%: 89.3 % — AB (ref 39.0–75.0)
PLATELETS: 361 10*3/uL (ref 140–400)
RBC: 2.94 10*6/uL — AB (ref 4.20–5.82)
RDW: 21.4 % — ABNORMAL HIGH (ref 11.0–14.6)
WBC: 12.1 10*3/uL — ABNORMAL HIGH (ref 4.0–10.3)
lymph#: 0.7 10*3/uL — ABNORMAL LOW (ref 0.9–3.3)
nRBC: 1 % — ABNORMAL HIGH (ref 0–0)

## 2015-06-20 LAB — TECHNOLOGIST REVIEW

## 2015-06-20 NOTE — Progress Notes (Addendum)
[facility] OFFICE PROGRESS NOTE   Diagnosis:  Essential thrombocytosis  INTERVAL HISTORY:   Jack Schultz returns for follow-up. He was hospitalized 05/31/2015 with weakness and recurrent falls after treatment with Keflex for a left arm cellulitis. He was found to have severe anemia. He required transfusion support. He developed acute renal failure during the hospitalization.  Hydroxyurea was resumed on 06/11/2015. He was discharged 06/13/2015 to a nursing facility.   He is participating in physical therapy. He remains weak. He is having significant problems with balance. Other than a small amount of blood with nose blowing he has had no bleeding. He has shortness of breath on exertion. No fever. He denies pain.   Objective:  Vital signs in last 24 hours:  Blood pressure 112/58, pulse 119, temperature 97.7 F (36.5 C), temperature source Oral, resp. rate 17, height 5\' 9"  (1.753 m), SpO2 97 %.    HEENT: No thrush or ulcers. Lymphatics: No palpable cervical or supraclavicular lymph nodes. Resp: Lungs clear bilaterally. Cardio: Regular with premature beats. GI: Soft and nontender. No organomegaly. Vascular: Pitting edema at the lower legs bilaterally. Neuro: Alert and oriented. Gait disturbance. Markedly unsteady. Drifts backward.   Lab Results:  Lab Results  Component Value Date   WBC 12.1* 06/20/2015   HGB 8.3* 06/20/2015   HCT 26.8* 06/20/2015   MCV 91.2 06/20/2015   PLT 361 06/20/2015   NEUTROABS 10.8* 06/20/2015    Imaging:  No results found.  Medications: I have reviewed the patient's current medications.  Assessment/Plan: 1. Essential thrombocytosis-maintained on hydroxyurea for years prior to hospital admission. Currently taking hydroxyurea 500 mg daily.  2. Leukocytosis-progressive during this hospital admission, likely secondary to being maintained off of hydroxyurea, improved since Hydrea was resumed.  3. Pericarditis  4. Sepsis  syndrome, probable pneumonia-antibiotics as recommended by infectious disease  5. Anemia secondary to the underlying myeloproliferative disorder, hydroxyurea, infection, and potentially chronic renal failure, status post red cell transfusions 06/01/2015 and 06/09/2015  6. Acute/chronic renal failure  7. History of recurrent epistaxis secondary to platelet dysfunction     Disposition: The platelet count is in normal range. He will continue hydroxyurea at the current dose.   He has persistent anemia. This is likely multifactorial related to the myeloproliferative disorder, hydroxyurea, recent illness. He also has chronic renal failure. He will return for a CBC in 2 weeks.  At today's visit he was noted to have a marked gait disturbance. Brain CT during the recent hospitalization was negative. We recommended a neurology referral for further evaluation.  He will return for labs in 2 weeks and an office visit in 4 weeks. He will contact the office in the interim with any problems.  Patient seen with Dr. Benay Spice. 25 minutes were spent face-to-face at today's visit with the majority of that time involved in counseling/coordination of care.    [name] ANP/GNP-BC   06/20/2015  2:46 PM  This was a shared visit with [name]. Jack Schultz was interviewed and examined. He has persistent anemia secondary to the underlying myeloproliferative disorder, hydroxyurea, and the recent illness. I have a low clinical suspicion for progression to leukemia. He could be entering the fibrotic phase of the myeloproliferative disorder. We will review the peripheral blood smear. He will return for a CBC in 2 weeks and an office visit in 4 weeks.  He has significant ataxia, falling backwards when ambulating with assistance. We recommend a neurology evaluation.  Julieanne Manson, M.D.

## 2015-06-20 NOTE — Telephone Encounter (Signed)
Gave adn printed appt sched and avs for pt for OCT °

## 2015-06-22 NOTE — Progress Notes (Signed)
Cardiology Office Note   Date:  06/23/2015   ID:  Jack Schultz, DOB Mar 12, 1924, MRN [id number]  PCP:  [name], MD  Cardiologist:  Dr. Kirk Ruths   Electrophysiologist:  n/a  Chief Complaint  Patient presents with  . Hospitalization Follow-up  . Congestive Heart Failure  . Atrial Fibrillation  . Other    complete heart block     History of Present Illness: Jack Schultz is a 81 y.o. male with a hx of HTN, HL, thrombocytosis, myeloproliferative disorder with recurrent epistaxis on aspirin therapy, hypothyroidism, iron deficiency anemia, GERD.  Admitted 9/7-9/20 with sepsis and healthcare associated pneumonia. Hospitalization was complex and complicated by acute kidney injury, complete heart block, volume excess (diastolic HF), worsening anemia requiring transfusion with PRBCs, minimally elevated troponins and possible myopericarditis. He was seen by cardiology. Initial ECG demonstrated sinus rhythm with complete heart block and narrow ventricular escape. Echocardiogram demonstrated normal LV function, mild to moderately elevated pulmonary pressures and trivial effusion. Antibiotics were discontinued. He transitioned into atrial flutter/fibrillation with controlled rate. Echocardiogram did not suggest vegetation. Blood cultures remained neg.  He was seen by ID. TEE was not recommended. Repeat echo was stable. There was initially no plan for pacemaker given his ongoing infection. There was no plan for cardioversion or antiarrhythmics as atrial flutter was asymptomatic and there was no bradycardia or tachycardia. Pericarditis was felt to possibly explain conduction abnormalities and atrial flutter. He was not placed on colchicine or NSAIDs given worsening creatinine. He was not placed on high-dose aspirin given worsening anemia. He was not felt to be an optimal candidate for anticoagulation. Of note, he had prolonged QT on Haldol and this was discontinued. He required  diuresis with Lasix.  He is now at [facility].  He is working with PT.  He is weak and notes DOE.  He is overall NYHA 3.  Denies orthopnea, PND.  LE edema is stable.  Weight is sporadically checked at SNF.  He is not able to stand for very long.  Weights apparently stable.  No chest pain.  No syncope.  He has a mild non-productive cough. No wheezing.  Denies any bleeding.    Studies/Reports Reviewed Today:  Echo 06/08/15 The uterus LVF, EF 65-70%, normal wall motion, aortic sclerosis, mild AI, trivial MR, mild LAE (37), mild RAE, mild TR, PASP 37 mmHg, trivial effusion  Echo 06/01/15 EF 55%, mild AI, mild MR, mild LAE, mild RVE, PASP 50 mmHg, trivial effusion   Past Medical History  Diagnosis Date  . Hypertension   . Hyperlipidemia   . Pre-diabetes   . Thyroid disease   . Thrombocytosis   . GERD (gastroesophageal reflux disease)   . Vitamin D deficiency     No past surgical history on file.   Current Outpatient Prescriptions  Medication Sig Dispense Refill  . acetaminophen (TYLENOL) 650 MG suppository Place 1 suppository (650 mg total) rectally every 6 (six) hours as needed for mild pain (or Fever >/= 101). 12 suppository 0  . aspirin 81 MG tablet Take 81 mg by mouth daily.    . Cholecalciferol (VITAMIN D PO) Take 5,000 Units by mouth daily.    . feeding supplement, ENSURE ENLIVE, (ENSURE ENLIVE) LIQD Take 237 mLs by mouth 2 (two) times daily between meals. 237 mL 12  . ferrous sulfate 325 (65 FE) MG tablet Take 325 mg by mouth daily with breakfast.    . furosemide (LASIX) 40 MG tablet Take 40 mg by mouth daily.    Marland Kitchen  hydroxyurea (HYDREA) 500 MG capsule Take 500 mg by mouth daily.    Marland Kitchen levothyroxine (SYNTHROID, LEVOTHROID) 50 MCG tablet Take 50 mcg by mouth daily.    . polyethylene glycol (MIRALAX / GLYCOLAX) packet Take 17 g by mouth daily as needed for mild constipation. 14 each 0  . potassium chloride SA (K-DUR,KLOR-CON) 20 MEQ tablet Take 1 tablet (20 mEq total) by mouth daily.  5 tablet 0  . metoprolol tartrate (LOPRESSOR) 25 MG tablet Take 0.5 tablets (12.5 mg total) by mouth 2 (two) times daily.     No current facility-administered medications for this visit.   Facility-Administered Medications Ordered in Other Visits  Medication Dose Route Frequency Provider Last Rate Last Dose  . iohexol (OMNIPAQUE) 300 MG/ML solution 25 mL  25 mL Oral Once PRN Medication Radiologist, MD        Allergies:   Ace inhibitors    Social History:  The patient  reports that he quit smoking about 39 years ago. He uses smokeless tobacco. He reports that he does not drink alcohol or use illicit drugs.   Family History:  The patient's family history includes Heart disease in his father; Hypertension in his father.    ROS:   Please see the history of present illness.   Review of Systems  All other systems reviewed and are negative.     PHYSICAL EXAM: VS:  BP 116/52 mmHg  Pulse 131  Ht 5' 11.5" (1.816 m)  Wt 167 lb (75.751 kg)  BMI 22.97 kg/m2    Wt Readings from Last 3 Encounters:  06/23/15 167 lb (75.751 kg)  06/13/15 166 lb 14.2 oz (75.7 kg)  05/25/15 173 lb 3.2 oz (78.563 kg)     GEN: Well nourished, well developed, in no acute distress HEENT: normal Neck: no JVD at 90 degrees,  no masses Cardiac:  Normal S1/S2, irreg irreg rhythm; no murmur ,  no rubs or gallops, 1+ bilateral LE edema   Respiratory:  clear to auscultation bilaterally, no wheezing, rhonchi or rales. GI: soft, nontender, nondistended, + BS MS: no deformity or atrophy Skin: warm and dry  Neuro:  CNs II-XII intact, Strength and sensation are intact Psych: Normal affect   EKG:  EKG is ordered today.  It demonstrates:   AFib, HR 131   Recent Labs: 05/31/2015: ALT 24; TSH 0.841 06/06/2015: B Natriuretic Peptide 425.4*; Magnesium 2.1 06/13/2015: BUN 55*; Creatinine, Ser 1.58*; Potassium 3.8; Sodium 138 06/20/2015: HGB 8.3*; Platelets 361    Lipid Panel    Component Value Date/Time   CHOL 125  01/10/2015 1702   TRIG 138 01/10/2015 1702   HDL 23* 01/10/2015 1702   CHOLHDL 5.4 01/10/2015 1702   VLDL 28 01/10/2015 1702   LDLCALC 74 01/10/2015 1702      ASSESSMENT AND PLAN:  1. Complete heart block - transient:  Recent admit for sepsis with complex hospitalization.  He was nitially in what appeared to be CHB although HR was 60-70s on initial EKG.  His atenolol was stopped.  He then went into atrial Fib/Flutter with fairly well controlled HRs.  TSH normal. Echo with normal LVF.  PPM was not indicated in the hospital. He is currently in AFib with RVR.  I will start rate control Rx as noted below.  Hopefully , he will not have recurrent CHB.     2. Pericarditis: No CP. EKG in the hospital showed diffusely elevated J point ST segments suggestive of pericarditis. With increased creatinine, NSAIDS and Colchicine is avoided.  There is no evidence of ST elevation on ECG today. Repeat echo in the hospital with only trivial posterior effusion.  3. Chronic diastolic CHF:  Volume overall appears stable.  Lungs are clear with good air movement.  He still has some LE edema.  Hopefully with continued Lasix and rate control, this will improve.  Will request BMET to be drawn at his facility.     4. Atrial Fibrillation: He presents for FU today in AF with RVR.  He is short of breath with PT and ADLs.  Suspect uncontrolled rate is contributing.  Will resume beta-blocker therapy, gently.  Given his myeloproliferative d/o, I do not think he is a candidate for anticoagulation as he is likely high risk for bleeding.   -  Start Metoprolol Tartrate 12.5 mg bid   -  I will cc my note to his hematologist (Dr. Benay Spice) to get his input on his bleeding risk with anticoagulation.   5. Anemia:    FU with hematology as planned.  6. Generalized weakness with frequent fall:   His high fall risk is another reason to believe he is a poor candidate for anticoagulation.    7. AKI: Creatinine was improved at  [facility].  Will request FU BMET at his SNF.    8. Hypertension: BP stable.   9. Hypothyroidism: TSH normal     Medication Changes: Current medicines are reviewed at length with the patient today.  Concerns regarding medicines are as outlined above.  The following changes have been made:   Discontinued Medications   FUROSEMIDE (LASIX) 40 MG TABLET    TAKE 1 TABLET EVERY DAY   HYDROXYUREA (HYDREA) 500 MG CAPSULE    TAKE ONE CAPSULE BY MOUTH EVERY DAY   LEVOTHYROXINE (SYNTHROID, LEVOTHROID) 50 MCG TABLET    TAKE 1 TABLET BY MOUTH EVERY DAY   Modified Medications   No medications on file   New Prescriptions   METOPROLOL TARTRATE (LOPRESSOR) 25 MG TABLET    Take 0.5 tablets (12.5 mg total) by mouth 2 (two) times daily.   Labs/ tests ordered today include:   Orders Placed This Encounter  Procedures  . EKG 12-Lead      Disposition:    I spent > 45 min reviewing hospital records, evaluating the patient and coordinating care.  Face to face time > 50%. FU with Dr. Kirk Ruths 2 weeks.     Signed, Versie Starks, MHS 06/23/2015 1:30 PM    [facility] [place], [place], [place]  [address] Phone: [phone]; Fax: [phone]

## 2015-06-23 ENCOUNTER — Ambulatory Visit (INDEPENDENT_AMBULATORY_CARE_PROVIDER_SITE_OTHER): Payer: Medicare Other | Admitting: Physician Assistant

## 2015-06-23 ENCOUNTER — Encounter: Payer: Self-pay | Admitting: Physician Assistant

## 2015-06-23 VITALS — BP 116/52 | HR 131 | Ht 71.5 in | Wt 167.0 lb

## 2015-06-23 DIAGNOSIS — I5032 Chronic diastolic (congestive) heart failure: Secondary | ICD-10-CM

## 2015-06-23 DIAGNOSIS — I10 Essential (primary) hypertension: Secondary | ICD-10-CM | POA: Diagnosis not present

## 2015-06-23 DIAGNOSIS — I442 Atrioventricular block, complete: Secondary | ICD-10-CM | POA: Diagnosis not present

## 2015-06-23 DIAGNOSIS — I480 Paroxysmal atrial fibrillation: Secondary | ICD-10-CM

## 2015-06-23 DIAGNOSIS — Z8679 Personal history of other diseases of the circulatory system: Secondary | ICD-10-CM

## 2015-06-23 DIAGNOSIS — R296 Repeated falls: Secondary | ICD-10-CM

## 2015-06-23 DIAGNOSIS — D62 Acute posthemorrhagic anemia: Secondary | ICD-10-CM

## 2015-06-23 DIAGNOSIS — N179 Acute kidney failure, unspecified: Secondary | ICD-10-CM

## 2015-06-23 MED ORDER — METOPROLOL TARTRATE 25 MG PO TABS: 12.5000 mg | ORAL_TABLET | Freq: Two times a day (BID) | ORAL | Status: AC

## 2015-06-23 NOTE — Patient Instructions (Addendum)
Medication Instructions:  1. START METOPROLOL TARTRATE 12.5 MG TWICE DAILY   Labwork: NONE  Testing/Procedures: NONE  Follow-Up: 2 WEEKS WITH DR. CRENSHAW  Any Other Special Instructions Will Be Listed Below (If Applicable).

## 2015-06-26 ENCOUNTER — Telehealth: Payer: Self-pay | Admitting: *Deleted

## 2015-06-26 NOTE — Telephone Encounter (Signed)
Called and spoke to [place] who works for Mrs. Lukach. Advised we needed to reschedule pt appt due to Dr. Jaynee Eagles being out of office today and tomorrow for family emergency. Offered appt with Dr. Leta Baptist on Thursday at 120 or 200pm. Also offered with Dr. Jaynee Eagles next week Monday or Tuesday at 48am or Thursday at 130pm. She is going to check with family and see what time works best and call back to reschedule. Gave her [facility] phone number.

## 2015-06-26 NOTE — Telephone Encounter (Signed)
Pt rescheduled to Dr. Erlinda Hong at 800am.

## 2015-06-27 ENCOUNTER — Encounter: Payer: Self-pay | Admitting: Neurology

## 2015-06-27 ENCOUNTER — Ambulatory Visit (INDEPENDENT_AMBULATORY_CARE_PROVIDER_SITE_OTHER): Payer: Medicare Other | Admitting: Neurology

## 2015-06-27 ENCOUNTER — Ambulatory Visit: Payer: Medicare Other | Admitting: Neurology

## 2015-06-27 VITALS — BP 107/72 | HR 84 | Ht 71.0 in

## 2015-06-27 DIAGNOSIS — D649 Anemia, unspecified: Secondary | ICD-10-CM

## 2015-06-27 DIAGNOSIS — I4891 Unspecified atrial fibrillation: Secondary | ICD-10-CM

## 2015-06-27 DIAGNOSIS — R269 Unspecified abnormalities of gait and mobility: Secondary | ICD-10-CM

## 2015-06-27 DIAGNOSIS — G629 Polyneuropathy, unspecified: Secondary | ICD-10-CM | POA: Diagnosis not present

## 2015-06-27 DIAGNOSIS — I5032 Chronic diastolic (congestive) heart failure: Secondary | ICD-10-CM | POA: Insufficient documentation

## 2015-06-27 NOTE — Progress Notes (Signed)
NEUROLOGY CLINIC NEW PATIENT NOTE  NAME: Jack Schultz DOB: 1924/03/01 REFERRING PHYSICIAN: Unk Pinto, MD  I saw Jack Schultz as a new consult in the neurovascular clinic today regarding  Chief Complaint  Patient presents with  . Follow-up    nursing rehab facilitu   Patient was accompanied by daughter and son.  HPI: Jack Schultz is an 81 y.o. Male with a past medical history that is pertinent for HTN, hyperlipidemia, pre-diabetes, essential thrombocytosis on hydroxyurea, anemia due to myeloproliferative disorder, recurrent epistaxis, CKD, chronic illness and aspirin therapy, as well as hypothyroidism and GERD.  As per family, he was in good health taking care of his wife at home until 05/04/2015 when he developed fever and generalized weakness with a fall. He had to be carried to bed. However, the second day fever resolved and he was back to his baseline.  On 05/25/2015, he was found to have a fever again with left upper extremity cellulitis, generalized weakness with a fall again. He was sent to PCP and started on antibiotics.   He was then admitted 9/7-9/20 with sepsis and healthcare associated pneumonia. Hospitalization was complex and complicated by acute kidney injury, complete heart block, diastolic CHF, worsening anemia requiring transfusion with PRBCs, elevated troponins and possible myopericarditis. He was seen by cardiology. Initial ECG demonstrated sinus rhythm with complete heart block and narrow ventricular escape. Echocardiogram demonstrated normal LV function, mild to moderately elevated pulmonary pressures and trivial effusion. Antibiotics were discontinued. He transitioned into atrial flutter/fibrillation with controlled rate. Echocardiogram did not suggest vegetation. Blood cultures remained neg. He was seen by ID. TEE was not recommended. Repeat echo was stable. There was initially no plan for pacemaker given his ongoing infection. There was no plan  for cardioversion or antiarrhythmics as atrial flutter was asymptomatic and there was no bradycardia or tachycardia. Pericarditis was felt to possibly explain conduction abnormalities and atrial flutter. He was not placed on colchicine or NSAIDs given worsening creatinine. He was not placed on high-dose aspirin given worsening anemia. He was not felt to be a candidate for anticoagulation. Of note, he had prolonged QT on Haldol and this was discontinued. He required diuresis with Lasix.  06/06/15 he had neurology consult for confusion, agitation and subtle left facial droop, EEG negative, MRI not able to be done due to leg cramping and restless. Pt back to baseline on 06/07/15 and his confusion was felt to be related to his ongoing infection at that time. CT head on 05/31/2015 with no acute findings.  Patient was discharged to SNF, and continue to follow-up with cardiology on 06/23/2015, found to have A. fib with RVR, started metoprolol for A. fib rate control. He also follow up with oncology on 06/20/2015, found to have continued dropping of hemoglobin and also significant gait disability, neurology evaluation requested. As per family, patient has not able to walk well since her last admission.  Today in clinic, patient came in in a wheelchair, when asked to walk inside the room, with only minimal exertion, he became extremely short of breath, had to sit down.   Past Medical History  Diagnosis Date  . Hypertension   . Hyperlipidemia   . Pre-diabetes   . Thyroid disease   . Thrombocytosis ([place])   . GERD (gastroesophageal reflux disease)   . Vitamin D deficiency   . Vision abnormalities   . Neuropathy ([place])   . Syncope and collapse    No past surgical history on file. Family History  Problem Relation Age of Onset  . Hypertension Father   . Heart disease Father    Current Outpatient Prescriptions  Medication Sig Dispense Refill  . acetaminophen (TYLENOL) 650 MG suppository Place 1  suppository (650 mg total) rectally every 6 (six) hours as needed for mild pain (or Fever >/= 101). 12 suppository 0  . Cholecalciferol (VITAMIN D PO) Take 5,000 Units by mouth daily.    . feeding supplement, ENSURE ENLIVE, (ENSURE ENLIVE) LIQD Take 237 mLs by mouth 2 (two) times daily between meals. 237 mL 12  . ferrous sulfate 325 (65 FE) MG tablet Take 325 mg by mouth daily with breakfast.    . furosemide (LASIX) 40 MG tablet Take 40 mg by mouth daily.    . hydroxyurea (HYDREA) 500 MG capsule Take 500 mg by mouth daily.    Marland Kitchen levothyroxine (SYNTHROID, LEVOTHROID) 50 MCG tablet Take 50 mcg by mouth daily.    . metoprolol tartrate (LOPRESSOR) 25 MG tablet Take 0.5 tablets (12.5 mg total) by mouth 2 (two) times daily.    . polyethylene glycol (MIRALAX / GLYCOLAX) packet Take 17 g by mouth daily as needed for mild constipation. 14 each 0  . potassium chloride SA (K-DUR,KLOR-CON) 20 MEQ tablet Take 1 tablet (20 mEq total) by mouth daily. 5 tablet 0  . aspirin 81 MG tablet Take 81 mg by mouth daily.     No current facility-administered medications for this visit.   Facility-Administered Medications Ordered in Other Visits  Medication Dose Route Frequency Provider Last Rate Last Dose  . iohexol (OMNIPAQUE) 300 MG/ML solution 25 mL  25 mL Oral Once PRN Medication Radiologist, MD       Allergies  Allergen Reactions  . Ace Inhibitors Cough   Social History   Social History  . Marital Status: Married    Spouse Name: N/A  . Number of Children: N/A  . Years of Education: N/A   Occupational History  . Not on file.   Social History Main Topics  . Smoking status: Former Smoker    Quit date: 09/24/1975  . Smokeless tobacco: Current User  . Alcohol Use: No  . Drug Use: No  . Sexual Activity: No   Other Topics Concern  . Not on file   Social History Narrative    Review of Systems Full 14 system review of systems performed and notable only for those listed, all others are neg:    Constitutional:  Weight loss, fatigue Cardiovascular: Palpitations, swelling in legs Ear/Nose/Throat:  Ringing years, trouble swallowing Skin:  Eyes:  Blurry vision Respiratory:  SOB Gastroitestinal:   Genitourinary:  Hematology/Lymphatic:  Anemia, easy bruising, easy bleeding Endocrine:  Musculoskeletal:  Joint pain, cramps Allergy/Immunology:   Neurological:  Numbness, weakness, difficulty swallowing, dizziness Psychiatric: Decreased energy, change in appetite Sleep:   Physical Exam  Filed Vitals:   06/27/15 0816  BP: 107/72  Pulse: 84    General - mildly cachectic, well developed, in mild respiratory distress.  Ophthalmologic - fundi not visualized due to mild distress.  Cardiovascular - irregularly irregular heart rate and rhythm. Obvious cyanosis.  Extremities - bilateral ankle and feet pitting edema.  Mental Status -  Level of arousal and orientation to time, place, and person were intact. Language including expression, naming, repetition, comprehension was assessed and found intact. Attention span and concentration were normal. Recent and remote memory were 2/3 delayed recall. Fund of Knowledge was assessed and was intact.  Cranial Nerves II - XII - II - Visual field  intact OU. III, IV, VI - Extraocular movements intact. V - Facial sensation intact bilaterally. VII - Facial movement intact bilaterally. VIII - Hearing & vestibular intact bilaterally. X - Palate elevates symmetrically. XI - Chin turning & shoulder shrug intact bilaterally. XII - Tongue protrusion intact.  Motor Strength - The patient's strength was 4/5 BUEs and 4-/5 BLEs and pronator drift was absent.  Bulk was normal and fasciculations were absent.   Motor Tone - Muscle tone was assessed at the neck and appendages and was normal.  Reflexes - The patient's reflexes were 1+ in all extremities and he had no pathological reflexes.  Sensory - Light touch, temperature/pinprick were assessed  and were normal. However, vibration and proprioception were decreased and ankle and below.  Coordination - The patient had normal movements in the hands with no ataxia or dysmetria. Difficult to test with BLEs due to SOB on attempts. Tremor was absent.  Gait and Station - able to walk several steps with two-person assist, very slow and small stride, mild ataxic gait, however became extremely short of breath with minimal exertion, had to sit down. No truncal ataxia.   Imaging  I have personally reviewed the radiological images below and agree with the radiology interpretations.  CT head 05/31/2015 - no acute abnormalities.  EEG 06/07/2015 - Impression: this is a normal awake and asleep EEG. Please, be aware that a normal EEG does not exclude the possibility of epilepsy. Clinical correlation is advised.   2-D echo 06/08/15 - - Left ventricle: The cavity size was normal. Wall thickness was normal. Systolic function was vigorous. The estimated ejection fraction was in the range of 65% to 70%. LV apical to mid-septal false tendon. Wall motion was normal; there were no regional wall motion abnormalities. The study is not technically sufficient to allow evaluation of LV diastolic function. - Aortic valve: Sclerosis without stenosis. There was mild regurgitation. - Mitral valve: Structurally normal valve. There was trivial regurgitation. - Left atrium: The atrium was mildly dilated at 37 ml/m2. - Right atrium: The atrium was mildly dilated. - Tricuspid valve: There was mild regurgitation. - Pulmonary arteries: PA peak pressure: 37 mm Hg (S). - Inferior vena cava: The vessel was normal in size. The respirophasic diameter changes were in the normal range (>= 50%), consistent with normal central venous pressure. - Pericardium, extracardiac: A trivial pericardial effusion was identified posterior to the heart. Features were not consistent with tamponade  physiology. Impressions: - Compared to the prior study on 06/01/2015, there are no significant changes.  Lab Review Component     Latest Ref Rng 01/10/2015 05/31/2015 06/01/2015  Cholesterol     0 - 200 mg/dL 125    Triglycerides     <150 mg/dL 138    HDL Cholesterol     >=40 mg/dL 23 (L)    Total CHOL/HDL Ratio      5.4    VLDL     0 - 40 mg/dL 28    LDL (calc)     0 - 99 mg/dL 74    Hemoglobin A1C     <5.7 % 5.2    Mean Plasma Glucose     <117 mg/dL 103    TSH     0.350 - 4.500 uIU/mL 2.893 0.841   Vitamin B-12     180 - 914 pg/mL   975 (H)      Assessment and Plan:   In summary, Jack Schultz is a 81 y.o. male with PMH of  HTN, hyperlipidemia, pre-diabetes, essential thrombocytosis on hydroxyurea, anemia due to myeloproliferative disorder, recurrent epistaxis, CKD, chronic illness and aspirin therapy, as well as hypothyroidism and GERD started to have fever, generalized weakness with fall, UE cellulitis and was admitted to hospital in September 2016. Hospitalization was complicated by AKI, complete heart block, diastolic CHF, worsening anemia requiring transfusion. Also found to have A. Fib, but not a candidate for anticoagulation due to anemia. Had a neuro consult due to confusion, agitation with subtle left-sided facial droop. EEG negative, MRI not able to be done due to agitation. Symptoms resolved. Discharge to SNF, outpatient follow-up with hematology and oncology showed continued downtrending hemoglobin and significant gait disability and ataxia. Neurology consult requested.  On today's exam, patient continued to have A. fib, pitting edema in LEs, cyanosis, mild SOB at rest. Neuro exam showed bilateral lower extremity decreased vibration and proprioception, consistent with LE peripheral neuropathy. On minimal exertion with walking, patient developed significant SOB, requiring sit down right away. He did have significant gait disability which most consistent with significant  generalized weakness due to medical condition including A. fib, CHF, anemia on top of LE peripheral neuropathy.   No suspicious for any CNS involvement, no indication for MRI or intracranial vessel imaging. Recommend EMG/NCS for evaluation of peripheral neuropathy after medical condition was stabilized. Continue follow-up with cardiology and oncology for medical treatment especially A. fib, CHF, and anemia. Continue PT/OT. Follow up in 3 months is  Thank you very much for the opportunity to participate in the care of this patient.  Please do not hesitate to call if any questions or concerns arise.  A total of 60 minutes was spent face-to-face with this patient. Over half this time was spent on counseling patient on the diagnosis and different diagnostic and therapeutic options available.    No orders of the defined types were placed in this encounter.    No orders of the defined types were placed in this encounter.    Patient Instructions  - Gait disability most consistent with generalized weakness due to medical conditions on top of peripheral neuropathy - No indication for MRI or CNS vascular imaging - EMG/NCS to evaluate her for neuropathy up to her medical condition stabilize - Continue follow up with cardiology and oncology for A. fib, CHF, anemia treatment - Continue PT OT - Follow up in 3 months    Rosalin Hawking, MD PhD [facility] [address] [place], [place] [address] [phone]

## 2015-06-27 NOTE — Patient Instructions (Signed)
-   Gait disability most consistent with generalized weakness due to medical conditions on top of peripheral neuropathy - No indication for MRI or CNS vascular imaging - EMG/NCS to evaluate her for neuropathy up to her medical condition stabilize - Continue follow up with cardiology and oncology for A. fib, CHF, anemia treatment - Continue PT OT - Follow up in 3 months

## 2015-06-29 ENCOUNTER — Ambulatory Visit: Payer: Medicare Other | Admitting: Diagnostic Neuroimaging

## 2015-07-04 ENCOUNTER — Ambulatory Visit (HOSPITAL_COMMUNITY)
Admission: RE | Admit: 2015-07-04 | Discharge: 2015-07-04 | Disposition: A | Discharge status: Home / Self Care | Payer: No Typology Code available for payment source | Source: Ambulatory Visit | Attending: Nurse Practitioner | Admitting: Nurse Practitioner

## 2015-07-04 ENCOUNTER — Other Ambulatory Visit: Payer: Self-pay | Admitting: Nurse Practitioner

## 2015-07-04 ENCOUNTER — Telehealth: Payer: Self-pay | Admitting: Nurse Practitioner

## 2015-07-04 ENCOUNTER — Other Ambulatory Visit (HOSPITAL_BASED_OUTPATIENT_CLINIC_OR_DEPARTMENT_OTHER): Payer: Medicare Other

## 2015-07-04 DIAGNOSIS — D649 Anemia, unspecified: Secondary | ICD-10-CM

## 2015-07-04 DIAGNOSIS — D473 Essential (hemorrhagic) thrombocythemia: Secondary | ICD-10-CM

## 2015-07-04 LAB — CBC WITH DIFFERENTIAL/PLATELET
BASO%: 0.1 % (ref 0.0–2.0)
Basophils Absolute: 0 10*3/uL (ref 0.0–0.1)
EOS ABS: 0.1 10*3/uL (ref 0.0–0.5)
EOS%: 0.7 % (ref 0.0–7.0)
HEMATOCRIT: 22.5 % — AB (ref 38.4–49.9)
HGB: 7 g/dL — ABNORMAL LOW (ref 13.0–17.1)
LYMPH#: 0.4 10*3/uL — AB (ref 0.9–3.3)
LYMPH%: 2.3 % — AB (ref 14.0–49.0)
MCH: 27.8 pg (ref 27.2–33.4)
MCHC: 30.9 g/dL — AB (ref 32.0–36.0)
MCV: 89.8 fL (ref 79.3–98.0)
MONO#: 0.1 10*3/uL (ref 0.1–0.9)
MONO%: 0.5 % (ref 0.0–14.0)
NEUT%: 96.4 % — AB (ref 39.0–75.0)
NEUTROS ABS: 15.5 10*3/uL — AB (ref 1.5–6.5)
PLATELETS: 404 10*3/uL — AB (ref 140–400)
RBC: 2.51 10*6/uL — AB (ref 4.20–5.82)
RDW: 22.2 % — ABNORMAL HIGH (ref 11.0–14.6)
WBC: 16 10*3/uL — ABNORMAL HIGH (ref 4.0–10.3)

## 2015-07-04 LAB — PREPARE RBC (CROSSMATCH)

## 2015-07-04 LAB — CHCC SMEAR

## 2015-07-04 LAB — TECHNOLOGIST REVIEW

## 2015-07-04 LAB — HOLD TUBE, BLOOD BANK

## 2015-07-04 NOTE — Telephone Encounter (Signed)
Call from [facility] in the Lab inquiring about Mr. Wynia's hold on blood HGB is 7. As per Lattie Haw pt. Is to have two units of blood. Informed Mr. Ericksen's daughter, Mr. Schonberg is scheduled to get blood on Wednesday and Friday. Pt.'s daughter verbalized understanding. Owens Shark informed.

## 2015-07-04 NOTE — Telephone Encounter (Signed)
[facility] called 07/04/15

## 2015-07-05 ENCOUNTER — Ambulatory Visit (HOSPITAL_BASED_OUTPATIENT_CLINIC_OR_DEPARTMENT_OTHER): Payer: Medicare Other

## 2015-07-05 VITALS — BP 92/55 | HR 117 | Temp 97.8°F | Resp 18

## 2015-07-05 DIAGNOSIS — D649 Anemia, unspecified: Secondary | ICD-10-CM

## 2015-07-05 MED ORDER — SODIUM CHLORIDE 0.9 % IV SOLN
250.0000 mL | Freq: Once | INTRAVENOUS | Status: AC
  Administered 2015-07-05: 250 mL via INTRAVENOUS

## 2015-07-05 NOTE — Patient Instructions (Signed)

## 2015-07-07 ENCOUNTER — Encounter: Payer: Self-pay | Admitting: *Deleted

## 2015-07-07 ENCOUNTER — Other Ambulatory Visit (HOSPITAL_BASED_OUTPATIENT_CLINIC_OR_DEPARTMENT_OTHER): Payer: Medicare Other

## 2015-07-07 ENCOUNTER — Ambulatory Visit (HOSPITAL_BASED_OUTPATIENT_CLINIC_OR_DEPARTMENT_OTHER): Payer: Medicare Other

## 2015-07-07 ENCOUNTER — Other Ambulatory Visit: Payer: Self-pay | Admitting: *Deleted

## 2015-07-07 VITALS — BP 126/50 | HR 106 | Temp 97.9°F | Resp 18

## 2015-07-07 DIAGNOSIS — D649 Anemia, unspecified: Secondary | ICD-10-CM

## 2015-07-07 DIAGNOSIS — D6489 Other specified anemias: Secondary | ICD-10-CM

## 2015-07-07 LAB — CBC WITH DIFFERENTIAL/PLATELET
BASO%: 0.7 % (ref 0.0–2.0)
Basophils Absolute: 0.1 10*3/uL (ref 0.0–0.1)
EOS ABS: 0.2 10*3/uL (ref 0.0–0.5)
EOS%: 1.6 % (ref 0.0–7.0)
HEMATOCRIT: 30.9 % — AB (ref 38.4–49.9)
HGB: 9.8 g/dL — ABNORMAL LOW (ref 13.0–17.1)
LYMPH#: 0.3 10*3/uL — AB (ref 0.9–3.3)
LYMPH%: 1.8 % — AB (ref 14.0–49.0)
MCH: 28.5 pg (ref 27.2–33.4)
MCHC: 31.6 g/dL — AB (ref 32.0–36.0)
MCV: 90.2 fL (ref 79.3–98.0)
MONO#: 0 10*3/uL — AB (ref 0.1–0.9)
MONO%: 0.3 % (ref 0.0–14.0)
NEUT#: 13.1 10*3/uL — ABNORMAL HIGH (ref 1.5–6.5)
NEUT%: 95.6 % — AB (ref 39.0–75.0)
PLATELETS: 395 10*3/uL (ref 140–400)
RBC: 3.42 10*6/uL — AB (ref 4.20–5.82)
RDW: 21.6 % — ABNORMAL HIGH (ref 11.0–14.6)
WBC: 13.7 10*3/uL — ABNORMAL HIGH (ref 4.0–10.3)

## 2015-07-07 LAB — BASIC METABOLIC PANEL (CC13)
ANION GAP: 8 meq/L (ref 3–11)
BUN: 27.1 mg/dL — AB (ref 7.0–26.0)
CHLORIDE: 100 meq/L (ref 98–109)
CO2: 25 mEq/L (ref 22–29)
CREATININE: 1 mg/dL (ref 0.7–1.3)
Calcium: 8.3 mg/dL — ABNORMAL LOW (ref 8.4–10.4)
EGFR: 67 mL/min/{1.73_m2} — ABNORMAL LOW (ref >90)
GLUCOSE: 112 mg/dL (ref 70–140)
Potassium: 4.1 mEq/L (ref 3.5–5.1)
Sodium: 133 mEq/L — ABNORMAL LOW (ref 136–145)

## 2015-07-07 LAB — TECHNOLOGIST REVIEW

## 2015-07-07 MED ORDER — SODIUM CHLORIDE 0.9 % IV SOLN
250.0000 mL | Freq: Once | INTRAVENOUS | Status: AC
  Administered 2015-07-07: 250 mL via INTRAVENOUS

## 2015-07-07 NOTE — Progress Notes (Addendum)
Reviewed with Dr. Benay Spice (hematologist).  He concurs that his bleeding risk is too high for anticoagulation. Richardson Dopp, PA-C   07/07/2015 9:40 AM

## 2015-07-07 NOTE — Patient Instructions (Signed)

## 2015-07-07 NOTE — Progress Notes (Signed)
CBC/BMP results faxed to Dr. Philip Aspen at [facility]

## 2015-07-07 NOTE — Progress Notes (Signed)
Received call from [facility] stating Dr. Philip Aspen w/ [facility] would like CBC/BMP drawn today; results to be faxed to [phone]. Dr. Philip Aspen and his nurse can be reached at [phone].   POF in for lab appt prior to chemo; labs to be drawn with IV access.

## 2015-07-08 LAB — TYPE AND SCREEN
ABO/RH(D): B NEG
Antibody Screen: NEGATIVE
UNIT DIVISION: 0
Unit division: 0

## 2015-07-13 ENCOUNTER — Ambulatory Visit (HOSPITAL_BASED_OUTPATIENT_CLINIC_OR_DEPARTMENT_OTHER): Payer: Medicare Other

## 2015-07-13 ENCOUNTER — Ambulatory Visit (HOSPITAL_BASED_OUTPATIENT_CLINIC_OR_DEPARTMENT_OTHER): Payer: Medicare Other | Admitting: Nurse Practitioner

## 2015-07-13 ENCOUNTER — Inpatient Hospital Stay (HOSPITAL_COMMUNITY): Payer: Medicare Other

## 2015-07-13 ENCOUNTER — Telehealth: Payer: Self-pay | Admitting: *Deleted

## 2015-07-13 ENCOUNTER — Inpatient Hospital Stay (HOSPITAL_COMMUNITY)
Admission: AD | Admit: 2015-07-13 | Discharge: 2015-07-25 | DRG: 871 | Disposition: E | Payer: Medicare Other | Source: Ambulatory Visit | Attending: Internal Medicine | Admitting: Internal Medicine

## 2015-07-13 ENCOUNTER — Encounter (HOSPITAL_COMMUNITY): Payer: Self-pay | Admitting: *Deleted

## 2015-07-13 ENCOUNTER — Other Ambulatory Visit: Payer: Self-pay | Admitting: *Deleted

## 2015-07-13 VITALS — BP 113/49 | HR 115 | Temp 98.0°F | Resp 16

## 2015-07-13 DIAGNOSIS — R161 Splenomegaly, not elsewhere classified: Secondary | ICD-10-CM | POA: Diagnosis present

## 2015-07-13 DIAGNOSIS — J9811 Atelectasis: Secondary | ICD-10-CM | POA: Diagnosis present

## 2015-07-13 DIAGNOSIS — N182 Chronic kidney disease, stage 2 (mild): Secondary | ICD-10-CM | POA: Diagnosis present

## 2015-07-13 DIAGNOSIS — E785 Hyperlipidemia, unspecified: Secondary | ICD-10-CM | POA: Diagnosis present

## 2015-07-13 DIAGNOSIS — Z7189 Other specified counseling: Secondary | ICD-10-CM | POA: Diagnosis not present

## 2015-07-13 DIAGNOSIS — J9690 Respiratory failure, unspecified, unspecified whether with hypoxia or hypercapnia: Secondary | ICD-10-CM | POA: Diagnosis not present

## 2015-07-13 DIAGNOSIS — R627 Adult failure to thrive: Secondary | ICD-10-CM | POA: Insufficient documentation

## 2015-07-13 DIAGNOSIS — I5033 Acute on chronic diastolic (congestive) heart failure: Secondary | ICD-10-CM | POA: Diagnosis present

## 2015-07-13 DIAGNOSIS — N189 Chronic kidney disease, unspecified: Secondary | ICD-10-CM | POA: Diagnosis not present

## 2015-07-13 DIAGNOSIS — R509 Fever, unspecified: Secondary | ICD-10-CM

## 2015-07-13 DIAGNOSIS — R7989 Other specified abnormal findings of blood chemistry: Secondary | ICD-10-CM

## 2015-07-13 DIAGNOSIS — M109 Gout, unspecified: Secondary | ICD-10-CM | POA: Diagnosis present

## 2015-07-13 DIAGNOSIS — I776 Arteritis, unspecified: Secondary | ICD-10-CM | POA: Diagnosis present

## 2015-07-13 DIAGNOSIS — E039 Hypothyroidism, unspecified: Secondary | ICD-10-CM | POA: Diagnosis present

## 2015-07-13 DIAGNOSIS — Z6822 Body mass index (BMI) 22.0-22.9, adult: Secondary | ICD-10-CM

## 2015-07-13 DIAGNOSIS — D649 Anemia, unspecified: Secondary | ICD-10-CM

## 2015-07-13 DIAGNOSIS — Z9181 History of falling: Secondary | ICD-10-CM | POA: Diagnosis not present

## 2015-07-13 DIAGNOSIS — R0600 Dyspnea, unspecified: Secondary | ICD-10-CM | POA: Diagnosis not present

## 2015-07-13 DIAGNOSIS — H9190 Unspecified hearing loss, unspecified ear: Secondary | ICD-10-CM | POA: Diagnosis present

## 2015-07-13 DIAGNOSIS — R6510 Systemic inflammatory response syndrome (SIRS) of non-infectious origin without acute organ dysfunction: Secondary | ICD-10-CM | POA: Insufficient documentation

## 2015-07-13 DIAGNOSIS — E43 Unspecified severe protein-calorie malnutrition: Secondary | ICD-10-CM | POA: Diagnosis present

## 2015-07-13 DIAGNOSIS — IMO0001 Reserved for inherently not codable concepts without codable children: Secondary | ICD-10-CM | POA: Insufficient documentation

## 2015-07-13 DIAGNOSIS — R238 Other skin changes: Secondary | ICD-10-CM | POA: Diagnosis present

## 2015-07-13 DIAGNOSIS — R0602 Shortness of breath: Secondary | ICD-10-CM | POA: Insufficient documentation

## 2015-07-13 DIAGNOSIS — I4891 Unspecified atrial fibrillation: Secondary | ICD-10-CM | POA: Diagnosis not present

## 2015-07-13 DIAGNOSIS — Z515 Encounter for palliative care: Secondary | ICD-10-CM | POA: Insufficient documentation

## 2015-07-13 DIAGNOSIS — K219 Gastro-esophageal reflux disease without esophagitis: Secondary | ICD-10-CM | POA: Diagnosis present

## 2015-07-13 DIAGNOSIS — D72829 Elevated white blood cell count, unspecified: Secondary | ICD-10-CM

## 2015-07-13 DIAGNOSIS — I130 Hypertensive heart and chronic kidney disease with heart failure and stage 1 through stage 4 chronic kidney disease, or unspecified chronic kidney disease: Secondary | ICD-10-CM | POA: Diagnosis present

## 2015-07-13 DIAGNOSIS — E559 Vitamin D deficiency, unspecified: Secondary | ICD-10-CM | POA: Diagnosis present

## 2015-07-13 DIAGNOSIS — J9601 Acute respiratory failure with hypoxia: Secondary | ICD-10-CM | POA: Diagnosis present

## 2015-07-13 DIAGNOSIS — D473 Essential (hemorrhagic) thrombocythemia: Secondary | ICD-10-CM | POA: Diagnosis present

## 2015-07-13 DIAGNOSIS — Y95 Nosocomial condition: Secondary | ICD-10-CM | POA: Diagnosis present

## 2015-07-13 DIAGNOSIS — Z8249 Family history of ischemic heart disease and other diseases of the circulatory system: Secondary | ICD-10-CM | POA: Diagnosis not present

## 2015-07-13 DIAGNOSIS — R609 Edema, unspecified: Secondary | ICD-10-CM

## 2015-07-13 DIAGNOSIS — E860 Dehydration: Secondary | ICD-10-CM | POA: Diagnosis present

## 2015-07-13 DIAGNOSIS — Z79899 Other long term (current) drug therapy: Secondary | ICD-10-CM

## 2015-07-13 DIAGNOSIS — I442 Atrioventricular block, complete: Secondary | ICD-10-CM | POA: Diagnosis present

## 2015-07-13 DIAGNOSIS — Z66 Do not resuscitate: Secondary | ICD-10-CM | POA: Diagnosis not present

## 2015-07-13 DIAGNOSIS — F0390 Unspecified dementia without behavioral disturbance: Secondary | ICD-10-CM | POA: Diagnosis present

## 2015-07-13 DIAGNOSIS — A419 Sepsis, unspecified organism: Secondary | ICD-10-CM | POA: Insufficient documentation

## 2015-07-13 DIAGNOSIS — M609 Myositis, unspecified: Secondary | ICD-10-CM | POA: Diagnosis present

## 2015-07-13 DIAGNOSIS — S60522A Blister (nonthermal) of left hand, initial encounter: Secondary | ICD-10-CM

## 2015-07-13 DIAGNOSIS — Z7982 Long term (current) use of aspirin: Secondary | ICD-10-CM | POA: Diagnosis not present

## 2015-07-13 DIAGNOSIS — D469 Myelodysplastic syndrome, unspecified: Secondary | ICD-10-CM | POA: Diagnosis present

## 2015-07-13 DIAGNOSIS — I459 Conduction disorder, unspecified: Secondary | ICD-10-CM | POA: Diagnosis present

## 2015-07-13 DIAGNOSIS — D691 Qualitative platelet defects: Secondary | ICD-10-CM | POA: Diagnosis present

## 2015-07-13 DIAGNOSIS — R4182 Altered mental status, unspecified: Secondary | ICD-10-CM | POA: Diagnosis not present

## 2015-07-13 DIAGNOSIS — D631 Anemia in chronic kidney disease: Secondary | ICD-10-CM | POA: Diagnosis not present

## 2015-07-13 DIAGNOSIS — S60529A Blister (nonthermal) of unspecified hand, initial encounter: Secondary | ICD-10-CM | POA: Diagnosis not present

## 2015-07-13 DIAGNOSIS — R269 Unspecified abnormalities of gait and mobility: Secondary | ICD-10-CM | POA: Diagnosis present

## 2015-07-13 DIAGNOSIS — J439 Emphysema, unspecified: Secondary | ICD-10-CM | POA: Diagnosis present

## 2015-07-13 DIAGNOSIS — Z888 Allergy status to other drugs, medicaments and biological substances status: Secondary | ICD-10-CM

## 2015-07-13 DIAGNOSIS — R040 Epistaxis: Secondary | ICD-10-CM | POA: Diagnosis present

## 2015-07-13 DIAGNOSIS — M25529 Pain in unspecified elbow: Secondary | ICD-10-CM | POA: Insufficient documentation

## 2015-07-13 DIAGNOSIS — D509 Iron deficiency anemia, unspecified: Secondary | ICD-10-CM | POA: Diagnosis not present

## 2015-07-13 DIAGNOSIS — M79622 Pain in left upper arm: Secondary | ICD-10-CM | POA: Diagnosis not present

## 2015-07-13 DIAGNOSIS — R29898 Other symptoms and signs involving the musculoskeletal system: Secondary | ICD-10-CM | POA: Diagnosis not present

## 2015-07-13 DIAGNOSIS — D638 Anemia in other chronic diseases classified elsewhere: Secondary | ICD-10-CM | POA: Diagnosis not present

## 2015-07-13 DIAGNOSIS — R296 Repeated falls: Secondary | ICD-10-CM | POA: Diagnosis present

## 2015-07-13 DIAGNOSIS — R531 Weakness: Secondary | ICD-10-CM | POA: Diagnosis not present

## 2015-07-13 DIAGNOSIS — G629 Polyneuropathy, unspecified: Secondary | ICD-10-CM | POA: Diagnosis present

## 2015-07-13 DIAGNOSIS — G8929 Other chronic pain: Secondary | ICD-10-CM | POA: Diagnosis present

## 2015-07-13 DIAGNOSIS — L03114 Cellulitis of left upper limb: Secondary | ICD-10-CM | POA: Diagnosis present

## 2015-07-13 DIAGNOSIS — R0902 Hypoxemia: Secondary | ICD-10-CM

## 2015-07-13 DIAGNOSIS — J90 Pleural effusion, not elsewhere classified: Secondary | ICD-10-CM | POA: Diagnosis not present

## 2015-07-13 DIAGNOSIS — R945 Abnormal results of liver function studies: Secondary | ICD-10-CM

## 2015-07-13 DIAGNOSIS — C946 Myelodysplastic disease, not classified: Secondary | ICD-10-CM | POA: Diagnosis not present

## 2015-07-13 DIAGNOSIS — J189 Pneumonia, unspecified organism: Secondary | ICD-10-CM | POA: Diagnosis present

## 2015-07-13 DIAGNOSIS — R52 Pain, unspecified: Secondary | ICD-10-CM

## 2015-07-13 DIAGNOSIS — I878 Other specified disorders of veins: Secondary | ICD-10-CM | POA: Diagnosis not present

## 2015-07-13 DIAGNOSIS — M79621 Pain in right upper arm: Secondary | ICD-10-CM | POA: Diagnosis not present

## 2015-07-13 LAB — URINALYSIS, ROUTINE W REFLEX MICROSCOPIC
Glucose, UA: NEGATIVE mg/dL
Hgb urine dipstick: NEGATIVE
KETONES UR: NEGATIVE mg/dL
Leukocytes, UA: NEGATIVE
NITRITE: NEGATIVE
Protein, ur: NEGATIVE mg/dL
SPECIFIC GRAVITY, URINE: 1.017 (ref 1.005–1.030)
UROBILINOGEN UA: 1 mg/dL (ref 0.0–1.0)
pH: 5 (ref 5.0–8.0)

## 2015-07-13 LAB — COMPREHENSIVE METABOLIC PANEL
ALBUMIN: 2 g/dL — AB (ref 3.5–5.0)
ALT: 33 U/L (ref 17–63)
ANION GAP: 8 (ref 5–15)
AST: 41 U/L (ref 15–41)
Alkaline Phosphatase: 130 U/L — ABNORMAL HIGH (ref 38–126)
BILIRUBIN TOTAL: 1.4 mg/dL — AB (ref 0.3–1.2)
BUN: 34 mg/dL — ABNORMAL HIGH (ref 6–20)
CO2: 27 mmol/L (ref 22–32)
Calcium: 8 mg/dL — ABNORMAL LOW (ref 8.9–10.3)
Chloride: 95 mmol/L — ABNORMAL LOW (ref 101–111)
Creatinine, Ser: 1.18 mg/dL (ref 0.61–1.24)
GFR calc Af Amer: >60 mL/min (ref >60)
GFR calc non Af Amer: 53 mL/min — ABNORMAL LOW (ref >60)
Glucose, Bld: 126 mg/dL — ABNORMAL HIGH (ref 65–99)
POTASSIUM: 3.9 mmol/L (ref 3.5–5.1)
SODIUM: 130 mmol/L — AB (ref 135–145)
TOTAL PROTEIN: 5.7 g/dL — AB (ref 6.5–8.1)

## 2015-07-13 LAB — HOLD TUBE, BLOOD BANK

## 2015-07-13 LAB — PREPARE RBC (CROSSMATCH)

## 2015-07-13 LAB — PROCALCITONIN: Procalcitonin: 0.58 ng/mL

## 2015-07-13 LAB — CBC WITH DIFFERENTIAL/PLATELET
BASO%: 0.4 % (ref 0.0–2.0)
BASOS ABS: 0.1 10*3/uL (ref 0.0–0.1)
EOS ABS: 1.5 10*3/uL — AB (ref 0.0–0.5)
EOS%: 4.9 % (ref 0.0–7.0)
HEMATOCRIT: 25.3 % — AB (ref 38.4–49.9)
HGB: 7.9 g/dL — ABNORMAL LOW (ref 13.0–17.1)
LYMPH%: 0.7 % — ABNORMAL LOW (ref 14.0–49.0)
MCH: 28.1 pg (ref 27.2–33.4)
MCHC: 31.2 g/dL — AB (ref 32.0–36.0)
MCV: 90.2 fL (ref 79.3–98.0)
MONO#: 0.1 10*3/uL (ref 0.1–0.9)
MONO%: 0.2 % (ref 0.0–14.0)
NEUT#: 27.7 10*3/uL — ABNORMAL HIGH (ref 1.5–6.5)
NEUT%: 93.8 % — AB (ref 39.0–75.0)
Platelets: 405 10*3/uL — ABNORMAL HIGH (ref 140–400)
RBC: 2.81 10*6/uL — ABNORMAL LOW (ref 4.20–5.82)
RDW: 21.6 % — ABNORMAL HIGH (ref 11.0–14.6)
WBC: 29.6 10*3/uL — ABNORMAL HIGH (ref 4.0–10.3)
lymph#: 0.2 10*3/uL — ABNORMAL LOW (ref 0.9–3.3)

## 2015-07-13 LAB — TECHNOLOGIST REVIEW

## 2015-07-13 LAB — TSH: TSH: 2.048 u[IU]/mL (ref 0.350–4.500)

## 2015-07-13 LAB — URIC ACID: URIC ACID, SERUM: 6.7 mg/dL (ref 4.4–7.6)

## 2015-07-13 LAB — SEDIMENTATION RATE: Sed Rate: 124 mm/hr — ABNORMAL HIGH (ref 0–16)

## 2015-07-13 LAB — CHCC SMEAR

## 2015-07-13 LAB — C-REACTIVE PROTEIN: CRP: 23.8 mg/dL — AB (ref <1.0)

## 2015-07-13 LAB — TROPONIN I: TROPONIN I: 0.03 ng/mL (ref <0.031)

## 2015-07-13 LAB — T4, FREE: Free T4: 1.36 ng/dL — ABNORMAL HIGH (ref 0.61–1.12)

## 2015-07-13 LAB — MAGNESIUM: Magnesium: 2 mg/dL (ref 1.7–2.4)

## 2015-07-13 LAB — BRAIN NATRIURETIC PEPTIDE: B Natriuretic Peptide: 470.8 pg/mL — ABNORMAL HIGH (ref 0.0–100.0)

## 2015-07-13 LAB — LACTIC ACID, PLASMA: LACTIC ACID, VENOUS: 1.1 mmol/L (ref 0.5–2.0)

## 2015-07-13 MED ORDER — ASPIRIN 81 MG PO CHEW
81.0000 mg | CHEWABLE_TABLET | Freq: Every day | ORAL | Status: DC
  Administered 2015-07-14 – 2015-07-17 (×4): 81 mg via ORAL
  Filled 2015-07-13 (×4): qty 1

## 2015-07-13 MED ORDER — SODIUM CHLORIDE 0.9 % IJ SOLN
3.0000 mL | Freq: Two times a day (BID) | INTRAMUSCULAR | Status: DC
  Administered 2015-07-14 – 2015-07-21 (×12): 3 mL via INTRAVENOUS

## 2015-07-13 MED ORDER — HEPARIN SOD (PORK) LOCK FLUSH 100 UNIT/ML IV SOLN: 250.0000 [IU] | INTRAVENOUS | Status: DC | PRN

## 2015-07-13 MED ORDER — PIPERACILLIN-TAZOBACTAM 3.375 G IVPB 30 MIN
3.3750 g | Freq: Once | INTRAVENOUS | Status: AC
  Administered 2015-07-13: 3.375 g via INTRAVENOUS
  Filled 2015-07-13 (×2): qty 50

## 2015-07-13 MED ORDER — SODIUM CHLORIDE 0.9 % IJ SOLN: 3.0000 mL | INTRAMUSCULAR | Status: DC | PRN

## 2015-07-13 MED ORDER — VANCOMYCIN HCL IN DEXTROSE 1-5 GM/200ML-% IV SOLN
1000.0000 mg | Freq: Once | INTRAVENOUS | Status: AC
  Administered 2015-07-13: 1000 mg via INTRAVENOUS
  Filled 2015-07-13: qty 200

## 2015-07-13 MED ORDER — LEVOTHYROXINE SODIUM 50 MCG PO TABS
50.0000 ug | ORAL_TABLET | Freq: Every day | ORAL | Status: DC
  Administered 2015-07-14 – 2015-07-19 (×5): 50 ug via ORAL
  Filled 2015-07-13 (×6): qty 1

## 2015-07-13 MED ORDER — SODIUM CHLORIDE 0.9 % IV SOLN: 250.0000 mL | Freq: Once | INTRAVENOUS | Status: DC

## 2015-07-13 MED ORDER — SODIUM CHLORIDE 0.9 % IV BOLUS (SEPSIS)
1000.0000 mL | Freq: Once | INTRAVENOUS | Status: AC
  Administered 2015-07-13: 1000 mL via INTRAVENOUS

## 2015-07-13 MED ORDER — ONDANSETRON HCL 4 MG PO TABS: 4.0000 mg | ORAL_TABLET | Freq: Four times a day (QID) | ORAL | Status: DC | PRN

## 2015-07-13 MED ORDER — ONDANSETRON HCL 4 MG/2ML IJ SOLN: 4.0000 mg | Freq: Four times a day (QID) | INTRAMUSCULAR | Status: DC | PRN

## 2015-07-13 MED ORDER — VANCOMYCIN HCL IN DEXTROSE 1-5 GM/200ML-% IV SOLN
1000.0000 mg | INTRAVENOUS | Status: DC
  Administered 2015-07-14 – 2015-07-15 (×2): 1000 mg via INTRAVENOUS
  Filled 2015-07-13 (×2): qty 200

## 2015-07-13 MED ORDER — SODIUM CHLORIDE 0.9 % IJ SOLN: 10.0000 mL | INTRAMUSCULAR | Status: DC | PRN

## 2015-07-13 MED ORDER — METOPROLOL TARTRATE 1 MG/ML IV SOLN
5.0000 mg | Freq: Three times a day (TID) | INTRAVENOUS | Status: DC
  Administered 2015-07-13 – 2015-07-22 (×25): 5 mg via INTRAVENOUS
  Filled 2015-07-13 (×25): qty 5

## 2015-07-13 MED ORDER — HEPARIN SOD (PORK) LOCK FLUSH 100 UNIT/ML IV SOLN: 500.0000 [IU] | Freq: Every day | INTRAVENOUS | Status: DC | PRN

## 2015-07-13 MED ORDER — POTASSIUM CHLORIDE CRYS ER 20 MEQ PO TBCR
20.0000 meq | EXTENDED_RELEASE_TABLET | Freq: Every day | ORAL | Status: DC
  Administered 2015-07-14 – 2015-07-19 (×5): 20 meq via ORAL
  Filled 2015-07-13 (×5): qty 1

## 2015-07-13 MED ORDER — ENSURE ENLIVE PO LIQD
237.0000 mL | Freq: Three times a day (TID) | ORAL | Status: DC
  Administered 2015-07-14 – 2015-07-19 (×11): 237 mL via ORAL

## 2015-07-13 MED ORDER — MORPHINE SULFATE (PF) 2 MG/ML IV SOLN
1.0000 mg | INTRAVENOUS | Status: DC | PRN
  Administered 2015-07-15 – 2015-07-16 (×2): 1 mg via INTRAVENOUS
  Filled 2015-07-13 (×3): qty 1

## 2015-07-13 MED ORDER — ACETAMINOPHEN 325 MG PO TABS
650.0000 mg | ORAL_TABLET | Freq: Four times a day (QID) | ORAL | Status: DC | PRN
  Administered 2015-07-15 (×2): 325 mg via ORAL
  Administered 2015-07-17 (×2): 650 mg via ORAL
  Filled 2015-07-13 (×4): qty 2

## 2015-07-13 MED ORDER — HEPARIN SOD (PORK) LOCK FLUSH 100 UNIT/ML IV SOLN
250.0000 [IU] | INTRAVENOUS | Status: DC | PRN
  Filled 2015-07-13: qty 3

## 2015-07-13 MED ORDER — LEVALBUTEROL HCL 0.63 MG/3ML IN NEBU: 0.6300 mg | INHALATION_SOLUTION | Freq: Three times a day (TID) | RESPIRATORY_TRACT | Status: DC | PRN

## 2015-07-13 MED ORDER — FUROSEMIDE 10 MG/ML IJ SOLN: 20.0000 mg | Freq: Two times a day (BID) | INTRAMUSCULAR | Status: DC

## 2015-07-13 MED ORDER — POLYETHYLENE GLYCOL 3350 17 G PO PACK: 17.0000 g | PACK | Freq: Every day | ORAL | Status: DC | PRN

## 2015-07-13 MED ORDER — FERROUS SULFATE 325 (65 FE) MG PO TABS
325.0000 mg | ORAL_TABLET | Freq: Every day | ORAL | Status: DC
  Administered 2015-07-14 – 2015-07-19 (×5): 325 mg via ORAL
  Filled 2015-07-13 (×6): qty 1

## 2015-07-13 MED ORDER — ACETAMINOPHEN 650 MG RE SUPP
650.0000 mg | Freq: Four times a day (QID) | RECTAL | Status: DC | PRN
  Administered 2015-07-19 – 2015-07-21 (×4): 650 mg via RECTAL
  Filled 2015-07-13 (×4): qty 1

## 2015-07-13 MED ORDER — SODIUM CHLORIDE 0.9 % IV SOLN
INTRAVENOUS | Status: DC
  Administered 2015-07-13 – 2015-07-16 (×3): via INTRAVENOUS

## 2015-07-13 MED ORDER — SODIUM CHLORIDE 0.9 % IJ SOLN
10.0000 mL | INTRAMUSCULAR | Status: AC | PRN
  Administered 2015-07-18: 10 mL

## 2015-07-13 MED ORDER — HEPARIN SOD (PORK) LOCK FLUSH 100 UNIT/ML IV SOLN
500.0000 [IU] | Freq: Every day | INTRAVENOUS | Status: DC | PRN
  Filled 2015-07-13: qty 5

## 2015-07-13 MED ORDER — OXYCODONE HCL 5 MG PO TABS
5.0000 mg | ORAL_TABLET | ORAL | Status: DC | PRN
Start: 2015-07-13 — End: 2015-07-22
  Administered 2015-07-15 – 2015-07-20 (×6): 5 mg via ORAL
  Filled 2015-07-13 (×6): qty 1

## 2015-07-13 MED ORDER — PIPERACILLIN-TAZOBACTAM 3.375 G IVPB
3.3750 g | Freq: Three times a day (TID) | INTRAVENOUS | Status: DC
  Administered 2015-07-14 – 2015-07-16 (×8): 3.375 g via INTRAVENOUS
  Filled 2015-07-13 (×8): qty 50

## 2015-07-13 NOTE — Progress Notes (Signed)
ANTIBIOTIC CONSULT NOTE - INITIAL  Pharmacy Consult for Vancomycin and Zosyn Indication: HCAP  Allergies  Allergen Reactions  . Ace Inhibitors Cough    Patient Measurements: Height: 5\' 11"  (180.3 cm) IBW/kg (Calculated) : 75.3  Vital Signs: Temp: 98 F (36.7 C) (10/20 1553) Temp Source: Oral (10/20 1553) BP: 118/62 mmHg (10/20 1553) Pulse Rate: 133 (10/20 1553) Intake/Output from previous day:   Intake/Output from this shift:    Labs:  Recent Labs  07/08/2015 1357 07/21/2015 1833  WBC 29.6*  --   HGB 7.9*  --   PLT 405*  --   CREATININE  --  1.18   CrCl cannot be calculated (Unknown ideal weight.). No results for input(s): VANCOTROUGH, VANCOPEAK, VANCORANDOM, GENTTROUGH, GENTPEAK, GENTRANDOM, TOBRATROUGH, TOBRAPEAK, TOBRARND, AMIKACINPEAK, AMIKACINTROU, AMIKACIN in the last 72 hours.   Microbiology: Recent Results (from the past 720 hour(s))  TECHNOLOGIST REVIEW     Status: None   Collection Time: 06/20/15  2:02 PM  Result Value Ref Range Status   Technologist Review   Final    Few hypersegmented neutrophils,large and giant plts  TECHNOLOGIST REVIEW     Status: None   Collection Time: 07/04/15  1:49 PM  Result Value Ref Range Status   Technologist Review 2% Blasts, Few Metas and Myelos, Occ Giant Plt  Final  TECHNOLOGIST REVIEW     Status: None   Collection Time: 07/07/15 12:16 PM  Result Value Ref Range Status   Technologist Review 3%Metas and 3% Nrbcs, Rare Promyelocyte and Blast   Final  TECHNOLOGIST REVIEW     Status: None   Collection Time: 07/03/2015  1:57 PM  Result Value Ref Range Status   Technologist Review   Final    Few Metas and Myelocytes present, rare blast and nRBC, hypersegmented neutrophils    Medical History: Past Medical History  Diagnosis Date  . Hypertension   . Hyperlipidemia   . Pre-diabetes   . Thyroid disease   . Thrombocytosis ([place])   . GERD (gastroesophageal reflux disease)   . Vitamin D deficiency   . Vision abnormalities    . Neuropathy ([place])   . Syncope and collapse     Medications:  Scheduled:  . sodium chloride  250 mL Intravenous Once  . [START ON 07/14/2015] aspirin  81 mg Oral Daily  . feeding supplement (ENSURE ENLIVE)  237 mL Oral TID BM  . [START ON 07/14/2015] ferrous sulfate  325 mg Oral Q breakfast  . [START ON 07/14/2015] levothyroxine  50 mcg Oral QAC breakfast  . metoprolol  5 mg Intravenous Q8H  . piperacillin-tazobactam  3.375 g Intravenous Once  . [START ON 07/14/2015] piperacillin-tazobactam (ZOSYN)  IV  3.375 g Intravenous Q8H  . [START ON 07/14/2015] potassium chloride SA  20 mEq Oral Daily  . sodium chloride  1,000 mL Intravenous Once  . sodium chloride  3 mL Intravenous Q12H  . vancomycin  1,000 mg Intravenous Once  . [START ON 07/14/2015] vancomycin  1,000 mg Intravenous Q24H   Infusions:  . sodium chloride     PRN: acetaminophen **OR** acetaminophen, heparin lock flush, heparin lock flush, levalbuterol, morphine injection, ondansetron **OR** ondansetron (ZOFRAN) IV, oxyCODONE, polyethylene glycol, sodium chloride, sodium chloride  Assessment: 81 yo male with essential thrombocytosis maintained on hydroxyurea presents from NH with increased lethargy, confusion and tachycardia. He was hospitalized in early September for sepsis and received vanc/zosyn at that time.    Goal of Therapy:  Vancomycin trough level 15-20 mcg/ml  Antibiotic dosing appropriate for  indication and renal function  Plan:   Vancomycin 1g IV q24h Check trough at steady state Zosyn 3.375gm IV q8h (4hr extended infusions) Follow up renal function & cultures, clinical course  Peggyann Juba, PharmD, BCPS Pager: [phone] 07/17/2015,7:37 PM

## 2015-07-13 NOTE — Consult Note (Addendum)
CONSULT NOTE  Date: 07/20/2015               Patient Name:  Jack Schultz MRN: [id number]  DOB: 12-06-1923 Age / Sex: 81 y.o., male        PCP: [place] Primary Cardiologist: Stanford Breed            Referring Physician: [place]              Reason for Consult: Atrial fib.            History of Present Illness: Patient is a 81 y.o. male with a PMHx of myelodysplastic syndrome, HTN, HL  , who was admitted to [facility] on 07/05/2015 for evaluation of with generalized weakness and palpitations. He is found have a white count of 29,000. He was also found to have rapid atrial fibrillation. Marland Kitchen He was originally seen in the oncology office and was admitted to the hospital as a direct admission.  Chest x-ray on admission reveals changes consistent with emphysema. He also has by basilar infiltrates.   History of poorly for the past several weeks. He's had a very reduced PO intake.  He's had a cough. He's been very weak and debilitated. He was seen by his oncologist today and was admitted to the hospital.  He's had H fibrillation before. He is been thought to be a very poor candidate for anticoagulation given his high risk of bleeding and his myeloproliferative disorder.  Medications: Outpatient medications: Prescriptions prior to admission  Medication Sig Dispense Refill Last Dose  . acetaminophen (TYLENOL) 500 MG tablet Take 500 mg by mouth every 6 (six) hours as needed for headache.   07/12/2015 at Unknown time  . aspirin 81 MG tablet Take 81 mg by mouth daily.   07/17/2015 at 0900  . cefdinir (OMNICEF) 300 MG capsule Take 300 mg by mouth at bedtime. ABT Start Date 07/07/15 & End Date 07/10/2015.   07/12/2015 at 2100  . Cholecalciferol (VITAMIN D PO) Take 5,000 Units by mouth daily.   07/21/2015 at 0900  . feeding supplement, ENSURE ENLIVE, (ENSURE ENLIVE) LIQD Take 237 mLs by mouth 2 (two) times daily between meals. (Patient taking differently: Take 237 mLs by mouth 4  (four) times daily. ) 237 mL 12 06/27/2015 at 0900  . ferrous sulfate 325 (65 FE) MG tablet Take 325 mg by mouth daily with breakfast.   07/24/2015 at 0800  . furosemide (LASIX) 40 MG tablet Take 40 mg by mouth daily.   07/20/2015 at 0900  . hydroxyurea (HYDREA) 500 MG capsule Take 500 mg by mouth daily.   07/04/2015 at 0900  . levothyroxine (SYNTHROID, LEVOTHROID) 50 MCG tablet Take 50 mcg by mouth daily.   07/08/2015 at 0630  . metoprolol tartrate (LOPRESSOR) 25 MG tablet Take 0.5 tablets (12.5 mg total) by mouth 2 (two) times daily. (Patient taking differently: Take 25 mg by mouth 2 (two) times daily. )   07/17/2015 at 0900  . potassium chloride SA (K-DUR,KLOR-CON) 20 MEQ tablet Take 1 tablet (20 mEq total) by mouth daily. 5 tablet 0 07/05/2015 at 0900  . acetaminophen (TYLENOL) 650 MG suppository Place 1 suppository (650 mg total) rectally every 6 (six) hours as needed for mild pain (or Fever >/= 101). 12 suppository 0 unknown at unknown time  . polyethylene glycol (MIRALAX / GLYCOLAX) packet Take 17 g by mouth daily as needed for mild constipation. 14 each 0 unknown at unknown time    Current medications: Current  Facility-Administered Medications  Medication Dose Route Frequency Provider Last Rate Last Dose  . 0.9 %  sodium chloride infusion  250 mL Intravenous Once Owens Shark, NP      . 0.9 %  sodium chloride infusion   Intravenous Continuous Barton Dubois, MD      . acetaminophen (TYLENOL) tablet 650 mg  650 mg Oral Q6H PRN Barton Dubois, MD       Or  . acetaminophen (TYLENOL) suppository 650 mg  650 mg Rectal Q6H PRN Barton Dubois, MD      . aspirin tablet 81 mg  81 mg Oral Daily Barton Dubois, MD      . feeding supplement (ENSURE ENLIVE) (ENSURE ENLIVE) liquid 237 mL  237 mL Oral TID BM Barton Dubois, MD      . Derrill Memo ON 07/14/2015] ferrous sulfate tablet 325 mg  325 mg Oral Q breakfast Barton Dubois, MD      . heparin lock flush 100 unit/mL  500 Units Intracatheter Daily PRN Owens Shark, NP      . heparin lock flush 100 unit/mL  250 Units Intracatheter PRN Owens Shark, NP      . levalbuterol Penne Lash) nebulizer solution 0.63 mg  0.63 mg Nebulization Q8H PRN Barton Dubois, MD      . levothyroxine (SYNTHROID, LEVOTHROID) tablet 50 mcg  50 mcg Oral Daily Barton Dubois, MD      . metoprolol (LOPRESSOR) injection 5 mg  5 mg Intravenous Q8H Barton Dubois, MD      . morphine 2 MG/ML injection 1 mg  1 mg Intravenous Q3H PRN Barton Dubois, MD      . ondansetron [facility]) tablet 4 mg  4 mg Oral Q6H PRN Barton Dubois, MD       Or  . ondansetron [facility]) injection 4 mg  4 mg Intravenous Q6H PRN Barton Dubois, MD      . oxyCODONE (Oxy IR/ROXICODONE) immediate release tablet 5 mg  5 mg Oral Q4H PRN Barton Dubois, MD      . piperacillin-tazobactam (ZOSYN) IVPB 3.375 g  3.375 g Intravenous Once Emiliano Dyer, RPH      . polyethylene glycol (MIRALAX / GLYCOLAX) packet 17 g  17 g Oral Daily PRN Barton Dubois, MD      . potassium chloride SA (K-DUR,KLOR-CON) CR tablet 20 mEq  20 mEq Oral Daily Barton Dubois, MD      . sodium chloride 0.9 % injection 10 mL  10 mL Intracatheter PRN Owens Shark, NP      . sodium chloride 0.9 % injection 3 mL  3 mL Intracatheter PRN Owens Shark, NP      . sodium chloride 0.9 % injection 3 mL  3 mL Intravenous Q12H Barton Dubois, MD      . vancomycin (VANCOCIN) IVPB 1000 mg/200 mL premix  1,000 mg Intravenous Once Emiliano Dyer, [place]       Facility-Administered Medications Ordered in Other Encounters  Medication Dose Route Frequency Provider Last Rate Last Dose  . iohexol (OMNIPAQUE) 300 MG/ML solution 25 mL  25 mL Oral Once PRN Medication Radiologist, MD         Allergies  Allergen Reactions  . Ace Inhibitors Cough     Past Medical History  Diagnosis Date  . Hypertension   . Hyperlipidemia   . Pre-diabetes   . Thyroid disease   . Thrombocytosis ([place])   . GERD (gastroesophageal reflux disease)   . Vitamin D deficiency   .  Vision  abnormalities   . Neuropathy ([place])   . Syncope and collapse     Past Surgical History  Procedure Laterality Date  . No past surgeries      Family History  Problem Relation Age of Onset  . Hypertension Father   . Heart disease Father     Social History:  reports that he quit smoking about 39 years ago. His smoking use included Cigarettes. He quit smokeless tobacco use about 6 weeks ago. His smokeless tobacco use included Snuff. He reports that he does not drink alcohol or use illicit drugs.   Review of Systems: Constitutional:  admits to   Poor appetite  and fatigue.  HEENT: denies photophobia, eye pain, redness, hearing loss, ear pain, congestion, sore throat, rhinorrhea, sneezing, neck pain, neck stiffness and tinnitus.  Respiratory: admits to SOB, DOE, cough,  Cardiovascular: denies chest pain, palpitations and leg swelling.  Gastrointestinal: denies nausea, vomiting, abdominal pain, diarrhea, constipation, blood in stool.  Genitourinary: denies dysuria, urgency, frequency, hematuria, flank pain and difficulty urinating.  Musculoskeletal: denies  myalgias, back pain, joint swelling, arthralgias and gait problem.   Skin: denies pallor, rash and wound.  Neurological: denies dizziness, seizures, syncope, weakness, light-headedness, numbness and headaches.   Hematological: denies adenopathy, easy bruising, personal or family bleeding history.  Psychiatric/ Behavioral: denies suicidal ideation, mood changes, confusion, nervousness, sleep disturbance and agitation.    Physical Exam: BP 118/62 mmHg  Pulse 133  Temp(Src) 98 F (36.7 C) (Oral)  Resp 18  Ht 5\' 11"  (1.803 m)  SpO2 91%  Wt Readings from Last 3 Encounters:  06/23/15 75.751 kg (167 lb)  06/13/15 75.7 kg (166 lb 14.2 oz)  05/25/15 78.563 kg (173 lb 3.2 oz)    General: Vital signs reviewed and noted. Chronically ill appearing elderly gentleman.   Head: Normocephalic, atraumatic, sclera anicteric,   Neck: Supple.  Negative for carotid bruits. Mild JVD , v waves   Lungs:  Few rhonchi   Heart: irreg irreg. with S1 S2. Soft murmurs,  Trace edema   Abdomen/ GI :  Soft, non-tender, non-distended with normoactive bowel sounds. No hepatomegaly. No rebound/guarding. No obvious abdominal masses   MSK: Strength and the appear normal for age.  Decreased skin turgur   Extremities: No clubbing or cyanosis. No edema.  Distal pedal pulses are 2+ and equal   Neurologic:  CN are grossly intact,  No obvious motor or sensory defect.  Alert and oriented X 3. Moves all extremities spontaneously.  Psych: Responds to questions appropriately with a normal affect.     Lab results: Basic Metabolic Panel:  Recent Labs Lab 07/07/15 1216  NA 133*  K 4.1  CO2 25  GLUCOSE 112  BUN 27.1*  CREATININE 1.0  CALCIUM 8.3*    Liver Function Tests: No results for input(s): AST, ALT, ALKPHOS, BILITOT, PROT, ALBUMIN in the last 168 hours. No results for input(s): LIPASE, AMYLASE in the last 168 hours. No results for input(s): AMMONIA in the last 168 hours.  CBC:  Recent Labs Lab 07/07/15 1216 07/11/2015 1357  WBC 13.7* 29.6*  NEUTROABS 13.1* 27.7*  HGB 9.8* 7.9*  HCT 30.9* 25.3*  MCV 90.2 90.2  PLT 395 405*    Cardiac Enzymes: No results for input(s): CKTOTAL, CKMB, CKMBINDEX, TROPONINI in the last 168 hours.  BNP: Invalid input(s): POCBNP  CBG: No results for input(s): GLUCAP in the last 168 hours.  Coagulation Studies: No results for input(s): LABPROT, INR in the last 72 hours.  Other results:  Personal review of tele:  Atrial fib , RVR   Imaging:  No results found.    Assessment & Plan:  1.  Atrial fibrillation with rapid ventricular response. The patient presents with symptoms of reduced appetite, generalized malaise, fatigue. He also has some palpitations. He was found have a white blood cell count of 29,000. His found have atrial fibrillation. I think that his atrial fibrillation is  secondary to his underlying medical illness which at this point is not clearly defined.  I think that he is on depleted and I do not think that he would tolerate a diltiazem drip and I do think that needs to receive some IV fluids for better hydration.  He is a poor candidate for anticoagulation given his myeloproliferative disorder .   I do not think he needs  a repeat echocardiogram at this point . He had 2 echocardiograms last month-  both of which show normal left ventricle systolic function.  2. Leukocytosis:  Further plans per Int. Med and Oncology .        Thayer Headings, Brooke Bonito., MD, [facility] 07/08/2015, 6:46 PM Office - [phone] Pager [phone]

## 2015-07-13 NOTE — Telephone Encounter (Addendum)
Joy, patient representative called from [place] facility [phone]) to inform MD Benay Spice of patient current status. Patient recent labs showed Hgb: 7.0, increased confusion, increase heart rate, and decrease oral intake. Fax labs in process. Message forwarded to MD Sher[place]/RN Lavella Lemons.

## 2015-07-13 NOTE — Progress Notes (Addendum)
[facility] OFFICE PROGRESS NOTE   Diagnosis:  Essential thrombocytosis  INTERVAL HISTORY:   Mr. Abalos returns prior to scheduled follow-up. We were contacted by the nursing facility today that he was more lethargic, confused, had a poor appetite and heart rate was increased.  His main complaints are "dry mouth and dizziness". He states that he is weak. He is unable to get out of the wheelchair. Appetite is poor. No fever. Family reports being told he had a "touch of pneumonia" recently. They are unsure if he took any antibiotics. He is short of breath. No nausea or vomiting. He denies any bleeding. He complains of pain at the left arm/elbow.   Objective:  Vital signs in last 24 hours:  Blood pressure 113/49, pulse 115, temperature 98 F (36.7 C), temperature source Oral, resp. rate 16, SpO2 95 %.   Ill-appearing elderly man. Resp: Rales at both lung bases Cardio: Irregular, rapid. GI: Soft. No organomegaly. Vascular: Trace lower leg edema. Neuro: Alert. Follows commands. He seems confused. Right leg appears weak.  Skin: Ecchymoses scattered over the forearms.  Musculoskeletal: Pitting edema left lateral forearm. Edema/tenderness at the left elbow.  Lab Results:  Lab Results  Component Value Date   WBC 29.6* 07/16/2015   HGB 7.9* 07/01/2015   HCT 25.3* 06/24/2015   MCV 90.2 07/24/2015   PLT 405* 07/08/2015   NEUTROABS 27.7* 07/19/2015   Peripheral blood smear: Few the peripheral smear from 07/05/2015 reveals marketed variation in red cell size and shape with numerous ovalocytes and a moderate number of teardrop forms. No nucleated red cells. The polychromasia is not increased. The platelet are increased in number with scattered giant platelet forms. The majority of the white cells are mature neutrophils. There are bilobed neutrophils, bands, and hypogranular neutrophils. No blasts.   Medications: I have reviewed the patient's current  medications.  Assessment/Plan: 1. Essential thrombocytosis-maintained on hydroxyurea for years . Currently taking hydroxyurea 500 mg daily.  2. Leukocytosis-secondary to the myeloproliferative disorder versus a systemic infection  3. History of Pericarditis  4. Sepsis syndrome, probable pneumonia September 2016  5. Anemia secondary to the underlying myeloproliferative disorder, hydroxyurea, and potentially chronic renal failure, status post red cell transfusion 07/05/2015 (1 unit) and 07/07/2015 (1 unit)  6. Chronic renal failure  7. History of recurrent epistaxis secondary to platelet dysfunction  8. Rapid atrial fibrillation  9. Gait disturbance status post recent evaluation by neurology.  10. Left arm/elbow pain. Question etiology, gout?, Infection?   Disposition: Mr. Levingston appears to be in rapid atrial fibrillation. In addition, he has progressive anemia which is likely contributing to the weakness and shortness of breath. Dr. Benay Spice recommends hospitalization for further evaluation/management of the atrial fibrillation. The hospitalist team will admit Mr. Mccuiston.  With regard to the anemia, Dr. Benay Spice will review the peripheral blood smear. Mr. Ion and his family understand he may need a bone marrow biopsy in the near future.    [name] ANP/GNP-BC   07/24/2015  3:03 PM  This was a shared visit with [name]. Mr. Stief was interviewed and examined. He has persistent severe anemia, likely secondary to chronic disease, the myeloproliferative disorder, renal failure, and potentially a systemic infection. I have a low clinical suspicion for evolution of the myeloproliferative disorder to leukemia.  He appears ill today. He appears to be in rapid atrial fibrillation. We will arrange for hospital admission for further evaluation.  I began a discussion regarding CODE STATUS with Mr. Empey and his  family.  Julieanne Manson, M.D.

## 2015-07-13 NOTE — Addendum Note (Signed)
Addended by: Rosalio Macadamia C on: 07/23/2015 11:59 AM   Modules accepted: Orders

## 2015-07-13 NOTE — Telephone Encounter (Signed)
CBC reviewed by Dr. Benay Spice: Work in today to be seen by APP. [place], spoke with Joy. She will contact pt's family to bring him in for lab/ office visit today. Arranged for possible transfusion after visit.

## 2015-07-13 NOTE — H&P (Signed)
[facility] History and Physical  BROLY HATFIELD YYQ:[id number] DOB: 12-Sep-1924 DOA: 07/23/2015  Referring physician: Dr. Benay Spice  PCP: [name], MD   Chief Complaint: SOB, generalized weakness, palpitations  HPI: Jack Schultz is a 81 y.o. male with PMH significant for HTN, HLD, Atrial fibrillation, essential thrombocytosis, GERD, presumed myelodysplastic syndrome, hypothyroidism and absolute anemia; who came as direct admission from oncology office due to generalized malaise, palpitations, SOB and FTT. Patient with recent admission due to PNA and atrial fibrillation. Was discharged to SNF and was initially doing ok; then his blood counts started to have drops on his Hgb and required 2 units of PRBC's. On the day of admission found to have elevated WBC's (up to 30,000), was on afib with RVR and Hgb of 7.9. Patient SOB and feeling very weak. [facility] has been called to admit patient for further evaluation and treatment. Of note, patient endorses anorexia and left elbow pain, swelling and decrease range of motion; he denies CP, abd pain, dysuria, melena, hematochezia, hemoptysis, HA's, focal neurologic weakness, nausea, vomiting or any other complaints.   Review of Systems:  Negative except as otherwise mentioned on HPI.  Past Medical History  Diagnosis Date  . Hypertension   . Hyperlipidemia   . Pre-diabetes   . Thyroid disease   . Thrombocytosis ([place])   . GERD (gastroesophageal reflux disease)   . Vitamin D deficiency   . Vision abnormalities   . Neuropathy ([place])   . Syncope and collapse    Past Surgical History  Procedure Laterality Date  . No past surgeries     Social History:  reports that he quit smoking about 39 years ago. His smoking use included Cigarettes. He quit smokeless tobacco use about 6 weeks ago. His smokeless tobacco use included Snuff. He reports that he does not drink alcohol or use illicit drugs.  Allergies  Allergen Reactions  . Ace  Inhibitors Cough    Family History  Problem Relation Age of Onset  . Hypertension Father   . Heart disease Father     Prior to Admission medications   Medication Sig Start Date End Date Taking? Authorizing Provider  acetaminophen (TYLENOL) 650 MG suppository Place 1 suppository (650 mg total) rectally every 6 (six) hours as needed for mild pain (or Fever >/= 101). 06/13/15   Belkys A Regalado, MD  aspirin 81 MG tablet Take 81 mg by mouth daily.    Historical Provider, MD  Cholecalciferol (VITAMIN D PO) Take 5,000 Units by mouth daily.    Historical Provider, MD  feeding supplement, ENSURE ENLIVE, (ENSURE ENLIVE) LIQD Take 237 mLs by mouth 2 (two) times daily between meals. 06/13/15   Belkys A Regalado, MD  ferrous sulfate 325 (65 FE) MG tablet Take 325 mg by mouth daily with breakfast.    Historical Provider, MD  furosemide (LASIX) 40 MG tablet Take 40 mg by mouth daily.    Historical Provider, MD  hydroxyurea (HYDREA) 500 MG capsule Take 500 mg by mouth daily.    Historical Provider, MD  levothyroxine (SYNTHROID, LEVOTHROID) 50 MCG tablet Take 50 mcg by mouth daily.    Historical Provider, MD  metoprolol tartrate (LOPRESSOR) 25 MG tablet Take 0.5 tablets (12.5 mg total) by mouth 2 (two) times daily. 06/23/15   Liliane Shi, PA-C  polyethylene glycol (MIRALAX / GLYCOLAX) packet Take 17 g by mouth daily as needed for mild constipation. 06/13/15   Belkys A Regalado, MD  potassium chloride SA (K-DUR,KLOR-CON) 20 MEQ tablet Take  1 tablet (20 mEq total) by mouth daily. 06/13/15   Elmarie Shiley, MD   Physical Exam: Filed Vitals:   07/19/2015 1553 06/25/2015 1700  BP: 118/62   Pulse: 133   Temp: 98 F (36.7 C)   TempSrc: Oral   Resp: 18   Height:  $Remove'5\' 11"'xoCDyTB$  (1.803 m)  SpO2: 91%     Wt Readings from Last 3 Encounters:  06/23/15 75.751 kg (167 lb)  06/13/15 75.7 kg (166 lb 14.2 oz)  05/25/15 78.563 kg (173 lb 3.2 oz)    General:  Appears chronically ill, afebrile and in mild distress due  to pain on his left elbow and SOB. Patient expressed been very weak Eyes: PERRL, normal lids, irises & conjunctiva, no icterus ENT: grossly normal hearing, dry MM, no erythema or exudates; no drainage out of ears or nostrils   Neck: no LAD, masses or thyromegaly, mild JVD, no bruits  Cardiovascular: irregular, no rubs or gallops, no LE edema Telemetry: SR, no arrhythmias  Respiratory: CTA bilaterally, no w/r/r. Normal respiratory effort. Abdomen: soft, nt, ND, positive BS Skin: no rash or induration seen on exam Musculoskeletal: decrease range of motion on his left elbow/forearm; pain with movement and warm to touch  Psychiatric: no hallucinations, flat affect  Neurologic: grossly non-focal.          Labs on Admission:  Basic Metabolic Panel:  Recent Labs Lab 07/07/15 1216  NA 133*  K 4.1  CO2 25  GLUCOSE 112  BUN 27.1*  CREATININE 1.0  CALCIUM 8.3*   CBC:  Recent Labs Lab 07/07/15 1216 07/20/2015 1357  WBC 13.7* 29.6*  NEUTROABS 13.1* 27.7*  HGB 9.8* 7.9*  HCT 30.9* 25.3*  MCV 90.2 90.2  PLT 395 405*   BNP (last 3 results)  Recent Labs  06/06/15 0512  BNP 425.4*   Radiological Exams on Admission: No results found.  EKG:  Ordered and pending at this moment. Telemetry with a. Fib and RVR  Assessment/Plan 1-Atrial fibrillation with RVR ([place]): patient with dehydration, anemia and presumably infection. Also has underlying atrial fibrillation. -will monitor on telemetry -will cycle troponin -will provide IVF's -oncology looking to transfuse 1 unit of PRBC's -will start IV lopressor -cardiology consulted and will follow rec's -will treat with broad spectrum antibiotics empirically for potential HCAP -will follow clinical response -will check EKG  2-SOB/SIRS: presumably secondary to HCAP. Other potential causes for SOB includes anemia and uncontrolled Afib  -will start empiric coverage for HCAP -flutter valve -will check CXR -will check ESR and  CRP -follow PNA protocol  -PRN xopenex -oxygen supplementation as needed  3-Essential thrombocytosis ([facility]) and concerns for myelodysplastic syndrome:  -actively follow by oncology service -will held hydrea due to anemia -will follow rec's -follow blood count trend  4-Hypothyroidism: will check TSH and Free T4 -will continue synthroid  5-Absolute anemia: oncology on board -plan is to most likely transfuse 1 unit of PRBC's -will follow rec's  6-CKD (chronic kidney disease) stage 2, GFR 60-89 ml/min: stable and at his baseline currently -will monitor trend  7-chronic diastolic HF (heart failure) ([place]): patient with mild rales at his bases, but no edema on his LE and no JVD. In fact appears to be slightly dry -will hold diuretics for now -will give fluid trial challenge -continue b-blocker -cardiology has been consulted -will check BNP -daily weights, strict intake and Output -heart healthy diet   8-General weakness: multifactorial: a. Fib with RVR, presumed PNA, deconditioning, severe protein calorie malnutrition  -will ask  PT/OT evaluation  9-GERD (gastroesophageal reflux disease): will continue use of PPI  10-Protein-calorie malnutrition, severe ([facility]) -will use ensure TID -nutritional service consulted  11-left elbow pain/swelling -will get x-ray to evaluate -will check uric acid -potential gout -PRN analgesics   Oncology (Dr. Benay Spice) Cardiology (Dr. Cathie Olden)  Code Status: Full DVT Prophylaxis:SCD's Family Communication: wife and daughter in law at bedside Disposition Plan: stepdown, inpatient, LOS > 2 midnights   Time spent: 75 minutes  [place], [facility] Pager [phone]

## 2015-07-14 DIAGNOSIS — E43 Unspecified severe protein-calorie malnutrition: Secondary | ICD-10-CM

## 2015-07-14 DIAGNOSIS — A419 Sepsis, unspecified organism: Secondary | ICD-10-CM | POA: Insufficient documentation

## 2015-07-14 LAB — BASIC METABOLIC PANEL
Anion gap: 9 (ref 5–15)
BUN: 32 mg/dL — AB (ref 6–20)
CHLORIDE: 96 mmol/L — AB (ref 101–111)
CO2: 24 mmol/L (ref 22–32)
Calcium: 7.7 mg/dL — ABNORMAL LOW (ref 8.9–10.3)
Creatinine, Ser: 1.02 mg/dL (ref 0.61–1.24)
Glucose, Bld: 115 mg/dL — ABNORMAL HIGH (ref 65–99)
Potassium: 3.9 mmol/L (ref 3.5–5.1)
SODIUM: 129 mmol/L — AB (ref 135–145)

## 2015-07-14 LAB — CBC
HEMATOCRIT: 26.7 % — AB (ref 39.0–52.0)
Hemoglobin: 8.9 g/dL — ABNORMAL LOW (ref 13.0–17.0)
MCH: 29.6 pg (ref 26.0–34.0)
MCHC: 33.3 g/dL (ref 30.0–36.0)
MCV: 88.7 fL (ref 78.0–100.0)
PLATELETS: 273 10*3/uL (ref 150–400)
RBC: 3.01 MIL/uL — AB (ref 4.22–5.81)
RDW: 20 % — AB (ref 11.5–15.5)
WBC: 20.1 10*3/uL — AB (ref 4.0–10.5)

## 2015-07-14 LAB — COMPREHENSIVE METABOLIC PANEL
ALBUMIN: 1.7 g/dL — AB (ref 3.5–5.0)
ALK PHOS: 133 U/L — AB (ref 38–126)
ALT: 44 U/L (ref 17–63)
AST: 73 U/L — AB (ref 15–41)
Anion gap: 9 (ref 5–15)
BILIRUBIN TOTAL: 1.3 mg/dL — AB (ref 0.3–1.2)
BUN: 35 mg/dL — AB (ref 6–20)
CO2: 23 mmol/L (ref 22–32)
CREATININE: 1.04 mg/dL (ref 0.61–1.24)
Calcium: 7.9 mg/dL — ABNORMAL LOW (ref 8.9–10.3)
Chloride: 98 mmol/L — ABNORMAL LOW (ref 101–111)
GFR calc Af Amer: >60 mL/min (ref >60)
GFR calc non Af Amer: >60 mL/min (ref >60)
GLUCOSE: 151 mg/dL — AB (ref 65–99)
POTASSIUM: 3.8 mmol/L (ref 3.5–5.1)
Sodium: 130 mmol/L — ABNORMAL LOW (ref 135–145)
TOTAL PROTEIN: 5 g/dL — AB (ref 6.5–8.1)

## 2015-07-14 LAB — TROPONIN I: Troponin I: <0.03 ng/mL (ref <0.031)

## 2015-07-14 LAB — STREP PNEUMONIAE URINARY ANTIGEN: Strep Pneumo Urinary Antigen: NEGATIVE

## 2015-07-14 MED ORDER — CETYLPYRIDINIUM CHLORIDE 0.05 % MT LIQD
7.0000 mL | Freq: Two times a day (BID) | OROMUCOSAL | Status: DC
  Administered 2015-07-14 – 2015-07-21 (×15): 7 mL via OROMUCOSAL

## 2015-07-14 NOTE — Care Management Note (Signed)
Case Management Note  Patient Details  Name: Jack Schultz MRN: [id number] Date of Birth: Sep 21, 1924  Subjective/Objective:            A.fib with rvr and sirs        Action/Plan: will follow up for needs and discharge planning.   Expected Discharge Date:   (unknown)               Expected Discharge Plan:  Home/Self Care  In-House Referral:  NA  Discharge planning Services  CM Consult  Post Acute Care Choice:  NA Choice offered to:  NA  DME Arranged:    DME Agency:     HH Arranged:    HH Agency:     Status of Service:  In process, will continue to follow  Medicare Important Message Given:    Date Medicare IM Given:    Medicare IM give by:    Date Additional Medicare IM Given:    Additional Medicare Important Message give by:     If discussed at [place] of Stay Meetings, dates discussed:    Additional Comments:  Leeroy Cha, RN 07/14/2015, 10:37 AM

## 2015-07-14 NOTE — Progress Notes (Signed)
Initial Nutrition Assessment  DOCUMENTATION CODES:   Severe malnutrition in context of chronic illness  INTERVENTION:  Ensure Enlive po BID, each supplement provides 350 kcal and 20 grams of protein  Encourage PO intake  NUTRITION DIAGNOSIS:   Inadequate oral intake related to poor appetite as evidenced by per patient/family report, percent weight loss.  GOAL:   Patient will meet greater than or equal to 90% of their needs  MONITOR:   PO intake, Supplement acceptance, Labs, I & O's  REASON FOR ASSESSMENT:   Consult Assessment of nutrition requirement/status  ASSESSMENT:   Jack Schultz is a 81 y.o. male with PMH significant for HTN, HLD, Atrial fibrillation, essential thrombocytosis, GERD, presumed myelodysplastic syndrome, hypothyroidism and absolute anemia; who came as direct admission from oncology office due to generalized malaise, palpitations, SOB and FTT.   Pt was in a great deal of pain when I visited and is also hard of hearing, possibly lethargic/confused.  Chart review showed 22#/11% severe wt loss in 10 months.  Spoke to patient's Son, said father had not been eating much in the past 3-4 weeks due to medical issues.  -Attempted NFPA but pt was in a great deal of pain, along with Acute-on-Chronic related Edema. Based on wt loss and energy intake, exhibits malnutrition related to chronic illness (myelodysplastic syndrome).  Pt loves Ensure at home, will provide to help meet nutrition needs during stay.  Diet Order:  Diet Heart Room service appropriate?: Yes; Fluid consistency:: Thin  Skin:  Reviewed, no issues  Last BM:  PTA  Height:   Ht Readings from Last 1 Encounters:  07/10/2015 5\' 11"  (1.803 m)    Weight:   Wt Readings from Last 1 Encounters:  07/14/15 163 lb 9.3 oz (74.2 kg)    Ideal Body Weight:  78.18 kg  BMI:  Body mass index is 22.83 kg/(m^2).  Estimated Nutritional Needs:   Kcal:  2200-2400 calories  Protein:  90-110  grams  Fluid:  Per doctor's reccomendation.  EDUCATION NEEDS:   No education needs identified at this time  Satira Anis. Emmilia Sowder, MS, RD LDN After Hours/Weekend Pager [phone]

## 2015-07-14 NOTE — Evaluation (Signed)
Occupational Therapy Evaluation Patient Details Name: Jack Schultz MRN: [id number] DOB: 1924-05-27 Today's Date: 07/14/2015    History of Present Illness This 81 year old man was admitted directly from oncology office with SOB, generalize weakness.  Dx'd with A-Fib with RVR.  PMH significant for presumed myodysplastic syndrome and HTN   Clinical Impression   This 81 year old man was admitted for the above.  Initial evaluation performed from bed level.  Pt needed assistance with ADLs prior to admission:  Amount of assistance unknown, but he was self-feeding using AE.  Goals in acute will focus on this, sitting tolerance and standing tolerance to decrease burden of care during adls.    Follow Up Recommendations  SNF    Equipment Recommendations   (to be further assessed)    Recommendations for Other Services       Precautions / Restrictions Precautions Precautions: Fall Precaution Comments: bil shoulders sore; L greater than R Restrictions Weight Bearing Restrictions: No      Mobility Bed Mobility               General bed mobility comments: NT  Transfers                 General transfer comment: NT    Balance                                            ADL Overall ADL's : Needs assistance/impaired Eating/Feeding: Moderate assistance;Bed level   Grooming: Wash/dry hands;Wash/dry face;Maximal assistance;Bed level                                 General ADL Comments: provided lightweight lidded mug for beverages and pt was able to drink "Ensure".  Talked to RN and told that he could also use this for soup.  Provided built up foam for utensils, but pt doesn't have an appetite and will have to further evaluate support/positioning for food.  Pt needs total A for ADLs at this time due to limited ROM/strength and pain in bil UEs.  Did not assess mobility on this visit.Marland Kitchen  Pt with 2/4 dyspnea.  Resting HR 115     Vision     Perception     Praxis      Pertinent Vitals/Pain Pain Assessment: Faces Faces Pain Scale: Hurts whole lot Pain Location: RUE with shoulder movement Pain Intervention(s): Limited activity within patient's tolerance;Monitored during session     Hand Dominance Right   Extremity/Trunk Assessment Upper Extremity Assessment Upper Extremity Assessment: RUE deficits/detail;LUE deficits/detail RUE Deficits / Details: only able to move R shoulder approximately 20 degrees pain free--grimaced when lifting during evaluation.  Has difficulty with grip LUE Deficits / Details: Did not move L shoulder due to pain.  able to move elbow wrist and hand, but did not use functionally during evaluation.  Offered pillow under arm, but pt was comfortable as he was           Communication Communication Communication: No difficulties   Cognition Arousal/Alertness: Awake/alert Behavior During Therapy: WFL for tasks assessed/performed Overall Cognitive Status: Within Functional Limits for tasks assessed                     General Comments       Exercises  Shoulder Instructions      Home Living Family/patient expects to be discharged to:: Skilled nursing facility Living Arrangements: Spouse/significant other;Children;Other relatives                                      Prior Functioning/Environment Level of Independence: Needs assistance             OT Diagnosis: Acute pain;Generalized weakness   OT Problem List: Decreased strength;Decreased range of motion;Decreased activity tolerance;Pain;Impaired UE functional use;Cardiopulmonary status limiting activity   OT Treatment/Interventions: Self-care/ADL training;DME and/or AE instruction;Patient/family education;Therapeutic activities (balance NT)    OT Goals(Current goals can be found in the care plan section) Acute Rehab OT Goals Patient Stated Goal: non stated OT Goal Formulation: With patient Time For  Goal Achievement: 07/28/15 Potential to Achieve Goals: Fair ADL Goals Pt Will Perform Eating: with min assist;with adaptive utensils;sitting;bed level (with adaptive positioning as needed) Additional ADL Goal #1: pt will sit unsupported EOB for 5 minutes with min guard in preparation for adls, once assisted to EOB with +2 assist Additional ADL Goal #2: pt will go from sit to stand with max A +2 for safety and maintain for 1 minute with mod A for adls  OT Frequency: Min 2X/week   Barriers to D/C:            Co-evaluation              End of Session    Activity Tolerance: Patient limited by pain Patient left: in bed;with call bell/phone within reach;with family/visitor present   Time: 4128-7867 OT Time Calculation (min): 12 min Charges:  OT General Charges $OT Visit: 1 Procedure OT Evaluation $Initial OT Evaluation Tier I: 1 Procedure G-Codes:    Darlene Brozowski 2015/08/04, 1:25 PM   Lesle Chris, OTR/L [phone] August 04, 2015

## 2015-07-14 NOTE — Clinical Social Work Note (Signed)
Clinical Social Work Assessment  Patient Details  Name: Jack Schultz MRN: [id number] Date of Birth: Jun 07, 1924  Date of referral:  07/14/15               Reason for consult:  Discharge Planning, Facility Placement                Permission sought to share information with:  Facility Art therapist granted to share information::  Yes, Verbal Permission Granted  Name::        Agency::     Relationship::     Contact Information:     Housing/Transportation Living arrangements for the past 2 months:  [place], [place] of Information:  Adult Children Patient Interpreter Needed:  None Criminal Activity/Legal Involvement Pertinent to Current Situation/Hospitalization:  No - Comment as needed Significant Relationships:  Adult Children, Spouse Lives with:  Facility Resident, Spouse Do you feel safe going back to the place where you live?  Yes Need for family participation in patient care:  Yes (Comment)  Care giving concerns: Pt does not have a HCPOA in place at this time.   Social Worker assessment / plan: Pt hospitalized on 06/29/2015, from [facility] ( [facility] with multiple medical concerns including A-Fib with rapid ventricular response. Pt was recently dc from [facility] hospital to [place] for ST Rehab. CSW met with pt's daughter at bedside to assist with d/c planning. Pt sleeping at time of visit. Pt / family would like pt to return to [facility] at d/c. CSW has contacted SNF and clinicals provided. SNF will readmit pt if there is a bed available at d/c. Pt is not holding bed at SNF. CSW will continue to follow to assist with d/c planning.  Employment status:  Retired Forensic scientist:  Medicare PT Recommendations:  Not assessed at this time [place] / Referral to community resources:  [place]  Patient/Family's Response to care:  Family would like pt to return to [facility] to continue with rehab.  Patient/Family's  Understanding of and Emotional Response to Diagnosis, Current Treatment, and Prognosis:  Family is aware of pt's medical status. Daughter is considering assisting pt with  HCPOA form during this hospitalization.  Emotional Assessment Appearance:  Appears stated age Attitude/Demeanor/Rapport:  Unable to Assess Affect (typically observed):  Unable to Assess Orientation:  Oriented to Self, Oriented to Place (Daughter reports that orientation flucuates r/t medical status.) Alcohol / Substance use:  Not Applicable Psych involvement (Current and /or in the community):  No (Comment)  Discharge Needs  Concerns to be addressed:  Discharge Planning Concerns Readmission within the last 30 days:  Yes Current discharge risk:  None Barriers to Discharge:  No Barriers Identified   Loraine Maple [phone] 07/14/2015, 12:47 PM

## 2015-07-14 NOTE — Progress Notes (Signed)
PROGRESS NOTE  Jack Schultz JZP:[id number] DOB: September 14, 1924 DOA: 06/30/2015 PCP: [name], MD  HPI/Recap of past 24 hours:  Still weak, but reported feeling better, daughter in room , help providing history.  Assessment/Plan: Principal Problem:   Atrial fibrillation with RVR (HCC) Active Problems:   Essential thrombocytosis (HCC)   Hypothyroidism   Absolute anemia   CKD (chronic kidney disease) stage 2, GFR 60-89 ml/min   Acute on chronic diastolic HF (heart failure) (HCC)   General weakness   GERD (gastroesophageal reflux disease)   Myelodysplastic syndrome (HCC)   Protein-calorie malnutrition, severe (HCC)   Decreased movement of arm   SOB (shortness of breath)   Leukocytosis   SIRS (systemic inflammatory response syndrome) (HCC)  Sob likely multifactorial including HACP and afib/ RVR with baseline anemia. Treating underline diseases.  HACP on vanc/zosyn, leukocytosis coming down.  Afib/RVR with h/o heart block during hospitalization a month ago. Currently on asa 81mg  and metoprolol, not a candidate for anticoagulation due to h/o myeloproliferative/? myeolodysplastic syndrome with h/o significant epistaxis. Cardiology input appreciated  Leukocytosis with sepsis, dehydration and underline myeloproliferative/myelodysplastic syndrome, oncology consulted by admitting MD.  FTT: with recent repeated admission to the hospital, likely will need SNF.  Code Status: full  Family Communication: patient and daughter   Disposition Plan: remain in stepdown   Consultants:  Cardiology Dr. Cathie Olden  Oncology Dr. Benay Spice  Procedures:  none  Antibiotics:  vanc/zosyn   Objective: BP 121/59 mmHg  Pulse 111  Temp(Src) 98.4 F (36.9 C) (Oral)  Resp 27  Ht 5\' 11"  (1.803 m)  Wt 163 lb 9.3 oz (74.2 kg)  BMI 22.83 kg/m2  SpO2 95%  Intake/Output Summary (Last 24 hours) at 07/14/15 1833 Last data filed at 07/14/15 1800  Gross per 24 hour  Intake 1996.25  ml  Output   1150 ml  Net 846.25 ml   Filed Weights   07/12/2015 2030 07/14/15 0443  Weight: 163 lb 2.3 oz (74 kg) 163 lb 9.3 oz (74.2 kg)    Exam:   General:  NAD  Cardiovascular: RRR  Respiratory: CTABL  Abdomen: Soft/ND/NT, positive BS  Musculoskeletal: No Edema  Neuro:   Data Reviewed: Basic Metabolic Panel:  Recent Labs Lab 07/05/2015 1833 07/14/15 0205  NA 130* 129*  K 3.9 3.9  CL 95* 96*  CO2 27 24  GLUCOSE 126* 115*  BUN 34* 32*  CREATININE 1.18 1.02  CALCIUM 8.0* 7.7*  MG 2.0  --    Liver Function Tests:  Recent Labs Lab 07/02/2015 1833  AST 41  ALT 33  ALKPHOS 130*  BILITOT 1.4*  PROT 5.7*  ALBUMIN 2.0*   No results for input(s): LIPASE, AMYLASE in the last 168 hours. No results for input(s): AMMONIA in the last 168 hours. CBC:  Recent Labs Lab 07/07/2015 1357 07/14/15 0205  WBC 29.6* 20.1*  NEUTROABS 27.7*  --   HGB 7.9* 8.9*  HCT 25.3* 26.7*  MCV 90.2 88.7  PLT 405* 273   Cardiac Enzymes:    Recent Labs Lab 07/02/2015 1833 07/14/15 0205 07/14/15 0843  TROPONINI 0.03 <0.03 <0.03   BNP (last 3 results)  Recent Labs  06/06/15 0512 07/07/2015 1833  BNP 425.4* 470.8*    ProBNP (last 3 results) No results for input(s): PROBNP in the last 8760 hours.  CBG: No results for input(s): GLUCAP in the last 168 hours.  Recent Results (from the past 240 hour(s))  TECHNOLOGIST REVIEW     Status: None   Collection Time:  07/07/15 12:16 PM  Result Value Ref Range Status   Technologist Review 3%Metas and 3% Nrbcs, Rare Promyelocyte and Blast   Final  TECHNOLOGIST REVIEW     Status: None   Collection Time: 07/11/2015  1:57 PM  Result Value Ref Range Status   Technologist Review   Final    Few Metas and Myelocytes present, rare blast and nRBC, hypersegmented neutrophils  Culture, blood (routine x 2) Call MD if unable to obtain prior to antibiotics being given     Status: None (Preliminary result)   Collection Time: 07/08/2015  7:52 PM    Result Value Ref Range Status   Specimen Description BLOOD RIGHT HAND  Final   Special Requests   Final    AEROBIC BOTTLE ONLY 5ML Performed at [facility]    Culture PENDING  Incomplete   Report Status PENDING  Incomplete     Studies: No results found.  Scheduled Meds: . sodium chloride  250 mL Intravenous Once  . aspirin  81 mg Oral Daily  . feeding supplement (ENSURE ENLIVE)  237 mL Oral TID BM  . ferrous sulfate  325 mg Oral Q breakfast  . levothyroxine  50 mcg Oral QAC breakfast  . metoprolol  5 mg Intravenous Q8H  . piperacillin-tazobactam (ZOSYN)  IV  3.375 g Intravenous Q8H  . potassium chloride SA  20 mEq Oral Daily  . sodium chloride  3 mL Intravenous Q12H  . vancomycin  1,000 mg Intravenous Q24H    Continuous Infusions: . sodium chloride 75 mL/hr at 07/14/15 1400     Time spent: 16mins  Yvonne Stopher MD, PhD  [facility] Pager [phone]. If 7PM-7AM, please contact night-coverage at [email or web address], password [email or web address] 07/14/2015, 6:33 PM  LOS: 1 day

## 2015-07-14 NOTE — Progress Notes (Signed)
PT Cancellation Note  Patient Details Name: Jack Schultz MRN: [id number] DOB: 1924/04/15   Cancelled Treatment:    Reason Eval/Treat Not Completed: Other (comment) (nursing assisted  patient OOB and now back in bed. will return this PM for mobility evaluation.)   [name] 07/14/2015, 11:54 AM Tresa Endo PT [phone]

## 2015-07-14 NOTE — Progress Notes (Signed)
Patient profile: Patient is a 81 y.o. male with a PMHx of myelodysplastic syndrome, HTN, HL , who was admitted to [facility] on 07/08/2015 for evaluation of with generalized weakness and palpitations. He is found have a white count of 29,000. He was also found to have rapid atrial fibrillation.  He's had H fibrillation before. He is been thought to be a very poor candidate for anticoagulation given his high risk of bleeding and his myeloproliferative disorder.  He had 2 echocardiograms last month- both of which show normal left ventricle systolic function.  Subjective: Tired of sitting up in chair. No pain, a little SOB  Objective: Vital signs in last 24 hours: Temp:  [97.6 F (36.4 C)-98.1 F (36.7 C)] 98 F (36.7 C) (10/21 0400) Pulse Rate:  [101-134] 101 (10/21 0400) Resp:  [16-33] 21 (10/21 0400) BP: (100-118)/(48-62) 106/54 mmHg (10/21 0400) SpO2:  [89 %-99 %] 95 % (10/21 0400) Weight:  [163 lb 2.3 oz (74 kg)-163 lb 9.3 oz (74.2 kg)] 163 lb 9.3 oz (74.2 kg) (10/21 0443) Weight change:    Intake/Output from previous day: +556 10/20 0701 - 10/21 0700 In: 1206.3 [P.O.:60; I.V.:616.3; Blood:330; IV Piggyback:200] Out: 650 [Urine:650] Intake/Output this shift:    PE: General:Pleasant affect, NAD, + fatigue Skin:Warm and dry, brisk capillary refill HEENT:normocephalic, sclera clear, mucus membranes moist Heart:irreg irreg  without murmur, gallup, rub or click Lungs: with few crackles throughout.  no rhonchi, or wheezes BPZ:WCHE, non tender, + BS, do not palpate liver spleen or masses Ext:no lower ext edema, 2+ pedal pulses, 2+ radial pulses Neuro:alert and oriented X 3, MAE, follows commands, + facial symmetry Tele:  A fib rate up now but time for BB.   Lab Results:  Recent Labs  07/03/2015 1357 07/14/15 0205  WBC 29.6* 20.1*  HGB 7.9* 8.9*  HCT 25.3* 26.7*  PLT 405* 273   BMET  Recent Labs  07/01/2015 1833 07/14/15 0205  NA 130* 129*  K 3.9 3.9  CL 95* 96*    CO2 27 24  GLUCOSE 126* 115*  BUN 34* 32*  CREATININE 1.18 1.02  CALCIUM 8.0* 7.7*    Recent Labs  07/11/2015 1833 07/14/15 0205  TROPONINI 0.03 <0.03    Lab Results  Component Value Date   CHOL 125 01/10/2015   HDL 23* 01/10/2015   LDLCALC 74 01/10/2015   TRIG 138 01/10/2015   CHOLHDL 5.4 01/10/2015   Lab Results  Component Value Date   HGBA1C 5.2 01/10/2015     Lab Results  Component Value Date   TSH 2.048 07/03/2015    Hepatic Function Panel  Recent Labs  06/29/2015 1833  PROT 5.7*  ALBUMIN 2.0*  AST 41  ALT 33  ALKPHOS 130*  BILITOT 1.4*   No results for input(s): CHOL in the last 72 hours. No results for input(s): PROTIME in the last 72 hours.     Studies/Results: Dg Chest 2 View  07/07/2015  CLINICAL DATA:  Weakness, shortness of breath, hypertension, former smoker EXAM: CHEST  2 VIEW COMPARISON:  06/11/2015 FINDINGS: Enlargement of cardiac silhouette. Atherosclerotic calcification mild tortuosity of thoracic aorta. Mediastinal contours and pulmonary vascularity otherwise normal. Bibasilar infiltrates, improved on LEFT. Decreased basilar effusions. Underlying emphysematous changes. Upper lungs clear. No pneumothorax. Advanced BILATERAL glenohumeral degenerative changes and chronic rotator cuff tears. Osseous demineralization. IMPRESSION: Emphysematous changes with bibasilar infiltrates, improved on LEFT. Decreased bibasilar effusions and atelectasis Electronically Signed   By: Lavonia Dana M.D.   On:  06/29/2015 18:45   Dg Elbow 2 Views Left  07/19/2015  CLINICAL DATA:  Pain, swelling and limited range of motion of the left elbow. EXAM: LEFT ELBOW - 2 VIEW COMPARISON:  None. FINDINGS: Mild degenerative changes but no acute fracture or osteochondral abnormality. No joint effusion. No findings to suggest olecranon bursitis. IMPRESSION: Degenerative changes but no acute bony findings for joint effusion. Electronically Signed   By: Marijo Sanes M.D.   On:  06/27/2015 18:47    Medications: I have reviewed the patient's current medications. Scheduled Meds: . sodium chloride  250 mL Intravenous Once  . aspirin  81 mg Oral Daily  . feeding supplement (ENSURE ENLIVE)  237 mL Oral TID BM  . ferrous sulfate  325 mg Oral Q breakfast  . levothyroxine  50 mcg Oral QAC breakfast  . metoprolol  5 mg Intravenous Q8H  . piperacillin-tazobactam (ZOSYN)  IV  3.375 g Intravenous Q8H  . potassium chloride SA  20 mEq Oral Daily  . sodium chloride  3 mL Intravenous Q12H  . vancomycin  1,000 mg Intravenous Q24H   Continuous Infusions: . sodium chloride 75 mL/hr at 06/25/2015 1947   PRN Meds:.acetaminophen **OR** acetaminophen, heparin lock flush, heparin lock flush, levalbuterol, morphine injection, ondansetron **OR** ondansetron (ZOFRAN) IV, oxyCODONE, polyethylene glycol, sodium chloride, sodium chloride  Assessment/Plan: Principal Problem:   Atrial fibrillation with RVR (HCC) Active Problems:   Essential thrombocytosis (HCC)   Hypothyroidism   Absolute anemia   CKD (chronic kidney disease) stage 2, GFR 60-89 ml/min   Acute on chronic diastolic HF (heart failure) (HCC)   General weakness   GERD (gastroesophageal reflux disease)   Myelodysplastic syndrome (HCC)   Protein-calorie malnutrition, severe (HCC)   Decreased movement of arm   SOB (shortness of breath)   Leukocytosis   SIRS (systemic inflammatory response syndrome) ([place])  1. Atrial fibrillation with rapid ventricular response. The patient presents with symptoms of reduced appetite, generalized malaise, fatigue. He also has some palpitations. He was found have a white blood cell count of 29,000. Now down to 20,100.  His found have atrial fibrillation.  Per Dr. Acie Fredrickson "  I think that his atrial fibrillation is secondary to his underlying medical illness which at this point is not clearly defined."  Felt to be volume  depleted and would tolerate a diltiazem drip and I do think that needs to  receive some IV fluids for better hydration.  He is on IV lopressor which is helping to control rate.   He is a poor candidate for anticoagulation given his myeloproliferative disorder .  Tropnins negative    Echo 06/08/15 The  LVF, EF 65-70%, normal wall motion, aortic sclerosis, mild AI, trivial MR, mild LAE (37), mild RAE, mild TR, PASP 37 mmHg, trivial effusion  Echo 06/01/15 EF 55%, mild AI, mild MR, mild LAE, mild RVE, PASP 50 mmHg, trivial effusion   2. Leukocytosis: Further plans per Int. Med and Oncology .   3.  Borderline BP 409 to 90 systolic.   4. Hx CHB in Sept 2016 with stopping of BB and then pt in a fib.  Monitor HR   5. Recent hx of pericarditis.  Could not treat with NSAIDS or Colchicine due to elevated Cr.  On repeat echo only trivial post. Effusion .   LOS: 1 day   Time spent with pt. :15 minutes. [facility] R  Nurse Practitioner Certified Pager [phone] or after 5pm and on weekends call [phone] 07/14/2015, 7:50 AM   Attending  Note:   The patient was seen and examined.  Agree with assessment and plan as noted above.  Changes made to the above note as needed.  Pt's HR is still elevated, but is better.  He seems to be feeling better after the IV fluid bolus and the abx. He has been on atenolol in the recent past but he developed complete heart block. I would continue to treat his underlying issues and the HR should improve.   Unfortunately , he has had a steady decline over the past month or so and his prognosis is poor.    Thayer Headings, Brooke Bonito., MD, [facility] 07/14/2015, 11:37 AM [address]  [place] Pager [phone]

## 2015-07-15 DIAGNOSIS — M79622 Pain in left upper arm: Secondary | ICD-10-CM

## 2015-07-15 DIAGNOSIS — Z515 Encounter for palliative care: Secondary | ICD-10-CM

## 2015-07-15 DIAGNOSIS — D649 Anemia, unspecified: Secondary | ICD-10-CM

## 2015-07-15 DIAGNOSIS — C946 Myelodysplastic disease, not classified: Secondary | ICD-10-CM

## 2015-07-15 DIAGNOSIS — R627 Adult failure to thrive: Secondary | ICD-10-CM | POA: Insufficient documentation

## 2015-07-15 DIAGNOSIS — M79621 Pain in right upper arm: Secondary | ICD-10-CM

## 2015-07-15 LAB — CBC
HEMATOCRIT: 22.8 % — AB (ref 39.0–52.0)
HEMOGLOBIN: 7.6 g/dL — AB (ref 13.0–17.0)
MCH: 29.9 pg (ref 26.0–34.0)
MCHC: 33.3 g/dL (ref 30.0–36.0)
MCV: 89.8 fL (ref 78.0–100.0)
Platelets: 278 10*3/uL (ref 150–400)
RBC: 2.54 MIL/uL — ABNORMAL LOW (ref 4.22–5.81)
RDW: 20.8 % — ABNORMAL HIGH (ref 11.5–15.5)
WBC: 19.7 10*3/uL — ABNORMAL HIGH (ref 4.0–10.5)

## 2015-07-15 LAB — COMPREHENSIVE METABOLIC PANEL
ALBUMIN: 1.7 g/dL — AB (ref 3.5–5.0)
ALK PHOS: 149 U/L — AB (ref 38–126)
ALT: 46 U/L (ref 17–63)
AST: 61 U/L — AB (ref 15–41)
Anion gap: 7 (ref 5–15)
BUN: 31 mg/dL — AB (ref 6–20)
CALCIUM: 7.8 mg/dL — AB (ref 8.9–10.3)
CO2: 25 mmol/L (ref 22–32)
CREATININE: 1.01 mg/dL (ref 0.61–1.24)
Chloride: 98 mmol/L — ABNORMAL LOW (ref 101–111)
GFR calc Af Amer: >60 mL/min (ref >60)
GFR calc non Af Amer: >60 mL/min (ref >60)
GLUCOSE: 101 mg/dL — AB (ref 65–99)
Potassium: 3.6 mmol/L (ref 3.5–5.1)
SODIUM: 130 mmol/L — AB (ref 135–145)
Total Bilirubin: 1.5 mg/dL — ABNORMAL HIGH (ref 0.3–1.2)
Total Protein: 5 g/dL — ABNORMAL LOW (ref 6.5–8.1)

## 2015-07-15 LAB — URINE CULTURE: Culture: NO GROWTH

## 2015-07-15 LAB — LEGIONELLA PNEUMOPHILA SEROGP 1 UR AG: L. PNEUMOPHILA SEROGP 1 UR AG: NEGATIVE

## 2015-07-15 LAB — PROCALCITONIN: PROCALCITONIN: 0.48 ng/mL

## 2015-07-15 LAB — PREPARE RBC (CROSSMATCH)

## 2015-07-15 MED ORDER — METOPROLOL TARTRATE 1 MG/ML IV SOLN
2.5000 mg | Freq: Once | INTRAVENOUS | Status: AC
  Administered 2015-07-15: 2.5 mg via INTRAVENOUS
  Filled 2015-07-15: qty 5

## 2015-07-15 MED ORDER — SODIUM CHLORIDE 0.9 % IV SOLN
Freq: Once | INTRAVENOUS | Status: AC
  Administered 2015-07-15: 12:00:00 via INTRAVENOUS

## 2015-07-15 NOTE — Evaluation (Signed)
Physical Therapy Evaluation Patient Details Name: Jack Schultz MRN: [id number] DOB: 1924/05/30 Today's Date: 07/15/2015   History of Present Illness  This 81 year old man was admitted directly from oncology office with SOB, generalize weakness.  Dx'd with A-Fib with RVR.  PMH significant for presumed myodysplastic syndrome and HTN, anemia, onset UE pain  Clinical Impression  Very  Limited evaluation due to severe pain complaints  Of both UE's.  Barely able to touch the arms. Focussed on repositioning in the bed for comfort. Will follow for  Recommendations from Palliative Care consult pending. Patient will benefit from PT if tolerated to address problems listed in the note below.    Follow Up Recommendations SNF;Supervision/Assistance - 24 hour    Equipment Recommendations  None recommended by PT    Recommendations for Other Services       Precautions / Restrictions Precautions Precautions: Fall Precaution Comments: bil shoulders sore; L greater than R Restrictions Weight Bearing Restrictions: No      Mobility  Bed Mobility               General bed mobility comments: NT, only able to reposition  Transfers                 General transfer comment: NT  Ambulation/Gait                Stairs            Wheelchair Mobility    Modified Rankin (Stroke Patients Only)       Balance                                             Pertinent Vitals/Pain Faces Pain Scale: Hurts worst Pain Location: both UE's  to try to even move. Pain Intervention(s): Limited activity within patient's tolerance    Home Living Family/patient expects to be discharged to:: Skilled nursing facility (Has Palliative care conasult.)                      Prior Function Level of Independence: Needs assistance               Hand Dominance        Extremity/Trunk Assessment   Upper Extremity Assessment: Defer to OT evaluation            Lower Extremity Assessment: LLE deficits/detail;RLE deficits/detail RLE Deficits / Details: grossly 3+ /5. limited  due to pain of UE's LLE Deficits / Details: same as R     Communication      Cognition Arousal/Alertness: Awake/alert Behavior During Therapy: Anxious Overall Cognitive Status: Difficult to assess                      General Comments      Exercises        Assessment/Plan    PT Assessment Patient needs continued PT services (will follow for Palliative  care recommendations.)  PT Diagnosis Acute pain   PT Problem List Decreased strength;Decreased activity tolerance;Decreased range of motion;Decreased mobility;Pain  PT Treatment Interventions Functional mobility training;Therapeutic activities;Therapeutic exercise   PT Goals (Current goals can be found in the Care Plan section) Acute Rehab PT Goals Patient Stated Goal: non stated PT Goal Formulation: With family Time For Goal Achievement: 07/29/15 Potential to Achieve Goals: Fair    Frequency Min  3X/week   Barriers to discharge        Co-evaluation               End of Session   Activity Tolerance: Patient limited by pain Patient left: in bed;with call bell/phone within reach;with bed alarm set Nurse Communication: Mobility status         Time: 5615-3794 PT Time Calculation (min) (ACUTE ONLY): 12 min   Charges:   PT Evaluation $Initial PT Evaluation Tier I: 1 Procedure     PT G CodesClaretha Cooper 07/15/2015, 11:49 AM Tresa Endo PT [phone]

## 2015-07-15 NOTE — Clinical Documentation Improvement (Signed)
Hospitalist  Abnormal Lab/Test Results:   Sodium: 10/20:  130. 10/21:  129. 10/22:  130.  Possible Clinical Conditions associated with below indicators  Hyponatremia  Other Condition  Cannot Clinically Determine   Treatment Provided: 10/22:  0.9% NaCl @ 75 ml/hr.   Please exercise your independent, professional judgment when responding. A specific answer is not anticipated or expected.   Thank You,  [place] [phone]

## 2015-07-15 NOTE — Progress Notes (Signed)
Candie Chroman, RN Registered Nurse Signed Nursing Progress Notes 07/15/2015 11:00 PM    Expand All Collapse All   Pt alert x3, HR in the 130's paged Janit Pagan NP order given for 2.5 of IV Lopressor. HR in the 90's Pt placed on 55 percent non rebreather. Will continue to monitor. Jeanie Sewer, RN 11:10 PM 07/15/2015

## 2015-07-15 NOTE — Consult Note (Signed)
Consultation Note Date: 07/15/2015   Patient Name: Jack Schultz  DOB: 06-Sep-1924  MRN: [id number]  Age / Sex: 81 y.o., male  PCP: Unk Pinto, MD Referring Physician: Florencia Reasons, MD  Reason for Consultation: Establishing goals of care    Clinical Assessment/Narrative:  KERIN CECCHI is a 81 y.o. male with PMH significant for HTN, HLD, Atrial fibrillation, essential thrombocytosis, GERD, presumed myelodysplastic syndrome, hypothyroidism and absolute anemia; who came as direct admission from oncology office due to generalized malaise, palpitations, SOB and FTT. Patient with recent admission due to PNA and atrial fibrillation. Was discharged to SNF and was initially doing ok; then his blood counts started to have drops on his Hgb and required 2 units of PRBC's. On the day of admission found to have elevated WBC's (up to 30,000), was on afib with RVR and Hgb of 7.9. Patient SOB and feeling very weak.   The patient has been admitted to the hospitalist service with a diagnosis of atrial fibrillation with rapid ventricular response, failure to thrive. The patient has underlying myelodysplastic syndrome. The patient essentially is not progressing through this hospitalization, continues to be weak, continues to complain of pain, declining oral intake. Palliative consultation requested.  The patient is resting in bed. He is awake alert. He is a little hard of hearing. His daughter-in-law is present at the bedside. Introductions made. Patient and daughter-in-law given information about palliative care/supportive care services. Discussed about having a family meeting with all of the patient's loved ones involved. I have given my card to the patient's daughter-in-law. She states that the patient's daughter Jeannene Patella who works as an elder Training and development officer would like to be present for the family meeting.  Awaiting call back from patient's  family about coordinating a family meeting. Agree that the patient is appropriate for consideration for a more palliative approach to his care given his current hospitalization, given recent subacute decline, falls, myeloproliferative disorder, blasts, atrial fibrillation.  Palliative services will follow along. Further recommendations will follow based on the outcome of the family meeting  Contacts/Participants in Discussion: Patient daughter-in-law Primary Decision Maker: Patient then his daughter Pam Relationship to Patient daughter HCPOA: no  Unknown status of actual document  SUMMARY OF RECOMMENDATIONS  Family meeting with all concerned parties for discussion of CODE STATUS, goals of care, further decision making.  Code Status/Advance Care Planning: Full code    Code Status Orders        Start     Ordered   07/12/2015 1727  Full code   Continuous     07/15/2015 1743      Other Directives:Other  Symptom Management:      Palliative Prophylaxis:   Bowel Regimen  Additional Recommendations (Limitations, Scope, Preferences):  Await further discussions  Psycho-social/Spiritual:  Support System: Strong Desire for further Chaplaincy support:no Additional Recommendations: Caregiving  Support/Resources  Prognosis: < 6 months  Discharge Planning: Pending clinical course, further discussions   Chief Complaint/ Primary Diagnoses: Present on Admission:  . Absolute anemia . Atrial fibrillation with RVR ([place]) . Essential thrombocytosis ([place]) . Hypothyroidism . CKD (chronic kidney disease) stage 2, GFR 60-89 ml/min . Acute on chronic diastolic HF (heart failure) ([place]) . GERD (gastroesophageal reflux disease) . Myelodysplastic syndrome ([place]) . Protein-calorie malnutrition, severe ([place])  I have reviewed the medical record, interviewed the patient and family, and examined the patient. The following aspects are pertinent.  Past Medical History  Diagnosis Date  .  Hypertension   . Hyperlipidemia   . Pre-diabetes   .  Thyroid disease   . Thrombocytosis ([place])   . GERD (gastroesophageal reflux disease)   . Vitamin D deficiency   . Vision abnormalities   . Neuropathy ([place])   . Syncope and collapse    Social History   Social History  . Marital Status: Married    Spouse Name: N/A  . Number of Children: N/A  . Years of Education: N/A   Social History Main Topics  . Smoking status: Former Smoker    Types: Cigarettes    Quit date: 09/24/1975  . Smokeless tobacco: Former Systems developer    Types: Snuff    Quit date: 05/31/2015  . Alcohol Use: No  . Drug Use: No  . Sexual Activity: No   Other Topics Concern  . None   Social History Narrative   Family History  Problem Relation Age of Onset  . Hypertension Father   . Heart disease Father    Scheduled Meds: . sodium chloride  250 mL Intravenous Once  . antiseptic oral rinse  7 mL Mouth Rinse BID  . aspirin  81 mg Oral Daily  . feeding supplement (ENSURE ENLIVE)  237 mL Oral TID BM  . ferrous sulfate  325 mg Oral Q breakfast  . levothyroxine  50 mcg Oral QAC breakfast  . metoprolol  5 mg Intravenous Q8H  . piperacillin-tazobactam (ZOSYN)  IV  3.375 g Intravenous Q8H  . potassium chloride SA  20 mEq Oral Daily  . sodium chloride  3 mL Intravenous Q12H  . vancomycin  1,000 mg Intravenous Q24H   Continuous Infusions: . sodium chloride 75 mL/hr at 07/15/15 1200   PRN Meds:.acetaminophen **OR** acetaminophen, heparin lock flush, heparin lock flush, levalbuterol, morphine injection, ondansetron **OR** ondansetron (ZOFRAN) IV, oxyCODONE, polyethylene glycol, sodium chloride, sodium chloride Medications Prior to Admission:  Prior to Admission medications   Medication Sig Start Date End Date Taking? Authorizing Provider  acetaminophen (TYLENOL) 500 MG tablet Take 500 mg by mouth every 6 (six) hours as needed for headache.   Yes Historical Provider, MD  aspirin 81 MG tablet Take 81 mg by mouth daily.    Yes Historical Provider, MD  cefdinir (OMNICEF) 300 MG capsule Take 300 mg by mouth at bedtime. ABT Start Date 07/07/15 & End Date 07/24/2015.   Yes Historical Provider, MD  Cholecalciferol (VITAMIN D PO) Take 5,000 Units by mouth daily.   Yes Historical Provider, MD  feeding supplement, ENSURE ENLIVE, (ENSURE ENLIVE) LIQD Take 237 mLs by mouth 2 (two) times daily between meals. Patient taking differently: Take 237 mLs by mouth 4 (four) times daily.  06/13/15  Yes Belkys A Regalado, MD  ferrous sulfate 325 (65 FE) MG tablet Take 325 mg by mouth daily with breakfast.   Yes Historical Provider, MD  furosemide (LASIX) 40 MG tablet Take 40 mg by mouth daily.   Yes Historical Provider, MD  hydroxyurea (HYDREA) 500 MG capsule Take 500 mg by mouth daily.   Yes Historical Provider, MD  levothyroxine (SYNTHROID, LEVOTHROID) 50 MCG tablet Take 50 mcg by mouth daily.   Yes Historical Provider, MD  metoprolol tartrate (LOPRESSOR) 25 MG tablet Take 0.5 tablets (12.5 mg total) by mouth 2 (two) times daily. Patient taking differently: Take 25 mg by mouth 2 (two) times daily.  06/23/15  Yes Liliane Shi, PA-C  potassium chloride SA (K-DUR,KLOR-CON) 20 MEQ tablet Take 1 tablet (20 mEq total) by mouth daily. 06/13/15  Yes Belkys A Regalado, MD  acetaminophen (TYLENOL) 650 MG suppository Place 1 suppository (650  mg total) rectally every 6 (six) hours as needed for mild pain (or Fever >/= 101). 06/13/15   Belkys A Regalado, MD  polyethylene glycol (MIRALAX / GLYCOLAX) packet Take 17 g by mouth daily as needed for mild constipation. 06/13/15   Elmarie Shiley, MD   Allergies  Allergen Reactions  . Ace Inhibitors Cough   CBC:    Component Value Date/Time   WBC 19.7* 07/15/2015 0342   WBC 29.6* 07/18/2015 1357   HGB 7.6* 07/15/2015 0342   HGB 7.9* 07/08/2015 1357   HCT 22.8* 07/15/2015 0342   HCT 25.3* 06/25/2015 1357   PLT 278 07/15/2015 0342   PLT 405* 07/07/2015 1357   MCV 89.8 07/15/2015 0342   MCV 90.2  07/02/2015 1357   NEUTROABS 27.7* 07/12/2015 1357   NEUTROABS 35.5* 06/08/2015 0811   LYMPHSABS 0.2* 06/24/2015 1357   LYMPHSABS 0.7 06/08/2015 0811   MONOABS 0.1 07/21/2015 1357   MONOABS 0.7 06/08/2015 0811   EOSABS 1.5* 06/30/2015 1357   EOSABS 0.0 06/08/2015 0811   BASOSABS 0.1 06/26/2015 1357   BASOSABS 0.0 06/08/2015 0811   Comprehensive Metabolic Panel:    Component Value Date/Time   NA 130* 07/15/2015 0342   NA 133* 07/07/2015 1216   K 3.6 07/15/2015 0342   K 4.1 07/07/2015 1216   CL 98* 07/15/2015 0342   CO2 25 07/15/2015 0342   CO2 25 07/07/2015 1216   BUN 31* 07/15/2015 0342   BUN 27.1* 07/07/2015 1216   CREATININE 1.01 07/15/2015 0342   CREATININE 1.0 07/07/2015 1216   CREATININE 1.31 01/10/2015 1702   GLUCOSE 101* 07/15/2015 0342   GLUCOSE 112 07/07/2015 1216   CALCIUM 7.8* 07/15/2015 0342   CALCIUM 8.3* 07/07/2015 1216   AST 61* 07/15/2015 0342   AST 21 10/20/2014 1032   ALT 46 07/15/2015 0342   ALT 17 10/20/2014 1032   ALKPHOS 149* 07/15/2015 0342   ALKPHOS 60 10/20/2014 1032   BILITOT 1.5* 07/15/2015 0342   BILITOT 0.75 10/20/2014 1032   PROT 5.0* 07/15/2015 0342   PROT 6.4 10/20/2014 1032   ALBUMIN 1.7* 07/15/2015 0342   ALBUMIN 4.0 10/20/2014 1032   palliative performance scale 30%  Review of Systems Patient complains of pain in his shoulders  Physical Exam Patient appears with weakness and generalized discomfort Pale Shallow anterior breathing S1-S2 is regular Abdomen soft Awake alert nonfocal Vital Signs: BP 105/50 mmHg  Pulse 100  Temp(Src) 97.1 F (36.2 C) (Oral)  Resp 24  Ht 5\' 11"  (1.803 m)  Wt 74.2 kg (163 lb 9.3 oz)  BMI 22.83 kg/m2  SpO2 94% SpO2:    O2 Device:SpO2: 94 % O2 Flow Rate: .O2 Flow Rate (L/min): 2 L/min Intake/output summary:  Intake/Output Summary (Last 24 hours) at 07/15/15 1520 Last data filed at 07/15/15 1448  Gross per 24 hour  Intake   2354 ml  Output    450 ml  Net   1904 ml   LBM:  BMP Latest  Ref Rng 07/15/2015 07/14/2015 07/14/2015  Glucose 65 - 99 mg/dL 101(H) 151(H) 115(H)  BUN 6 - 20 mg/dL 31(H) 35(H) 32(H)  Creatinine 0.61 - 1.24 mg/dL 1.01 1.04 1.02  Sodium 135 - 145 mmol/L 130(L) 130(L) 129(L)  Potassium 3.5 - 5.1 mmol/L 3.6 3.8 3.9  Chloride 101 - 111 mmol/L 98(L) 98(L) 96(L)  CO2 22 - 32 mmol/L 25 23 24   Calcium 8.9 - 10.3 mg/dL 7.8(L) 7.9(L) 7.7(L)    Baseline Weight: Weight: 74 kg (163 lb 2.3 oz)  Most recent weight: Weight: 74.2 kg (163 lb 9.3 oz)      Palliative Assessment/Data:    Additional Data Reviewed: Recent Labs     07/14/15  0205  07/14/15  1925  07/15/15  0342  WBC  20.1*   --   19.7*  HGB  8.9*   --   7.6*  PLT  273   --   278  NA  129*  130*  130*  BUN  32*  35*  31*  CREATININE  1.02  1.04  1.01    Time In: 1000 Time Out: 1100 Time Total: 60 min  Greater than 50%  of this time was spent counseling and coordinating care related to the above assessment and plan.  Signed by: Loistine Chance, MD [id number] Loistine Chance, MD  07/15/2015, 3:20 PM  Please contact Palliative Medicine Team phone at [phone] for questions and concerns.

## 2015-07-15 NOTE — Progress Notes (Signed)
IP PROGRESS NOTE  Subjective:   He continues to have weakness and failure to thrive. He complains of pain in the arms and shoulders bilaterally. His daughter in laws at the bedside.  Objective: Vital signs in last 24 hours: Blood pressure 119/53, pulse 114, temperature 97.6 F (36.4 C), temperature source Oral, resp. rate 26, height _0  (1.803 m), weight 163 lb 9.3 oz (74.2 kg), SpO2 92 %.  Intake/Output from previous day: 10/21 0701 - 10/22 0700 In: 2025 [I.V.:1725; IV Piggyback:300] Out: 500 [Urine:500]  Physical Exam:  HEENT: No thrush Lungs: Clear anteriorly Cardiac: Irregular Abdomen: No hepatosplenomegaly, nontender Extremities: No leg edema Musculoskeletal:  Portacath/PICC-without erythema  Lab Results:  Recent Labs  07/14/15 0205 07/15/15 0342  WBC 20.1* 19.7*  HGB 8.9* 7.6*  HCT 26.7* 22.8*  PLT 273 278    BMET  Recent Labs  07/14/15 1925 07/15/15 0342  NA 130* 130*  K 3.8 3.6  CL 98* 98*  CO2 23 25  GLUCOSE 151* 101*  BUN 35* 31*  CREATININE 1.04 1.01  CALCIUM 7.9* 7.8*    Studies/Results: Dg Chest 2 View  07/06/2015  CLINICAL DATA:  Weakness, shortness of breath, hypertension, former smoker EXAM: CHEST  2 VIEW COMPARISON:  06/11/2015 FINDINGS: Enlargement of cardiac silhouette. Atherosclerotic calcification mild tortuosity of thoracic aorta. Mediastinal contours and pulmonary vascularity otherwise normal. Bibasilar infiltrates, improved on LEFT. Decreased basilar effusions. Underlying emphysematous changes. Upper lungs clear. No pneumothorax. Advanced BILATERAL glenohumeral degenerative changes and chronic rotator cuff tears. Osseous demineralization. IMPRESSION: Emphysematous changes with bibasilar infiltrates, improved on LEFT. Decreased bibasilar effusions and atelectasis Electronically Signed   By: Lavonia Dana M.D.   On: 07/23/2015 18:45   Dg Elbow 2 Views Left  07/04/2015  CLINICAL DATA:  Pain, swelling and limited range of motion of  the left elbow. EXAM: LEFT ELBOW - 2 VIEW COMPARISON:  None. FINDINGS: Mild degenerative changes but no acute fracture or osteochondral abnormality. No joint effusion. No findings to suggest olecranon bursitis. IMPRESSION: Degenerative changes but no acute bony findings for joint effusion. Electronically Signed   By: Marijo Sanes M.D.   On: 06/30/2015 18:47    Medications: I have reviewed the patient's current medications.  Assessment/Plan:  1. Essential thrombocytosis-maintained on hydroxyurea for years . Currently taking hydroxyurea 500 mg daily.  2. Leukocytosis-secondary to the myeloproliferative disorder versus a systemic infection  3. History of Pericarditis  4. Sepsis syndrome, probable pneumonia September 2016  5. Anemia secondary to the underlying myeloproliferative disorder, hydroxyurea, and potentially chronic renal failure, status post red cell transfusion 07/05/2015 (1 unit) and 07/07/2015 (1 unit)  6. Chronic renal failure  7. History of recurrent epistaxis secondary to platelet dysfunction  8. Rapid atrial fibrillation  9. Gait disturbance status post recent evaluation by neurology.  10. Muscle/bone pain  Jack Schultz appears ill. He complains of bilateral arm pain and he is very tender over the upper arm bilaterally. The leukocytosis is most likely related to his myeloproliferative disorder. The myeloproliferative disorder could be evolving toward the fibrotic phase which may explain the anemia. Bone pain came be seen with leukemic transformation, but he does not have a significant number of blasts on the peripheral blood smear. I think it is unlikely the myeloproliferative disorder has progressed leukemia. We can consider a diagnostic bone marrow biopsy over the next few days if the pain persist and is otherwise not explained.  Jack Schultz has experienced a subacute decline over the past month. I think a palliative care  consult is appropriate, but we should continue to  look for reversible causes of his symptoms.  Recommendations:  1. Check CPK 2. Transfuse packed red blood cells as needed for symptoms 3. Agree with palliative care consultation 4. Please call Hematology as needed. I will check on him 07/17/2015.    LOS: 2 days   [place]  07/15/2015, 10:59 AM

## 2015-07-15 NOTE — Progress Notes (Signed)
PROGRESS NOTE  Jack Schultz DOB: 1924/07/16 DOA: 07/07/2015 PCP: [name], MD  HPI/Recap of past 24 hours:  Frail, c/o left shoulder pain, daughter in law in room , help providing history.  Assessment/Plan: Principal Problem:   Atrial fibrillation with RVR (HCC) Active Problems:   Essential thrombocytosis (HCC)   Hypothyroidism   Absolute anemia   CKD (chronic kidney disease) stage 2, GFR 60-89 ml/min   Acute on chronic diastolic HF (heart failure) (HCC)   General weakness   GERD (gastroesophageal reflux disease)   Myelodysplastic syndrome (HCC)   Protein-calorie malnutrition, severe (HCC)   Decreased movement of arm   SOB (shortness of breath)   Leukocytosis   SIRS (systemic inflammatory response syndrome) ([place])   Sepsis (HCC)  Sob likely multifactorial including HACP and afib/ RVR with baseline anemia. Treating underline diseases.  HACP on vanc/zosyn, leukocytosis coming down.  Afib/RVR with h/o heart block during hospitalization a month ago. Currently on asa 81mg  and metoprolol, not a candidate for anticoagulation due to h/o myeloproliferative/? myeolodysplastic syndrome with h/o significant epistaxis. Cardiology input appreciated  Leukocytosis with sepsis, dehydration and underline myeloproliferative/myelodysplastic syndrome, few blast cells on peripheral blood. oncology consulted by admitting MD.  Anemia, likely from MDS, supportive prbc transfusion x1 today, last transfusion on 10/20.  FTT: with recent repeated admission to the hospital, likely will need SNF.   Overall poor prognosis, family reported patient has 30pounds of weight loss this year, multiple falls and started needing frequent blood transfusion, family expressed the desire to talk to palliative care, palliative care service consulted.  Code Status: full  Family Communication: patient and daughter in law  Disposition Plan: remain in  stepdown   Consultants:  Cardiology Dr. Cathie Olden  Oncology Dr. Benay Spice  Procedures:  none  Antibiotics:  vanc/zosyn   Objective: BP 105/51 mmHg  Pulse 105  Temp(Src) 98.7 F (37.1 C) (Axillary)  Resp 23  Ht 5\' 11"  (1.803 m)  Wt 163 lb 9.3 oz (74.2 kg)  BMI 22.83 kg/m2  SpO2 96%  Intake/Output Summary (Last 24 hours) at 07/15/15 9326 Last data filed at 07/15/15 0600  Gross per 24 hour  Intake   1950 ml  Output    500 ml  Net   1450 ml   Filed Weights   07/02/2015 2030 07/14/15 0443  Weight: 163 lb 2.3 oz (74 kg) 163 lb 9.3 oz (74.2 kg)    Exam:   General:  frail  Cardiovascular: IRRR  Respiratory: diminished  Abdomen: Soft/ND/NT, positive BS  Musculoskeletal: No Edema, left shoulder pain, limited range of motion, per family this is chronic  Neuro: not oriented to time, mild intermittent confusion.   Data Reviewed: Basic Metabolic Panel:  Recent Labs Lab 06/29/2015 1833 07/14/15 0205 07/14/15 1925 07/15/15 0342  NA 130* 129* 130* 130*  K 3.9 3.9 3.8 3.6  CL 95* 96* 98* 98*  CO2 27 24 23 25   GLUCOSE 126* 115* 151* 101*  BUN 34* 32* 35* 31*  CREATININE 1.18 1.02 1.04 1.01  CALCIUM 8.0* 7.7* 7.9* 7.8*  MG 2.0  --   --   --    Liver Function Tests:  Recent Labs Lab 07/19/2015 1833 07/14/15 1925 07/15/15 0342  AST 41 73* 61*  ALT 33 44 46  ALKPHOS 130* 133* 149*  BILITOT 1.4* 1.3* 1.5*  PROT 5.7* 5.0* 5.0*  ALBUMIN 2.0* 1.7* 1.7*   No results for input(s): LIPASE, AMYLASE in the last 168 hours. No results for input(s): AMMONIA in  the last 168 hours. CBC:  Recent Labs Lab 07/15/2015 1357 07/14/15 0205 07/15/15 0342  WBC 29.6* 20.1* 19.7*  NEUTROABS 27.7*  --   --   HGB 7.9* 8.9* 7.6*  HCT 25.3* 26.7* 22.8*  MCV 90.2 88.7 89.8  PLT 405* 273 278   Cardiac Enzymes:    Recent Labs Lab 07/19/2015 1833 07/14/15 0205 07/14/15 0843  TROPONINI 0.03 <0.03 <0.03   BNP (last 3 results)  Recent Labs  06/06/15 0512 07/09/2015 1833   BNP 425.4* 470.8*    ProBNP (last 3 results) No results for input(s): PROBNP in the last 8760 hours.  CBG: No results for input(s): GLUCAP in the last 168 hours.  Recent Results (from the past 240 hour(s))  TECHNOLOGIST REVIEW     Status: None   Collection Time: 07/07/15 12:16 PM  Result Value Ref Range Status   Technologist Review 3%Metas and 3% Nrbcs, Rare Promyelocyte and Blast   Final  TECHNOLOGIST REVIEW     Status: None   Collection Time: 07/21/2015  1:57 PM  Result Value Ref Range Status   Technologist Review   Final    Few Metas and Myelocytes present, rare blast and nRBC, hypersegmented neutrophils  Culture, blood (routine x 2) Call MD if unable to obtain prior to antibiotics being given     Status: None (Preliminary result)   Collection Time: 06/25/2015  7:52 PM  Result Value Ref Range Status   Specimen Description BLOOD RIGHT HAND  Final   Special Requests   Final    AEROBIC BOTTLE ONLY 5ML Performed at [facility]    Culture PENDING  Incomplete   Report Status PENDING  Incomplete     Studies: No results found.  Scheduled Meds: . sodium chloride  250 mL Intravenous Once  . sodium chloride   Intravenous Once  . antiseptic oral rinse  7 mL Mouth Rinse BID  . aspirin  81 mg Oral Daily  . feeding supplement (ENSURE ENLIVE)  237 mL Oral TID BM  . ferrous sulfate  325 mg Oral Q breakfast  . levothyroxine  50 mcg Oral QAC breakfast  . metoprolol  5 mg Intravenous Q8H  . piperacillin-tazobactam (ZOSYN)  IV  3.375 g Intravenous Q8H  . potassium chloride SA  20 mEq Oral Daily  . sodium chloride  3 mL Intravenous Q12H  . vancomycin  1,000 mg Intravenous Q24H    Continuous Infusions: . sodium chloride 75 mL/hr at 07/14/15 1400     Time spent: 42mins  Vineta Carone MD, PhD  [facility] Pager [phone]. If 7PM-7AM, please contact night-coverage at [email or web address], password [facility] 07/15/2015, 8:23 AM  LOS: 2 days

## 2015-07-15 NOTE — Progress Notes (Signed)
Patient profile: Patient is a 81 y.o. male with a PMHx of myelodysplastic syndrome, HTN, HL , who was admitted to [facility] on 07/18/2015 for evaluation of with generalized weakness and palpitations. He is found have a white count of 29,000. He was also found to have rapid atrial fibrillation.  He's had H fibrillation before. He is been thought to be a very poor candidate for anticoagulation given his high risk of bleeding and his myeloproliferative disorder.  He had 2 echocardiograms last month- both of which show normal left ventricle systolic function.  Subjective: Tired of sitting up in chair. No pain, a little SOB  Objective: Vital signs in last 24 hours: Temp:  [98.4 F (36.9 C)-99.4 F (37.4 C)] 98.7 F (37.1 C) (10/22 0400) Pulse Rate:  [90-123] 105 (10/22 0600) Resp:  [19-35] 23 (10/22 0600) BP: (79-130)/(41-63) 105/51 mmHg (10/22 0600) SpO2:  [90 %-98 %] 96 % (10/22 0600) Weight change:    Intake/Output from previous day: +556 10/21 0701 - 10/22 0700 In: 2025 [I.V.:1725; IV Piggyback:300] Out: 500 [Urine:500] Intake/Output this shift:    PE: General:Pleasant affect, NAD, + fatigue Skin:Warm and dry, brisk capillary refill HEENT:normocephalic, sclera clear, mucus membranes moist Heart:irreg irreg  without murmur, gallup, rub or click Lungs: with few crackles throughout.  no rhonchi, or wheezes WYO:VZCH, non tender, + BS, do not palpate liver spleen or masses Ext:no lower ext edema, 2+ pedal pulses, 2+ radial pulses Neuro:alert and oriented X 3, MAE, follows commands, + facial symmetry Tele:  A fib rate up now but time for BB.   Lab Results:  Recent Labs  07/14/15 0205 07/15/15 0342  WBC 20.1* 19.7*  HGB 8.9* 7.6*  HCT 26.7* 22.8*  PLT 273 278   BMET  Recent Labs  07/14/15 1925 07/15/15 0342  NA 130* 130*  K 3.8 3.6  CL 98* 98*  CO2 23 25  GLUCOSE 151* 101*  BUN 35* 31*  CREATININE 1.04 1.01  CALCIUM 7.9* 7.8*    Recent Labs   07/14/15 0205 07/14/15 0843  TROPONINI <0.03 <0.03    Lab Results  Component Value Date   CHOL 125 01/10/2015   HDL 23* 01/10/2015   LDLCALC 74 01/10/2015   TRIG 138 01/10/2015   CHOLHDL 5.4 01/10/2015   Lab Results  Component Value Date   HGBA1C 5.2 01/10/2015     Lab Results  Component Value Date   TSH 2.048 07/17/2015    Hepatic Function Panel  Recent Labs  07/15/15 0342  PROT 5.0*  ALBUMIN 1.7*  AST 61*  ALT 46  ALKPHOS 149*  BILITOT 1.5*   No results for input(s): CHOL in the last 72 hours. No results for input(s): PROTIME in the last 72 hours.     Studies/Results: Dg Chest 2 View  07/08/2015  CLINICAL DATA:  Weakness, shortness of breath, hypertension, former smoker EXAM: CHEST  2 VIEW COMPARISON:  06/11/2015 FINDINGS: Enlargement of cardiac silhouette. Atherosclerotic calcification mild tortuosity of thoracic aorta. Mediastinal contours and pulmonary vascularity otherwise normal. Bibasilar infiltrates, improved on LEFT. Decreased basilar effusions. Underlying emphysematous changes. Upper lungs clear. No pneumothorax. Advanced BILATERAL glenohumeral degenerative changes and chronic rotator cuff tears. Osseous demineralization. IMPRESSION: Emphysematous changes with bibasilar infiltrates, improved on LEFT. Decreased bibasilar effusions and atelectasis Electronically Signed   By: Lavonia Dana M.D.   On: 07/11/2015 18:45   Dg Elbow 2 Views Left  07/21/2015  CLINICAL DATA:  Pain, swelling and limited range of motion of the  left elbow. EXAM: LEFT ELBOW - 2 VIEW COMPARISON:  None. FINDINGS: Mild degenerative changes but no acute fracture or osteochondral abnormality. No joint effusion. No findings to suggest olecranon bursitis. IMPRESSION: Degenerative changes but no acute bony findings for joint effusion. Electronically Signed   By: Marijo Sanes M.D.   On: 07/12/2015 18:47    Medications: I have reviewed the patient's current medications. Scheduled Meds: . sodium  chloride  250 mL Intravenous Once  . antiseptic oral rinse  7 mL Mouth Rinse BID  . aspirin  81 mg Oral Daily  . feeding supplement (ENSURE ENLIVE)  237 mL Oral TID BM  . ferrous sulfate  325 mg Oral Q breakfast  . levothyroxine  50 mcg Oral QAC breakfast  . metoprolol  5 mg Intravenous Q8H  . piperacillin-tazobactam (ZOSYN)  IV  3.375 g Intravenous Q8H  . potassium chloride SA  20 mEq Oral Daily  . sodium chloride  3 mL Intravenous Q12H  . vancomycin  1,000 mg Intravenous Q24H   Continuous Infusions: . sodium chloride 75 mL/hr at 07/14/15 1400   PRN Meds:.acetaminophen **OR** acetaminophen, heparin lock flush, heparin lock flush, levalbuterol, morphine injection, ondansetron **OR** ondansetron (ZOFRAN) IV, oxyCODONE, polyethylene glycol, sodium chloride, sodium chloride  Assessment/Plan: Principal Problem:   Atrial fibrillation with RVR (HCC) Active Problems:   Essential thrombocytosis (HCC)   Hypothyroidism   Absolute anemia   CKD (chronic kidney disease) stage 2, GFR 60-89 ml/min   Acute on chronic diastolic HF (heart failure) (HCC)   General weakness   GERD (gastroesophageal reflux disease)   Myelodysplastic syndrome (HCC)   Protein-calorie malnutrition, severe (HCC)   Decreased movement of arm   SOB (shortness of breath)   Leukocytosis   SIRS (systemic inflammatory response syndrome) ([place])   Sepsis ([place])  1. Atrial fibrillation with rapid ventricular response. The patient presents with symptoms of reduced appetite, generalized malaise, fatigue. He also has some palpitations. He was found have a white blood cell count of 29,000. Now down to 20,100.  His found have atrial fibrillation.  Per Dr. Acie Fredrickson "  I think that his atrial fibrillation is secondary to his underlying medical illness which at this point is not clearly defined."  Felt to be volume  depleted and would tolerate a diltiazem drip and I do think that needs to receive some IV fluids for better hydration.  He  is on IV lopressor which is helping to control rate.   He is a poor candidate for anticoagulation given his myeloproliferative disorder .  Tropnins negative    Echo 06/08/15 The  LVF, EF 65-70%, normal wall motion, aortic sclerosis, mild AI, trivial MR, mild LAE (37), mild RAE, mild TR, PASP 37 mmHg, trivial effusion  Echo 06/01/15 EF 55%, mild AI, mild MR, mild LAE, mild RVE, PASP 50 mmHg, trivial effusion   2. Leukocytosis: Further plans per Int. Med and Oncology .   3.  Borderline BP 275 to 90 systolic.   4. Hx CHB in Sept 2016 with stopping of BB and then pt in a fib.  Monitor HR   5. Recent hx of pericarditis.  Could not treat with NSAIDS or Colchicine due to elevated Cr.  On repeat echo only trivial post. Effusion .   LOS: 2 days   Time spent with pt. :15 minutes. Nahser, Wonda Cheng  Nurse Practitioner Certified Pager [phone] or after 5pm and on weekends call [phone] 07/15/2015, 8:13 AM   Attending Note:   The patient was seen and  examined.  Agree with assessment and plan as noted above.  Changes made to the above note as needed.  Pt's HR is still elevated, but is better.  He seems to be feeling better after the IV fluid bolus and the abx. He has been on atenolol in the recent past but he developed complete heart block. I would continue to treat his underlying issues and the HR should improve.   Unfortunately , he has had a steady decline over the past month or so and his prognosis is poor.   Talked with Dr. Erlinda Hong today and daughter in law. We agree that comfort care would be appropriate. Dr. Erlinda Hong will consult palliative care Will sign off. Please call for any questions.  Thayer Headings, Brooke Bonito., MD, [facility] 07/15/2015, 8:13 AM [address]  [place] Pager [phone]

## 2015-07-16 ENCOUNTER — Inpatient Hospital Stay (HOSPITAL_COMMUNITY): Payer: Medicare Other

## 2015-07-16 ENCOUNTER — Encounter (HOSPITAL_COMMUNITY): Payer: Self-pay | Admitting: Radiology

## 2015-07-16 DIAGNOSIS — D72829 Elevated white blood cell count, unspecified: Secondary | ICD-10-CM

## 2015-07-16 DIAGNOSIS — R609 Edema, unspecified: Secondary | ICD-10-CM

## 2015-07-16 DIAGNOSIS — R945 Abnormal results of liver function studies: Secondary | ICD-10-CM

## 2015-07-16 DIAGNOSIS — I4891 Unspecified atrial fibrillation: Secondary | ICD-10-CM

## 2015-07-16 DIAGNOSIS — R627 Adult failure to thrive: Secondary | ICD-10-CM

## 2015-07-16 DIAGNOSIS — R509 Fever, unspecified: Secondary | ICD-10-CM

## 2015-07-16 DIAGNOSIS — L989 Disorder of the skin and subcutaneous tissue, unspecified: Secondary | ICD-10-CM

## 2015-07-16 DIAGNOSIS — M25532 Pain in left wrist: Secondary | ICD-10-CM

## 2015-07-16 DIAGNOSIS — J90 Pleural effusion, not elsewhere classified: Secondary | ICD-10-CM

## 2015-07-16 DIAGNOSIS — M25522 Pain in left elbow: Secondary | ICD-10-CM

## 2015-07-16 DIAGNOSIS — D471 Chronic myeloproliferative disease: Secondary | ICD-10-CM

## 2015-07-16 DIAGNOSIS — R7989 Other specified abnormal findings of blood chemistry: Secondary | ICD-10-CM | POA: Insufficient documentation

## 2015-07-16 DIAGNOSIS — IMO0001 Reserved for inherently not codable concepts without codable children: Secondary | ICD-10-CM | POA: Insufficient documentation

## 2015-07-16 DIAGNOSIS — M79632 Pain in left forearm: Secondary | ICD-10-CM

## 2015-07-16 DIAGNOSIS — J9601 Acute respiratory failure with hypoxia: Secondary | ICD-10-CM

## 2015-07-16 DIAGNOSIS — R161 Splenomegaly, not elsewhere classified: Secondary | ICD-10-CM

## 2015-07-16 DIAGNOSIS — M25529 Pain in unspecified elbow: Secondary | ICD-10-CM | POA: Insufficient documentation

## 2015-07-16 DIAGNOSIS — R0600 Dyspnea, unspecified: Secondary | ICD-10-CM

## 2015-07-16 LAB — COMPREHENSIVE METABOLIC PANEL
ALBUMIN: 1.7 g/dL — AB (ref 3.5–5.0)
ALK PHOS: 176 U/L — AB (ref 38–126)
ALT: 46 U/L (ref 17–63)
AST: 43 U/L — AB (ref 15–41)
Anion gap: 7 (ref 5–15)
BUN: 31 mg/dL — AB (ref 6–20)
CALCIUM: 8.2 mg/dL — AB (ref 8.9–10.3)
CHLORIDE: 99 mmol/L — AB (ref 101–111)
CO2: 24 mmol/L (ref 22–32)
CREATININE: 0.94 mg/dL (ref 0.61–1.24)
GFR calc non Af Amer: >60 mL/min (ref >60)
GLUCOSE: 101 mg/dL — AB (ref 65–99)
Potassium: 3.9 mmol/L (ref 3.5–5.1)
SODIUM: 130 mmol/L — AB (ref 135–145)
Total Bilirubin: 1.9 mg/dL — ABNORMAL HIGH (ref 0.3–1.2)
Total Protein: 5.1 g/dL — ABNORMAL LOW (ref 6.5–8.1)

## 2015-07-16 LAB — BILIRUBIN, FRACTIONATED(TOT/DIR/INDIR)
BILIRUBIN INDIRECT: 1 mg/dL — AB (ref 0.3–0.9)
Bilirubin, Direct: 1.4 mg/dL — ABNORMAL HIGH (ref 0.1–0.5)
Total Bilirubin: 2.4 mg/dL — ABNORMAL HIGH (ref 0.3–1.2)

## 2015-07-16 LAB — CBC WITH DIFFERENTIAL/PLATELET
BASOS PCT: 0 %
Basophils Absolute: 0 10*3/uL (ref 0.0–0.1)
Basophils Absolute: 0 10*3/uL (ref 0.0–0.1)
Basophils Relative: 0 %
EOS ABS: 0 10*3/uL (ref 0.0–0.7)
EOS PCT: 0 %
Eosinophils Absolute: 0 10*3/uL (ref 0.0–0.7)
Eosinophils Relative: 0 %
HCT: 28.5 % — ABNORMAL LOW (ref 39.0–52.0)
HCT: 26.5 % — ABNORMAL LOW (ref 39.0–52.0)
HEMOGLOBIN: 9.2 g/dL — AB (ref 13.0–17.0)
Hemoglobin: 8.7 g/dL — ABNORMAL LOW (ref 13.0–17.0)
LYMPHS ABS: 1.2 10*3/uL (ref 0.7–4.0)
LYMPHS PCT: 3 %
Lymphocytes Relative: 4 %
Lymphs Abs: 0.8 10*3/uL (ref 0.7–4.0)
MCH: 29.6 pg (ref 26.0–34.0)
MCH: 30.1 pg (ref 26.0–34.0)
MCHC: 32.3 g/dL (ref 30.0–36.0)
MCHC: 32.8 g/dL (ref 30.0–36.0)
MCV: 91.6 fL (ref 78.0–100.0)
MCV: 91.7 fL (ref 78.0–100.0)
MONO ABS: 1.6 10*3/uL — AB (ref 0.1–1.0)
MONOS PCT: 4 %
Monocytes Absolute: 1.1 10*3/uL — ABNORMAL HIGH (ref 0.1–1.0)
Monocytes Relative: 5 %
NEUTROS PCT: 91 %
Neutro Abs: 25.1 10*3/uL — ABNORMAL HIGH (ref 1.7–7.7)
Neutro Abs: 28.3 10*3/uL — ABNORMAL HIGH (ref 1.7–7.7)
Neutrophils Relative %: 93 %
PLATELETS: 290 10*3/uL (ref 150–400)
Platelets: 296 10*3/uL (ref 150–400)
RBC: 3.11 MIL/uL — AB (ref 4.22–5.81)
RBC: 2.89 MIL/uL — ABNORMAL LOW (ref 4.22–5.81)
RDW: 20 % — ABNORMAL HIGH (ref 11.5–15.5)
RDW: 19.8 % — AB (ref 11.5–15.5)
WBC: 27 10*3/uL — AB (ref 4.0–10.5)
WBC: 31.1 10*3/uL — AB (ref 4.0–10.5)
nRBC: 2 /100 WBC — ABNORMAL HIGH

## 2015-07-16 LAB — URINALYSIS, ROUTINE W REFLEX MICROSCOPIC
GLUCOSE, UA: NEGATIVE mg/dL
Hgb urine dipstick: NEGATIVE
KETONES UR: NEGATIVE mg/dL
LEUKOCYTES UA: NEGATIVE
NITRITE: NEGATIVE
PH: 5.5 (ref 5.0–8.0)
PROTEIN: 30 mg/dL — AB
SPECIFIC GRAVITY, URINE: 1.022 (ref 1.005–1.030)
Urobilinogen, UA: 1 mg/dL (ref 0.0–1.0)

## 2015-07-16 LAB — BLOOD GAS, ARTERIAL
Acid-Base Excess: 0.8 mmol/L (ref 0.0–2.0)
BICARBONATE: 23.4 meq/L (ref 20.0–24.0)
Drawn by: 33099
FIO2: 0.55
O2 Saturation: 93.1 %
PCO2 ART: 31 mmHg — AB (ref 35.0–45.0)
PH ART: 7.49 — AB (ref 7.350–7.450)
PO2 ART: 66.7 mmHg — AB (ref 80.0–100.0)
Patient temperature: 98.6
TCO2: 21.6 mmol/L (ref 0–100)

## 2015-07-16 LAB — PROTIME-INR
INR: 1.42 (ref 0.00–1.49)
Prothrombin Time: 17.4 seconds — ABNORMAL HIGH (ref 11.6–15.2)

## 2015-07-16 LAB — SEDIMENTATION RATE: Sed Rate: 104 mm/hr — ABNORMAL HIGH (ref 0–16)

## 2015-07-16 LAB — GLUCOSE, CAPILLARY: GLUCOSE-CAPILLARY: 98 mg/dL (ref 65–99)

## 2015-07-16 LAB — CK: Total CK: 19 U/L — ABNORMAL LOW (ref 49–397)

## 2015-07-16 LAB — URIC ACID: URIC ACID, SERUM: 3.3 mg/dL — AB (ref 4.4–7.6)

## 2015-07-16 LAB — URINE MICROSCOPIC-ADD ON: Urine-Other: NONE SEEN

## 2015-07-16 LAB — PROCALCITONIN: PROCALCITONIN: 1.32 ng/mL

## 2015-07-16 LAB — C-REACTIVE PROTEIN: CRP: 18 mg/dL — ABNORMAL HIGH (ref <1.0)

## 2015-07-16 MED ORDER — LIP MEDEX EX OINT
TOPICAL_OINTMENT | CUTANEOUS | Status: AC
  Administered 2015-07-16: 1
  Filled 2015-07-16: qty 7

## 2015-07-16 MED ORDER — IOHEXOL 350 MG/ML SOLN
100.0000 mL | Freq: Once | INTRAVENOUS | Status: AC | PRN
  Administered 2015-07-16: 100 mL via INTRAVENOUS

## 2015-07-16 MED ORDER — CETYLPYRIDINIUM CHLORIDE 0.05 % MT LIQD: 7.0000 mL | Freq: Two times a day (BID) | OROMUCOSAL | Status: DC

## 2015-07-16 MED ORDER — FUROSEMIDE 10 MG/ML IJ SOLN
40.0000 mg | Freq: Once | INTRAMUSCULAR | Status: AC
  Administered 2015-07-16: 40 mg via INTRAVENOUS
  Filled 2015-07-16: qty 4

## 2015-07-16 MED ORDER — CHLORHEXIDINE GLUCONATE 0.12 % MT SOLN
15.0000 mL | Freq: Two times a day (BID) | OROMUCOSAL | Status: DC
  Administered 2015-07-16 – 2015-07-17 (×4): 15 mL via OROMUCOSAL
  Filled 2015-07-16 (×3): qty 15

## 2015-07-16 MED ORDER — FUROSEMIDE 10 MG/ML IJ SOLN
40.0000 mg | Freq: Every day | INTRAMUSCULAR | Status: DC
Start: 2015-07-16 — End: 2015-07-18
  Administered 2015-07-16 – 2015-07-17 (×2): 40 mg via INTRAVENOUS
  Filled 2015-07-16 (×2): qty 4

## 2015-07-16 NOTE — Progress Notes (Signed)
Peripherally Inserted Central Catheter/Midline Placement  The IV Nurse has discussed with the patient and/or persons authorized to consent for the patient, the purpose of this procedure and the potential benefits and risks involved with this procedure.  The benefits include less needle sticks, lab draws from the catheter and patient may be discharged home with the catheter.  Risks include, but not limited to, infection, bleeding, blood clot (thrombus formation), and puncture of an artery; nerve damage and irregular heat beat.  Alternatives to this procedure were also discussed. Daughter and wife at bedside, verbalize understanding of the procedure.  Daughter, POA signed consent. PICC/Midline Placement Documentation     Unable to place PICC line.  Able to access RA brachial vein without difficulty with measurement of 7 % occupancy.  Unable to thread PICC beyond the shoulder area.  Pt in discomfort with arm placement due to RA / shoulder mobility/contracture.  Left arm restricted due to immobility and suspected infection per daughter with planned evaluation of LUE. No other RA veins appreciated for PICC placement.  Dr. Erlinda Hong notified via telephone.  Family also notified.  2 PIV in place currently.  Please refer to IR if need PICC.     Rolena Infante 07/16/2015, 6:39 PM

## 2015-07-16 NOTE — Progress Notes (Signed)
ANTIBIOTIC CONSULT NOTE - follow-up  Pharmacy Consult for Vancomycin and Zosyn Indication: HCAP  Allergies  Allergen Reactions  . Ace Inhibitors Cough    Patient Measurements: Height: 5\' 11"  (180.3 cm) Weight: 165 lb 2 oz (74.9 kg) IBW/kg (Calculated) : 75.3  Vital Signs: Temp: 98.8 F (37.1 C) (10/23 0700) Temp Source: Axillary (10/23 0700) BP: 117/52 mmHg (10/23 0800) Pulse Rate: 103 (10/23 0900) Intake/Output from previous day: 10/22 0701 - 10/23 0700 In: 2679 [P.O.:200; I.V.:1800; Blood:329; IV Piggyback:350] Out: 900 [Urine:900] Intake/Output from this shift: Total I/O In: -  Out: 125 [Urine:125]  Labs:  Recent Labs  07/14/15 0205 07/14/15 1925 07/15/15 0342 07/16/15 0350  WBC 20.1*  --  19.7* 27.0*  HGB 8.9*  --  7.6* 9.2*  PLT 273  --  278 296  CREATININE 1.02 1.04 1.01 0.94   Estimated Creatinine Clearance: 56.4 mL/min (by C-G formula based on Cr of 0.94). No results for input(s): VANCOTROUGH, VANCOPEAK, VANCORANDOM, GENTTROUGH, GENTPEAK, GENTRANDOM, TOBRATROUGH, TOBRAPEAK, TOBRARND, AMIKACINPEAK, AMIKACINTROU, AMIKACIN in the last 72 hours.   Microbiology: Recent Results (from the past 720 hour(s))  TECHNOLOGIST REVIEW     Status: None   Collection Time: 06/20/15  2:02 PM  Result Value Ref Range Status   Technologist Review   Final    Few hypersegmented neutrophils,large and giant plts  TECHNOLOGIST REVIEW     Status: None   Collection Time: 07/04/15  1:49 PM  Result Value Ref Range Status   Technologist Review 2% Blasts, Few Metas and Myelos, Occ Giant Plt  Final  TECHNOLOGIST REVIEW     Status: None   Collection Time: 07/07/15 12:16 PM  Result Value Ref Range Status   Technologist Review 3%Metas and 3% Nrbcs, Rare Promyelocyte and Blast   Final  TECHNOLOGIST REVIEW     Status: None   Collection Time: 07/21/2015  1:57 PM  Result Value Ref Range Status   Technologist Review   Final    Few Metas and Myelocytes present, rare blast and nRBC,  hypersegmented neutrophils  Culture, blood (routine x 2) Call MD if unable to obtain prior to antibiotics being given     Status: None (Preliminary result)   Collection Time: 06/24/2015  7:30 PM  Result Value Ref Range Status   Specimen Description BLOOD LEFT ARM  Final   Special Requests BOTTLES DRAWN AEROBIC AND ANAEROBIC  10CC  Final   Culture   Final    NO GROWTH 2 DAYS Performed at [facility]    Report Status PENDING  Incomplete  Culture, blood (routine x 2) Call MD if unable to obtain prior to antibiotics being given     Status: None (Preliminary result)   Collection Time: 07/12/2015  7:52 PM  Result Value Ref Range Status   Specimen Description BLOOD RIGHT HAND  Final   Special Requests AEROBIC BOTTLE ONLY 5ML  Final   Culture   Final    NO GROWTH 2 DAYS Performed at [facility]    Report Status PENDING  Incomplete  Urine culture     Status: None   Collection Time: 06/30/2015  9:24 PM  Result Value Ref Range Status   Specimen Description URINE, CLEAN CATCH  Final   Special Requests NONE  Final   Culture   Final    NO GROWTH 1 DAY Performed at [facility]    Report Status 07/15/2015 FINAL  Final    Assessment: 81 yo male with essential thrombocytosis maintained on hydroxyurea  presents from NH with increased lethargy, confusion and tachycardia. He was hospitalized in early September for sepsis and received vanc/zosyn at that time.    10/20 >> Vanc >> 10/20 >> Zosyn >>  10/20 blood x 2: NGTD 10/20 urine: NG 10/20 urine strep = NEG/legionella = NEG Sputum:  Renal: Scr WNL/stable WBC trending up Tm 101.7  Goal of Therapy:  Vancomycin trough level 15-20 mcg/ml  Antibiotic dosing appropriate for indication and renal function  Plan:   Vancomycin 1g IV q24h Check trough at steady state - check 10/24 if remains on vancomycin  Zosyn 3.375gm IV q8h (4hr extended infusions) Follow up renal function & cultures, clinical course  Doreene Eland,  PharmD, BCPS.   Pager: [phone] 07/16/2015 9:42 AM

## 2015-07-16 NOTE — Progress Notes (Signed)
During CT pt right AC PIV infiltrated and about 53mL infiltrated per tech Amy.  IV team contacted due to hard stick and CT completed.  Informed Dr. Erlinda Hong about need for central line and IV infiltration at CT.  Irven Baltimore, RN.

## 2015-07-16 NOTE — Progress Notes (Signed)
Removed venturi mask and replaced with 6 liters nasal cannula.  Within two minutes O2 sats dropped from upper 90's to 87%.  MD notified.  Venturi mask replaced at 50% FIO2.

## 2015-07-16 NOTE — Consult Note (Addendum)
Name: Jack Schultz MRN: [id number] DOB: December 07, 1923    ADMISSION DATE:  07/19/2015 CONSULTATION DATE:  07/16/15  REFERRING MD :  Florencia Reasons  CHIEF COMPLAINT:  Hypoxemia, respiratory failure.  BRIEF PATIENT DESCRIPTION: Admitted on 10/24 failure to thrive, anemia, dyspnea, rapid A. fib  SIGNIFICANT EVENTS    STUDIES:  CXR (07/06/15)  Enlargement of cardiac silhouette. Increased BILATERAL pulmonary infiltrates and RIGHT pleural effusion, question CHF.  HISTORY OF PRESENT ILLNESS:   81 year old male with history of hypertension, hyperlipidemia, thrombocytosis (on hydroxyurea), recurrent epistaxis for to be related to myeloproliferative disorder and aspirin therapy hypothyroidism, iron deficiency anemia and GERD. He had a recent admission for failure to thrive he was found to have healthcare associated pneumonia. His stay was complicated by acute kidney injury, complete heart block, diastolic heart failure, anemia requiring transfusions. He was discharged on 06/13/15 to SNF.  He was admitted again on 06/24/2015 from the oncologist office for malaise, rapid A. fib, dyspnea. PCCM consulted for ongoing respiratory failure with hypoxemia.  PAST MEDICAL HISTORY :   has a past medical history of Hypertension; Hyperlipidemia; Pre-diabetes; Thyroid disease; Thrombocytosis ([place]); GERD (gastroesophageal reflux disease); Vitamin D deficiency; Vision abnormalities; Neuropathy ([place]); and Syncope and collapse.  has past surgical history that includes No past surgeries. Prior to Admission medications   Medication Sig Start Date End Date Taking? Authorizing Provider  acetaminophen (TYLENOL) 500 MG tablet Take 500 mg by mouth every 6 (six) hours as needed for headache.   Yes Historical Provider, MD  aspirin 81 MG tablet Take 81 mg by mouth daily.   Yes Historical Provider, MD  cefdinir (OMNICEF) 300 MG capsule Take 300 mg by mouth at bedtime. ABT Start Date 07/07/15 & End Date 07/19/2015.   Yes  Historical Provider, MD  Cholecalciferol (VITAMIN D PO) Take 5,000 Units by mouth daily.   Yes Historical Provider, MD  feeding supplement, ENSURE ENLIVE, (ENSURE ENLIVE) LIQD Take 237 mLs by mouth 2 (two) times daily between meals. Patient taking differently: Take 237 mLs by mouth 4 (four) times daily.  06/13/15  Yes Belkys A Regalado, MD  ferrous sulfate 325 (65 FE) MG tablet Take 325 mg by mouth daily with breakfast.   Yes Historical Provider, MD  furosemide (LASIX) 40 MG tablet Take 40 mg by mouth daily.   Yes Historical Provider, MD  hydroxyurea (HYDREA) 500 MG capsule Take 500 mg by mouth daily.   Yes Historical Provider, MD  levothyroxine (SYNTHROID, LEVOTHROID) 50 MCG tablet Take 50 mcg by mouth daily.   Yes Historical Provider, MD  metoprolol tartrate (LOPRESSOR) 25 MG tablet Take 0.5 tablets (12.5 mg total) by mouth 2 (two) times daily. Patient taking differently: Take 25 mg by mouth 2 (two) times daily.  06/23/15  Yes Liliane Shi, PA-C  potassium chloride SA (K-DUR,KLOR-CON) 20 MEQ tablet Take 1 tablet (20 mEq total) by mouth daily. 06/13/15  Yes Belkys A Regalado, MD  acetaminophen (TYLENOL) 650 MG suppository Place 1 suppository (650 mg total) rectally every 6 (six) hours as needed for mild pain (or Fever >/= 101). 06/13/15   Belkys A Regalado, MD  polyethylene glycol (MIRALAX / GLYCOLAX) packet Take 17 g by mouth daily as needed for mild constipation. 06/13/15   Elmarie Shiley, MD   Allergies  Allergen Reactions  . Ace Inhibitors Cough    FAMILY HISTORY:  family history includes Heart disease in his father; Hypertension in his father. SOCIAL HISTORY:  reports that he quit smoking about 39 years ago.  His smoking use included Cigarettes. He quit smokeless tobacco use about 6 weeks ago. His smokeless tobacco use included Snuff. He reports that he does not drink alcohol or use illicit drugs.  REVIEW OF SYSTEMS:   Constitutional: Negative for fever, chills, weight loss,  malaise/fatigue and diaphoresis.  HENT: Negative for hearing loss, ear pain, nosebleeds, congestion, sore throat, neck pain, tinnitus and ear discharge.   Eyes: Negative for blurred vision, double vision, photophobia, pain, discharge and redness.  Respiratory: Negative for cough, hemoptysis, sputum production, shortness of breath, wheezing and stridor.   Cardiovascular: Negative for chest pain, palpitations, orthopnea, claudication, leg swelling and PND.  Gastrointestinal: Negative for heartburn, nausea, vomiting, abdominal pain, diarrhea, constipation, blood in stool and melena.  Genitourinary: Negative for dysuria, urgency, frequency, hematuria and flank pain.  Musculoskeletal: Negative for myalgias, back pain, joint pain and falls.  Skin: Negative for itching and rash.  Neurological: Negative for dizziness, tingling, tremors, sensory change, speech change, focal weakness, seizures, loss of consciousness, weakness and headaches.  Endo/Heme/Allergies: Negative for environmental allergies and polydipsia. Does not bruise/bleed easily.  SUBJECTIVE:   VITAL SIGNS: Temp:  [97.1 F (36.2 C)-101.7 F (38.7 C)] 99.2 F (37.3 C) (10/23 1200) Pulse Rate:  [94-115] 115 (10/23 1000) Resp:  [15-34] 28 (10/23 1000) BP: (100-126)/(42-62) 110/47 mmHg (10/23 1000) SpO2:  [86 %-99 %] 98 % (10/23 1000) FiO2 (%):  [55 %] 55 % (10/22 2200) Weight:  [165 lb 2 oz (74.9 kg)] 165 lb 2 oz (74.9 kg) (10/23 0752)  PHYSICAL EXAMINATION: General: Sleeping but arousable, no distress Neuro:  Cranial nerves intact, no gross focal deficits. HEENT:  PERRLA, moist mucous membranes Cardiovascular:  Regular rate and rhythm, no MRG Lungs:  Bilateral crackles. No wheeze Abdomen:  Soft, positive bowel sounds, nontender nondistended Skin:  Intact   Recent Labs Lab 07/14/15 1925 07/15/15 0342 07/16/15 0350  NA 130* 130* 130*  K 3.8 3.6 3.9  CL 98* 98* 99*  CO2 23 25 24   BUN 35* 31* 31*  CREATININE 1.04 1.01 0.94   GLUCOSE 151* 101* 101*    Recent Labs Lab 07/14/15 0205 07/15/15 0342 07/16/15 0350  HGB 8.9* 7.6* 9.2*  HCT 26.7* 22.8* 28.5*  WBC 20.1* 19.7* 27.0*  PLT 273 278 296   Dg Chest Port 1 View  07/16/2015  CLINICAL DATA:  Dyspnea, LEFT side pain, flank pain, hypertension, stage II chronic kidney disease, history atrial fibrillation EXAM: PORTABLE CHEST 1 VIEW COMPARISON:  Portable exam 0911 hours compared to 07/12/2015 FINDINGS: Enlargement of cardiac silhouette. Atherosclerotic calcification aorta. Diffuse pulmonary infiltrates and probable RIGHT pleural effusion increased since previous exam. No pneumothorax. Bones demineralized with advanced BILATERAL glenohumeral degenerative changes and BILATERAL chronic rotator cuff tears. IMPRESSION: Enlargement of cardiac silhouette. Increased BILATERAL pulmonary infiltrates and RIGHT pleural effusion, question CHF. Electronically Signed   By: Lavonia Dana M.D.   On: 07/16/2015 09:50   US Abdomen Limited Ruq  07/16/2015  CLINICAL DATA:  Elevated LFTs. EXAM: US ABDOMEN LIMITED - RIGHT UPPER QUADRANT COMPARISON:  CT the abdomen 06/08/2015. Abdominal ultrasound 05/31/2015. FINDINGS: Gallbladder: No gallstones or wall thickening visualized. No sonographic Murphy sign noted. Gallbladder wall thickness is within normal limits a 3.0 mm. Common bile duct: Diameter: 2.0 mm, within normal limits Liver: The right lobe is somewhat prominent. Finding suggests cirrhosis. Focal lesions are present. A right pleural effusion is again noted. Trace abdominal ascites is also evident. IMPRESSION: 1. Suggestion of hepatic cirrhosis. 2. Right pleural effusion. 3. Trace abdominal ascites. 4.  The gallbladder and common bile duct are within normal limits. Electronically Signed   By: San Morelle M.D.   On: 07/16/2015 12:04    ASSESSMENT / PLAN: Dyspnea with hypoxemia.  Respiratory symptoms are likely secondary to heart failure, pulmonary vascular congestion. He has  already received Lasix today and his oxygen requirements have improved from a Ventimask to nasal cannula. It is possible that he may have an underlying pneumonia or PE. His lower extremity Dopplers are negative and he is scheduled to get a CT angiogram today.  He should be continued on diuresis, broad-spectrum antibiotics for now and follow up on the echocardiogram and the CT.   Reccs: - Continue diuresis. Keep 1 lt negative today. - Follow up on CT angiogram and echocardiogram. - Broad spectrum abx with vanco and zosyn.  - Check cultures and procalcitonin.  Marshell Garfinkel MD [facility] Pager [phone] If no answer or after 3pm call: [phone] 07/16/2015, 1:50 PM

## 2015-07-16 NOTE — Progress Notes (Addendum)
PROGRESS NOTE  Jack Schultz:[id number] DOB: 15-Nov-1923 DOA: 07/18/2015 PCP: [name], MD  HPI/Recap of past 24 hours:  Develop fever and hypoxia, now on ventimask, Frail, denies shoulder pain this am, does report abdominal pain, daughter in  room , reported small blister on left thumb. Reported left arm edema.  Assessment/Plan: Principal Problem:   Atrial fibrillation with RVR (HCC) Active Problems:   Essential thrombocytosis (HCC)   Hypothyroidism   Absolute anemia   CKD (chronic kidney disease) stage 2, GFR 60-89 ml/min   Acute on chronic diastolic HF (heart failure) (HCC)   General weakness   GERD (gastroesophageal reflux disease)   Myelodysplastic syndrome (HCC)   Protein-calorie malnutrition, severe (HCC)   Decreased movement of arm   SOB (shortness of breath)   Leukocytosis   SIRS (systemic inflammatory response syndrome) (HCC)   Sepsis (HCC)   FTT (failure to thrive) in adult   Encounter for palliative care  Hypoxia: repeat cxr bilateral infiltrate, fluids overload? D/c ivf, lasix, patient and family agreed to proceed to CTA to r/o PE, family reported recent significant left arm edema. Stat ABG pending, pccm Dr. Vaughan Browner consulted.  Fever, worsening of leukocytosis while vanc and zosyn, so far no clear source identified. Ct chest ab/pel. Infectious disease consulted.  Left arm edema: venous ultrasound to r/o dvt.  Left thumb blister: biopsy? Nontender.  Afib/RVR with h/o heart block during hospitalization a month ago. Currently on asa $Remo'81mg'mJePC$  and metoprolol, not a candidate for anticoagulation due to h/o myeloproliferative/? myeolodysplastic syndrome with h/o significant epistaxis. Cardiology input appreciated  Leukocytosis with sepsis, dehydration and underline myeloproliferative/myelodysplastic syndrome, few blast cells on peripheral blood. oncology consulted by admitting MD. Bone marrow biopsy?  Acute on chronic Anemia, likely from MDS,  supportive prbc transfusion x1 today, last transfusion on 10/20. ( has required repeated blood transfusion since September 2016).  FTT: with recent repeated admission to the hospital, likely will need SNF if patient able to improve from current condition  Overall poor prognosis, family reported patient has 30pounds of weight loss this year, multiple falls and started needing frequent blood transfusion, family expressed the desire to talk to palliative care, palliative care service consulted. Daughter expressed wishes today to do more tests.  Code Status: full  Family Communication: patient and daughter   Disposition Plan: remain in stepdown   Consultants:  Cardiology Dr. Cathie Olden  Oncology Dr. Benay Spice  Procedures:  none  Antibiotics:  vanc/zosyn   Objective: BP 117/52 mmHg  Pulse 103  Temp(Src) 98.8 F (37.1 C) (Axillary)  Resp 21  Ht $R'5\' 11"'aG$  (1.803 m)  Wt 165 lb 2 oz (74.9 kg)  BMI 23.04 kg/m2  SpO2 86%  Intake/Output Summary (Last 24 hours) at 07/16/15 0956 Last data filed at 07/16/15 0754  Gross per 24 hour  Intake   2329 ml  Output    775 ml  Net   1554 ml   Filed Weights   07/21/2015 2030 07/14/15 0443 07/16/15 0752  Weight: 163 lb 2.3 oz (74 kg) 163 lb 9.3 oz (74.2 kg) 165 lb 2 oz (74.9 kg)    Exam:   General:  frail  Cardiovascular: IRRR  Respiratory: diminished, + carckles  Abdomen: Soft/ND/NT, positive BS, mild tender? (patient complains of pain at different location at different time)  Musculoskeletal: No Edema, left shoulder pain, limited range of motion, per family this is chronic  Neuro: not oriented to time, mild intermittent confusion.   Data Reviewed: Basic Metabolic Panel:  Recent Labs  Lab 07/11/2015 1833 07/14/15 0205 07/14/15 1925 07/15/15 0342 07/16/15 0350  NA 130* 129* 130* 130* 130*  K 3.9 3.9 3.8 3.6 3.9  CL 95* 96* 98* 98* 99*  CO2 $Re'27 24 23 25 24  'xUA$ GLUCOSE 126* 115* 151* 101* 101*  BUN 34* 32* 35* 31* 31*  CREATININE  1.18 1.02 1.04 1.01 0.94  CALCIUM 8.0* 7.7* 7.9* 7.8* 8.2*  MG 2.0  --   --   --   --    Liver Function Tests:  Recent Labs Lab 06/30/2015 1833 07/14/15 1925 07/15/15 0342 07/16/15 0350  AST 41 73* 61* 43*  ALT 33 44 46 46  ALKPHOS 130* 133* 149* 176*  BILITOT 1.4* 1.3* 1.5* 1.9*  PROT 5.7* 5.0* 5.0* 5.1*  ALBUMIN 2.0* 1.7* 1.7* 1.7*   No results for input(s): LIPASE, AMYLASE in the last 168 hours. No results for input(s): AMMONIA in the last 168 hours. CBC:  Recent Labs Lab 07/15/2015 1357 07/14/15 0205 07/15/15 0342 07/16/15 0350  WBC 29.6* 20.1* 19.7* 27.0*  NEUTROABS 27.7*  --   --  25.1*  HGB 7.9* 8.9* 7.6* 9.2*  HCT 25.3* 26.7* 22.8* 28.5*  MCV 90.2 88.7 89.8 91.6  PLT 405* 273 278 296   Cardiac Enzymes:    Recent Labs Lab 07/06/2015 1833 07/14/15 0205 07/14/15 0843 07/16/15 0350  CKTOTAL  --   --   --  19*  TROPONINI 0.03 <0.03 <0.03  --    BNP (last 3 results)  Recent Labs  06/06/15 0512 06/26/2015 1833  BNP 425.4* 470.8*    ProBNP (last 3 results) No results for input(s): PROBNP in the last 8760 hours.  CBG:  Recent Labs Lab 07/16/15 0744  GLUCAP 98    Recent Results (from the past 240 hour(s))  TECHNOLOGIST REVIEW     Status: None   Collection Time: 07/07/15 12:16 PM  Result Value Ref Range Status   Technologist Review 3%Metas and 3% Nrbcs, Rare Promyelocyte and Blast   Final  TECHNOLOGIST REVIEW     Status: None   Collection Time: 07/16/2015  1:57 PM  Result Value Ref Range Status   Technologist Review   Final    Few Metas and Myelocytes present, rare blast and nRBC, hypersegmented neutrophils  Culture, blood (routine x 2) Call MD if unable to obtain prior to antibiotics being given     Status: None (Preliminary result)   Collection Time: 06/28/2015  7:30 PM  Result Value Ref Range Status   Specimen Description BLOOD LEFT ARM  Final   Special Requests BOTTLES DRAWN AEROBIC AND ANAEROBIC  10CC  Final   Culture   Final    NO GROWTH 2  DAYS Performed at [facility]    Report Status PENDING  Incomplete  Culture, blood (routine x 2) Call MD if unable to obtain prior to antibiotics being given     Status: None (Preliminary result)   Collection Time: 07/05/2015  7:52 PM  Result Value Ref Range Status   Specimen Description BLOOD RIGHT HAND  Final   Special Requests AEROBIC BOTTLE ONLY 5ML  Final   Culture   Final    NO GROWTH 2 DAYS Performed at [facility]    Report Status PENDING  Incomplete  Urine culture     Status: None   Collection Time: 06/29/2015  9:24 PM  Result Value Ref Range Status   Specimen Description URINE, CLEAN CATCH  Final   Special Requests NONE  Final  Culture   Final    NO GROWTH 1 DAY Performed at [facility]    Report Status 07/15/2015 FINAL  Final     Studies: Dg Chest Port 1 View  07/16/2015  CLINICAL DATA:  Dyspnea, LEFT side pain, flank pain, hypertension, stage II chronic kidney disease, history atrial fibrillation EXAM: PORTABLE CHEST 1 VIEW COMPARISON:  Portable exam 0911 hours compared to 07/03/2015 FINDINGS: Enlargement of cardiac silhouette. Atherosclerotic calcification aorta. Diffuse pulmonary infiltrates and probable RIGHT pleural effusion increased since previous exam. No pneumothorax. Bones demineralized with advanced BILATERAL glenohumeral degenerative changes and BILATERAL chronic rotator cuff tears. IMPRESSION: Enlargement of cardiac silhouette. Increased BILATERAL pulmonary infiltrates and RIGHT pleural effusion, question CHF. Electronically Signed   By: Lavonia Dana M.D.   On: 07/16/2015 09:50    Scheduled Meds: . sodium chloride  250 mL Intravenous Once  . antiseptic oral rinse  7 mL Mouth Rinse BID  . aspirin  81 mg Oral Daily  . chlorhexidine  15 mL Mouth Rinse BID  . feeding supplement (ENSURE ENLIVE)  237 mL Oral TID BM  . ferrous sulfate  325 mg Oral Q breakfast  . furosemide  40 mg Intravenous Once  . levothyroxine  50 mcg Oral QAC breakfast    . metoprolol  5 mg Intravenous Q8H  . piperacillin-tazobactam (ZOSYN)  IV  3.375 g Intravenous Q8H  . potassium chloride SA  20 mEq Oral Daily  . sodium chloride  3 mL Intravenous Q12H  . vancomycin  1,000 mg Intravenous Q24H    Continuous Infusions:     Time spent: 27mins  Leibish Mcgregor MD, PhD  [facility] Pager [phone]. If 7PM-7AM, please contact night-coverage at [email or web address], password [facility] 07/16/2015, 9:56 AM  LOS: 3 days

## 2015-07-16 NOTE — Progress Notes (Signed)
*  PRELIMINARY RESULTS* Vascular Ultrasound Left upper extremity venous duplex has been completed.  Preliminary findings: No evidence of DVT or superficial thrombosis in visualized veins of left upper extremity.   Landry Mellow, RDMS, RVT  07/16/2015, 10:47 AM

## 2015-07-16 NOTE — Progress Notes (Signed)
  Echocardiogram 2D Echocardiogram has been performed.  Jack Schultz 07/16/2015, 1:30 PM

## 2015-07-16 NOTE — Consult Note (Addendum)
[place] for Infectious Disease    Date of Admission:  06/30/2015  Date of Consult:  07/16/2015  Reason for Consult: FUO, Leukocytosis of unknown cause, faiure to thrive  Referring Physician: Dr. Erlinda Hong   HPI: Jack Schultz is an 81 y.o. male. With Myeloproliferative disorder with essential thrombocytosis, who has had several month history of decline. He began to develop generalized weakness and recurrent falls. He had a fall in August  and was so weak that his family had difficulty getting him up and back to bed. He has had a poor appetite and has been losing weight. His daughter-in-law estimates that he's lost about 10 pounds in that month. He believes he started having fever about 2 weeks piror to admission in September.Marland Kitchen He then developed acute pain, redness and swelling of his left hand and forearm. He was seen by his primary care physician on 05/25/2015 and was prescribed cephalexin. The left arm cellulitis improved but he continued to have fever and got weaker. He is also noticed acute on chronic left shoulder pain. He does not believe that he injured his left arm or shoulder in any of his recent falls. He also had elbow pain and gout was contemplated then.  During the hospitalization he had complete heart block that then resolved. Dr. Megan Salon narrowed him to IV vancomycin and rocephin but then pt had worsening fevers. He stopped antibiotics. Imaging showed pleural effusions (which had been present before). Dr. Megan Salon then began ceftazidime for possible HCAP and he apparently improved enough to be DC to SNF. He was followed by Oncology, Neurology. He then developed acute onset of lethargy, confusion and SNF contacted Oncology. IN the office he was systemically ill with Atrial fibrillation with  RVR. He was admitted to [facility]. UA negative. CXR with bilateral pleural effusions.  Blood cultures, urine cultures taken and all without growth. He is again on vancomycin and  zosyn and continues to have fevers and persistent leukocytosis.  CT chest abdomen and pelvis with contrast today showed:  Moderate bilateral pleural effusions are noted with adjacent subsegmental atelectasis, right greater than left.  No evidence of large central pulmonary embolus. Smaller peripheral pulmonary emboli cannot be excluded on the basis of this exam due to respiratory motion artifact.  Atherosclerosis of thoracic aorta is noted without aneurysm or dissection.  Moderate splenomegaly is noted.  Atherosclerosis of abdominal aorta is noted without aneurysm formation.  Mild anasarca.   He is scheduled for bone marrow biopsy tomorrow.  On exam he is confused, weak. He has singficant tenderness to palpation of both arms which are warm, esp in elbows L>R , left wrist tender. He has lesion on hand that is violaceous see picture below    Past Medical History  Diagnosis Date  . Hypertension   . Hyperlipidemia   . Pre-diabetes   . Thyroid disease   . Thrombocytosis ([place])   . GERD (gastroesophageal reflux disease)   . Vitamin D deficiency   . Vision abnormalities   . Neuropathy ([place])   . Syncope and collapse     Past Surgical History  Procedure Laterality Date  . No past surgeries      Social History:  reports that he quit smoking about 39 years ago. His smoking use included Cigarettes. He quit smokeless tobacco use about 6 weeks ago. His smokeless tobacco use included Snuff. He reports that he does not drink alcohol or use illicit drugs.  Family History  Problem Relation Age of Onset  . Hypertension Father   . Heart disease Father     Allergies  Allergen Reactions  . Ace Inhibitors Cough     Medications: I have reviewed patients current medications as documented in Epic Anti-infectives    Start     Dose/Rate Route Frequency Ordered Stop   07/14/15 2000  vancomycin (VANCOCIN) IVPB 1000 mg/200 mL premix     1,000 mg 200 mL/hr over 60 Minutes  Intravenous Every 24 hours 07/18/2015 1936     07/14/15 0400  piperacillin-tazobactam (ZOSYN) IVPB 3.375 g     3.375 g 12.5 mL/hr over 240 Minutes Intravenous Every 8 hours 06/24/2015 1934     07/16/2015 1815  piperacillin-tazobactam (ZOSYN) IVPB 3.375 g     3.375 g 100 mL/hr over 30 Minutes Intravenous  Once 07/19/2015 1803 07/20/2015 2023   07/10/2015 1815  vancomycin (VANCOCIN) IVPB 1000 mg/200 mL premix     1,000 mg 200 mL/hr over 60 Minutes Intravenous  Once 07/10/2015 1803 07/06/2015 2048         ROS: not obtainable due to patients delirum   Blood pressure 124/47, pulse 82, temperature 98 F (36.7 C), temperature source Axillary, resp. rate 25, height $RemoveBe'5\' 11"'[id number]$  (1.803 m), weight 165 lb 2 oz (74.9 kg), SpO2 92 %. General: awake, confused HEENT: anicteric sclera,  EOMI, oropharynx clear and without exudate Cardiovascular: irr irr   no murmur rubs or gallops Pulmonary: clear to auscultation bilaterally anteriorly, no wheezing, rales or rhonchi Gastrointestinal: soft  nondistended, normal bowel sounds, diffusely tender ? hypersethetic Musculoskeletal: he has tenderness in both upper arms, left elbow, right elbow, left wrist in particular Skin, soft tissue: mx ecchymoses, lesion on left hand  07/16/15:       Neuro: nonfocal, strength and sensation intact   Results for orders placed or performed during the hospital encounter of 07/19/2015 (from the past 48 hour(s))  Comprehensive metabolic panel     Status: Abnormal   Collection Time: 07/14/15  7:25 PM  Result Value Ref Range   Sodium 130 (L) 135 - 145 mmol/L   Potassium 3.8 3.5 - 5.1 mmol/L   Chloride 98 (L) 101 - 111 mmol/L   CO2 23 22 - 32 mmol/L   Glucose, Bld 151 (H) 65 - 99 mg/dL   BUN 35 (H) 6 - 20 mg/dL   Creatinine, Ser 1.04 0.61 - 1.24 mg/dL   Calcium 7.9 (L) 8.9 - 10.3 mg/dL   Total Protein 5.0 (L) 6.5 - 8.1 g/dL   Albumin 1.7 (L) 3.5 - 5.0 g/dL   AST 73 (H) 15 - 41 U/L   ALT 44 17 - 63 U/L   Alkaline Phosphatase 133 (H)  38 - 126 U/L   Total Bilirubin 1.3 (H) 0.3 - 1.2 mg/dL   GFR calc non Af Amer >60 >60 mL/min   GFR calc Af Amer >60 >60 mL/min    Comment: (NOTE) The eGFR has been calculated using the CKD EPI equation. This calculation has not been validated in all clinical situations. eGFR's persistently <60 mL/min signify possible Chronic Kidney Disease.    Anion gap 9 5 - 15  Procalcitonin     Status: None   Collection Time: 07/15/15  3:42 AM  Result Value Ref Range   Procalcitonin 0.48 ng/mL    Comment:        Interpretation: PCT (Procalcitonin) <= 0.5 ng/mL: Systemic infection (sepsis) is not likely. Local bacterial infection is possible. (NOTE)  ICU PCT Algorithm               Non ICU PCT Algorithm    ----------------------------     ------------------------------         PCT < 0.25 ng/mL                 PCT < 0.1 ng/mL     Stopping of antibiotics            Stopping of antibiotics       strongly encouraged.               strongly encouraged.    ----------------------------     ------------------------------       PCT level decrease by               PCT < 0.25 ng/mL       >= 80% from peak PCT       OR PCT 0.25 - 0.5 ng/mL          Stopping of antibiotics                                             encouraged.     Stopping of antibiotics           encouraged.    ----------------------------     ------------------------------       PCT level decrease by              PCT >= 0.25 ng/mL       < 80% from peak PCT        AND PCT >= 0.5 ng/mL            Continuin g antibiotics                                              encouraged.       Continuing antibiotics            encouraged.    ----------------------------     ------------------------------     PCT level increase compared          PCT > 0.5 ng/mL         with peak PCT AND          PCT >= 0.5 ng/mL             Escalation of antibiotics                                          strongly encouraged.      Escalation of  antibiotics        strongly encouraged.   CBC     Status: Abnormal   Collection Time: 07/15/15  3:42 AM  Result Value Ref Range   WBC 19.7 (H) 4.0 - 10.5 K/uL   RBC 2.54 (L) 4.22 - 5.81 MIL/uL   Hemoglobin 7.6 (L) 13.0 - 17.0 g/dL   HCT 22.8 (L) 39.0 - 52.0 %   MCV 89.8 78.0 - 100.0 fL   MCH 29.9 26.0 - 34.0 pg   MCHC 33.3 30.0 - 36.0 g/dL   RDW 20.8 (H) 11.5 -  15.5 %   Platelets 278 150 - 400 K/uL  Comprehensive metabolic panel     Status: Abnormal   Collection Time: 07/15/15  3:42 AM  Result Value Ref Range   Sodium 130 (L) 135 - 145 mmol/L   Potassium 3.6 3.5 - 5.1 mmol/L   Chloride 98 (L) 101 - 111 mmol/L   CO2 25 22 - 32 mmol/L   Glucose, Bld 101 (H) 65 - 99 mg/dL   BUN 31 (H) 6 - 20 mg/dL   Creatinine, Ser 1.01 0.61 - 1.24 mg/dL   Calcium 7.8 (L) 8.9 - 10.3 mg/dL   Total Protein 5.0 (L) 6.5 - 8.1 g/dL   Albumin 1.7 (L) 3.5 - 5.0 g/dL   AST 61 (H) 15 - 41 U/L   ALT 46 17 - 63 U/L   Alkaline Phosphatase 149 (H) 38 - 126 U/L   Total Bilirubin 1.5 (H) 0.3 - 1.2 mg/dL   GFR calc non Af Amer >60 >60 mL/min   GFR calc Af Amer >60 >60 mL/min    Comment: (NOTE) The eGFR has been calculated using the CKD EPI equation. This calculation has not been validated in all clinical situations. eGFR's persistently <60 mL/min signify possible Chronic Kidney Disease.    Anion gap 7 5 - 15  Prepare RBC     Status: None   Collection Time: 07/15/15  9:00 AM  Result Value Ref Range   Order Confirmation ORDER PROCESSED BY BLOOD BANK   CBC with Differential/Platelet     Status: Abnormal   Collection Time: 07/16/15  3:50 AM  Result Value Ref Range   WBC 27.0 (H) 4.0 - 10.5 K/uL   RBC 3.11 (L) 4.22 - 5.81 MIL/uL   Hemoglobin 9.2 (L) 13.0 - 17.0 g/dL   HCT 28.5 (L) 39.0 - 52.0 %   MCV 91.6 78.0 - 100.0 fL   MCH 29.6 26.0 - 34.0 pg   MCHC 32.3 30.0 - 36.0 g/dL   RDW 20.0 (H) 11.5 - 15.5 %   Platelets 296 150 - 400 K/uL   Neutrophils Relative % 93 %   Lymphocytes Relative 3 %    Monocytes Relative 4 %   Eosinophils Relative 0 %   Basophils Relative 0 %   nRBC 2 (H) 0 /100 WBC   Neutro Abs 25.1 (H) 1.7 - 7.7 K/uL   Lymphs Abs 0.8 0.7 - 4.0 K/uL   Monocytes Absolute 1.1 (H) 0.1 - 1.0 K/uL   Eosinophils Absolute 0.0 0.0 - 0.7 K/uL   Basophils Absolute 0.0 0.0 - 0.1 K/uL   RBC Morphology ELLIPTOCYTES    Smear Review LARGE PLATELETS PRESENT   Comprehensive metabolic panel     Status: Abnormal   Collection Time: 07/16/15  3:50 AM  Result Value Ref Range   Sodium 130 (L) 135 - 145 mmol/L   Potassium 3.9 3.5 - 5.1 mmol/L   Chloride 99 (L) 101 - 111 mmol/L   CO2 24 22 - 32 mmol/L   Glucose, Bld 101 (H) 65 - 99 mg/dL   BUN 31 (H) 6 - 20 mg/dL   Creatinine, Ser 0.94 0.61 - 1.24 mg/dL   Calcium 8.2 (L) 8.9 - 10.3 mg/dL   Total Protein 5.1 (L) 6.5 - 8.1 g/dL   Albumin 1.7 (L) 3.5 - 5.0 g/dL   AST 43 (H) 15 - 41 U/L   ALT 46 17 - 63 U/L   Alkaline Phosphatase 176 (H) 38 - 126 U/L   Total Bilirubin 1.9 (  H) 0.3 - 1.2 mg/dL   GFR calc non Af Amer >60 >60 mL/min   GFR calc Af Amer >60 >60 mL/min    Comment: (NOTE) The eGFR has been calculated using the CKD EPI equation. This calculation has not been validated in all clinical situations. eGFR's persistently <60 mL/min signify possible Chronic Kidney Disease.    Anion gap 7 5 - 15  CK     Status: Abnormal   Collection Time: 07/16/15  3:50 AM  Result Value Ref Range   Total CK 19 (L) 49 - 397 U/L  Sedimentation rate     Status: Abnormal   Collection Time: 07/16/15  3:50 AM  Result Value Ref Range   Sed Rate 104 (H) 0 - 16 mm/hr  C-reactive protein     Status: Abnormal   Collection Time: 07/16/15  3:50 AM  Result Value Ref Range   CRP 18.0 (H) <1.0 mg/dL    Comment: Performed at [facility] [place]     Status: Abnormal   Collection Time: 07/16/15  3:50 AM  Result Value Ref Range   Prothrombin Time 17.4 (H) 11.6 - 15.2 seconds   INR 1.42 0.00 - 1.49  Uric acid     Status: Abnormal   Collection  Time: 07/16/15  3:50 AM  Result Value Ref Range   Uric Acid, Serum 3.3 (L) 4.4 - 7.6 mg/dL  Glucose, capillary     Status: None   Collection Time: 07/16/15  7:44 AM  Result Value Ref Range   Glucose-Capillary 98 65 - 99 mg/dL   Comment 1 Notify RN    Comment 2 Document in Chart   Bilirubin, fractionated(tot/dir/indir)     Status: Abnormal   Collection Time: 07/16/15 11:03 AM  Result Value Ref Range   Total Bilirubin 2.4 (H) 0.3 - 1.2 mg/dL   Bilirubin, Direct 1.4 (H) 0.1 - 0.5 mg/dL   Indirect Bilirubin 1.0 (H) 0.3 - 0.9 mg/dL  Procalcitonin - Baseline     Status: None   Collection Time: 07/16/15 11:03 AM  Result Value Ref Range   Procalcitonin 1.32 ng/mL    Comment:        Interpretation: PCT > 0.5 ng/mL and <= 2 ng/mL: Systemic infection (sepsis) is possible, but other conditions are known to elevate PCT as well. (NOTE)         ICU PCT Algorithm               Non ICU PCT Algorithm    ----------------------------     ------------------------------         PCT < 0.25 ng/mL                 PCT < 0.1 ng/mL     Stopping of antibiotics            Stopping of antibiotics       strongly encouraged.               strongly encouraged.    ----------------------------     ------------------------------       PCT level decrease by               PCT < 0.25 ng/mL       >= 80% from peak PCT       OR PCT 0.25 - 0.5 ng/mL          Stopping of antibiotics  encouraged.     Stopping of antibiotics           encouraged.    ----------------------------     ------------------------------       PCT level decrease by              PCT >= 0.25 ng/mL       < 80% from peak PCT        AND PCT >= 0.5 ng/mL             Continuing antibiotics                                              encouraged.       Continuing antibiotics            encouraged.    ----------------------------     ------------------------------     PCT level increase compared          PCT  > 0.5 ng/mL         with peak PCT AND          PCT >= 0.5 ng/mL             Escalation of antibiotics                                          strongly encouraged.      Escalation of antibiotics        strongly encouraged.   Blood gas, arterial     Status: Abnormal   Collection Time: 07/16/15 11:18 AM  Result Value Ref Range   FIO2 0.55    Delivery systems VENTURI MASK    pH, Arterial 7.490 (H) 7.350 - 7.450   pCO2 arterial 31.0 (L) 35.0 - 45.0 mmHg   pO2, Arterial 66.7 (L) 80.0 - 100.0 mmHg   Bicarbonate 23.4 20.0 - 24.0 mEq/L   TCO2 21.6 0 - 100 mmol/L   Acid-Base Excess 0.8 0.0 - 2.0 mmol/L   O2 Saturation 93.1 %   Patient temperature 98.6    Collection site RIGHT RADIAL    Drawn by [phone]    Sample type ARTERIAL DRAW    Allens test (pass/fail) PASS PASS  Urinalysis, Routine w reflex microscopic (not at [facility])     Status: Abnormal   Collection Time: 07/16/15 12:05 PM  Result Value Ref Range   Color, Urine AMBER (A) YELLOW    Comment: BIOCHEMICALS MAY BE AFFECTED BY COLOR   APPearance CLOUDY (A) CLEAR   Specific Gravity, Urine 1.022 1.005 - 1.030   pH 5.5 5.0 - 8.0   Glucose, UA NEGATIVE NEGATIVE mg/dL   Hgb urine dipstick NEGATIVE NEGATIVE   Bilirubin Urine SMALL (A) NEGATIVE   Ketones, ur NEGATIVE NEGATIVE mg/dL   Protein, ur 30 (A) NEGATIVE mg/dL   Urobilinogen, UA 1.0 0.0 - 1.0 mg/dL   Nitrite NEGATIVE NEGATIVE   Leukocytes, UA NEGATIVE NEGATIVE  Urine microscopic-add on     Status: None   Collection Time: 07/16/15 12:05 PM  Result Value Ref Range   Urine-Other      NO FORMED ELEMENTS SEEN ON URINE MICROSCOPIC EXAMINATION   '@BRIEFLABTABLE'$ (sdes,specrequest,cult,reptstatus)   ) Recent Results (from the past 720 hour(s))  TECHNOLOGIST REVIEW     Status: None  Collection Time: 06/20/15  2:02 PM  Result Value Ref Range Status   Technologist Review   Final    Few hypersegmented neutrophils,large and giant plts  TECHNOLOGIST REVIEW     Status: None   Collection  Time: 07/04/15  1:49 PM  Result Value Ref Range Status   Technologist Review 2% Blasts, Few Metas and Myelos, Occ Giant Plt  Final  TECHNOLOGIST REVIEW     Status: None   Collection Time: 07/07/15 12:16 PM  Result Value Ref Range Status   Technologist Review 3%Metas and 3% Nrbcs, Rare Promyelocyte and Blast   Final  TECHNOLOGIST REVIEW     Status: None   Collection Time: 07/21/2015  1:57 PM  Result Value Ref Range Status   Technologist Review   Final    Few Metas and Myelocytes present, rare blast and nRBC, hypersegmented neutrophils  Culture, blood (routine x 2) Call MD if unable to obtain prior to antibiotics being given     Status: None (Preliminary result)   Collection Time: 07/07/2015  7:30 PM  Result Value Ref Range Status   Specimen Description BLOOD LEFT ARM  Final   Special Requests BOTTLES DRAWN AEROBIC AND ANAEROBIC  10CC  Final   Culture   Final    NO GROWTH 2 DAYS Performed at [facility]    Report Status PENDING  Incomplete  Culture, blood (routine x 2) Call MD if unable to obtain prior to antibiotics being given     Status: None (Preliminary result)   Collection Time: 07/14/2015  7:52 PM  Result Value Ref Range Status   Specimen Description BLOOD RIGHT HAND  Final   Special Requests AEROBIC BOTTLE ONLY 5ML  Final   Culture   Final    NO GROWTH 2 DAYS Performed at [facility]    Report Status PENDING  Incomplete  Urine culture     Status: None   Collection Time: 06/29/2015  9:24 PM  Result Value Ref Range Status   Specimen Description URINE, CLEAN CATCH  Final   Special Requests NONE  Final   Culture   Final    NO GROWTH 1 DAY Performed at [facility]    Report Status 07/15/2015 FINAL  Final     Impression/Recommendation  Principal Problem:   Atrial fibrillation with RVR (HCC) Active Problems:   Essential thrombocytosis (HCC)   Hypothyroidism   Absolute anemia   CKD (chronic kidney disease) stage 2, GFR 60-89 ml/min   Acute on  chronic diastolic HF (heart failure) (HCC)   General weakness   GERD (gastroesophageal reflux disease)   Myelodysplastic syndrome (HCC)   Protein-calorie malnutrition, severe (HCC)   Decreased movement of arm   SOB (shortness of breath)   Leukocytosis   SIRS (systemic inflammatory response syndrome) (HCC)   Sepsis (HCC)   FTT (failure to thrive) in adult   Encounter for palliative care   Jack Schultz is a 81 y.o. male with  Months of feeling poorly, weight loss, falls, left arm, elbow wrist pain, and now pain in opposite arm. He has hx of myeloproliferative disorder and has persistent leukocytosis and now splenomegaly on CT along with persistent pleural effusions  #1 FUO, Leukocytosis of unknown cause:  --agree with bone marrow biopsy tomorrow. Would also send for AFB and fungal cultures in addition to pathology --I will obtain a CT of his left arm with contrast to look for evidence of deep infection, effusions there --I will stop his vancomycin tonight  and also his zosyn --I will order CMV, EBV serologies, Hep serologies, HIV, ESR, CRP, CPK, RF, ANA SSA/SSB  Could this be an odd presentation of PMR or other CTD from which steroids might benefit him.   We first need to exclude bacterial infection and also look for malignancy but I do wonder about therapeutic trial of such a drug  I would also consider biopsy of lesion on left hand and TEE  Goals of care: Would agree with continued discussions of goals of care  I spent greater than 80 minutes with the patient including greater than 50% of time in face to face counsel of the patient and his daughter re his FUO, leukocytosis, left arm, pain, FTT, skin lesion  and in coordination of their care.  Dr. Linus Salmons to take over the service tomorrow.   07/16/2015, 6:21 PM   Thank you so much for this interesting consult  [place] [facility] [place] [phone] (pager) [phone] (office) 07/16/2015,  6:21 PM  Rhina [place] 07/16/2015, 6:21 PM

## 2015-07-17 ENCOUNTER — Inpatient Hospital Stay (HOSPITAL_COMMUNITY): Payer: Medicare Other

## 2015-07-17 ENCOUNTER — Encounter (HOSPITAL_COMMUNITY): Payer: Self-pay | Admitting: Radiology

## 2015-07-17 DIAGNOSIS — I878 Other specified disorders of veins: Secondary | ICD-10-CM

## 2015-07-17 DIAGNOSIS — R4182 Altered mental status, unspecified: Secondary | ICD-10-CM

## 2015-07-17 DIAGNOSIS — I5033 Acute on chronic diastolic (congestive) heart failure: Secondary | ICD-10-CM

## 2015-07-17 DIAGNOSIS — R0902 Hypoxemia: Secondary | ICD-10-CM

## 2015-07-17 DIAGNOSIS — D469 Myelodysplastic syndrome, unspecified: Secondary | ICD-10-CM

## 2015-07-17 DIAGNOSIS — J9690 Respiratory failure, unspecified, unspecified whether with hypoxia or hypercapnia: Secondary | ICD-10-CM

## 2015-07-17 LAB — CBC WITH DIFFERENTIAL/PLATELET
Band Neutrophils: 3 %
Basophils Absolute: 0 10*3/uL (ref 0.0–0.1)
Basophils Relative: 0 %
EOS PCT: 0 %
Eosinophils Absolute: 0 10*3/uL (ref 0.0–0.7)
HCT: 26.1 % — ABNORMAL LOW (ref 39.0–52.0)
Hemoglobin: 8.5 g/dL — ABNORMAL LOW (ref 13.0–17.0)
Lymphocytes Relative: 4 %
Lymphs Abs: 1.3 10*3/uL (ref 0.7–4.0)
MCH: 29.6 pg (ref 26.0–34.0)
MCHC: 32.6 g/dL (ref 30.0–36.0)
MCV: 90.9 fL (ref 78.0–100.0)
MONO ABS: 1.3 10*3/uL — AB (ref 0.1–1.0)
MONOS PCT: 4 %
MYELOCYTES: 3 %
Metamyelocytes Relative: 2 %
NEUTROS PCT: 84 %
Neutro Abs: 29.8 10*3/uL — ABNORMAL HIGH (ref 1.7–7.7)
PLATELETS: 292 10*3/uL (ref 150–400)
RBC: 2.87 MIL/uL — AB (ref 4.22–5.81)
RDW: 19.7 % — AB (ref 11.5–15.5)
WBC: 32.4 10*3/uL — AB (ref 4.0–10.5)

## 2015-07-17 LAB — HIV ANTIBODY (ROUTINE TESTING W REFLEX): HIV Screen 4th Generation wRfx: NONREACTIVE

## 2015-07-17 LAB — COMPREHENSIVE METABOLIC PANEL
ALBUMIN: 1.6 g/dL — AB (ref 3.5–5.0)
ALT: 32 U/L (ref 17–63)
AST: 26 U/L (ref 15–41)
Alkaline Phosphatase: 128 U/L — ABNORMAL HIGH (ref 38–126)
Anion gap: 9 (ref 5–15)
BUN: 38 mg/dL — AB (ref 6–20)
CHLORIDE: 99 mmol/L — AB (ref 101–111)
CO2: 24 mmol/L (ref 22–32)
Calcium: 8.1 mg/dL — ABNORMAL LOW (ref 8.9–10.3)
Creatinine, Ser: 1.35 mg/dL — ABNORMAL HIGH (ref 0.61–1.24)
GFR calc Af Amer: 52 mL/min — ABNORMAL LOW (ref >60)
GFR calc non Af Amer: 45 mL/min — ABNORMAL LOW (ref >60)
GLUCOSE: 128 mg/dL — AB (ref 65–99)
POTASSIUM: 3.9 mmol/L (ref 3.5–5.1)
Sodium: 132 mmol/L — ABNORMAL LOW (ref 135–145)
Total Bilirubin: 1.9 mg/dL — ABNORMAL HIGH (ref 0.3–1.2)
Total Protein: 5.1 g/dL — ABNORMAL LOW (ref 6.5–8.1)

## 2015-07-17 LAB — TYPE AND SCREEN
ABO/RH(D): B NEG
Antibody Screen: NEGATIVE
UNIT DIVISION: 0
UNIT DIVISION: 0
UNIT DIVISION: 0

## 2015-07-17 LAB — FERRITIN: Ferritin: 1731 ng/mL — ABNORMAL HIGH (ref 24–336)

## 2015-07-17 LAB — CK: Total CK: 9 U/L — ABNORMAL LOW (ref 49–397)

## 2015-07-17 LAB — MAGNESIUM: MAGNESIUM: 2.2 mg/dL (ref 1.7–2.4)

## 2015-07-17 LAB — ANTINUCLEAR ANTIBODIES, IFA: ANTINUCLEAR ANTIBODIES, IFA: NEGATIVE

## 2015-07-17 LAB — HEPATITIS PANEL, ACUTE
HEP A IGM: POSITIVE — AB
HEP B S AG: NEGATIVE
Hep B C IgM: NEGATIVE

## 2015-07-17 LAB — SEDIMENTATION RATE: SED RATE: 120 mm/h — AB (ref 0–16)

## 2015-07-17 LAB — LACTATE DEHYDROGENASE: LDH: 144 U/L (ref 98–192)

## 2015-07-17 LAB — PROCALCITONIN: PROCALCITONIN: 1.91 ng/mL

## 2015-07-17 LAB — C-REACTIVE PROTEIN: CRP: 26.4 mg/dL — AB (ref <1.0)

## 2015-07-17 LAB — RPR: RPR Ser Ql: NONREACTIVE

## 2015-07-17 MED ORDER — IOHEXOL 300 MG/ML  SOLN
100.0000 mL | Freq: Once | INTRAMUSCULAR | Status: AC | PRN
  Administered 2015-07-17: 100 mL via INTRAVENOUS

## 2015-07-17 NOTE — Progress Notes (Signed)
IP PROGRESS NOTE  Subjective:   He continues to complain of pain. His daughter loss at the bedside. He has developed respiratory failure. He developed a fever over the weekend. Objective: Vital signs in last 24 hours: Blood pressure 119/43, pulse 75, temperature 97.8 F (36.6 C), temperature source Oral, resp. rate 31, height $RemoveBe'5\' 11"'dqgGcFcHl$  (1.803 m), weight 164 lb 7.4 oz (74.6 kg), SpO2 96 %.  Intake/Output from previous day: 10/23 0701 - 10/24 0700 In: 180 [P.O.:120; I.V.:10; IV Piggyback:50] Out: 2215 [Urine:2215]  Physical Exam:  Abdomen: No hepatosplenomegaly, nontender  Musculoskeletal: Pain with motion at the upper and lower arms, tender over the upper arm bilaterally  Portacath/PICC-without erythema  Lab Results:  Recent Labs  07/16/15 1938 07/17/15 0150  WBC 31.1* 32.4*  HGB 8.7* 8.5*  HCT 26.5* 26.1*  PLT 290 292    BMET  Recent Labs  07/16/15 0350 07/17/15 0150  NA 130* 132*  K 3.9 3.9  CL 99* 99*  CO2 24 24  GLUCOSE 101* 128*  BUN 31* 38*  CREATININE 0.94 1.35*  CALCIUM 8.2* 8.1*    Studies/Results: Ct Angio Chest Pe W/cm &/or Wo Cm  07/16/2015  CLINICAL DATA:  Shortness of breath.  Generalized malaise. EXAM: CT ANGIOGRAPHY CHEST CT ABDOMEN AND PELVIS WITH CONTRAST TECHNIQUE: Multidetector CT imaging of the chest was performed using the standard protocol during bolus administration of intravenous contrast. Multiplanar CT image reconstructions and MIPs were obtained to evaluate the vascular anatomy. Multidetector CT imaging of the abdomen and pelvis was performed using the standard protocol during bolus administration of intravenous contrast. CONTRAST:  134mL OMNIPAQUE IOHEXOL 350 MG/ML SOLN COMPARISON:  CT scan of abdomen pelvis of June 08, 2015. FINDINGS: CTA CHEST FINDINGS No pneumothorax is noted. Moderate bilateral pleural effusions are noted with right greater than left. Adjacent subsegmental atelectasis is noted. Fluid is noted in the left major  fissure. There is no definite evidence of large central pulmonary embolus, but smaller peripheral pulmonary emboli cannot be excluded in the lower lobe branches due to respiratory motion artifact. Atherosclerosis of thoracic aorta is noted without aneurysm or dissection. Great vessels are widely patent. No significant mediastinal mass or adenopathy is noted. Coronary artery calcifications are noted. Multilevel degenerative disc disease is noted in the lower thoracic spine. CT ABDOMEN and PELVIS FINDINGS Multilevel degenerative disc disease is noted in the lumbar spine. No gallstones are noted. The liver and pancreas appear normal. Moderate splenomegaly is noted. Rounded low density is noted in inferior portion of the spleen most consistent with hemangioma. Adrenal glands and kidneys appear normal. No hydronephrosis or renal obstruction is noted. Atherosclerosis of abdominal aorta is noted without aneurysm formation. There is no evidence of bowel obstruction. Urinary bladder appears normal. Prostate gland appears grossly normal. Mild anasarca is noted. No abnormal fluid collection is noted. No significant adenopathy is noted. Review of the MIP images confirms the above findings. IMPRESSION: Moderate bilateral pleural effusions are noted with adjacent subsegmental atelectasis, right greater than left. No evidence of large central pulmonary embolus. Smaller peripheral pulmonary emboli cannot be excluded on the basis of this exam due to respiratory motion artifact. Atherosclerosis of thoracic aorta is noted without aneurysm or dissection. Moderate splenomegaly is noted. Atherosclerosis of abdominal aorta is noted without aneurysm formation. Mild anasarca. Electronically Signed   By: Marijo Conception, M.D.   On: 07/16/2015 15:47   Ct Abdomen Pelvis W Contrast  07/16/2015  CLINICAL DATA:  Shortness of breath.  Generalized malaise. EXAM: CT ANGIOGRAPHY  CHEST CT ABDOMEN AND PELVIS WITH CONTRAST TECHNIQUE: Multidetector  CT imaging of the chest was performed using the standard protocol during bolus administration of intravenous contrast. Multiplanar CT image reconstructions and MIPs were obtained to evaluate the vascular anatomy. Multidetector CT imaging of the abdomen and pelvis was performed using the standard protocol during bolus administration of intravenous contrast. CONTRAST:  165mL OMNIPAQUE IOHEXOL 350 MG/ML SOLN COMPARISON:  CT scan of abdomen pelvis of June 08, 2015. FINDINGS: CTA CHEST FINDINGS No pneumothorax is noted. Moderate bilateral pleural effusions are noted with right greater than left. Adjacent subsegmental atelectasis is noted. Fluid is noted in the left major fissure. There is no definite evidence of large central pulmonary embolus, but smaller peripheral pulmonary emboli cannot be excluded in the lower lobe branches due to respiratory motion artifact. Atherosclerosis of thoracic aorta is noted without aneurysm or dissection. Great vessels are widely patent. No significant mediastinal mass or adenopathy is noted. Coronary artery calcifications are noted. Multilevel degenerative disc disease is noted in the lower thoracic spine. CT ABDOMEN and PELVIS FINDINGS Multilevel degenerative disc disease is noted in the lumbar spine. No gallstones are noted. The liver and pancreas appear normal. Moderate splenomegaly is noted. Rounded low density is noted in inferior portion of the spleen most consistent with hemangioma. Adrenal glands and kidneys appear normal. No hydronephrosis or renal obstruction is noted. Atherosclerosis of abdominal aorta is noted without aneurysm formation. There is no evidence of bowel obstruction. Urinary bladder appears normal. Prostate gland appears grossly normal. Mild anasarca is noted. No abnormal fluid collection is noted. No significant adenopathy is noted. Review of the MIP images confirms the above findings. IMPRESSION: Moderate bilateral pleural effusions are noted with adjacent  subsegmental atelectasis, right greater than left. No evidence of large central pulmonary embolus. Smaller peripheral pulmonary emboli cannot be excluded on the basis of this exam due to respiratory motion artifact. Atherosclerosis of thoracic aorta is noted without aneurysm or dissection. Moderate splenomegaly is noted. Atherosclerosis of abdominal aorta is noted without aneurysm formation. Mild anasarca. Electronically Signed   By: Marijo Conception, M.D.   On: 07/16/2015 15:47   Dg Chest Port 1 View  07/16/2015  CLINICAL DATA:  Dyspnea, LEFT side pain, flank pain, hypertension, stage II chronic kidney disease, history atrial fibrillation EXAM: PORTABLE CHEST 1 VIEW COMPARISON:  Portable exam 0911 hours compared to 06/29/2015 FINDINGS: Enlargement of cardiac silhouette. Atherosclerotic calcification aorta. Diffuse pulmonary infiltrates and probable RIGHT pleural effusion increased since previous exam. No pneumothorax. Bones demineralized with advanced BILATERAL glenohumeral degenerative changes and BILATERAL chronic rotator cuff tears. IMPRESSION: Enlargement of cardiac silhouette. Increased BILATERAL pulmonary infiltrates and RIGHT pleural effusion, question CHF. Electronically Signed   By: Lavonia Dana M.D.   On: 07/16/2015 09:50   US Abdomen Limited Ruq  07/16/2015  CLINICAL DATA:  Elevated LFTs. EXAM: US ABDOMEN LIMITED - RIGHT UPPER QUADRANT COMPARISON:  CT the abdomen 06/08/2015. Abdominal ultrasound 05/31/2015. FINDINGS: Gallbladder: No gallstones or wall thickening visualized. No sonographic Murphy sign noted. Gallbladder wall thickness is within normal limits a 3.0 mm. Common bile duct: Diameter: 2.0 mm, within normal limits Liver: The right lobe is somewhat prominent. Finding suggests cirrhosis. Focal lesions are present. A right pleural effusion is again noted. Trace abdominal ascites is also evident. IMPRESSION: 1. Suggestion of hepatic cirrhosis. 2. Right pleural effusion. 3. Trace abdominal  ascites. 4. The gallbladder and common bile duct are within normal limits. Electronically Signed   By: San Morelle M.D.   On:  07/16/2015 12:04   Ct Extrem Up Entire Arm L W/cm  07/17/2015  CLINICAL DATA:  Left arm pain. Hand blisters. No known injury. Initial encounter. EXAM: CT OF THE UPPER LEFT EXTREMITY WITH CONTRAST TECHNIQUE: Multi detector CT imaging of the left upper extremity from shoulder through the hand was performed without intravenous contrast. Multiplanar reformatted images were generated. CONTRAST:  173mL OMNIPAQUE IOHEXOL 300 MG/ML  SOLN COMPARISON:  Chest CT 07/16/2015. Left shoulder radiographs 05/31/2015. FINDINGS: There is no evidence of acute fracture or dislocation. Severe glenohumeral degenerative changes are present with joint space loss, osteophytes and multiple intra-articular loose bodies. The subacromial space of the left shoulder is obliterated consistent with a chronic rotator cuff tear. There is erosion of the undersurface of the acromion. There are relatively mild degenerative changes at the left elbow with spurring. There is no large elbow joint effusion. There are mild degenerative changes within the wrist and hand. No focal soft tissue mass, fluid collection or abnormal enhancement identified. There is mild edema throughout the subcutaneous fat of the left upper extremity. Scattered vascular calcifications are noted. IMPRESSION: 1. No acute findings or explanation for the patient's symptoms. 2. Chronic rotator cuff tear with obliteration of the subacromial space, glenohumeral arthropathy and multiple intra-articular loose bodies. 3. Mild subcutaneous edema throughout the left upper extremity. No focal soft tissue abnormalities observed. Electronically Signed   By: Richardean Sale M.D.   On: 07/17/2015 13:06    Medications: I have reviewed the patient's current medications.  Assessment/Plan:  1. Essential thrombocytosis-maintained on hydroxyurea for years .    2. Leukocytosis-secondary to the myeloproliferative disorder versus a systemic infection  3. History of Pericarditis  4. Sepsis syndrome, probable pneumonia September 2016  5. Anemia secondary to the underlying myeloproliferative disorder, hydroxyurea, and potentially chronic renal failure, status post red cell transfusion 07/05/2015 (1 unit) and 07/07/2015 (1 unit)  6. Chronic renal failure  7. History of recurrent epistaxis secondary to platelet dysfunction  8. Rapid atrial fibrillation  9. Gait disturbance status post recent evaluation by neurology.  10. Muscle/bone pain  11. FUO   12. Respiratory failure  Mr. Hendrickson remains ill with fever, respiratory failure, altered mental status, and pain. It is possible the long-standing myeloproliferative disorder has evolved toward myelodysplasia or a preleukemic state, but this would not explain the entire clinical picture. The family would like to proceed with a bone marrow biopsy together further information. We will plan for a diagnostic bone marrow biopsy to assess for myelodysplasia, myelofibrosis, progression toward leukemia, or an infectious process if his performance status will allow this on 07/18/2015.Marland Kitchen  Recommendations:  1. continue supportive care measures 2. Agree with palliative care consultation 3. Proceed with diagnostic bone marrow biopsy in radiology if his performance status is adequate on 07/18/2015.     LOS: 4 days   [place]  07/17/2015, 4:05 PM

## 2015-07-17 NOTE — Progress Notes (Signed)
PT Cancellation Note  Patient Details Name: Jack Schultz MRN: [id number] DOB: September 11, 1924   Cancelled Treatment:    Reason Eval/Treat Not Completed: Medical issues which prohibited therapy (RN recommended to hold PT due to significant pain that patient is experiencing. chart/notes reviewed.. may  consider discontinuing  PT due to medical status .)   [name] 07/17/2015, 3:08 PM Tresa Endo PT [phone]

## 2015-07-17 NOTE — Progress Notes (Addendum)
PROGRESS NOTE  Jack Schultz WNU:[id number] DOB: 01-09-1924 DOA: 07/18/2015 PCP: [name], MD  HPI/Recap of past 24 hours:  More alert and in good spirit this am, still confused,  Currently denies pain, no cough, on nasal cannula, heart rate better, daughter in  [place] room, reported patient was very confused earlier this am, but now much better,   Assessment/Plan: Principal Problem:   Atrial fibrillation with RVR ([place]) Active Problems:   Essential thrombocytosis (HCC)   Hypothyroidism   Absolute anemia   CKD (chronic kidney disease) stage 2, GFR 60-89 ml/min   Acute on chronic diastolic HF (heart failure) (HCC)   General weakness   GERD (gastroesophageal reflux disease)   Myelodysplastic syndrome (HCC)   Protein-calorie malnutrition, severe (HCC)   Decreased movement of arm   SOB (shortness of breath)   Leukocytosis   SIRS (systemic inflammatory response syndrome) ([place])   Sepsis ([place])   FTT (failure to thrive) in adult   Encounter for palliative care   Elbow pain   LFT elevation   FUO (fever of unknown origin)   Myalgia and myositis  Hypoxia:  repeat cxr bilateral infiltrate, CTA no PE, +bialteral pleural effusion. Improved on lasix, continue lasix.  Monitor creatinine, appreicte critical care input.  Fever, ongoing intermittent fever from august per family. worsening of leukocytosis while vanc and zosyn, so far no clear source identified. Ct chest ab/pel +moderate bilateral pleural effusion which has been present during last hospitalization.  Infectious disease input appreciated, recommended d/c all abx for now, check for atypical infection including TB/viral/rheumatoid disease. Monitor fever curve.  Left arm edema: venous ultrasound preliminary report no DVT.  Right arm edema: resistance met during picc line placement yesterday on 10/25, venous US pending.  Blisters/violacious skin rash mostly upper extremity and hands:  from fluids over load? Drug  reaction? leukemia infiltrates? Vasculitis? Bone marrow tomorrow, vasculitis lab pending, ? biopsy skin lesions. Ongoing goal of care discussion with family.  Leukocytosis with sepsis, dehydration and underline myeloproliferative/myelodysplastic syndrome, few blast cells on peripheral blood. oncology consulted by admitting MD. Bone marrow biopsy 10/25.  Acute on chronic Anemia, likely from MDS, supportive prbc transfusion x1 10/22, last transfusion on 10/20. ( has required repeated blood transfusion since September 2016).  Afib/RVR with h/o heart block during hospitalization a month ago. Currently on asa 38m and metoprolol, not a candidate for anticoagulation due to h/o myeloproliferative/? myeolodysplastic syndrome with h/o significant epistaxis. Not a candidate for pacemaker, Heart rate better, Cardiology signed off.  FTT: with recent repeated admissions to the hospital, likely will need SNF if patient able to improve from current condition  Overall poor prognosis, family reported patient has 30pounds to 40pounds of weight loss this year, multiple falls and started needing frequent blood transfusion, family (daughter in law) expressed the desire to talk to palliative care on 10/22, palliative care service consulted. Daughter expressed wishes on 10/23 to do more tests.  Code Status: full  Family Communication: patient and daughter in law  Disposition Plan: remain in stepdown   Consultants:  Cardiology (signed off)  Oncology Dr. SBenay Spice Infectious disease  Critical care  Procedures:  none  Antibiotics:  Vanc/zosyn from admission to 10/23.   Objective: BP 127/37 mmHg  Pulse 77  Temp(Src) 97.8 F (36.6 C) (Oral)  Resp 18  Ht 5' 11" (1.803 m)  Wt 164 lb 7.4 oz (74.6 kg)  BMI 22.95 kg/m2  SpO2 91%  Intake/Output Summary (Last 24 hours) at 07/17/15 1631 Last data filed  at 07/17/15 1600  Gross per 24 hour  Intake    470 ml  Output   2050 ml  Net  -1580 ml    Filed Weights   07/14/15 0443 07/16/15 0752 07/17/15 0500  Weight: 163 lb 9.3 oz (74.2 kg) 165 lb 2 oz (74.9 kg) 164 lb 7.4 oz (74.6 kg)    Exam:   General:  Frail, but seems in a good spirit today, smiling during encounter  Cardiovascular: IRRR  Respiratory: diminished, less crackles, no wheezing  Abdomen: Soft/ND/NT, positive BS,   Musculoskeletal: No Edema, left shoulder pain, limited range of motion, per family this is chronic  Skin: violaceous rash with few blisters (less than 1cm in diameters) in bilateral upper extremities. Does not seem to be tender, does not itch.   Neuro: not oriented to time, confuse  Data Reviewed: Basic Metabolic Panel:  Recent Labs Lab 07/09/2015 1833 07/14/15 0205 07/14/15 1925 07/15/15 0342 07/16/15 0350 07/17/15 0150  NA 130* 129* 130* 130* 130* 132*  K 3.9 3.9 3.8 3.6 3.9 3.9  CL 95* 96* 98* 98* 99* 99*  CO2 _0 GLUCOSE 126* 115* 151* 101* 101* 128*  BUN 34* 32* 35* 31* 31* 38*  CREATININE 1.18 1.02 1.04 1.01 0.94 1.35*  CALCIUM 8.0* 7.7* 7.9* 7.8* 8.2* 8.1*  MG 2.0  --   --   --   --  2.2   Liver Function Tests:  Recent Labs Lab 07/15/2015 1833 07/14/15 1925 07/15/15 0342 07/16/15 0350 07/16/15 1103 07/17/15 0150  AST 41 73* 61* 43*  --  26  ALT 33 44 46 46  --  32  ALKPHOS 130* 133* 149* 176*  --  128*  BILITOT 1.4* 1.3* 1.5* 1.9* 2.4* 1.9*  PROT 5.7* 5.0* 5.0* 5.1*  --  5.1*  ALBUMIN 2.0* 1.7* 1.7* 1.7*  --  1.6*   No results for input(s): LIPASE, AMYLASE in the last 168 hours. No results for input(s): AMMONIA in the last 168 hours. CBC:  Recent Labs Lab 07/12/2015 1357 07/14/15 0205 07/15/15 0342 07/16/15 0350 07/16/15 1938 07/17/15 0150  WBC 29.6* 20.1* 19.7* 27.0* 31.1* 32.4*  NEUTROABS 27.7*  --   --  25.1* 28.3* 29.8*  HGB 7.9* 8.9* 7.6* 9.2* 8.7* 8.5*  HCT 25.3* 26.7* 22.8* 28.5* 26.5* 26.1*  MCV 90.2 88.7 89.8 91.6 91.7 90.9  PLT 405* 273 278 296 290 292   Cardiac Enzymes:     Recent Labs Lab 07/07/2015 1833 07/14/15 0205 07/14/15 0843 07/16/15 0350 07/17/15 0150  CKTOTAL  --   --   --  19* 9*  TROPONINI 0.03 <0.03 <0.03  --   --    BNP (last 3 results)  Recent Labs  06/06/15 0512 07/17/2015 1833  BNP 425.4* 470.8*    ProBNP (last 3 results) No results for input(s): PROBNP in the last 8760 hours.  CBG:  Recent Labs Lab 07/16/15 0744  GLUCAP 98    Recent Results (from the past 240 hour(s))  TECHNOLOGIST REVIEW     Status: None   Collection Time: 07/04/2015  1:57 PM  Result Value Ref Range Status   Technologist Review   Final    Few Metas and Myelocytes present, rare blast and nRBC, hypersegmented neutrophils  Culture, blood (routine x 2) Call MD if unable to obtain prior to antibiotics being given     Status: None (Preliminary result)   Collection Time: 07/10/2015  7:30 PM  Result Value Ref Range Status  Specimen Description BLOOD LEFT ARM  Final   Special Requests BOTTLES DRAWN AEROBIC AND ANAEROBIC  10CC  Final   Culture   Final    NO GROWTH 3 DAYS Performed at [facility]    Report Status PENDING  Incomplete  Culture, blood (routine x 2) Call MD if unable to obtain prior to antibiotics being given     Status: None (Preliminary result)   Collection Time: 06/30/2015  7:52 PM  Result Value Ref Range Status   Specimen Description BLOOD RIGHT HAND  Final   Special Requests AEROBIC BOTTLE ONLY 5ML  Final   Culture   Final    NO GROWTH 3 DAYS Performed at [facility]    Report Status PENDING  Incomplete  Urine culture     Status: None   Collection Time: 07/04/2015  9:24 PM  Result Value Ref Range Status   Specimen Description URINE, CLEAN CATCH  Final   Special Requests NONE  Final   Culture   Final    NO GROWTH 1 DAY Performed at [facility]    Report Status 07/15/2015 FINAL  Final  Culture, blood (routine x 2)     Status: None (Preliminary result)   Collection Time: 07/16/15 11:03 AM  Result Value Ref Range  Status   Specimen Description BLOOD LEFT ANTECUBITAL  Final   Special Requests BOTTLES DRAWN AEROBIC AND ANAEROBIC 10CC  Final   Culture   Final    NO GROWTH < 24 HOURS Performed at [facility]    Report Status PENDING  Incomplete  Culture, blood (routine x 2)     Status: None (Preliminary result)   Collection Time: 07/16/15 11:10 AM  Result Value Ref Range Status   Specimen Description BLOOD LEFT HAND  Final   Special Requests BOTTLES DRAWN AEROBIC AND ANAEROBIC 10CC  Final   Culture   Final    NO GROWTH < 24 HOURS Performed at [facility]    Report Status PENDING  Incomplete     Studies: Ct Extrem Up Entire Arm L W/cm  07/17/2015  CLINICAL DATA:  Left arm pain. Hand blisters. No known injury. Initial encounter. EXAM: CT OF THE UPPER LEFT EXTREMITY WITH CONTRAST TECHNIQUE: Multi detector CT imaging of the left upper extremity from shoulder through the hand was performed without intravenous contrast. Multiplanar reformatted images were generated. CONTRAST:  142m OMNIPAQUE IOHEXOL 300 MG/ML  SOLN COMPARISON:  Chest CT 07/16/2015. Left shoulder radiographs 05/31/2015. FINDINGS: There is no evidence of acute fracture or dislocation. Severe glenohumeral degenerative changes are present with joint space loss, osteophytes and multiple intra-articular loose bodies. The subacromial space of the left shoulder is obliterated consistent with a chronic rotator cuff tear. There is erosion of the undersurface of the acromion. There are relatively mild degenerative changes at the left elbow with spurring. There is no large elbow joint effusion. There are mild degenerative changes within the wrist and hand. No focal soft tissue mass, fluid collection or abnormal enhancement identified. There is mild edema throughout the subcutaneous fat of the left upper extremity. Scattered vascular calcifications are noted. IMPRESSION: 1. No acute findings or explanation for the patient's symptoms. 2.  Chronic rotator cuff tear with obliteration of the subacromial space, glenohumeral arthropathy and multiple intra-articular loose bodies. 3. Mild subcutaneous edema throughout the left upper extremity. No focal soft tissue abnormalities observed. Electronically Signed   By: WRichardean SaleM.D.   On: 07/17/2015 13:06    Scheduled Meds: .  sodium chloride  250 mL Intravenous Once  . antiseptic oral rinse  7 mL Mouth Rinse BID  . aspirin  81 mg Oral Daily  . chlorhexidine  15 mL Mouth Rinse BID  . feeding supplement (ENSURE ENLIVE)  237 mL Oral TID BM  . ferrous sulfate  325 mg Oral Q breakfast  . furosemide  40 mg Intravenous Daily  . levothyroxine  50 mcg Oral QAC breakfast  . metoprolol  5 mg Intravenous Q8H  . potassium chloride SA  20 mEq Oral Daily  . sodium chloride  3 mL Intravenous Q12H    Continuous Infusions:     Time spent: 57mns  , MD, PhD  [facility] Pager [phone] If 7PM-7AM, please contact night-coverage at [email or web address], password [email or web address] Center10/24/2016, 4:31 PM  LOS: 4 days

## 2015-07-17 NOTE — Progress Notes (Signed)
[facility]   Date of Admission:  07/01/2015  Antibiotics: none  Subjective: Some more responsiveness this am  Objective: Temp:  [97.9 F (36.6 C)-100.7 F (38.2 C)] 98.4 F (36.9 C) (10/24 0800) Pulse Rate:  [73-133] 94 (10/24 0800) Resp:  [20-34] 25 (10/24 0800) BP: (94-126)/(39-54) 100/54 mmHg (10/24 0800) SpO2:  [86 %-95 %] 94 % (10/24 0800) Weight:  [164 lb 7.4 oz (74.6 kg)] 164 lb 7.4 oz (74.6 kg) (10/24 0500)  General: arousable, on nasal cannula, no acute distress Skin: some blood blister areas on arms, unchanged, not on legs Lungs: CTA B, diminshed at bases Cor: RRR Abdomen: soft, nt Ext: no edema  Review of systems not obtained due to patient factors.  Lab Results Lab Results  Component Value Date   WBC 32.4* 07/17/2015   HGB 8.5* 07/17/2015   HCT 26.1* 07/17/2015   MCV 90.9 07/17/2015   PLT 292 07/17/2015    Lab Results  Component Value Date   CREATININE 1.35* 07/17/2015   BUN 38* 07/17/2015   NA 132* 07/17/2015   K 3.9 07/17/2015   CL 99* 07/17/2015   CO2 24 07/17/2015    Lab Results  Component Value Date   ALT 32 07/17/2015   AST 26 07/17/2015   ALKPHOS 128* 07/17/2015   BILITOT 1.9* 07/17/2015      Microbiology: Recent Results (from the past 240 hour(s))  TECHNOLOGIST REVIEW     Status: None   Collection Time: 07/07/15 12:16 PM  Result Value Ref Range Status   Technologist Review 3%Metas and 3% Nrbcs, Rare Promyelocyte and Blast   Final  TECHNOLOGIST REVIEW     Status: None   Collection Time: 06/29/2015  1:57 PM  Result Value Ref Range Status   Technologist Review   Final    Few Metas and Myelocytes present, rare blast and nRBC, hypersegmented neutrophils  Culture, blood (routine x 2) Call MD if unable to obtain prior to antibiotics being given     Status: None (Preliminary result)   Collection Time: 06/28/2015  7:30 PM  Result Value Ref Range Status   Specimen Description BLOOD LEFT ARM  Final   Special  Requests BOTTLES DRAWN AEROBIC AND ANAEROBIC  10CC  Final   Culture   Final    NO GROWTH 2 DAYS Performed at [facility]    Report Status PENDING  Incomplete  Culture, blood (routine x 2) Call MD if unable to obtain prior to antibiotics being given     Status: None (Preliminary result)   Collection Time: 07/04/2015  7:52 PM  Result Value Ref Range Status   Specimen Description BLOOD RIGHT HAND  Final   Special Requests AEROBIC BOTTLE ONLY 5ML  Final   Culture   Final    NO GROWTH 2 DAYS Performed at [facility]    Report Status PENDING  Incomplete  Urine culture     Status: None   Collection Time: 07/10/2015  9:24 PM  Result Value Ref Range Status   Specimen Description URINE, CLEAN CATCH  Final   Special Requests NONE  Final   Culture   Final    NO GROWTH 1 DAY Performed at [facility]    Report Status 07/15/2015 FINAL  Final    Studies/Results: Ct Angio Chest Pe W/cm &/or Wo Cm  07/16/2015  CLINICAL DATA:  Shortness of breath.  Generalized malaise. EXAM: CT ANGIOGRAPHY CHEST CT ABDOMEN AND PELVIS WITH CONTRAST TECHNIQUE: Multidetector CT  imaging of the chest was performed using the standard protocol during bolus administration of intravenous contrast. Multiplanar CT image reconstructions and MIPs were obtained to evaluate the vascular anatomy. Multidetector CT imaging of the abdomen and pelvis was performed using the standard protocol during bolus administration of intravenous contrast. CONTRAST:  162mL OMNIPAQUE IOHEXOL 350 MG/ML SOLN COMPARISON:  CT scan of abdomen pelvis of June 08, 2015. FINDINGS: CTA CHEST FINDINGS No pneumothorax is noted. Moderate bilateral pleural effusions are noted with right greater than left. Adjacent subsegmental atelectasis is noted. Fluid is noted in the left major fissure. There is no definite evidence of large central pulmonary embolus, but smaller peripheral pulmonary emboli cannot be excluded in the lower lobe branches due  to respiratory motion artifact. Atherosclerosis of thoracic aorta is noted without aneurysm or dissection. Great vessels are widely patent. No significant mediastinal mass or adenopathy is noted. Coronary artery calcifications are noted. Multilevel degenerative disc disease is noted in the lower thoracic spine. CT ABDOMEN and PELVIS FINDINGS Multilevel degenerative disc disease is noted in the lumbar spine. No gallstones are noted. The liver and pancreas appear normal. Moderate splenomegaly is noted. Rounded low density is noted in inferior portion of the spleen most consistent with hemangioma. Adrenal glands and kidneys appear normal. No hydronephrosis or renal obstruction is noted. Atherosclerosis of abdominal aorta is noted without aneurysm formation. There is no evidence of bowel obstruction. Urinary bladder appears normal. Prostate gland appears grossly normal. Mild anasarca is noted. No abnormal fluid collection is noted. No significant adenopathy is noted. Review of the MIP images confirms the above findings. IMPRESSION: Moderate bilateral pleural effusions are noted with adjacent subsegmental atelectasis, right greater than left. No evidence of large central pulmonary embolus. Smaller peripheral pulmonary emboli cannot be excluded on the basis of this exam due to respiratory motion artifact. Atherosclerosis of thoracic aorta is noted without aneurysm or dissection. Moderate splenomegaly is noted. Atherosclerosis of abdominal aorta is noted without aneurysm formation. Mild anasarca. Electronically Signed   By: Marijo Conception, M.D.   On: 07/16/2015 15:47   Ct Abdomen Pelvis W Contrast  07/16/2015  CLINICAL DATA:  Shortness of breath.  Generalized malaise. EXAM: CT ANGIOGRAPHY CHEST CT ABDOMEN AND PELVIS WITH CONTRAST TECHNIQUE: Multidetector CT imaging of the chest was performed using the standard protocol during bolus administration of intravenous contrast. Multiplanar CT image reconstructions and MIPs  were obtained to evaluate the vascular anatomy. Multidetector CT imaging of the abdomen and pelvis was performed using the standard protocol during bolus administration of intravenous contrast. CONTRAST:  15mL OMNIPAQUE IOHEXOL 350 MG/ML SOLN COMPARISON:  CT scan of abdomen pelvis of June 08, 2015. FINDINGS: CTA CHEST FINDINGS No pneumothorax is noted. Moderate bilateral pleural effusions are noted with right greater than left. Adjacent subsegmental atelectasis is noted. Fluid is noted in the left major fissure. There is no definite evidence of large central pulmonary embolus, but smaller peripheral pulmonary emboli cannot be excluded in the lower lobe branches due to respiratory motion artifact. Atherosclerosis of thoracic aorta is noted without aneurysm or dissection. Great vessels are widely patent. No significant mediastinal mass or adenopathy is noted. Coronary artery calcifications are noted. Multilevel degenerative disc disease is noted in the lower thoracic spine. CT ABDOMEN and PELVIS FINDINGS Multilevel degenerative disc disease is noted in the lumbar spine. No gallstones are noted. The liver and pancreas appear normal. Moderate splenomegaly is noted. Rounded low density is noted in inferior portion of the spleen most consistent with hemangioma. Adrenal glands  and kidneys appear normal. No hydronephrosis or renal obstruction is noted. Atherosclerosis of abdominal aorta is noted without aneurysm formation. There is no evidence of bowel obstruction. Urinary bladder appears normal. Prostate gland appears grossly normal. Mild anasarca is noted. No abnormal fluid collection is noted. No significant adenopathy is noted. Review of the MIP images confirms the above findings. IMPRESSION: Moderate bilateral pleural effusions are noted with adjacent subsegmental atelectasis, right greater than left. No evidence of large central pulmonary embolus. Smaller peripheral pulmonary emboli cannot be excluded on the  basis of this exam due to respiratory motion artifact. Atherosclerosis of thoracic aorta is noted without aneurysm or dissection. Moderate splenomegaly is noted. Atherosclerosis of abdominal aorta is noted without aneurysm formation. Mild anasarca. Electronically Signed   By: Marijo Conception, M.D.   On: 07/16/2015 15:47   Dg Chest Port 1 View  07/16/2015  CLINICAL DATA:  Dyspnea, LEFT side pain, flank pain, hypertension, stage II chronic kidney disease, history atrial fibrillation EXAM: PORTABLE CHEST 1 VIEW COMPARISON:  Portable exam 0911 hours compared to 06/28/2015 FINDINGS: Enlargement of cardiac silhouette. Atherosclerotic calcification aorta. Diffuse pulmonary infiltrates and probable RIGHT pleural effusion increased since previous exam. No pneumothorax. Bones demineralized with advanced BILATERAL glenohumeral degenerative changes and BILATERAL chronic rotator cuff tears. IMPRESSION: Enlargement of cardiac silhouette. Increased BILATERAL pulmonary infiltrates and RIGHT pleural effusion, question CHF. Electronically Signed   By: Lavonia Dana M.D.   On: 07/16/2015 09:50   US Abdomen Limited Ruq  07/16/2015  CLINICAL DATA:  Elevated LFTs. EXAM: US ABDOMEN LIMITED - RIGHT UPPER QUADRANT COMPARISON:  CT the abdomen 06/08/2015. Abdominal ultrasound 05/31/2015. FINDINGS: Gallbladder: No gallstones or wall thickening visualized. No sonographic Murphy sign noted. Gallbladder wall thickness is within normal limits a 3.0 mm. Common bile duct: Diameter: 2.0 mm, within normal limits Liver: The right lobe is somewhat prominent. Finding suggests cirrhosis. Focal lesions are present. A right pleural effusion is again noted. Trace abdominal ascites is also evident. IMPRESSION: 1. Suggestion of hepatic cirrhosis. 2. Right pleural effusion. 3. Trace abdominal ascites. 4. The gallbladder and common bile duct are within normal limits. Electronically Signed   By: San Morelle M.D.   On: 07/16/2015 12:04     Assessment/Plan:  1) Fever of unknown origin - no etiology and difficult to do anything to assess such as bone marrow, thoracentesis with his current deconditioned state.  I will continue to observe off of antibiotics since no source identified and has been on broad spectrum antibiotics with no improvement.  Source could be effusions, non-infectious such as leukemia.    2) hypoxia - pleural effusions, vascular congestion.  On lasix.    3) disposition - agree with palliative care.   Scharlene Gloss, [place] [facility] [place] [email or web address] [phone] pager   [phone] cell 07/17/2015, 11:05 AM

## 2015-07-17 NOTE — Progress Notes (Signed)
Name: Jack Schultz MRN: [id number] DOB: [birth year]    ADMISSION DATE:  07/24/2015 CONSULTATION DATE:  07/16/15  REFERRING MD :  Florencia Reasons  CHIEF COMPLAINT:  Hypoxemia, respiratory failure.  BRIEF PATIENT DESCRIPTION: Admitted on 10/24 failure to thrive, anemia, dyspnea, rapid A. fib  SIGNIFICANT EVENTS   STUDIES:  CXR (07/06/15)  Enlargement of cardiac silhouette. Increased BILATERAL pulmonary infiltrates and RIGHT pleural effusion, question CHF.  CT chest abd pelvis (10/23) No PE, B/L effusion with compressive atelectaisis.  Echo (10/23) LVEF 55-60%  HISTORY OF PRESENT ILLNESS:   81 year old male with history of hypertension, hyperlipidemia, thrombocytosis (on hydroxyurea), recurrent epistaxis for to be related to myeloproliferative disorder and aspirin therapy hypothyroidism, iron deficiency anemia and GERD. He had a recent admission for failure to thrive he was found to have healthcare associated pneumonia. His stay was complicated by acute kidney injury, complete heart block, diastolic heart failure, anemia requiring transfusions. He was discharged on 06/13/15 to SNF.  He was admitted again on 07/16/2015 from the oncologist office for malaise, rapid A. fib, dyspnea. PCCM consulted for ongoing respiratory failure with hypoxemia.  PAST MEDICAL HISTORY :   has a past medical history of Hypertension; Hyperlipidemia; Pre-diabetes; Thyroid disease; Thrombocytosis ([place]); GERD (gastroesophageal reflux disease); Vitamin D deficiency; Vision abnormalities; Neuropathy ([place]); and Syncope and collapse.  has past surgical history that includes No past surgeries. Prior to Admission medications   Medication Sig Start Date End Date Taking? Authorizing Provider  acetaminophen (TYLENOL) 500 MG tablet Take 500 mg by mouth every 6 (six) hours as needed for headache.   Yes Historical Provider, MD  aspirin 81 MG tablet Take 81 mg by mouth daily.   Yes Historical Provider, MD  cefdinir  (OMNICEF) 300 MG capsule Take 300 mg by mouth at bedtime. ABT Start Date 07/07/15 & End Date 07/04/2015.   Yes Historical Provider, MD  Cholecalciferol (VITAMIN D PO) Take 5,000 Units by mouth daily.   Yes Historical Provider, MD  feeding supplement, ENSURE ENLIVE, (ENSURE ENLIVE) LIQD Take 237 mLs by mouth 2 (two) times daily between meals. Patient taking differently: Take 237 mLs by mouth 4 (four) times daily.  06/13/15  Yes Belkys A Regalado, MD  ferrous sulfate 325 (65 FE) MG tablet Take 325 mg by mouth daily with breakfast.   Yes Historical Provider, MD  furosemide (LASIX) 40 MG tablet Take 40 mg by mouth daily.   Yes Historical Provider, MD  hydroxyurea (HYDREA) 500 MG capsule Take 500 mg by mouth daily.   Yes Historical Provider, MD  levothyroxine (SYNTHROID, LEVOTHROID) 50 MCG tablet Take 50 mcg by mouth daily.   Yes Historical Provider, MD  metoprolol tartrate (LOPRESSOR) 25 MG tablet Take 0.5 tablets (12.5 mg total) by mouth 2 (two) times daily. Patient taking differently: Take 25 mg by mouth 2 (two) times daily.  06/23/15  Yes Liliane Shi, PA-C  potassium chloride SA (K-DUR,KLOR-CON) 20 MEQ tablet Take 1 tablet (20 mEq total) by mouth daily. 06/13/15  Yes Belkys A Regalado, MD  acetaminophen (TYLENOL) 650 MG suppository Place 1 suppository (650 mg total) rectally every 6 (six) hours as needed for mild pain (or Fever >/= 101). 06/13/15   Belkys A Regalado, MD  polyethylene glycol (MIRALAX / GLYCOLAX) packet Take 17 g by mouth daily as needed for mild constipation. 06/13/15   Elmarie Shiley, MD   Allergies  Allergen Reactions  . Ace Inhibitors Cough    FAMILY HISTORY:  family history includes Heart disease in his  father; Hypertension in his father. SOCIAL HISTORY:  reports that he quit smoking about 39 years ago. His smoking use included Cigarettes. He quit smokeless tobacco use about 6 weeks ago. His smokeless tobacco use included Snuff. He reports that he does not drink alcohol or use  illicit drugs.   SUBJECTIVE: Somnolent, arousable. Denies any dyspnea.   VITAL SIGNS: Temp:  [97.9 F (36.6 C)-100.7 F (38.2 C)] 98.4 F (36.9 C) (10/24 0800) Pulse Rate:  [73-133] 94 (10/24 0800) Resp:  [20-34] 25 (10/24 0800) BP: (94-126)/(39-54) 100/54 mmHg (10/24 0800) SpO2:  [86 %-95 %] 94 % (10/24 0800) Weight:  [164 lb 7.4 oz (74.6 kg)] 164 lb 7.4 oz (74.6 kg) (10/24 0500)  PHYSICAL EXAMINATION: General: Sleeping but arousable, no distress Neuro:  Cranial nerves intact, no gross focal deficits. HEENT:  PERRLA, moist mucous membranes Cardiovascular:  Regular rate and rhythm, no MRG Lungs:  Bilateral crackles. No wheeze Abdomen:  Soft, positive bowel sounds, nontender nondistended Skin:  Intact   Recent Labs Lab 07/15/15 0342 07/16/15 0350 07/17/15 0150  NA 130* 130* 132*  K 3.6 3.9 3.9  CL 98* 99* 99*  CO2 25 24 24   BUN 31* 31* 38*  CREATININE 1.01 0.94 1.35*  GLUCOSE 101* 101* 128*    Recent Labs Lab 07/16/15 0350 07/16/15 1938 07/17/15 0150  HGB 9.2* 8.7* 8.5*  HCT 28.5* 26.5* 26.1*  WBC 27.0* 31.1* 32.4*  PLT 296 290 292   Ct Angio Chest Pe W/cm &/or Wo Cm  07/16/2015  CLINICAL DATA:  Shortness of breath.  Generalized malaise. EXAM: CT ANGIOGRAPHY CHEST CT ABDOMEN AND PELVIS WITH CONTRAST TECHNIQUE: Multidetector CT imaging of the chest was performed using the standard protocol during bolus administration of intravenous contrast. Multiplanar CT image reconstructions and MIPs were obtained to evaluate the vascular anatomy. Multidetector CT imaging of the abdomen and pelvis was performed using the standard protocol during bolus administration of intravenous contrast. CONTRAST:  154mL OMNIPAQUE IOHEXOL 350 MG/ML SOLN COMPARISON:  CT scan of abdomen pelvis of June 08, 2015. FINDINGS: CTA CHEST FINDINGS No pneumothorax is noted. Moderate bilateral pleural effusions are noted with right greater than left. Adjacent subsegmental atelectasis is noted. Fluid  is noted in the left major fissure. There is no definite evidence of large central pulmonary embolus, but smaller peripheral pulmonary emboli cannot be excluded in the lower lobe branches due to respiratory motion artifact. Atherosclerosis of thoracic aorta is noted without aneurysm or dissection. Great vessels are widely patent. No significant mediastinal mass or adenopathy is noted. Coronary artery calcifications are noted. Multilevel degenerative disc disease is noted in the lower thoracic spine. CT ABDOMEN and PELVIS FINDINGS Multilevel degenerative disc disease is noted in the lumbar spine. No gallstones are noted. The liver and pancreas appear normal. Moderate splenomegaly is noted. Rounded low density is noted in inferior portion of the spleen most consistent with hemangioma. Adrenal glands and kidneys appear normal. No hydronephrosis or renal obstruction is noted. Atherosclerosis of abdominal aorta is noted without aneurysm formation. There is no evidence of bowel obstruction. Urinary bladder appears normal. Prostate gland appears grossly normal. Mild anasarca is noted. No abnormal fluid collection is noted. No significant adenopathy is noted. Review of the MIP images confirms the above findings. IMPRESSION: Moderate bilateral pleural effusions are noted with adjacent subsegmental atelectasis, right greater than left. No evidence of large central pulmonary embolus. Smaller peripheral pulmonary emboli cannot be excluded on the basis of this exam due to respiratory motion artifact. Atherosclerosis of thoracic  aorta is noted without aneurysm or dissection. Moderate splenomegaly is noted. Atherosclerosis of abdominal aorta is noted without aneurysm formation. Mild anasarca. Electronically Signed   By: Marijo Conception, M.D.   On: 07/16/2015 15:47   Ct Abdomen Pelvis W Contrast  07/16/2015  CLINICAL DATA:  Shortness of breath.  Generalized malaise. EXAM: CT ANGIOGRAPHY CHEST CT ABDOMEN AND PELVIS WITH CONTRAST  TECHNIQUE: Multidetector CT imaging of the chest was performed using the standard protocol during bolus administration of intravenous contrast. Multiplanar CT image reconstructions and MIPs were obtained to evaluate the vascular anatomy. Multidetector CT imaging of the abdomen and pelvis was performed using the standard protocol during bolus administration of intravenous contrast. CONTRAST:  12mL OMNIPAQUE IOHEXOL 350 MG/ML SOLN COMPARISON:  CT scan of abdomen pelvis of June 08, 2015. FINDINGS: CTA CHEST FINDINGS No pneumothorax is noted. Moderate bilateral pleural effusions are noted with right greater than left. Adjacent subsegmental atelectasis is noted. Fluid is noted in the left major fissure. There is no definite evidence of large central pulmonary embolus, but smaller peripheral pulmonary emboli cannot be excluded in the lower lobe branches due to respiratory motion artifact. Atherosclerosis of thoracic aorta is noted without aneurysm or dissection. Great vessels are widely patent. No significant mediastinal mass or adenopathy is noted. Coronary artery calcifications are noted. Multilevel degenerative disc disease is noted in the lower thoracic spine. CT ABDOMEN and PELVIS FINDINGS Multilevel degenerative disc disease is noted in the lumbar spine. No gallstones are noted. The liver and pancreas appear normal. Moderate splenomegaly is noted. Rounded low density is noted in inferior portion of the spleen most consistent with hemangioma. Adrenal glands and kidneys appear normal. No hydronephrosis or renal obstruction is noted. Atherosclerosis of abdominal aorta is noted without aneurysm formation. There is no evidence of bowel obstruction. Urinary bladder appears normal. Prostate gland appears grossly normal. Mild anasarca is noted. No abnormal fluid collection is noted. No significant adenopathy is noted. Review of the MIP images confirms the above findings. IMPRESSION: Moderate bilateral pleural  effusions are noted with adjacent subsegmental atelectasis, right greater than left. No evidence of large central pulmonary embolus. Smaller peripheral pulmonary emboli cannot be excluded on the basis of this exam due to respiratory motion artifact. Atherosclerosis of thoracic aorta is noted without aneurysm or dissection. Moderate splenomegaly is noted. Atherosclerosis of abdominal aorta is noted without aneurysm formation. Mild anasarca. Electronically Signed   By: Marijo Conception, M.D.   On: 07/16/2015 15:47   Dg Chest Port 1 View  07/16/2015  CLINICAL DATA:  Dyspnea, LEFT side pain, flank pain, hypertension, stage II chronic kidney disease, history atrial fibrillation EXAM: PORTABLE CHEST 1 VIEW COMPARISON:  Portable exam 0911 hours compared to 07/07/2015 FINDINGS: Enlargement of cardiac silhouette. Atherosclerotic calcification aorta. Diffuse pulmonary infiltrates and probable RIGHT pleural effusion increased since previous exam. No pneumothorax. Bones demineralized with advanced BILATERAL glenohumeral degenerative changes and BILATERAL chronic rotator cuff tears. IMPRESSION: Enlargement of cardiac silhouette. Increased BILATERAL pulmonary infiltrates and RIGHT pleural effusion, question CHF. Electronically Signed   By: Lavonia Dana M.D.   On: 07/16/2015 09:50   US Abdomen Limited Ruq  07/16/2015  CLINICAL DATA:  Elevated LFTs. EXAM: US ABDOMEN LIMITED - RIGHT UPPER QUADRANT COMPARISON:  CT the abdomen 06/08/2015. Abdominal ultrasound 05/31/2015. FINDINGS: Gallbladder: No gallstones or wall thickening visualized. No sonographic Murphy sign noted. Gallbladder wall thickness is within normal limits a 3.0 mm. Common bile duct: Diameter: 2.0 mm, within normal limits Liver: The right lobe is somewhat  prominent. Finding suggests cirrhosis. Focal lesions are present. A right pleural effusion is again noted. Trace abdominal ascites is also evident. IMPRESSION: 1. Suggestion of hepatic cirrhosis. 2. Right pleural  effusion. 3. Trace abdominal ascites. 4. The gallbladder and common bile duct are within normal limits. Electronically Signed   By: San Morelle M.D.   On: 07/16/2015 12:04    ASSESSMENT / PLAN: Dyspnea with hypoxemia.  Respiratory symptoms are likely secondary to heart failure, pulmonary vascular congestion. He has already received Lasix today and his oxygen requirements have improved from a Ventimask to nasal cannula. It is possible that he may have an underlying pneumonia. PE ruled out yesterday.  He should be continued on diuresis, broad-spectrum antibiotics for now and follow up on the echocardiogram and the CT.   Reccs: - Continue diuresis. Keep 1 lt negative today. - Broad spectrum abx with vanco and zosyn.  - Check cultures and procalcitonin.  Marshell Garfinkel MD [facility] Pager [phone] If no answer or after 3pm call: [phone] 07/17/2015, 10:24 AM

## 2015-07-17 NOTE — Consult Note (Signed)
Chief Complaint: Patient was seen in consultation today for CT-guided bone marrow biopsy   Referring Physician(s): Sherrill,B   History of Present Illness: Jack Schultz is a 81 y.o. male with history of myeloproliferative disorder and essential thrombocytosis recently admitted with generalized weakness/FTT, recurrent falls, weight loss, anorexia, left upper extremity pain and swelling, and recent fevers of unknown origin. Subsequent imaging studies have revealed degenerative changes in the left elbow but no effusion, chronic left rotator cuff tear with obliteration of the subacromial space, glenohumeral arthropathy and multiple intra-articular loose bodies, bibasilar pleural effusions, moderate splenomegaly, mild anasarca. Blood and urine cultures negative to date. Laboratory studies revealed leukocytosis with most recent WBC of 32.4, hemoglobin 8.5, platelets 292k. Request now received from oncology for CT-guided bone marrow biopsy for pathology as well as AFB/fungal cultures for further evaluation. Patient currently denies chest pain, cough, nausea /vomiting. He does endorse abdominal pain.  Past Medical History  Diagnosis Date  . Hypertension   . Hyperlipidemia   . Pre-diabetes   . Thyroid disease   . Thrombocytosis ([place])   . GERD (gastroesophageal reflux disease)   . Vitamin D deficiency   . Vision abnormalities   . Neuropathy ([place])   . Syncope and collapse     Past Surgical History  Procedure Laterality Date  . No past surgeries      Allergies: Ace inhibitors  Medications: Prior to Admission medications   Medication Sig Start Date End Date Taking? Authorizing Provider  acetaminophen (TYLENOL) 500 MG tablet Take 500 mg by mouth every 6 (six) hours as needed for headache.   Yes Historical Provider, MD  aspirin 81 MG tablet Take 81 mg by mouth daily.   Yes Historical Provider, MD  cefdinir (OMNICEF) 300 MG capsule Take 300 mg by mouth at bedtime. ABT Start Date  07/07/15 & End Date 07/16/2015.   Yes Historical Provider, MD  Cholecalciferol (VITAMIN D PO) Take 5,000 Units by mouth daily.   Yes Historical Provider, MD  feeding supplement, ENSURE ENLIVE, (ENSURE ENLIVE) LIQD Take 237 mLs by mouth 2 (two) times daily between meals. Patient taking differently: Take 237 mLs by mouth 4 (four) times daily.  06/13/15  Yes Belkys A Regalado, MD  ferrous sulfate 325 (65 FE) MG tablet Take 325 mg by mouth daily with breakfast.   Yes Historical Provider, MD  furosemide (LASIX) 40 MG tablet Take 40 mg by mouth daily.   Yes Historical Provider, MD  hydroxyurea (HYDREA) 500 MG capsule Take 500 mg by mouth daily.   Yes Historical Provider, MD  levothyroxine (SYNTHROID, LEVOTHROID) 50 MCG tablet Take 50 mcg by mouth daily.   Yes Historical Provider, MD  metoprolol tartrate (LOPRESSOR) 25 MG tablet Take 0.5 tablets (12.5 mg total) by mouth 2 (two) times daily. Patient taking differently: Take 25 mg by mouth 2 (two) times daily.  06/23/15  Yes Liliane Shi, PA-C  potassium chloride SA (K-DUR,KLOR-CON) 20 MEQ tablet Take 1 tablet (20 mEq total) by mouth daily. 06/13/15  Yes Belkys A Regalado, MD  acetaminophen (TYLENOL) 650 MG suppository Place 1 suppository (650 mg total) rectally every 6 (six) hours as needed for mild pain (or Fever >/= 101). 06/13/15   Belkys A Regalado, MD  polyethylene glycol (MIRALAX / GLYCOLAX) packet Take 17 g by mouth daily as needed for mild constipation. 06/13/15   Elmarie Shiley, MD     Family History  Problem Relation Age of Onset  . Hypertension Father   .  Heart disease Father     Social History   Social History  . Marital Status: Married    Spouse Name: N/A  . Number of Children: N/A  . Years of Education: N/A   Social History Main Topics  . Smoking status: Former Smoker    Types: Cigarettes    Quit date: 09/24/1975  . Smokeless tobacco: Former Systems developer    Types: Snuff    Quit date: 05/31/2015  . Alcohol Use: No  . Drug Use: No  .  Sexual Activity: No   Other Topics Concern  . None   Social History Narrative     Review of Systems see above  Vital Signs: BP 127/37 mmHg  Pulse 77  Temp(Src) 97.8 F (36.6 C) (Oral)  Resp 18  Ht $R'5\' 11"'by$  (1.803 m)  Wt 164 lb 7.4 oz (74.6 kg)  BMI 22.95 kg/m2  SpO2 91%  Physical Exam patient awake, hard of hearing, responds briefly to a few questions; family in room; chest with diminished breath sounds at bases/few crackles; heart-IRRR; abdomen soft, positive bowel sounds, generalized tenderness to palpation; scattered ecchymosis/ petechiae/purpura on upper extremities; no significant lower extremity edema.  Mallampati Score:     Imaging: Dg Chest 2 View  07/11/2015  CLINICAL DATA:  Weakness, shortness of breath, hypertension, former smoker EXAM: CHEST  2 VIEW COMPARISON:  06/11/2015 FINDINGS: Enlargement of cardiac silhouette. Atherosclerotic calcification mild tortuosity of thoracic aorta. Mediastinal contours and pulmonary vascularity otherwise normal. Bibasilar infiltrates, improved on LEFT. Decreased basilar effusions. Underlying emphysematous changes. Upper lungs clear. No pneumothorax. Advanced BILATERAL glenohumeral degenerative changes and chronic rotator cuff tears. Osseous demineralization. IMPRESSION: Emphysematous changes with bibasilar infiltrates, improved on LEFT. Decreased bibasilar effusions and atelectasis Electronically Signed   By: Lavonia Dana M.D.   On: 07/19/2015 18:45   Dg Elbow 2 Views Left  07/21/2015  CLINICAL DATA:  Pain, swelling and limited range of motion of the left elbow. EXAM: LEFT ELBOW - 2 VIEW COMPARISON:  None. FINDINGS: Mild degenerative changes but no acute fracture or osteochondral abnormality. No joint effusion. No findings to suggest olecranon bursitis. IMPRESSION: Degenerative changes but no acute bony findings for joint effusion. Electronically Signed   By: Marijo Sanes M.D.   On: 07/21/2015 18:47   Ct Angio Chest Pe W/cm &/or Wo  Cm  07/16/2015  CLINICAL DATA:  Shortness of breath.  Generalized malaise. EXAM: CT ANGIOGRAPHY CHEST CT ABDOMEN AND PELVIS WITH CONTRAST TECHNIQUE: Multidetector CT imaging of the chest was performed using the standard protocol during bolus administration of intravenous contrast. Multiplanar CT image reconstructions and MIPs were obtained to evaluate the vascular anatomy. Multidetector CT imaging of the abdomen and pelvis was performed using the standard protocol during bolus administration of intravenous contrast. CONTRAST:  137mL OMNIPAQUE IOHEXOL 350 MG/ML SOLN COMPARISON:  CT scan of abdomen pelvis of June 08, 2015. FINDINGS: CTA CHEST FINDINGS No pneumothorax is noted. Moderate bilateral pleural effusions are noted with right greater than left. Adjacent subsegmental atelectasis is noted. Fluid is noted in the left major fissure. There is no definite evidence of large central pulmonary embolus, but smaller peripheral pulmonary emboli cannot be excluded in the lower lobe branches due to respiratory motion artifact. Atherosclerosis of thoracic aorta is noted without aneurysm or dissection. Great vessels are widely patent. No significant mediastinal mass or adenopathy is noted. Coronary artery calcifications are noted. Multilevel degenerative disc disease is noted in the lower thoracic spine. CT ABDOMEN and PELVIS FINDINGS Multilevel degenerative disc disease is noted  in the lumbar spine. No gallstones are noted. The liver and pancreas appear normal. Moderate splenomegaly is noted. Rounded low density is noted in inferior portion of the spleen most consistent with hemangioma. Adrenal glands and kidneys appear normal. No hydronephrosis or renal obstruction is noted. Atherosclerosis of abdominal aorta is noted without aneurysm formation. There is no evidence of bowel obstruction. Urinary bladder appears normal. Prostate gland appears grossly normal. Mild anasarca is noted. No abnormal fluid collection is  noted. No significant adenopathy is noted. Review of the MIP images confirms the above findings. IMPRESSION: Moderate bilateral pleural effusions are noted with adjacent subsegmental atelectasis, right greater than left. No evidence of large central pulmonary embolus. Smaller peripheral pulmonary emboli cannot be excluded on the basis of this exam due to respiratory motion artifact. Atherosclerosis of thoracic aorta is noted without aneurysm or dissection. Moderate splenomegaly is noted. Atherosclerosis of abdominal aorta is noted without aneurysm formation. Mild anasarca. Electronically Signed   By: Marijo Conception, M.D.   On: 07/16/2015 15:47   Ct Abdomen Pelvis W Contrast  07/16/2015  CLINICAL DATA:  Shortness of breath.  Generalized malaise. EXAM: CT ANGIOGRAPHY CHEST CT ABDOMEN AND PELVIS WITH CONTRAST TECHNIQUE: Multidetector CT imaging of the chest was performed using the standard protocol during bolus administration of intravenous contrast. Multiplanar CT image reconstructions and MIPs were obtained to evaluate the vascular anatomy. Multidetector CT imaging of the abdomen and pelvis was performed using the standard protocol during bolus administration of intravenous contrast. CONTRAST:  147mL OMNIPAQUE IOHEXOL 350 MG/ML SOLN COMPARISON:  CT scan of abdomen pelvis of June 08, 2015. FINDINGS: CTA CHEST FINDINGS No pneumothorax is noted. Moderate bilateral pleural effusions are noted with right greater than left. Adjacent subsegmental atelectasis is noted. Fluid is noted in the left major fissure. There is no definite evidence of large central pulmonary embolus, but smaller peripheral pulmonary emboli cannot be excluded in the lower lobe branches due to respiratory motion artifact. Atherosclerosis of thoracic aorta is noted without aneurysm or dissection. Great vessels are widely patent. No significant mediastinal mass or adenopathy is noted. Coronary artery calcifications are noted. Multilevel  degenerative disc disease is noted in the lower thoracic spine. CT ABDOMEN and PELVIS FINDINGS Multilevel degenerative disc disease is noted in the lumbar spine. No gallstones are noted. The liver and pancreas appear normal. Moderate splenomegaly is noted. Rounded low density is noted in inferior portion of the spleen most consistent with hemangioma. Adrenal glands and kidneys appear normal. No hydronephrosis or renal obstruction is noted. Atherosclerosis of abdominal aorta is noted without aneurysm formation. There is no evidence of bowel obstruction. Urinary bladder appears normal. Prostate gland appears grossly normal. Mild anasarca is noted. No abnormal fluid collection is noted. No significant adenopathy is noted. Review of the MIP images confirms the above findings. IMPRESSION: Moderate bilateral pleural effusions are noted with adjacent subsegmental atelectasis, right greater than left. No evidence of large central pulmonary embolus. Smaller peripheral pulmonary emboli cannot be excluded on the basis of this exam due to respiratory motion artifact. Atherosclerosis of thoracic aorta is noted without aneurysm or dissection. Moderate splenomegaly is noted. Atherosclerosis of abdominal aorta is noted without aneurysm formation. Mild anasarca. Electronically Signed   By: Marijo Conception, M.D.   On: 07/16/2015 15:47   Dg Chest Port 1 View  07/16/2015  CLINICAL DATA:  Dyspnea, LEFT side pain, flank pain, hypertension, stage II chronic kidney disease, history atrial fibrillation EXAM: PORTABLE CHEST 1 VIEW COMPARISON:  Portable exam 0911  hours compared to 07/14/2015 FINDINGS: Enlargement of cardiac silhouette. Atherosclerotic calcification aorta. Diffuse pulmonary infiltrates and probable RIGHT pleural effusion increased since previous exam. No pneumothorax. Bones demineralized with advanced BILATERAL glenohumeral degenerative changes and BILATERAL chronic rotator cuff tears. IMPRESSION: Enlargement of cardiac  silhouette. Increased BILATERAL pulmonary infiltrates and RIGHT pleural effusion, question CHF. Electronically Signed   By: Lavonia Dana M.D.   On: 07/16/2015 09:50   US Abdomen Limited Ruq  07/16/2015  CLINICAL DATA:  Elevated LFTs. EXAM: US ABDOMEN LIMITED - RIGHT UPPER QUADRANT COMPARISON:  CT the abdomen 06/08/2015. Abdominal ultrasound 05/31/2015. FINDINGS: Gallbladder: No gallstones or wall thickening visualized. No sonographic Murphy sign noted. Gallbladder wall thickness is within normal limits a 3.0 mm. Common bile duct: Diameter: 2.0 mm, within normal limits Liver: The right lobe is somewhat prominent. Finding suggests cirrhosis. Focal lesions are present. A right pleural effusion is again noted. Trace abdominal ascites is also evident. IMPRESSION: 1. Suggestion of hepatic cirrhosis. 2. Right pleural effusion. 3. Trace abdominal ascites. 4. The gallbladder and common bile duct are within normal limits. Electronically Signed   By: San Morelle M.D.   On: 07/16/2015 12:04   Ct Extrem Up Entire Arm L W/cm  07/17/2015  CLINICAL DATA:  Left arm pain. Hand blisters. No known injury. Initial encounter. EXAM: CT OF THE UPPER LEFT EXTREMITY WITH CONTRAST TECHNIQUE: Multi detector CT imaging of the left upper extremity from shoulder through the hand was performed without intravenous contrast. Multiplanar reformatted images were generated. CONTRAST:  1107mL OMNIPAQUE IOHEXOL 300 MG/ML  SOLN COMPARISON:  Chest CT 07/16/2015. Left shoulder radiographs 05/31/2015. FINDINGS: There is no evidence of acute fracture or dislocation. Severe glenohumeral degenerative changes are present with joint space loss, osteophytes and multiple intra-articular loose bodies. The subacromial space of the left shoulder is obliterated consistent with a chronic rotator cuff tear. There is erosion of the undersurface of the acromion. There are relatively mild degenerative changes at the left elbow with spurring. There is no large  elbow joint effusion. There are mild degenerative changes within the wrist and hand. No focal soft tissue mass, fluid collection or abnormal enhancement identified. There is mild edema throughout the subcutaneous fat of the left upper extremity. Scattered vascular calcifications are noted. IMPRESSION: 1. No acute findings or explanation for the patient's symptoms. 2. Chronic rotator cuff tear with obliteration of the subacromial space, glenohumeral arthropathy and multiple intra-articular loose bodies. 3. Mild subcutaneous edema throughout the left upper extremity. No focal soft tissue abnormalities observed. Electronically Signed   By: Richardean Sale M.D.   On: 07/17/2015 13:06    Labs:  CBC:  Recent Labs  07/15/15 0342 07/16/15 0350 07/16/15 1938 07/17/15 0150  WBC 19.7* 27.0* 31.1* 32.4*  HGB 7.6* 9.2* 8.7* 8.5*  HCT 22.8* 28.5* 26.5* 26.1*  PLT 278 296 290 292    COAGS:  Recent Labs  06/01/15 1040 07/16/15 0350  INR 1.61* 1.42  APTT 44*  --     BMP:  Recent Labs  07/14/15 1925 07/15/15 0342 07/16/15 0350 07/17/15 0150  NA 130* 130* 130* 132*  K 3.8 3.6 3.9 3.9  CL 98* 98* 99* 99*  CO2 $Re'23 25 24 24  'tre$ GLUCOSE 151* 101* 101* 128*  BUN 35* 31* 31* 38*  CALCIUM 7.9* 7.8* 8.2* 8.1*  CREATININE 1.04 1.01 0.94 1.35*  GFRNONAA >60 >60 >60 45*  GFRAA >60 >60 >60 52*    LIVER FUNCTION TESTS:  Recent Labs  07/14/15 1925 07/15/15 0342 07/16/15 0350  07/16/15 1103 07/17/15 0150  BILITOT 1.3* 1.5* 1.9* 2.4* 1.9*  AST 73* 61* 43*  --  26  ALT 44 46 46  --  32  ALKPHOS 133* 149* 176*  --  128*  PROT 5.0* 5.0* 5.1*  --  5.1*  ALBUMIN 1.7* 1.7* 1.7*  --  1.6*    TUMOR MARKERS: No results for input(s): AFPTM, CEA, CA199, CHROMGRNA in the last 8760 hours.  Assessment and Plan: Frail white male with history of myeloproliferative disorder/essential thrombocytosis recently admitted with fever, respiratory failure, altered mental status and failure to thrive. Request  now received for CT-guided bone marrow biopsy for pathology as well as culture to assess for myelodysplasia, myelofibrosis, progression toward leukemia or infectious process.Risks and benefits discussed with the patient/family including, but not limited to bleeding, infection, damage to adjacent structures or low yield requiring additional tests.All of the patient's questions were answered, patient is agreeable to proceed.Consent signed and in chart. Due to patient's overall frail status and severe weakness positioning for bone marrow biopsy as well as IV conscious sedation may be difficult to accomplish in this case. Will reevaluate patient's status in a.m. and if all parties are in agreement will attempt to perform 10/25.      Thank you for this interesting consult.  I greatly enjoyed meeting CLAYVON PARLETT and look forward to participating in their care.  A copy of this report was sent to the requesting provider on this date.  Signed: D. Rowe Robert 07/17/2015, 5:00 PM   I spent a total of 20 minutes in face to face in clinical consultation, greater than 50% of which was counseling/coordinating care for CT-guided bone marrow biopsy

## 2015-07-17 NOTE — Progress Notes (Signed)
Date: July 17, 2015 Chart reviewed for concurrent status and case management needs. Will continue to follow patient for changes and needs:  Remains on fi02 55% via [place], [place], [place], [place]

## 2015-07-18 ENCOUNTER — Ambulatory Visit (HOSPITAL_COMMUNITY): Payer: Medicare Other

## 2015-07-18 ENCOUNTER — Encounter (HOSPITAL_COMMUNITY): Payer: Medicare Other

## 2015-07-18 ENCOUNTER — Inpatient Hospital Stay (HOSPITAL_COMMUNITY): Payer: Medicare Other

## 2015-07-18 DIAGNOSIS — R531 Weakness: Secondary | ICD-10-CM

## 2015-07-18 DIAGNOSIS — R609 Edema, unspecified: Secondary | ICD-10-CM

## 2015-07-18 DIAGNOSIS — R238 Other skin changes: Secondary | ICD-10-CM

## 2015-07-18 LAB — CBC WITH DIFFERENTIAL/PLATELET
BAND NEUTROPHILS: 0 %
BASOS PCT: 2 %
Basophils Absolute: 0.7 10*3/uL — ABNORMAL HIGH (ref 0.0–0.1)
Blasts: 0 %
EOS PCT: 0 %
Eosinophils Absolute: 0 10*3/uL (ref 0.0–0.7)
HCT: 24.2 % — ABNORMAL LOW (ref 39.0–52.0)
Hemoglobin: 7.9 g/dL — ABNORMAL LOW (ref 13.0–17.0)
LYMPHS ABS: 4.1 10*3/uL — AB (ref 0.7–4.0)
Lymphocytes Relative: 12 %
MCH: 30 pg (ref 26.0–34.0)
MCHC: 32.6 g/dL (ref 30.0–36.0)
MCV: 92 fL (ref 78.0–100.0)
MONO ABS: 3.8 10*3/uL — AB (ref 0.1–1.0)
MYELOCYTES: 0 %
Metamyelocytes Relative: 0 %
Monocytes Relative: 11 %
NEUTROS PCT: 75 %
NRBC: 0 /100{WBCs}
Neutro Abs: 25.5 10*3/uL — ABNORMAL HIGH (ref 1.7–7.7)
OTHER: 0 %
PLATELETS: 280 10*3/uL (ref 150–400)
Promyelocytes Absolute: 0 %
RBC: 2.63 MIL/uL — ABNORMAL LOW (ref 4.22–5.81)
RDW: 20.3 % — ABNORMAL HIGH (ref 11.5–15.5)
WBC: 34.1 10*3/uL — ABNORMAL HIGH (ref 4.0–10.5)

## 2015-07-18 LAB — COMPREHENSIVE METABOLIC PANEL
ALT: 28 U/L (ref 17–63)
AST: 29 U/L (ref 15–41)
Albumin: 1.6 g/dL — ABNORMAL LOW (ref 3.5–5.0)
Alkaline Phosphatase: 135 U/L — ABNORMAL HIGH (ref 38–126)
Anion gap: 8 (ref 5–15)
BUN: 53 mg/dL — ABNORMAL HIGH (ref 6–20)
CHLORIDE: 100 mmol/L — AB (ref 101–111)
CO2: 26 mmol/L (ref 22–32)
Calcium: 8.6 mg/dL — ABNORMAL LOW (ref 8.9–10.3)
Creatinine, Ser: 1.52 mg/dL — ABNORMAL HIGH (ref 0.61–1.24)
GFR calc non Af Amer: 39 mL/min — ABNORMAL LOW (ref >60)
GFR, EST AFRICAN AMERICAN: 45 mL/min — AB (ref >60)
Glucose, Bld: 123 mg/dL — ABNORMAL HIGH (ref 65–99)
POTASSIUM: 4.3 mmol/L (ref 3.5–5.1)
SODIUM: 134 mmol/L — AB (ref 135–145)
Total Bilirubin: 1.3 mg/dL — ABNORMAL HIGH (ref 0.3–1.2)
Total Protein: 5.2 g/dL — ABNORMAL LOW (ref 6.5–8.1)

## 2015-07-18 LAB — RHEUMATOID FACTOR: RHEUMATOID FACTOR: 30 [IU]/mL — AB (ref 0.0–13.9)

## 2015-07-18 LAB — HEPATITIS PANEL, ACUTE
HCV Ab: <0.1 s/co ratio (ref 0.0–0.9)
HEP B S AG: NEGATIVE
Hep A IgM: POSITIVE — AB
Hep B C IgM: NEGATIVE

## 2015-07-18 LAB — PROCALCITONIN: Procalcitonin: 2.34 ng/mL

## 2015-07-18 MED ORDER — LIP MEDEX EX OINT: TOPICAL_OINTMENT | CUTANEOUS | Status: DC | PRN

## 2015-07-18 MED ORDER — ACETAMINOPHEN 10 MG/ML IV SOLN
1000.0000 mg | Freq: Four times a day (QID) | INTRAVENOUS | Status: AC
  Administered 2015-07-18 – 2015-07-19 (×4): 1000 mg via INTRAVENOUS
  Filled 2015-07-18 (×7): qty 100

## 2015-07-18 MED ORDER — FUROSEMIDE 10 MG/ML IJ SOLN
20.0000 mg | Freq: Every day | INTRAMUSCULAR | Status: DC
  Administered 2015-07-18: 20 mg via INTRAVENOUS
  Filled 2015-07-18: qty 2

## 2015-07-18 MED ORDER — ACETAMINOPHEN 10 MG/ML IV SOLN
1000.0000 mg | Freq: Once | INTRAVENOUS | Status: AC
  Administered 2015-07-18: 1000 mg via INTRAVENOUS
  Filled 2015-07-18: qty 100

## 2015-07-18 MED ORDER — FUROSEMIDE 10 MG/ML IJ SOLN
INTRAMUSCULAR | Status: AC
  Filled 2015-07-18: qty 2

## 2015-07-18 NOTE — Progress Notes (Signed)
*  Preliminary Results* Right upper extremity venous duplex completed. Right upper extremity is negative for deep vein thrombosis. There is evidence of chronic superficial vein thrombosis involving the right cephalic vein.  07/18/2015 10:50 AM  Maudry Mayhew, RVT, RDCS, RDMS

## 2015-07-18 NOTE — Progress Notes (Signed)
Name: Jack Schultz MRN: [id number] DOB: 09/27/1923    ADMISSION DATE:  07/12/2015 CONSULTATION DATE:  07/16/15  REFERRING MD :  Florencia Reasons  CHIEF COMPLAINT:  Hypoxemia, respiratory failure.  BRIEF PATIENT DESCRIPTION: 81 y/o M with PMH of myeloproliferative disorder, hypothyroidism, IDA, and GERD who was admitted on 10/24 failure to thrive, anemia, dyspnea, rapid A. Fib from [place] office.  Found to have an possible exacerbation of CHF +/- infiltrate.  PE negative 10/23 CT.  PCCM consulted for dyspnea / hypoxemia   SIGNIFICANT EVENTS  10/24  Admit 10/20  Rash on hand 10/25  Worsening rash on hands  STUDIES:  CXR 10/13 >> enlarged cardiac silhouette, increased bilateral pulmonary infiltrates, ? CHF CT Chest/ABD/Pelvis 10/23 >> neg for PE, bilateral effusion with compressive atx ECHO 10/23 >> LVEF 55-60%    SUBJECTIVE:  Net negative 850. On 45% VM.  Daughter at bedside, concerned that he has worsening rash on hands.    VITAL SIGNS: Temp:  [97.8 F (36.6 C)-101 F (38.3 C)] 98.9 F (37.2 C) (10/25 0800) Pulse Rate:  [25-107] 90 (10/25 0600) Resp:  [18-32] 23 (10/25 0400) BP: (109-135)/(36-69) 109/39 mmHg (10/25 0600) SpO2:  [87 %-96 %] 96 % (10/25 0600) FiO2 (%):  [45 %] 45 % (10/25 0323) Weight:  [166 lb 3.6 oz (75.4 kg)] 166 lb 3.6 oz (75.4 kg) (10/25 0453)  PHYSICAL EXAMINATION: General: ill appearing elderly male in NAD Neuro:  Eyes open, awake, speech clear at times / appropriate requests but does not answer orientation questions, generalized weakness HEENT:  PERRL, MM dry/pink, no jvd Cardiovascular:  Irregular, rate controlled AF on monitor, no m/r/g Lungs: even/non-labored, lungs bilaterally clear, diminished lower  Abdomen:  Soft, positive bowel sounds, nontender nondistended Skin:  Intact, see pics below for purpura rash on hands    Left Hand    Left Hand    Right Hand    Recent Labs Lab 07/16/15 0350 07/17/15 0150 07/18/15 0338  NA 130* 132*  134*  K 3.9 3.9 4.3  CL 99* 99* 100*  CO2 _0 BUN 31* 38* 53*  CREATININE 0.94 1.35* 1.52*  GLUCOSE 101* 128* 123*    Recent Labs Lab 07/16/15 1938 07/17/15 0150 07/18/15 0338  HGB 8.7* 8.5* 7.9*  HCT 26.5* 26.1* 24.2*  WBC 31.1* 32.4* 34.1*  PLT 290 292 280   Ct Angio Chest Pe W/cm &/or Wo Cm  07/16/2015  CLINICAL DATA:  Shortness of breath.  Generalized malaise. EXAM: CT ANGIOGRAPHY CHEST CT ABDOMEN AND PELVIS WITH CONTRAST TECHNIQUE: Multidetector CT imaging of the chest was performed using the standard protocol during bolus administration of intravenous contrast. Multiplanar CT image reconstructions and MIPs were obtained to evaluate the vascular anatomy. Multidetector CT imaging of the abdomen and pelvis was performed using the standard protocol during bolus administration of intravenous contrast. CONTRAST:  16m OMNIPAQUE IOHEXOL 350 MG/ML SOLN COMPARISON:  CT scan of abdomen pelvis of June 08, 2015. FINDINGS: CTA CHEST FINDINGS No pneumothorax is noted. Moderate bilateral pleural effusions are noted with right greater than left. Adjacent subsegmental atelectasis is noted. Fluid is noted in the left major fissure. There is no definite evidence of large central pulmonary embolus, but smaller peripheral pulmonary emboli cannot be excluded in the lower lobe branches due to respiratory motion artifact. Atherosclerosis of thoracic aorta is noted without aneurysm or dissection. Great vessels are widely patent. No significant mediastinal mass or adenopathy is noted. Coronary artery calcifications are noted. Multilevel degenerative disc disease  is noted in the lower thoracic spine. CT ABDOMEN and PELVIS FINDINGS Multilevel degenerative disc disease is noted in the lumbar spine. No gallstones are noted. The liver and pancreas appear normal. Moderate splenomegaly is noted. Rounded low density is noted in inferior portion of the spleen most consistent with hemangioma. Adrenal glands and  kidneys appear normal. No hydronephrosis or renal obstruction is noted. Atherosclerosis of abdominal aorta is noted without aneurysm formation. There is no evidence of bowel obstruction. Urinary bladder appears normal. Prostate gland appears grossly normal. Mild anasarca is noted. No abnormal fluid collection is noted. No significant adenopathy is noted. Review of the MIP images confirms the above findings. IMPRESSION: Moderate bilateral pleural effusions are noted with adjacent subsegmental atelectasis, right greater than left. No evidence of large central pulmonary embolus. Smaller peripheral pulmonary emboli cannot be excluded on the basis of this exam due to respiratory motion artifact. Atherosclerosis of thoracic aorta is noted without aneurysm or dissection. Moderate splenomegaly is noted. Atherosclerosis of abdominal aorta is noted without aneurysm formation. Mild anasarca. Electronically Signed   By: Marijo Conception, M.D.   On: 07/16/2015 15:47   Ct Abdomen Pelvis W Contrast  07/16/2015  CLINICAL DATA:  Shortness of breath.  Generalized malaise. EXAM: CT ANGIOGRAPHY CHEST CT ABDOMEN AND PELVIS WITH CONTRAST TECHNIQUE: Multidetector CT imaging of the chest was performed using the standard protocol during bolus administration of intravenous contrast. Multiplanar CT image reconstructions and MIPs were obtained to evaluate the vascular anatomy. Multidetector CT imaging of the abdomen and pelvis was performed using the standard protocol during bolus administration of intravenous contrast. CONTRAST:  171m OMNIPAQUE IOHEXOL 350 MG/ML SOLN COMPARISON:  CT scan of abdomen pelvis of June 08, 2015. FINDINGS: CTA CHEST FINDINGS No pneumothorax is noted. Moderate bilateral pleural effusions are noted with right greater than left. Adjacent subsegmental atelectasis is noted. Fluid is noted in the left major fissure. There is no definite evidence of large central pulmonary embolus, but smaller peripheral pulmonary  emboli cannot be excluded in the lower lobe branches due to respiratory motion artifact. Atherosclerosis of thoracic aorta is noted without aneurysm or dissection. Great vessels are widely patent. No significant mediastinal mass or adenopathy is noted. Coronary artery calcifications are noted. Multilevel degenerative disc disease is noted in the lower thoracic spine. CT ABDOMEN and PELVIS FINDINGS Multilevel degenerative disc disease is noted in the lumbar spine. No gallstones are noted. The liver and pancreas appear normal. Moderate splenomegaly is noted. Rounded low density is noted in inferior portion of the spleen most consistent with hemangioma. Adrenal glands and kidneys appear normal. No hydronephrosis or renal obstruction is noted. Atherosclerosis of abdominal aorta is noted without aneurysm formation. There is no evidence of bowel obstruction. Urinary bladder appears normal. Prostate gland appears grossly normal. Mild anasarca is noted. No abnormal fluid collection is noted. No significant adenopathy is noted. Review of the MIP images confirms the above findings. IMPRESSION: Moderate bilateral pleural effusions are noted with adjacent subsegmental atelectasis, right greater than left. No evidence of large central pulmonary embolus. Smaller peripheral pulmonary emboli cannot be excluded on the basis of this exam due to respiratory motion artifact. Atherosclerosis of thoracic aorta is noted without aneurysm or dissection. Moderate splenomegaly is noted. Atherosclerosis of abdominal aorta is noted without aneurysm formation. Mild anasarca. Electronically Signed   By: JMarijo Conception M.D.   On: 07/16/2015 15:47   Dg Chest Port 1 View  07/16/2015  CLINICAL DATA:  Dyspnea, LEFT side pain, flank pain, hypertension,  stage II chronic kidney disease, history atrial fibrillation EXAM: PORTABLE CHEST 1 VIEW COMPARISON:  Portable exam 0911 hours compared to 07/12/2015 FINDINGS: Enlargement of cardiac silhouette.  Atherosclerotic calcification aorta. Diffuse pulmonary infiltrates and probable RIGHT pleural effusion increased since previous exam. No pneumothorax. Bones demineralized with advanced BILATERAL glenohumeral degenerative changes and BILATERAL chronic rotator cuff tears. IMPRESSION: Enlargement of cardiac silhouette. Increased BILATERAL pulmonary infiltrates and RIGHT pleural effusion, question CHF. Electronically Signed   By: Lavonia Dana M.D.   On: 07/16/2015 09:50   US Abdomen Limited Ruq  07/16/2015  CLINICAL DATA:  Elevated LFTs. EXAM: US ABDOMEN LIMITED - RIGHT UPPER QUADRANT COMPARISON:  CT the abdomen 06/08/2015. Abdominal ultrasound 05/31/2015. FINDINGS: Gallbladder: No gallstones or wall thickening visualized. No sonographic Murphy sign noted. Gallbladder wall thickness is within normal limits a 3.0 mm. Common bile duct: Diameter: 2.0 mm, within normal limits Liver: The right lobe is somewhat prominent. Finding suggests cirrhosis. Focal lesions are present. A right pleural effusion is again noted. Trace abdominal ascites is also evident. IMPRESSION: 1. Suggestion of hepatic cirrhosis. 2. Right pleural effusion. 3. Trace abdominal ascites. 4. The gallbladder and common bile duct are within normal limits. Electronically Signed   By: San Morelle M.D.   On: 07/16/2015 12:04   Ct Extrem Up Entire Arm L W/cm  07/17/2015  CLINICAL DATA:  Left arm pain. Hand blisters. No known injury. Initial encounter. EXAM: CT OF THE UPPER LEFT EXTREMITY WITH CONTRAST TECHNIQUE: Multi detector CT imaging of the left upper extremity from shoulder through the hand was performed without intravenous contrast. Multiplanar reformatted images were generated. CONTRAST:  12m OMNIPAQUE IOHEXOL 300 MG/ML  SOLN COMPARISON:  Chest CT 07/16/2015. Left shoulder radiographs 05/31/2015. FINDINGS: There is no evidence of acute fracture or dislocation. Severe glenohumeral degenerative changes are present with joint space loss,  osteophytes and multiple intra-articular loose bodies. The subacromial space of the left shoulder is obliterated consistent with a chronic rotator cuff tear. There is erosion of the undersurface of the acromion. There are relatively mild degenerative changes at the left elbow with spurring. There is no large elbow joint effusion. There are mild degenerative changes within the wrist and hand. No focal soft tissue mass, fluid collection or abnormal enhancement identified. There is mild edema throughout the subcutaneous fat of the left upper extremity. Scattered vascular calcifications are noted. IMPRESSION: 1. No acute findings or explanation for the patient's symptoms. 2. Chronic rotator cuff tear with obliteration of the subacromial space, glenohumeral arthropathy and multiple intra-articular loose bodies. 3. Mild subcutaneous edema throughout the left upper extremity. No focal soft tissue abnormalities observed. Electronically Signed   By: WRichardean SaleM.D.   On: 07/17/2015 13:06    ASSESSMENT / PLAN: Dyspnea with hypoxemia.  Initially concerned that respiratory symptoms were secondary to heart failure (? Rate related diastolic dysfunction), pulmonary vascular congestion. He has been treated with Lasix and initially his oxygen requirements improved from a Ventimask to nasal cannula. However, he returned to VM on 10/25.  It is possible that he may have an underlying pneumonia. PE ruled out by CTA.  Recs: - Continue gentle diuresis, reduce lasix to 20 mg QD - Broad spectrum abx with vanco and zosyn.  - Hold on bone marrow biopsy - per ONC (Dr. SBenay Spice - Monitor I/O - Check cultures and procalcitonin. - Pulmonary hygiene as able: mobilize, IS - Intermittent CXR  - Xopenex Q8 PRN   Pain   Rec's  Tylenol 1gm Q6 x 24 hours IV  Avoid narcotics if possible with mental status   Purpura - on hands, progressive.  No other areas identified on body.  Senile purpura on left arm.    Rec's:  Consider  punch biopsy of hands.  Have discussed with general surgery - not sure they will complete given location of lesions?? D/c ASA per ONC   RUE Swelling   Rec's: Assess venous dupelx to r/o DVT   Atrial Fibrillation   Rec's:  Per primary SVC   Noe Gens, NP-C [facility] Pgr: [phone] or if no answer [phone] 07/18/2015, 8:24 AM

## 2015-07-18 NOTE — Progress Notes (Addendum)
PROGRESS NOTE  Jack Schultz QGB:[id number] DOB: [birth year] DOA: 06/27/2015 PCP: [name], MD  HPI/Recap of past 24 hours:  Patient is alert, confused, seems in pain, son in  Law in room,   Assessment/Plan: Principal Problem:   Atrial fibrillation with RVR ([place]) Active Problems:   Essential thrombocytosis (HCC)   Hypothyroidism   Absolute anemia   CKD (chronic kidney disease) stage 2, GFR 60-89 ml/min   Acute on chronic diastolic HF (heart failure) (HCC)   General weakness   GERD (gastroesophageal reflux disease)   Myelodysplastic syndrome (HCC)   Protein-calorie malnutrition, severe (HCC)   Decreased movement of arm   SOB (shortness of breath)   Leukocytosis   SIRS (systemic inflammatory response syndrome) (HCC)   Sepsis (HCC)   FTT (failure to thrive) in adult   Encounter for palliative care   Elbow pain   LFT elevation   FUO (fever of unknown origin)   Myalgia and myositis  Fever, ongoing intermittent fever from August per family. worsening of leukocytosis while vanc and zosyn, so far no clear source identified. Ct chest ab/pel +moderate bilateral pleural effusion which has been present during last hospitalization.  Infectious disease input appreciated, recommended d/c all abx for now, check for atypical infection including TB/viral/rheumatoid disease. Monitor fever curve.  10:30 pm Addendum: hepatitis A IgM positive?!  Left arm edema: venous ultrasound preliminary report no DVT.  Right arm edema: resistance met during picc line placement yesterday on 10/25, venous US no DVT.  Blisters/violacious skin rash mostly upper extremity and hands:  progressive from fluids over load? Drug reaction? leukemia infiltrates? Vasculitis?  vasculitis lab pending,  Hand surgeon consulted , recommended x ray and MRI on the left hand, orthopedics surgeon Dr. Erlinda Hong will see patient tomorrow 10/26  ? biopsy hand lesions.  Leukocytosis with sepsis? underline  myeloproliferative/myelodysplastic syndrome, few blast cells on peripheral blood. oncology consulted, planned Ct guided Bone marrow biopsy on 10/25 cancelled due to generalized pain.   Acute on chronic Anemia, likely from MDS, supportive prbc transfusion x1 10/22, last transfusion on 10/20. ( has required repeated blood transfusion since September 2016).  Afib/RVR with h/o heart block during hospitalization a month ago. Currently on asa $Remo'81mg'uchiQ$  and metoprolol, not a candidate for anticoagulation due to h/o myeloproliferative/? myeolodysplastic syndrome with h/o significant epistaxis. Not a candidate for pacemaker, Heart rate better, Cardiology signed off.  Hypoxia/bilateral pleural effusion:  repeat cxr bilateral infiltrate, CTA no PE, +bialteral pleural effusion. Improved on lasix, continue lasix.  Monitor creatinine, appreicte critical care input.  FTT: with recent repeated admissions to the hospital, likely will need SNF if patient able to improve from current condition  Overall poor prognosis, family reported patient has 30pounds to 40pounds of weight loss this year, multiple falls and started needing frequent blood transfusion, family (daughter in law) expressed the desire to talk to palliative care on 10/22, palliative care service consulted. Daughter Jeannene Patella) expressed wishes on 10/23 to do more tests.  Code Status: full  Family Communication: patient and son in law, tried to call daughter today on 10/25, no answer. Has been having daily family contact.  Disposition Plan: remain in stepdown, poor prognosis   Consultants:  Cardiology (signed off)  Oncology Dr. Benay Spice  Infectious disease  Critical care  Orthopedics Dr. Erlinda Hong  Procedures:  none  Antibiotics:  Vanc/zosyn from admission to 10/23.   Objective: BP 168/56 mmHg  Pulse 110  Temp(Src) 99.3 F (37.4 C) (Axillary)  Resp 24  Ht $R'5\' 11"'Ut$  (1.803  m)  Wt 166 lb 3.6 oz (75.4 kg)  BMI 23.19 kg/m2  SpO2  97%  Intake/Output Summary (Last 24 hours) at 07/18/15 1955 Last data filed at 07/18/15 1837  Gross per 24 hour  Intake    500 ml  Output   1200 ml  Net   -700 ml   Filed Weights   07/16/15 0752 07/17/15 0500 07/18/15 0453  Weight: 165 lb 2 oz (74.9 kg) 164 lb 7.4 oz (74.6 kg) 166 lb 3.6 oz (75.4 kg)    Exam:   General:  Frail, confused, seems in pain, but move bilateral arms more.  Cardiovascular: IRRR  Respiratory: diminished, no wheezing  Abdomen: Soft/ND/NT, positive BS,   Musculoskeletal: upper extremity Edema, left shoulder pain, limited range of motion, per family this is chronic  Skin: violaceous rash with few blisters  in bilateral upper extremities. More on left, blister has progressed, now larger .   Neuro: confused  Data Reviewed: Basic Metabolic Panel:  Recent Labs Lab 07/20/2015 1833  07/14/15 1925 07/15/15 0342 07/16/15 0350 07/17/15 0150 07/18/15 0338  NA 130*  < > 130* 130* 130* 132* 134*  K 3.9  < > 3.8 3.6 3.9 3.9 4.3  CL 95*  < > 98* 98* 99* 99* 100*  CO2 27  < > $R'23 25 24 24 26  'np$ GLUCOSE 126*  < > 151* 101* 101* 128* 123*  BUN 34*  < > 35* 31* 31* 38* 53*  CREATININE 1.18  < > 1.04 1.01 0.94 1.35* 1.52*  CALCIUM 8.0*  < > 7.9* 7.8* 8.2* 8.1* 8.6*  MG 2.0  --   --   --   --  2.2  --   < > = values in this interval not displayed. Liver Function Tests:  Recent Labs Lab 07/14/15 1925 07/15/15 0342 07/16/15 0350 07/16/15 1103 07/17/15 0150 07/18/15 0338  AST 73* 61* 43*  --  26 29  ALT 44 46 46  --  32 28  ALKPHOS 133* 149* 176*  --  128* 135*  BILITOT 1.3* 1.5* 1.9* 2.4* 1.9* 1.3*  PROT 5.0* 5.0* 5.1*  --  5.1* 5.2*  ALBUMIN 1.7* 1.7* 1.7*  --  1.6* 1.6*   No results for input(s): LIPASE, AMYLASE in the last 168 hours. No results for input(s): AMMONIA in the last 168 hours. CBC:  Recent Labs Lab 07/11/2015 1357  07/15/15 0342 07/16/15 0350 07/16/15 1938 07/17/15 0150 07/18/15 0338  WBC 29.6*  < > 19.7* 27.0* 31.1* 32.4* 34.1*   NEUTROABS 27.7*  --   --  25.1* 28.3* 29.8* 25.5*  HGB 7.9*  < > 7.6* 9.2* 8.7* 8.5* 7.9*  HCT 25.3*  < > 22.8* 28.5* 26.5* 26.1* 24.2*  MCV 90.2  < > 89.8 91.6 91.7 90.9 92.0  PLT 405*  < > 278 296 290 292 280  < > = values in this interval not displayed. Cardiac Enzymes:    Recent Labs Lab 07/18/2015 1833 07/14/15 0205 07/14/15 0843 07/16/15 0350 07/17/15 0150  CKTOTAL  --   --   --  19* 9*  TROPONINI 0.03 <0.03 <0.03  --   --    BNP (last 3 results)  Recent Labs  06/06/15 0512 07/12/2015 1833  BNP 425.4* 470.8*    ProBNP (last 3 results) No results for input(s): PROBNP in the last 8760 hours.  CBG:  Recent Labs Lab 07/16/15 0744  GLUCAP 98    Recent Results (from the past 240 hour(s))  TECHNOLOGIST REVIEW  Status: None   Collection Time: 06/30/2015  1:57 PM  Result Value Ref Range Status   Technologist Review   Final    Few Metas and Myelocytes present, rare blast and nRBC, hypersegmented neutrophils  Culture, blood (routine x 2) Call MD if unable to obtain prior to antibiotics being given     Status: None (Preliminary result)   Collection Time: 07/15/2015  7:30 PM  Result Value Ref Range Status   Specimen Description BLOOD LEFT ARM  Final   Special Requests BOTTLES DRAWN AEROBIC AND ANAEROBIC  10CC  Final   Culture   Final    NO GROWTH 4 DAYS Performed at [facility]    Report Status PENDING  Incomplete  Culture, blood (routine x 2) Call MD if unable to obtain prior to antibiotics being given     Status: None (Preliminary result)   Collection Time: 07/11/2015  7:52 PM  Result Value Ref Range Status   Specimen Description BLOOD RIGHT HAND  Final   Special Requests AEROBIC BOTTLE ONLY 5ML  Final   Culture   Final    NO GROWTH 4 DAYS Performed at [facility]    Report Status PENDING  Incomplete  Urine culture     Status: None   Collection Time: 06/24/2015  9:24 PM  Result Value Ref Range Status   Specimen Description URINE, CLEAN CATCH   Final   Special Requests NONE  Final   Culture   Final    NO GROWTH 1 DAY Performed at [facility]    Report Status 07/15/2015 FINAL  Final  Culture, blood (routine x 2)     Status: None (Preliminary result)   Collection Time: 07/16/15 11:03 AM  Result Value Ref Range Status   Specimen Description BLOOD LEFT ANTECUBITAL  Final   Special Requests BOTTLES DRAWN AEROBIC AND ANAEROBIC 10CC  Final   Culture   Final    NO GROWTH 2 DAYS Performed at [facility]    Report Status PENDING  Incomplete  Culture, blood (routine x 2)     Status: None (Preliminary result)   Collection Time: 07/16/15 11:10 AM  Result Value Ref Range Status   Specimen Description BLOOD LEFT HAND  Final   Special Requests BOTTLES DRAWN AEROBIC AND ANAEROBIC 10CC  Final   Culture   Final    NO GROWTH 2 DAYS Performed at [facility]    Report Status PENDING  Incomplete     Studies: Dg Hand Complete Left  07/18/2015  CLINICAL DATA:  Diagnosis on xray order blue blisters medial thumb, entire pointer finger from mcp to distal pip, medial middle finger at proximal pip, medial ring finger at pip, medial pinky at pip EXAM: LEFT HAND - COMPLETE 3+ VIEW COMPARISON:  Left wrist radiographs, 06/01/2015 FINDINGS: There is soft tissue thickening along the mid to proximal aspect of the index finger, along the radial aspect. Milder more diffuse soft tissue swelling is noted along the fifth finger. There is no evidence of an acute fracture. There is no dislocation. There is joint space narrowing with subchondral sclerosis and small marginal osteophytes at the first carpal metacarpal articulation. Minor asymmetric joint space narrowing with marginal osteophytes is noted involving the DIP joints of the fingers, IP joint of the thumb and PIP joint of the index finger. These findings are consistent with osteoarthritis. Vascular calcifications are noted along the wrist arteries. IMPRESSION: 1. No acute fracture or  dislocation. 2. Nonspecific soft tissue thickening  along the radial aspect of the index finger more diffuse and milder soft tissue swelling noted of the fifth finger. 3. Osteoarthritis. Electronically Signed   By: Lajean Manes M.D.   On: 07/18/2015 17:15    Scheduled Meds: . sodium chloride  250 mL Intravenous Once  . acetaminophen  1,000 mg Intravenous 4 times per day  . antiseptic oral rinse  7 mL Mouth Rinse BID  . feeding supplement (ENSURE ENLIVE)  237 mL Oral TID BM  . ferrous sulfate  325 mg Oral Q breakfast  . furosemide  20 mg Intravenous Daily  . levothyroxine  50 mcg Oral QAC breakfast  . metoprolol  5 mg Intravenous Q8H  . potassium chloride SA  20 mEq Oral Daily  . sodium chloride  3 mL Intravenous Q12H    Continuous Infusions:     Time spent: 82mins  Pheobe Sandiford MD, PhD  [facility] Pager [phone]. If 7PM-7AM, please contact night-coverage at [email or web address], password [email or web address] 07/18/2015, 7:55 PM  LOS: 5 days

## 2015-07-18 NOTE — Progress Notes (Signed)
PT Cancellation Note  Patient Details Name: Jack Schultz MRN: [id number] DOB: 1924-03-19   Cancelled Treatment:    Reason Eval/Treat Not Completed: Medical issues which prohibited therapy   [name] 07/18/2015, 3:32 PM Tresa Endo PT [phone]

## 2015-07-19 ENCOUNTER — Ambulatory Visit: Payer: Self-pay | Admitting: Internal Medicine

## 2015-07-19 ENCOUNTER — Inpatient Hospital Stay (HOSPITAL_COMMUNITY): Payer: Medicare Other

## 2015-07-19 DIAGNOSIS — D469 Myelodysplastic syndrome, unspecified: Secondary | ICD-10-CM | POA: Insufficient documentation

## 2015-07-19 LAB — ANTI-DNA ANTIBODY, DOUBLE-STRANDED: ds DNA Ab: <1 IU/mL (ref 0–9)

## 2015-07-19 LAB — CBC
HEMATOCRIT: 24.1 % — AB (ref 39.0–52.0)
HEMOGLOBIN: 7.6 g/dL — AB (ref 13.0–17.0)
MCH: 29.6 pg (ref 26.0–34.0)
MCHC: 31.5 g/dL (ref 30.0–36.0)
MCV: 93.8 fL (ref 78.0–100.0)
Platelets: 238 10*3/uL (ref 150–400)
RBC: 2.57 MIL/uL — ABNORMAL LOW (ref 4.22–5.81)
RDW: 20.7 % — ABNORMAL HIGH (ref 11.5–15.5)
WBC: 38.1 10*3/uL — AB (ref 4.0–10.5)

## 2015-07-19 LAB — COMPREHENSIVE METABOLIC PANEL
ALBUMIN: 1.7 g/dL — AB (ref 3.5–5.0)
ALK PHOS: 127 U/L — AB (ref 38–126)
ALT: 21 U/L (ref 17–63)
AST: 19 U/L (ref 15–41)
Anion gap: 7 (ref 5–15)
BILIRUBIN TOTAL: 1.1 mg/dL (ref 0.3–1.2)
BUN: 59 mg/dL — AB (ref 6–20)
CALCIUM: 8.6 mg/dL — AB (ref 8.9–10.3)
CO2: 27 mmol/L (ref 22–32)
CREATININE: 1.64 mg/dL — AB (ref 0.61–1.24)
Chloride: 103 mmol/L (ref 101–111)
GFR calc Af Amer: 41 mL/min — ABNORMAL LOW (ref >60)
GFR, EST NON AFRICAN AMERICAN: 35 mL/min — AB (ref >60)
GLUCOSE: 110 mg/dL — AB (ref 65–99)
Potassium: 4 mmol/L (ref 3.5–5.1)
Sodium: 137 mmol/L (ref 135–145)
TOTAL PROTEIN: 5.3 g/dL — AB (ref 6.5–8.1)

## 2015-07-19 LAB — QUANTIFERON IN TUBE
QUANTIFERON MITOGEN VALUE: 0.05 [IU]/mL
QUANTIFERON TB AG VALUE: 0.07 IU/mL
QUANTIFERON TB GOLD: UNDETERMINED
Quantiferon Nil Value: 0.09 IU/mL

## 2015-07-19 LAB — EPSTEIN-BARR VIRUS VCA ANTIBODY PANEL
EBV Early Antigen Ab, IgG: <9 U/mL (ref 0.0–8.9)
EBV NA IgG: >600 U/mL — ABNORMAL HIGH (ref 0.0–17.9)

## 2015-07-19 LAB — SJOGRENS SYNDROME-A EXTRACTABLE NUCLEAR ANTIBODY: SSA (Ro) (ENA) Antibody, IgG: <0.2 AI (ref 0.0–0.9)

## 2015-07-19 LAB — CULTURE, BLOOD (ROUTINE X 2)
CULTURE: NO GROWTH
Culture: NO GROWTH

## 2015-07-19 LAB — CMV ANTIBODY, IGG (EIA): CMV Ab - IgG: >10 U/mL — ABNORMAL HIGH (ref 0.00–0.59)

## 2015-07-19 LAB — SJOGRENS SYNDROME-B EXTRACTABLE NUCLEAR ANTIBODY: SSB (La) (ENA) Antibody, IgG: <0.2 AI (ref 0.0–0.9)

## 2015-07-19 LAB — OCCULT BLOOD X 1 CARD TO LAB, STOOL: Fecal Occult Bld: NEGATIVE

## 2015-07-19 LAB — QUANTIFERON TB GOLD ASSAY (BLOOD)

## 2015-07-19 LAB — CYCLIC CITRUL PEPTIDE ANTIBODY, IGG/IGA: CCP Antibodies IgG/IgA: 5 units (ref 0–19)

## 2015-07-19 LAB — CMV IGM: CMV IgM: 72.4 AU/mL — ABNORMAL HIGH (ref 0.0–29.9)

## 2015-07-19 MED ORDER — SODIUM CHLORIDE 0.9 % IV BOLUS (SEPSIS)
500.0000 mL | Freq: Once | INTRAVENOUS | Status: AC
  Administered 2015-07-19: 500 mL via INTRAVENOUS

## 2015-07-19 MED ORDER — SODIUM CHLORIDE 0.9 % IV SOLN
INTRAVENOUS | Status: DC
  Administered 2015-07-19 – 2015-07-21 (×3): via INTRAVENOUS

## 2015-07-19 MED ORDER — MORPHINE SULFATE (PF) 2 MG/ML IV SOLN
1.0000 mg | INTRAVENOUS | Status: DC | PRN
  Administered 2015-07-19 – 2015-07-20 (×3): 1 mg via INTRAVENOUS
  Filled 2015-07-19 (×3): qty 1

## 2015-07-19 MED ORDER — BACITRACIN-NEOMYCIN-POLYMYXIN OINTMENT TUBE
TOPICAL_OINTMENT | Freq: Two times a day (BID) | CUTANEOUS | Status: DC
  Administered 2015-07-19 – 2015-07-20 (×3): via TOPICAL
  Administered 2015-07-21: 2 via TOPICAL
  Administered 2015-07-22: 12:00:00 via TOPICAL
  Filled 2015-07-19 (×3): qty 15

## 2015-07-19 MED ORDER — SODIUM CHLORIDE 0.9 % IV BOLUS (SEPSIS)
250.0000 mL | Freq: Once | INTRAVENOUS | Status: AC
  Administered 2015-07-20: 250 mL via INTRAVENOUS

## 2015-07-19 MED ORDER — LIDOCAINE-EPINEPHRINE 1 %-1:100000 IJ SOLN
10.0000 mL | Freq: Once | INTRAMUSCULAR | Status: DC
  Filled 2015-07-19: qty 10

## 2015-07-19 NOTE — Progress Notes (Signed)
MRI reported to RN that, after speaking with family, it has been determined the patient will not be able to have the MRI of the Left hand performed due to patient being unable to move the left arm above his head and/or unable to lay prone for the procedure. The family is aware and understanding. This information will be passed along in report. Luther Parody, RN

## 2015-07-19 NOTE — Progress Notes (Signed)
Physical Therapy Discharge Patient Details Name: Jack Schultz MRN: [id number] DOB: May 09, 1924 Today's Date: 07/19/2015 Time:  -     Patient discharged from PT services secondary to medical decline - will need to re-order PT to resume therapy services. Patient has been in too much pain with UE's.  Please see latest therapy progress note for current level of functioning and progress toward goals.     GP     Marcelino Freestone PT [phone]  07/19/2015, 2:58 PM

## 2015-07-19 NOTE — Progress Notes (Signed)
Patient presents with elevated temp of 101.1 ax then rechecked 102.7 rectally--Tylendol supp. Given, MD notified. SRP, RN

## 2015-07-19 NOTE — Care Management Important Message (Signed)
Important Message  Patient Details  Name: [name] MRN: [id number] Date of Birth: [birth year]   Medicare Important Message Given:  Yes-second notification given    Camillo Flaming 07/19/2015, 11:27 AMImportant Message  Patient Details  Name: [name] MRN: [id number] Date of Birth: July 15, 1924   Medicare Important Message Given:  Yes-second notification given    Camillo Flaming 07/19/2015, 11:27 AM

## 2015-07-19 NOTE — Progress Notes (Signed)
   07/19/15 1400  Clinical Encounter Type  Visited With Family;Patient and family together (friends)  Visit Type Initial;Psychological support;Spiritual support;Social support  Referral From Palliative care team  Consult/Referral To Chaplain  Spiritual Encounters  Spiritual Needs Emotional;Prayer  Stress Factors  Family Stress Factors Lack of knowledge  Jack Schultz visited with friends and family at bedside; daughters not present; pt non-responsive; [place] mentioned spiritual care and support; talked with family about concerns, primarily lack of knowledge/definitive diagnosis; Spiritual and emotional support offered; follow-up with daughters after collaboration with Palliative Care team.  Jack Schultz 2:14 PM

## 2015-07-19 NOTE — Progress Notes (Signed)
OT Cancellation Note and Discharge Note  Patient Details Name: SIDDHARTH BABINGTON MRN: [id number] DOB: [birth year]   Cancelled Treatment:    Reason Eval/Treat Not Completed: Other (comment).  Will sign off from OT.  Pt with medical decline, decreased responsiveness.  It pt would benefit at a later time, please reorder. Thanks.  Keymiah Lyles 07/19/2015, 11:11 AM  Lesle Chris, OTR/L [phone] 07/19/2015

## 2015-07-19 NOTE — Progress Notes (Signed)
Per RN report, patient cannot tolerate an MRI.  I will evaluate the patient later today.

## 2015-07-19 NOTE — Progress Notes (Signed)
[facility] Progress Note Patient Name: Jack Schultz DOB: Jan 31, 1924 MRN: [id number]   Date of Service  07/19/2015  HPI/Events of Note  E ICU rounds Vitals stable, he appears stable on camera check, RN reports he is stable for floor We need ICU beds   eICU Interventions  Transfer to telemetry        [facility] 07/19/2015, 12:08 AM

## 2015-07-19 NOTE — Consult Note (Signed)
ORTHOPAEDIC CONSULTATION  REQUESTING PHYSICIAN: Kelvin Cellar, MD  Chief Complaint: Hand blisters  HPI: Jack Schultz is a 81 y.o. male who presents with worsening bilateral hand blood blisters within the last few days.  Patient has had red spots on his hands since September but has rapidly enlarged in the last few days.  The patient nonverbal at bedside today.  He does have myeloproliferative disorder and has been declining in health in the last few months.  Has persistent leukocytosis.  ROS not possible due to patient being nonverbal.  Past Medical History  Diagnosis Date  . Hypertension   . Hyperlipidemia   . Pre-diabetes   . Thyroid disease   . Thrombocytosis ([place])   . GERD (gastroesophageal reflux disease)   . Vitamin D deficiency   . Vision abnormalities   . Neuropathy ([place])   . Syncope and collapse    Past Surgical History  Procedure Laterality Date  . No past surgeries     Social History   Social History  . Marital Status: Married    Spouse Name: N/A  . Number of Children: N/A  . Years of Education: N/A   Social History Main Topics  . Smoking status: Former Smoker    Types: Cigarettes    Quit date: 09/24/1975  . Smokeless tobacco: Former Systems developer    Types: Snuff    Quit date: 05/31/2015  . Alcohol Use: No  . Drug Use: No  . Sexual Activity: No   Other Topics Concern  . None   Social History Narrative   Family History  Problem Relation Age of Onset  . Hypertension Father   . Heart disease Father    Allergies  Allergen Reactions  . Ace Inhibitors Cough   Prior to Admission medications   Medication Sig Start Date End Date Taking? Authorizing Provider  acetaminophen (TYLENOL) 500 MG tablet Take 500 mg by mouth every 6 (six) hours as needed for headache.   Yes Historical Provider, MD  aspirin 81 MG tablet Take 81 mg by mouth daily.   Yes Historical Provider, MD  cefdinir (OMNICEF) 300 MG capsule Take 300 mg by mouth at bedtime. ABT Start Date  07/07/15 & End Date 07/01/2015.   Yes Historical Provider, MD  Cholecalciferol (VITAMIN D PO) Take 5,000 Units by mouth daily.   Yes Historical Provider, MD  feeding supplement, ENSURE ENLIVE, (ENSURE ENLIVE) LIQD Take 237 mLs by mouth 2 (two) times daily between meals. Patient taking differently: Take 237 mLs by mouth 4 (four) times daily.  06/13/15  Yes Belkys A Regalado, MD  ferrous sulfate 325 (65 FE) MG tablet Take 325 mg by mouth daily with breakfast.   Yes Historical Provider, MD  furosemide (LASIX) 40 MG tablet Take 40 mg by mouth daily.   Yes Historical Provider, MD  hydroxyurea (HYDREA) 500 MG capsule Take 500 mg by mouth daily.   Yes Historical Provider, MD  levothyroxine (SYNTHROID, LEVOTHROID) 50 MCG tablet Take 50 mcg by mouth daily.   Yes Historical Provider, MD  metoprolol tartrate (LOPRESSOR) 25 MG tablet Take 0.5 tablets (12.5 mg total) by mouth 2 (two) times daily. Patient taking differently: Take 25 mg by mouth 2 (two) times daily.  06/23/15  Yes Liliane Shi, PA-C  potassium chloride SA (K-DUR,KLOR-CON) 20 MEQ tablet Take 1 tablet (20 mEq total) by mouth daily. 06/13/15  Yes Belkys A Regalado, MD  acetaminophen (TYLENOL) 650 MG suppository Place 1 suppository (650 mg total) rectally every 6 (six) hours as  needed for mild pain (or Fever >/= 101). 06/13/15   Belkys A Regalado, MD  polyethylene glycol (MIRALAX / GLYCOLAX) packet Take 17 g by mouth daily as needed for mild constipation. 06/13/15   Elmarie Shiley, MD   Dg Chest Port 1 View  07/19/2015  CLINICAL DATA:  Dyspnea, history of CHF, chronic renal insufficiency, and fever of unknown origin. EXAM: PORTABLE CHEST 1 VIEW COMPARISON:  CT scan of the chest and portable chest x-ray of July 16, 2015. FINDINGS: The lungs are well-expanded. The pulmonary interstitial markings remain increased but have improved slightly. The hemidiaphragms are better demonstrated. Known pleural effusions are not clearly demonstrated. The cardiac  silhouette remains enlarged. The central pulmonary vascularity is mildly prominent. There is mild tortuosity of the descending thoracic aorta. There is calcification in the wall of the aortic arch. There is multilevel degenerative disc disease of the thoracic spine with a compression fracture of approximately T9. IMPRESSION: Mild interval improvement of the pulmonary interstitium may reflect ongoing resolution of interstitial edema or pneumonia. Significant interstitial abnormalities remain. Electronically Signed   By: David  Martinique M.D.   On: 07/19/2015 07:17   Dg Hand Complete Left  07/18/2015  CLINICAL DATA:  Diagnosis on xray order blue blisters medial thumb, entire pointer finger from mcp to distal pip, medial middle finger at proximal pip, medial ring finger at pip, medial pinky at pip EXAM: LEFT HAND - COMPLETE 3+ VIEW COMPARISON:  Left wrist radiographs, 06/01/2015 FINDINGS: There is soft tissue thickening along the mid to proximal aspect of the index finger, along the radial aspect. Milder more diffuse soft tissue swelling is noted along the fifth finger. There is no evidence of an acute fracture. There is no dislocation. There is joint space narrowing with subchondral sclerosis and small marginal osteophytes at the first carpal metacarpal articulation. Minor asymmetric joint space narrowing with marginal osteophytes is noted involving the DIP joints of the fingers, IP joint of the thumb and PIP joint of the index finger. These findings are consistent with osteoarthritis. Vascular calcifications are noted along the wrist arteries. IMPRESSION: 1. No acute fracture or dislocation. 2. Nonspecific soft tissue thickening along the radial aspect of the index finger more diffuse and milder soft tissue swelling noted of the fifth finger. 3. Osteoarthritis. Electronically Signed   By: Lajean Manes M.D.   On: 07/18/2015 17:15    Positive ROS: All other systems have been reviewed and were otherwise negative  with the exception of those mentioned in the HPI and as above.  Physical Exam: General: NAD Cardiovascular: No pedal edema Respiratory: No cyanosis, no use of accessory musculature GI: No organomegaly, abdomen is soft and non-tender Skin: multiple blood blisters of the hand, no cellulitis, no purulence Neurologic: Sensation intact distally Psychiatric: Patient is nonverbal Lymphatic: No axillary or cervical lymphadenopathy  MUSCULOSKELETAL:  - bilateral hand with blood blisters - no cellulitis or induration - blisters are tender to palpation - hand wwp  Assessment: Bilateral hand blisters  Plan: - agree that this does not appear to be an infectious process but rather a manifestation of his myeloproliferative disorder - however, I will discuss this with colleagues who may have had experience with this matter - could consider aspiration of the one of the blisters but this may introduce infection into an otherwise sterile environment - for now, keep both hands moist with bacitracin ointment and kerlix - agree to hold abx  Thank you for the consult and the opportunity to see Mr. Misch  Azucena Cecil, MD [facility] [phone] 6:15 PM

## 2015-07-19 NOTE — Progress Notes (Signed)
PROGRESS NOTE  Jack Schultz:[id number] DOB: [birth year] DOA: 06/30/2015 PCP: [name], MD  HPI/Recap of past 24 hours:  Patient is alert, confused, seems in pain, son in  Law in room,   Assessment/Plan: Principal Problem:   Atrial fibrillation with RVR ([place]) Active Problems:   Essential thrombocytosis (HCC)   Hypothyroidism   Absolute anemia   CKD (chronic kidney disease) stage 2, GFR 60-89 ml/min   Acute on chronic diastolic HF (heart failure) (HCC)   General weakness   GERD (gastroesophageal reflux disease)   Myelodysplastic syndrome (HCC)   Protein-calorie malnutrition, severe (HCC)   Decreased movement of arm   SOB (shortness of breath)   Leukocytosis   SIRS (systemic inflammatory response syndrome) (HCC)   Sepsis (HCC)   FTT (failure to thrive) in adult   Encounter for palliative care   Elbow pain   LFT elevation   FUO (fever of unknown origin)   Myalgia and myositis  Fever of unknown origin,  -This is associated with upward trend in his white count, today at 38,100. - He has been treated with vanc and zosyn up until today which was discontinued by Dr. Linus Salmons of infectious disease as infectious process is unlikely at this point. He has been on multiple antimicrobial regimens without showing significant response, and has a partially shown progressive decline.  -I think a hematological malignancy  is on the differential. It appears he has a history of myelodysplastic syndrome.  -Bone marrow biopsy was obtained however could not be performed due to inability of patient to cooperate.  -Left arm edema: venous ultrasound preliminary report no DVT.  -Right arm edema: resistance met during picc line placement yesterday on 10/25, venous US no DVT.  -Blisters/violacious skin rash -I wonder about the possibility of this being related to vasculitis or other autoimmune cause which may have been precipitated by malignancy. -Skin biopsy has been considered for  which surgery was consulted  Myeloproliferative/myelodysplastic syndrome,  -few blast cells on peripheral blood. oncology consulted, planned Ct guided Bone marrow biopsy on 10/25 cancelled due to generalized pain.   Acute on chronic Anemia,  -likely from MDS, supportive prbc transfusion x1 10/22, last transfusion on 10/20. ( has required repeated blood transfusion since September 2016).  Afib/RVR  -with h/o heart block during hospitalization a month ago. Currently on asa $Remo'81mg'IeAmF$  and metoprolol, not a candidate for anticoagulation due to h/o myeloproliferative/? myeolodysplastic syndrome with h/o significant epistaxis. Not a candidate for pacemaker, Heart rate better, Cardiology signed off.  Hypoxia/bilateral pleural effusion:  repeat cxr bilateral infiltrate, CTA no PE, +bialteral pleural effusion. Improved on lasix, continue lasix.  Monitor creatinine, appreicte critical care input.   Code Status: CODE STATUS discussed with patient's son and daughter, he is a DO NOT RESUSCITATE.  Family Communication: Family discussion held, we discussed prognosis, goals of care and CODE STATUS on 07/19/2015. Disposition Plan:    Consultants:  Cardiology (signed off)  Oncology Dr. Benay Spice  Infectious disease  Critical care  Orthopedics Dr. Erlinda Hong  Procedures:  none  Antibiotics:  Vanc/zosyn from admission to 10/23.   Objective: BP 141/65 mmHg  Pulse 99  Temp(Src) 99 F (37.2 C) (Axillary)  Resp 20  Ht $R'5\' 11"'JB$  (1.803 m)  Wt 72.3 kg (159 lb 6.3 oz)  BMI 22.24 kg/m2  SpO2 96%  Intake/Output Summary (Last 24 hours) at 07/19/15 1758 Last data filed at 07/19/15 1518  Gross per 24 hour  Intake    310 ml  Output   1560  ml  Net  -1250 ml   Filed Weights   07/18/15 0453 07/19/15 0122 07/19/15 0431  Weight: 75.4 kg (166 lb 3.6 oz) 73.256 kg (161 lb 8 oz) 72.3 kg (159 lb 6.3 oz)    Exam:   General:  Frail, confused, seems in pain, but move bilateral arms more.  Cardiovascular:  IRRR  Respiratory: diminished, no wheezing  Abdomen: Soft/ND/NT, positive BS,   Musculoskeletal: upper extremity Edema, left shoulder pain, limited range of motion, per family this is chronic  Skin: violaceous rash with few blisters  in bilateral upper extremities. More on left, blister has progressed, now larger .   Neuro: confused  Data Reviewed: Basic Metabolic Panel:  Recent Labs Lab 07/19/2015 1833  07/15/15 0342 07/16/15 0350 07/17/15 0150 07/18/15 0338 07/19/15 0535  NA 130*  < > 130* 130* 132* 134* 137  K 3.9  < > 3.6 3.9 3.9 4.3 4.0  CL 95*  < > 98* 99* 99* 100* 103  CO2 27  < > $R'25 24 24 26 27  'ft$ GLUCOSE 126*  < > 101* 101* 128* 123* 110*  BUN 34*  < > 31* 31* 38* 53* 59*  CREATININE 1.18  < > 1.01 0.94 1.35* 1.52* 1.64*  CALCIUM 8.0*  < > 7.8* 8.2* 8.1* 8.6* 8.6*  MG 2.0  --   --   --  2.2  --   --   < > = values in this interval not displayed. Liver Function Tests:  Recent Labs Lab 07/15/15 0342 07/16/15 0350 07/16/15 1103 07/17/15 0150 07/18/15 0338 07/19/15 0535  AST 61* 43*  --  $R'26 29 19  'Fu$ ALT 46 46  --  32 28 21  ALKPHOS 149* 176*  --  128* 135* 127*  BILITOT 1.5* 1.9* 2.4* 1.9* 1.3* 1.1  PROT 5.0* 5.1*  --  5.1* 5.2* 5.3*  ALBUMIN 1.7* 1.7*  --  1.6* 1.6* 1.7*   No results for input(s): LIPASE, AMYLASE in the last 168 hours. No results for input(s): AMMONIA in the last 168 hours. CBC:  Recent Labs Lab 06/27/2015 1357  07/16/15 0350 07/16/15 1938 07/17/15 0150 07/18/15 0338 07/19/15 0535  WBC 29.6*  < > 27.0* 31.1* 32.4* 34.1* 38.1*  NEUTROABS 27.7*  --  25.1* 28.3* 29.8* 25.5*  --   HGB 7.9*  < > 9.2* 8.7* 8.5* 7.9* 7.6*  HCT 25.3*  < > 28.5* 26.5* 26.1* 24.2* 24.1*  MCV 90.2  < > 91.6 91.7 90.9 92.0 93.8  PLT 405*  < > 296 290 292 280 238  < > = values in this interval not displayed. Cardiac Enzymes:    Recent Labs Lab 07/08/2015 1833 07/14/15 0205 07/14/15 0843 07/16/15 0350 07/17/15 0150  CKTOTAL  --   --   --  19* 9*  TROPONINI  0.03 <0.03 <0.03  --   --    BNP (last 3 results)  Recent Labs  06/06/15 0512 07/14/2015 1833  BNP 425.4* 470.8*    ProBNP (last 3 results) No results for input(s): PROBNP in the last 8760 hours.  CBG:  Recent Labs Lab 07/16/15 0744  GLUCAP 98    Recent Results (from the past 240 hour(s))  TECHNOLOGIST REVIEW     Status: None   Collection Time: 07/16/2015  1:57 PM  Result Value Ref Range Status   Technologist Review   Final    Few Metas and Myelocytes present, rare blast and nRBC, hypersegmented neutrophils  Culture, blood (routine x 2) Call MD  if unable to obtain prior to antibiotics being given     Status: None   Collection Time: 06/24/2015  7:30 PM  Result Value Ref Range Status   Specimen Description BLOOD LEFT ARM  Final   Special Requests BOTTLES DRAWN AEROBIC AND ANAEROBIC  10CC  Final   Culture   Final    NO GROWTH 5 DAYS Performed at [facility]    Report Status 07/19/2015 FINAL  Final  Culture, blood (routine x 2) Call MD if unable to obtain prior to antibiotics being given     Status: None   Collection Time: 06/26/2015  7:52 PM  Result Value Ref Range Status   Specimen Description BLOOD RIGHT HAND  Final   Special Requests AEROBIC BOTTLE ONLY 5ML  Final   Culture   Final    NO GROWTH 5 DAYS Performed at [facility]    Report Status 07/19/2015 FINAL  Final  Urine culture     Status: None   Collection Time: 07/06/2015  9:24 PM  Result Value Ref Range Status   Specimen Description URINE, CLEAN CATCH  Final   Special Requests NONE  Final   Culture   Final    NO GROWTH 1 DAY Performed at [facility]    Report Status 07/15/2015 FINAL  Final  Culture, blood (routine x 2)     Status: None (Preliminary result)   Collection Time: 07/16/15 11:03 AM  Result Value Ref Range Status   Specimen Description BLOOD LEFT ANTECUBITAL  Final   Special Requests BOTTLES DRAWN AEROBIC AND ANAEROBIC 10CC  Final   Culture   Final    NO GROWTH 3  DAYS Performed at [facility]    Report Status PENDING  Incomplete  Culture, blood (routine x 2)     Status: None (Preliminary result)   Collection Time: 07/16/15 11:10 AM  Result Value Ref Range Status   Specimen Description BLOOD LEFT HAND  Final   Special Requests BOTTLES DRAWN AEROBIC AND ANAEROBIC 10CC  Final   Culture   Final    NO GROWTH 3 DAYS Performed at [facility]    Report Status PENDING  Incomplete     Studies: Dg Chest Port 1 View  07/19/2015  CLINICAL DATA:  Dyspnea, history of CHF, chronic renal insufficiency, and fever of unknown origin. EXAM: PORTABLE CHEST 1 VIEW COMPARISON:  CT scan of the chest and portable chest x-ray of July 16, 2015. FINDINGS: The lungs are well-expanded. The pulmonary interstitial markings remain increased but have improved slightly. The hemidiaphragms are better demonstrated. Known pleural effusions are not clearly demonstrated. The cardiac silhouette remains enlarged. The central pulmonary vascularity is mildly prominent. There is mild tortuosity of the descending thoracic aorta. There is calcification in the wall of the aortic arch. There is multilevel degenerative disc disease of the thoracic spine with a compression fracture of approximately T9. IMPRESSION: Mild interval improvement of the pulmonary interstitium may reflect ongoing resolution of interstitial edema or pneumonia. Significant interstitial abnormalities remain. Electronically Signed   By: David  Martinique M.D.   On: 07/19/2015 07:17    Scheduled Meds: . sodium chloride  250 mL Intravenous Once  . antiseptic oral rinse  7 mL Mouth Rinse BID  . feeding supplement (ENSURE ENLIVE)  237 mL Oral TID BM  . ferrous sulfate  325 mg Oral Q breakfast  . levothyroxine  50 mcg Oral QAC breakfast  . lidocaine-EPINEPHrine  10 mL Intradermal Once  . metoprolol  5 mg Intravenous Q8H  . potassium chloride SA  20 mEq Oral Daily  . sodium chloride  3 mL Intravenous Q12H     Continuous Infusions: . sodium chloride 75 mL/hr at 07/19/15 1015     Time spent: 45 mins; 30 minutes spent discussing goals of care with patient's son and daughter at bedside.   Kelvin Cellar MD,  [facility] Pager [phone]. If 7PM-7AM, please contact night-coverage at [email or web address], password [facility] 07/19/2015, 5:58 PM  LOS: 6 days

## 2015-07-19 NOTE — Progress Notes (Signed)
Met with daughter at bedside- still being worked up per her report. Patient admitted from [facility] where he was in SNF rehab for about 2.5 weeks- prior to that, he was at home with his wife who is primary caregiver for due to her needs s/p CVA.  CSW updated [facility] and will follow to assist with dc back to SNF once stable.   Eduard Clos, MSW, [place]

## 2015-07-19 NOTE — Progress Notes (Signed)
Report called to 4W-patient will be transported and moved to room 1429. Luther Parody, RN

## 2015-07-19 NOTE — Progress Notes (Signed)
[facility] for Infectious Disease   Date of Admission:  07/11/2015  Antibiotics: none  Subjective: Continued decline  Objective:  Filed Vitals:   07/19/15 1517  BP: 141/65  Pulse: 99  Temp: 99 F (37.2 C)  Resp: 20     Review of systems not obtained due to patient factors.  Lab Results Lab Results  Component Value Date   WBC 38.1* 07/19/2015   HGB 7.6* 07/19/2015   HCT 24.1* 07/19/2015   MCV 93.8 07/19/2015   PLT 238 07/19/2015    Lab Results  Component Value Date   CREATININE 1.64* 07/19/2015   BUN 59* 07/19/2015   NA 137 07/19/2015   K 4.0 07/19/2015   CL 103 07/19/2015   CO2 27 07/19/2015    Lab Results  Component Value Date   ALT 21 07/19/2015   AST 19 07/19/2015   ALKPHOS 127* 07/19/2015   BILITOT 1.1 07/19/2015      Microbiology: Recent Results (from the past 240 hour(s))  TECHNOLOGIST REVIEW     Status: None   Collection Time: 06/25/2015  1:57 PM  Result Value Ref Range Status   Technologist Review   Final    Few Metas and Myelocytes present, rare blast and nRBC, hypersegmented neutrophils  Culture, blood (routine x 2) Call MD if unable to obtain prior to antibiotics being given     Status: None   Collection Time: 07/12/2015  7:30 PM  Result Value Ref Range Status   Specimen Description BLOOD LEFT ARM  Final   Special Requests BOTTLES DRAWN AEROBIC AND ANAEROBIC  10CC  Final   Culture   Final    NO GROWTH 5 DAYS Performed at [facility]    Report Status 07/19/2015 FINAL  Final  Culture, blood (routine x 2) Call MD if unable to obtain prior to antibiotics being given     Status: None   Collection Time: 07/20/2015  7:52 PM  Result Value Ref Range Status   Specimen Description BLOOD RIGHT HAND  Final   Special Requests AEROBIC BOTTLE ONLY 5ML  Final   Culture   Final    NO GROWTH 5 DAYS Performed at [facility]    Report Status 07/19/2015 FINAL  Final  Urine culture     Status: None   Collection Time: 07/15/2015  9:24  PM  Result Value Ref Range Status   Specimen Description URINE, CLEAN CATCH  Final   Special Requests NONE  Final   Culture   Final    NO GROWTH 1 DAY Performed at [facility]    Report Status 07/15/2015 FINAL  Final  Culture, blood (routine x 2)     Status: None (Preliminary result)   Collection Time: 07/16/15 11:03 AM  Result Value Ref Range Status   Specimen Description BLOOD LEFT ANTECUBITAL  Final   Special Requests BOTTLES DRAWN AEROBIC AND ANAEROBIC 10CC  Final   Culture   Final    NO GROWTH 3 DAYS Performed at [facility]    Report Status PENDING  Incomplete  Culture, blood (routine x 2)     Status: None (Preliminary result)   Collection Time: 07/16/15 11:10 AM  Result Value Ref Range Status   Specimen Description BLOOD LEFT HAND  Final   Special Requests BOTTLES DRAWN AEROBIC AND ANAEROBIC 10CC  Final   Culture   Final    NO GROWTH 3 DAYS Performed at [facility]    Report Status PENDING  Incomplete    Studies/Results: Dg Chest Port 1 View  07/19/2015  CLINICAL DATA:  Dyspnea, history of CHF, chronic renal insufficiency, and fever of unknown origin. EXAM: PORTABLE CHEST 1 VIEW COMPARISON:  CT scan of the chest and portable chest x-ray of July 16, 2015. FINDINGS: The lungs are well-expanded. The pulmonary interstitial markings remain increased but have improved slightly. The hemidiaphragms are better demonstrated. Known pleural effusions are not clearly demonstrated. The cardiac silhouette remains enlarged. The central pulmonary vascularity is mildly prominent. There is mild tortuosity of the descending thoracic aorta. There is calcification in the wall of the aortic arch. There is multilevel degenerative disc disease of the thoracic spine with a compression fracture of approximately T9. IMPRESSION: Mild interval improvement of the pulmonary interstitium may reflect ongoing resolution of interstitial edema or pneumonia. Significant interstitial  abnormalities remain. Electronically Signed   By: David  Martinique M.D.   On: 07/19/2015 07:17   Dg Hand Complete Left  07/18/2015  CLINICAL DATA:  Diagnosis on xray order blue blisters medial thumb, entire pointer finger from mcp to distal pip, medial middle finger at proximal pip, medial ring finger at pip, medial pinky at pip EXAM: LEFT HAND - COMPLETE 3+ VIEW COMPARISON:  Left wrist radiographs, 06/01/2015 FINDINGS: There is soft tissue thickening along the mid to proximal aspect of the index finger, along the radial aspect. Milder more diffuse soft tissue swelling is noted along the fifth finger. There is no evidence of an acute fracture. There is no dislocation. There is joint space narrowing with subchondral sclerosis and small marginal osteophytes at the first carpal metacarpal articulation. Minor asymmetric joint space narrowing with marginal osteophytes is noted involving the DIP joints of the fingers, IP joint of the thumb and PIP joint of the index finger. These findings are consistent with osteoarthritis. Vascular calcifications are noted along the wrist arteries. IMPRESSION: 1. No acute fracture or dislocation. 2. Nonspecific soft tissue thickening along the radial aspect of the index finger more diffuse and milder soft tissue swelling noted of the fifth finger. 3. Osteoarthritis. Electronically Signed   By: Lajean Manes M.D.   On: 07/18/2015 17:15    Assessment/Plan:  1) FUO - has persistent leukocytosis.  Has been on multiple antibiotics with no improvement.  Continued decline.  Unable to do bone marrow yet.  No identified infection, no indication for antibiotics.  Suspect non-infectious cause but work up stalled due to current status.  I will continue to follow up intermittently  Scharlene Gloss, [facility] [place] for Infectious Disease [place] [email or web address] [phone] pager   [phone] cell 07/19/2015, 3:44 PM

## 2015-07-20 ENCOUNTER — Other Ambulatory Visit: Payer: Medicare Other

## 2015-07-20 ENCOUNTER — Encounter (HOSPITAL_COMMUNITY): Payer: Self-pay | Admitting: Radiology

## 2015-07-20 ENCOUNTER — Other Ambulatory Visit: Payer: Self-pay | Admitting: Radiology

## 2015-07-20 ENCOUNTER — Ambulatory Visit (HOSPITAL_COMMUNITY): Payer: Medicare Other

## 2015-07-20 ENCOUNTER — Ambulatory Visit: Payer: Medicare Other | Admitting: Oncology

## 2015-07-20 DIAGNOSIS — D473 Essential (hemorrhagic) thrombocythemia: Secondary | ICD-10-CM

## 2015-07-20 DIAGNOSIS — N189 Chronic kidney disease, unspecified: Secondary | ICD-10-CM

## 2015-07-20 DIAGNOSIS — D638 Anemia in other chronic diseases classified elsewhere: Secondary | ICD-10-CM

## 2015-07-20 DIAGNOSIS — M609 Myositis, unspecified: Secondary | ICD-10-CM

## 2015-07-20 DIAGNOSIS — D631 Anemia in chronic kidney disease: Secondary | ICD-10-CM

## 2015-07-20 DIAGNOSIS — M791 Myalgia: Secondary | ICD-10-CM

## 2015-07-20 LAB — COMPREHENSIVE METABOLIC PANEL
ALBUMIN: 1.7 g/dL — AB (ref 3.5–5.0)
ALK PHOS: 113 U/L (ref 38–126)
ALT: 17 U/L (ref 17–63)
AST: 17 U/L (ref 15–41)
Anion gap: 8 (ref 5–15)
BILIRUBIN TOTAL: 1 mg/dL (ref 0.3–1.2)
BUN: 57 mg/dL — AB (ref 6–20)
CO2: 25 mmol/L (ref 22–32)
CREATININE: 1.45 mg/dL — AB (ref 0.61–1.24)
Calcium: 8.6 mg/dL — ABNORMAL LOW (ref 8.9–10.3)
Chloride: 108 mmol/L (ref 101–111)
GFR calc Af Amer: 48 mL/min — ABNORMAL LOW (ref >60)
GFR, EST NON AFRICAN AMERICAN: 41 mL/min — AB (ref >60)
Glucose, Bld: 108 mg/dL — ABNORMAL HIGH (ref 65–99)
POTASSIUM: 4 mmol/L (ref 3.5–5.1)
Sodium: 141 mmol/L (ref 135–145)
TOTAL PROTEIN: 5.3 g/dL — AB (ref 6.5–8.1)

## 2015-07-20 LAB — CBC WITH DIFFERENTIAL/PLATELET
BASOS PCT: 0 %
Band Neutrophils: 13 %
Basophils Absolute: 0 10*3/uL (ref 0.0–0.1)
Blasts: 0 %
EOS PCT: 0 %
Eosinophils Absolute: 0 10*3/uL (ref 0.0–0.7)
HEMATOCRIT: 23.5 % — AB (ref 39.0–52.0)
Hemoglobin: 7.3 g/dL — ABNORMAL LOW (ref 13.0–17.0)
LYMPHS ABS: 1.3 10*3/uL (ref 0.7–4.0)
LYMPHS PCT: 3 %
MCH: 29 pg (ref 26.0–34.0)
MCHC: 31.1 g/dL (ref 30.0–36.0)
MCV: 93.3 fL (ref 78.0–100.0)
MONO ABS: 1.3 10*3/uL — AB (ref 0.1–1.0)
MONOS PCT: 3 %
Metamyelocytes Relative: 3 %
Myelocytes: 3 %
NEUTROS ABS: 39.1 10*3/uL — AB (ref 1.7–7.7)
NEUTROS PCT: 75 %
NRBC: 0 /100{WBCs}
OTHER: 0 %
Platelets: 253 10*3/uL (ref 150–400)
Promyelocytes Absolute: 0 %
RBC: 2.52 MIL/uL — AB (ref 4.22–5.81)
RDW: 20.5 % — ABNORMAL HIGH (ref 11.5–15.5)
WBC: 41.7 10*3/uL — ABNORMAL HIGH (ref 4.0–10.5)

## 2015-07-20 LAB — MPO/PR-3 (ANCA) ANTIBODIES: ANCA PROTEINASE 3: 7.2 U/mL — AB (ref 0.0–3.5)

## 2015-07-20 MED ORDER — METHYLPREDNISOLONE SODIUM SUCC 125 MG IJ SOLR
60.0000 mg | Freq: Four times a day (QID) | INTRAMUSCULAR | Status: DC
  Administered 2015-07-20 – 2015-07-22 (×8): 60 mg via INTRAVENOUS
  Filled 2015-07-20 (×9): qty 2

## 2015-07-20 MED ORDER — MORPHINE SULFATE (PF) 2 MG/ML IV SOLN
2.0000 mg | Freq: Once | INTRAVENOUS | Status: AC
  Administered 2015-07-20: 2 mg via INTRAVENOUS
  Filled 2015-07-20: qty 1

## 2015-07-20 MED ORDER — MORPHINE SULFATE (PF) 2 MG/ML IV SOLN
1.0000 mg | INTRAVENOUS | Status: DC | PRN
  Administered 2015-07-20 – 2015-07-22 (×10): 1 mg via INTRAVENOUS
  Filled 2015-07-20 (×11): qty 1

## 2015-07-20 NOTE — Progress Notes (Signed)
Pt has MRI left hand scheduled unable to complete. Pt unable to hold hands above head, as required for test to be completed. Reported to day shift RN to follow up with MD.  SRP, RN

## 2015-07-20 NOTE — Progress Notes (Signed)
PROGRESS NOTE  Jack Schultz PAX:[id number] DOB: Mar 28, 1924 DOA: 07/19/2015 PCP: Jack Corwin, MD  HPI/Recap of past 24 hours:  Patient is alert, confused, seems in pain, son in  Law in room,   Assessment/Plan: Principal Problem:   Atrial fibrillation with RVR (HCC) Active Problems:   Essential thrombocytosis (HCC)   Hypothyroidism   Absolute anemia   CKD (chronic kidney disease) stage 2, GFR 60-89 ml/min   Acute on chronic diastolic HF (heart failure) (HCC)   General weakness   GERD (gastroesophageal reflux disease)   Myelodysplastic syndrome (HCC)   Protein-calorie malnutrition, severe (HCC)   Decreased movement of arm   SOB (shortness of breath)   Leukocytosis   SIRS (systemic inflammatory response syndrome) (HCC)   Sepsis (HCC)   FTT (failure to thrive) in adult   Encounter for palliative care   Elbow pain   LFT elevation   FUO (fever of unknown origin)   Myalgia and myositis   MDS (myelodysplastic syndrome) (HCC)  Fever of unknown origin,  -This is associated with upward trend in his white count, today at 38,100. - He has been treated with zosyn up until 07/19/2015 which was discontinued by Jack. Luciana Schultz of infectious disease as infectious process is unlikely at this point. He has been on multiple antimicrobial regimens without showing significant response, and has a partially shown progressive decline.  -Hematological malignancy  is on the differential having a history of myeloproliferative disorder. I spoke to Jack Schultz today who did not feel this represented leukemic transformation given lack of blasts on peripheral smear.  -Bone marrow biopsy was obtained however could not be performed due to inability of patient to cooperate.  -Case was discussed with Jack Schultz of ID. He thinks infection is unlikely. Lab work showing previous EBV infection but not consistent with acute infection. CMV labs not c/w acute infection as well. HIV negative.  -Other  possibilities on the differential would include autoimmune process. I spoke with his daughter regarding a trial of systemic steroids, risks and benefits explained. She agreed to a trial of Solu-Medrol IV.   -Blisters/violacious skin rash -I wonder about the possibility of this being related to vasculitis or other autoimmune cause which may have been precipitated by malignancy. -Skin biopsy has been considered for which surgery was consulted -As mention above, will give Jack Schultz a trial of Iv steroids as this could represent vasculitis/autoimmune process.   Myeloproliferative Disorder -few blast cells on peripheral blood. oncology consulted, planned Ct guided Bone marrow biopsy on 10/25 cancelled due to generalized pain.  -Spoke with Jack Schultz today who did not feel this represented leukemic transformation  Acute on chronic Anemia,  -likely from MDS, supportive prbc transfusion x1 10/22, last transfusion on 10/20. ( has required repeated blood transfusion since September 2016).  Afib/RVR  -with h/o heart block during hospitalization a month ago. Currently on asa 81mg  and metoprolol, not a candidate for anticoagulation due to h/o myeloproliferative/? myeolodysplastic syndrome with h/o significant epistaxis. Not a candidate for pacemaker, Heart rate better, Cardiology signed off.  Hypoxia/bilateral pleural effusion:  repeat cxr bilateral infiltrate, CTA no PE, +bialteral pleural effusion. Improved on lasix, continue lasix.  Monitor creatinine, appreicte critical care input.   Code Status: CODE STATUS discussed with patient's son and daughter, he is a DO NOT RESUSCITATE.  Family Communication: Family discussion held, we discussed prognosis, goals of care and CODE STATUS on 07/19/2015. Daughter updated on 07/20/2015 Disposition Plan:    Consultants:  Cardiology (signed off)  Oncology Jack. Benay Schultz  Infectious disease  Critical care  Orthopedics Jack.  Erlinda Schultz  Procedures:  none  Antibiotics:  Vanc/zosyn from admission to 10/23. Stopped on 07/20/2015   Objective: BP 125/57 mmHg  Pulse 84  Temp(Src) 98.3 F (36.8 C) (Axillary)  Resp 24  Ht $R'5\' 11"'FZ$  (1.803 m)  Wt 73.029 kg (161 lb)  BMI 22.46 kg/m2  SpO2 99%  Intake/Output Summary (Last 24 hours) at 07/20/15 0818 Last data filed at 07/19/15 2302  Gross per 24 hour  Intake   1160 ml  Output   1200 ml  Net    -40 ml   Filed Weights   07/19/15 0122 07/19/15 0431 07/20/15 0402  Weight: 73.256 kg (161 lb 8 oz) 72.3 kg (159 lb 6.3 oz) 73.029 kg (161 lb)    Exam:   General:  Ill appearing, nonverbal, can not communicate, he is moaning.   Oral: He has pharyngeal exudate, dry mucosa, inflammation of mucosa.   Cardiovascular: IRRR  Respiratory: diminished, no wheezing  Abdomen: Soft/ND/NT, positive BS,   Musculoskeletal: upper extremity Edema, left shoulder pain, limited range of motion, per family this is chronic  Skin: violaceous rash with few blisters  in bilateral upper extremities. More on left, blister has progressed, now larger .   Neuro: confused  Data Reviewed: Basic Metabolic Panel:  Recent Labs Lab 07/01/2015 1833  07/16/15 0350 07/17/15 0150 07/18/15 0338 07/19/15 0535 07/20/15 0535  NA 130*  < > 130* 132* 134* 137 141  K 3.9  < > 3.9 3.9 4.3 4.0 4.0  CL 95*  < > 99* 99* 100* 103 108  CO2 27  < > $R'24 24 26 27 25  'VN$ GLUCOSE 126*  < > 101* 128* 123* 110* 108*  BUN 34*  < > 31* 38* 53* 59* 57*  CREATININE 1.18  < > 0.94 1.35* 1.52* 1.64* 1.45*  CALCIUM 8.0*  < > 8.2* 8.1* 8.6* 8.6* 8.6*  MG 2.0  --   --  2.2  --   --   --   < > = values in this interval not displayed. Liver Function Tests:  Recent Labs Lab 07/16/15 0350 07/16/15 1103 07/17/15 0150 07/18/15 0338 07/19/15 0535 07/20/15 0535  AST 43*  --  $R'26 29 19 17  'dc$ ALT 46  --  32 $R'28 21 17  'oK$ ALKPHOS 176*  --  128* 135* 127* 113  BILITOT 1.9* 2.4* 1.9* 1.3* 1.1 1.0  PROT 5.1*  --  5.1* 5.2* 5.3*  5.3*  ALBUMIN 1.7*  --  1.6* 1.6* 1.7* 1.7*   No results for input(s): LIPASE, AMYLASE in the last 168 hours. No results for input(s): AMMONIA in the last 168 hours. CBC:  Recent Labs Lab 07/16/15 0350 07/16/15 1938 07/17/15 0150 07/18/15 0338 07/19/15 0535 07/20/15 0535  WBC 27.0* 31.1* 32.4* 34.1* 38.1* 41.7*  NEUTROABS 25.1* 28.3* 29.8* 25.5*  --  39.1*  HGB 9.2* 8.7* 8.5* 7.9* 7.6* 7.3*  HCT 28.5* 26.5* 26.1* 24.2* 24.1* 23.5*  MCV 91.6 91.7 90.9 92.0 93.8 93.3  PLT 296 290 292 280 238 253   Cardiac Enzymes:    Recent Labs Lab 07/14/2015 1833 07/14/15 0205 07/14/15 0843 07/16/15 0350 07/17/15 0150  CKTOTAL  --   --   --  19* 9*  TROPONINI 0.03 <0.03 <0.03  --   --    BNP (last 3 results)  Recent Labs  06/06/15 0512 07/10/2015 1833  BNP 425.4* 470.8*    ProBNP (last 3 results) No  results for input(s): PROBNP in the last 8760 hours.  CBG:  Recent Labs Lab 07/16/15 0744  GLUCAP 98    Recent Results (from the past 240 hour(s))  TECHNOLOGIST REVIEW     Status: None   Collection Time: 07/11/2015  1:57 PM  Result Value Ref Range Status   Technologist Review   Final    Few Metas and Myelocytes present, rare blast and nRBC, hypersegmented neutrophils  Culture, blood (routine x 2) Call MD if unable to obtain prior to antibiotics being given     Status: None   Collection Time: 06/27/2015  7:30 PM  Result Value Ref Range Status   Specimen Description BLOOD LEFT ARM  Final   Special Requests BOTTLES DRAWN AEROBIC AND ANAEROBIC  10CC  Final   Culture   Final    NO GROWTH 5 DAYS Performed at [facility]    Report Status 07/19/2015 FINAL  Final  Culture, blood (routine x 2) Call MD if unable to obtain prior to antibiotics being given     Status: None   Collection Time: 07/20/2015  7:52 PM  Result Value Ref Range Status   Specimen Description BLOOD RIGHT HAND  Final   Special Requests AEROBIC BOTTLE ONLY 5ML  Final   Culture   Final    NO GROWTH 5  DAYS Performed at [facility]    Report Status 07/19/2015 FINAL  Final  Urine culture     Status: None   Collection Time: 07/04/2015  9:24 PM  Result Value Ref Range Status   Specimen Description URINE, CLEAN CATCH  Final   Special Requests NONE  Final   Culture   Final    NO GROWTH 1 DAY Performed at [facility]    Report Status 07/15/2015 FINAL  Final  Culture, blood (routine x 2)     Status: None (Preliminary result)   Collection Time: 07/16/15 11:03 AM  Result Value Ref Range Status   Specimen Description BLOOD LEFT ANTECUBITAL  Final   Special Requests BOTTLES DRAWN AEROBIC AND ANAEROBIC 10CC  Final   Culture   Final    NO GROWTH 3 DAYS Performed at [facility]    Report Status PENDING  Incomplete  Culture, blood (routine x 2)     Status: None (Preliminary result)   Collection Time: 07/16/15 11:10 AM  Result Value Ref Range Status   Specimen Description BLOOD LEFT HAND  Final   Special Requests BOTTLES DRAWN AEROBIC AND ANAEROBIC 10CC  Final   Culture   Final    NO GROWTH 3 DAYS Performed at [facility]    Report Status PENDING  Incomplete     Studies: No results found.  Scheduled Meds: . sodium chloride  250 mL Intravenous Once  . antiseptic oral rinse  7 mL Mouth Rinse BID  . feeding supplement (ENSURE ENLIVE)  237 mL Oral TID BM  . ferrous sulfate  325 mg Oral Q breakfast  . levothyroxine  50 mcg Oral QAC breakfast  . lidocaine-EPINEPHrine  10 mL Intradermal Once  . methylPREDNISolone (SOLU-MEDROL) injection  60 mg Intravenous Q6H  . metoprolol  5 mg Intravenous Q8H  . neomycin-bacitracin-polymyxin   Topical BID  . sodium chloride  3 mL Intravenous Q12H    Continuous Infusions: . sodium chloride 75 mL/hr at 07/20/15 0316     Time spent: 35 min  Kelvin Cellar MD,  [facility] Pager [phone]. If 7PM-7AM, please contact night-coverage at [email or web address], password [facility]  07/20/2015, 8:18 AM  LOS: 7 days

## 2015-07-20 NOTE — Progress Notes (Signed)
IP PROGRESS NOTE  Subjective:   He is lethargic and moaning. Objective: Vital signs in last 24 hours: Blood pressure 118/58, pulse 111, temperature 99 F (37.2 C), temperature source Axillary, resp. rate 22, height _0  (1.803 m), weight 161 lb (73.029 kg), SpO2 97 %.  Intake/Output from previous day: 10/26 0701 - 10/27 0700 In: 1160 [P.O.:60; IV Piggyback:1100] Out: 1200 [Urine:1200]  Physical Exam:  Abdomen: No hepatosplenomegaly, tender with palpation diffusely  Musculoskeletal: Pain with palpation over the arms and hands, the hands are wrapped in gauze. Skin: Purpuric fluid filled lesions over the hands  Portacath/PICC-without erythema  Lab Results:  Recent Labs  07/19/15 0535 07/20/15 0535  WBC 38.1* 41.7*  HGB 7.6* 7.3*  HCT 24.1* 23.5*  PLT 238 253    BMET  Recent Labs  07/19/15 0535 07/20/15 0535  NA 137 141  K 4.0 4.0  CL 103 108  CO2 27 25  GLUCOSE 110* 108*  BUN 59* 57*  CREATININE 1.64* 1.45*  CALCIUM 8.6* 8.6*    Studies/Results: Dg Chest Port 1 View  07/19/2015  CLINICAL DATA:  Dyspnea, history of CHF, chronic renal insufficiency, and fever of unknown origin. EXAM: PORTABLE CHEST 1 VIEW COMPARISON:  CT scan of the chest and portable chest x-ray of July 16, 2015. FINDINGS: The lungs are well-expanded. The pulmonary interstitial markings remain increased but have improved slightly. The hemidiaphragms are better demonstrated. Known pleural effusions are not clearly demonstrated. The cardiac silhouette remains enlarged. The central pulmonary vascularity is mildly prominent. There is mild tortuosity of the descending thoracic aorta. There is calcification in the wall of the aortic arch. There is multilevel degenerative disc disease of the thoracic spine with a compression fracture of approximately T9. IMPRESSION: Mild interval improvement of the pulmonary interstitium may reflect ongoing resolution of interstitial edema or pneumonia. Significant  interstitial abnormalities remain. Electronically Signed   By: David  Martinique M.D.   On: 07/19/2015 07:17    Medications: I have reviewed the patient's current medications.  Assessment/Plan:  1. Essential thrombocytosis-maintained on hydroxyurea for years .   2. Leukocytosis-secondary to the myeloproliferative disorder versus a systemic infection  3. History of Pericarditis  4. Sepsis syndrome, probable pneumonia September 2016  5. Anemia secondary to the underlying myeloproliferative disorder, hydroxyurea, and potentially chronic renal failure, status post red cell transfusions  6. Chronic renal failure  7. History of recurrent epistaxis secondary to platelet dysfunction  8. Rapid atrial fibrillation  9. Gait disturbance status post recent evaluation by neurology.  10. Muscle/bone pain  11. FUO   12. Respiratory failure  13. Purpuric/bullous skin lesions over the hands  Jack Schultz appears systemically ill. He is delirious and in pain. He has developed purpuric fluid filled lesions over the hands. He most likely has a systemic infection or collagen vascular disease. I doubt the skin changes are directly related to the myeloproliferative disorder. The myeloproliferative disorder may have progressed toward a dysplastic or fibrotic phase, but I have a low clinical suspicion for leukemia. He is not a candidate for a bone marrow biopsy in his current condition. Recommendations:  1. continue supportive care measures 2. Agree with palliative care consultation 3. Morphine for pain 4. Biopsy of one of the skin lesions.   I discussed the situation with his family at the bedside.    LOS: 7 days   Sanaa Zilberman  07/20/2015, 5:26 PM

## 2015-07-20 NOTE — Progress Notes (Signed)
Bilateral hand dressings changed at 10:00am. Left hand dressing changed again at 5:30pm. Neosporin applied and wrapped in kerlex. Patient tolerated dressing change fairly. Barbee Shro[place]. Brigitte Pulse, RN

## 2015-07-20 NOTE — Progress Notes (Signed)
Nutrition Follow-up  DOCUMENTATION CODES:   Severe malnutrition in context of chronic illness  INTERVENTION:  Ensure Enlive po TID, each supplement provides 350 kcal and 20 grams of protein   NUTRITION DIAGNOSIS:   Inadequate oral intake related to poor appetite as evidenced by per patient/family report, percent weight loss.  Ongoing  GOAL:   Patient will meet greater than or equal to 90% of their needs  Not yet met, most recent PO 60%, Avg 20%  MONITOR:   PO intake, Supplement acceptance, Labs, I & O's  REASON FOR ASSESSMENT:   Consult Assessment of nutrition requirement/status  ASSESSMENT:   Jack Schultz is a 81 y.o. male with PMH significant for HTN, HLD, Atrial fibrillation, essential thrombocytosis, GERD, presumed myelodysplastic syndrome, hypothyroidism and absolute anemia; who came as direct admission from oncology office due to generalized malaise, palpitations, SOB and FTT.  Pt is still in a great deal of pain, now exhibiting fever. PO intake averaging 20% likely due to this. Most recent meal was 60%, monitor for improvement. Still receiving Ensure Enlive TID.   Diet Order:  Diet Heart Room service appropriate?: Yes; Fluid consistency:: Thin  Skin:  Reviewed, no issues  Last BM:  07/18/2015  Height:   Ht Readings from Last 1 Encounters:  07/03/2015 _0  (1.803 m)    Weight:   Wt Readings from Last 1 Encounters:  07/20/15 161 lb (73.029 kg)    Ideal Body Weight:  78.18 kg  BMI:  Body mass index is 22.46 kg/(m^2).  Estimated Nutritional Needs:   Kcal:  2200-2400 calories  Protein:  90-110 grams  Fluid:  Per doctor's reccomendation.  EDUCATION NEEDS:   No education needs identified at this time  Satira Anis. Rodolfo Gaster, MS, RD LDN After Hours/Weekend Pager [phone]

## 2015-07-20 NOTE — Progress Notes (Signed)
Some of the blisters have started to burst.  WBCs trending upward.  Needs abundant neosporin to bilateral hands and fingers and wrap with kerlix.  Patient is on high dose steroids.  I have discussed case with two other colleagues and they also feel that local wound care is indicated.  We agree that this process is more related to his MPD rather than an infectious process.    Azucena Cecil, MD [place] 5:26 PM

## 2015-07-21 DIAGNOSIS — S60529A Blister (nonthermal) of unspecified hand, initial encounter: Secondary | ICD-10-CM | POA: Insufficient documentation

## 2015-07-21 LAB — CBC
HEMATOCRIT: 27 % — AB (ref 39.0–52.0)
HEMOGLOBIN: 8.3 g/dL — AB (ref 13.0–17.0)
MCH: 30.2 pg (ref 26.0–34.0)
MCHC: 30.7 g/dL (ref 30.0–36.0)
MCV: 98.2 fL (ref 78.0–100.0)
Platelets: 296 10*3/uL (ref 150–400)
RBC: 2.75 MIL/uL — AB (ref 4.22–5.81)
RDW: 21.5 % — ABNORMAL HIGH (ref 11.5–15.5)
WBC: 59.6 10*3/uL — AB (ref 4.0–10.5)

## 2015-07-21 LAB — DIFFERENTIAL
BLASTS: 0 %
Band Neutrophils: 11 %
Basophils Absolute: 0 10*3/uL (ref 0.0–0.1)
Basophils Relative: 0 %
EOS PCT: 0 %
Eosinophils Absolute: 0 10*3/uL (ref 0.0–0.7)
LYMPHS ABS: 0.6 10*3/uL — AB (ref 0.7–4.0)
Lymphocytes Relative: 1 %
METAMYELOCYTES PCT: 4 %
MONOS PCT: 0 %
Monocytes Absolute: 0 10*3/uL — ABNORMAL LOW (ref 0.1–1.0)
Myelocytes: 5 %
NEUTROS ABS: 59 10*3/uL — AB (ref 1.7–7.7)
NRBC: 1 /100{WBCs} — AB
Neutrophils Relative %: 79 %
OTHER: 0 %
Promyelocytes Absolute: 0 %

## 2015-07-21 LAB — CULTURE, BLOOD (ROUTINE X 2)
CULTURE: NO GROWTH
Culture: NO GROWTH

## 2015-07-21 LAB — BASIC METABOLIC PANEL
ANION GAP: 8 (ref 5–15)
BUN: 71 mg/dL — ABNORMAL HIGH (ref 6–20)
CALCIUM: 8.5 mg/dL — AB (ref 8.9–10.3)
CHLORIDE: 114 mmol/L — AB (ref 101–111)
CO2: 26 mmol/L (ref 22–32)
Creatinine, Ser: 1.65 mg/dL — ABNORMAL HIGH (ref 0.61–1.24)
GFR calc non Af Amer: 35 mL/min — ABNORMAL LOW (ref >60)
GFR, EST AFRICAN AMERICAN: 41 mL/min — AB (ref >60)
Glucose, Bld: 145 mg/dL — ABNORMAL HIGH (ref 65–99)
POTASSIUM: 5 mmol/L (ref 3.5–5.1)
Sodium: 148 mmol/L — ABNORMAL HIGH (ref 135–145)

## 2015-07-21 LAB — CRYOGLOBULIN

## 2015-07-21 LAB — ANTINUCLEAR ANTIBODIES, IFA: ANTINUCLEAR ANTIBODIES, IFA: NEGATIVE

## 2015-07-21 MED ORDER — LORAZEPAM 2 MG/ML IJ SOLN
0.5000 mg | Freq: Once | INTRAMUSCULAR | Status: AC
  Administered 2015-07-21: 0.5 mg via INTRAVENOUS
  Filled 2015-07-21: qty 1

## 2015-07-21 MED ORDER — POLYVINYL ALCOHOL 1.4 % OP SOLN
2.0000 [drp] | OPHTHALMIC | Status: DC | PRN
  Administered 2015-07-21: 2 [drp] via OPHTHALMIC
  Filled 2015-07-21: qty 15

## 2015-07-21 MED ORDER — FUROSEMIDE 10 MG/ML IJ SOLN
20.0000 mg | Freq: Once | INTRAMUSCULAR | Status: AC
  Administered 2015-07-21: 20 mg via INTRAVENOUS
  Filled 2015-07-21: qty 2

## 2015-07-21 MED ORDER — VITAMINS A & D EX OINT
TOPICAL_OINTMENT | CUTANEOUS | Status: AC
  Administered 2015-07-21: 1
  Filled 2015-07-21: qty 5

## 2015-07-21 NOTE — Progress Notes (Signed)
   07/21/15 1500  Clinical Encounter Type  Visited With Family;Other (Comment) (patient non responsive)  Visit Type Follow-up  Spiritual Encounters  Spiritual Needs Emotional  Stress Factors  Family Stress Factors Lack of knowledge  [place] checked in with family' pt unresponsive; sister at bedside, wife will return on Monday; [place] will continue to offer spiritual care and support as needed. Gwynn Burly 3:12 PM

## 2015-07-21 NOTE — Care Management Important Message (Signed)
Important Message  Patient Details  Name: Jack Schultz MRN: [id number] Date of Birth: [birth year]   Medicare Important Message Given:  Yes-third notification given    Shelda Altes 07/21/2015, 3:19 [place] Message  Patient Details  Name: Jack Schultz MRN: [id number] Date of Birth: 1924/04/17   Medicare Important Message Given:  Yes-third notification given    Shelda Altes 07/21/2015, 3:19 PM

## 2015-07-21 NOTE — Progress Notes (Signed)
CSW continues to follow for dc back to [facility]. Per RN, biopsy pending- will update SNF.  Eduard Clos, MSW, [place]

## 2015-07-21 NOTE — Op Note (Signed)
Diagnosis: Bullous lesions of hands of unknown etiology.  Procedure: Incision, drainage, and biopsy of bullous lesion on right thumb  Surgeon: Zella Richer  Technique: The lesion on the right thumb was sterilely prepped with Betadine. A small puncture was made and cloudy fluid was evacuated. Part of this fluid was sent for culture. I then trimmed the thin skin off the lesion and sent this to pathology. I then did a small biopsy sharply of tissue underneath the bullous lesion and sent this to pathology with the first specimen. He tolerated this well.

## 2015-07-21 NOTE — Plan of Care (Signed)
Problem: Phase II Progression Outcomes Goal: Tolerating diet Outcome: Not Progressing No PO intake

## 2015-07-21 NOTE — Progress Notes (Addendum)
IP PROGRESS NOTE  Subjective:  He is lethargic this morning after receiving Ativan. Objective: Vital signs in last 24 hours: Blood pressure 113/53, pulse 68, temperature 99.3 F (37.4 C), temperature source Axillary, resp. rate 20, height 5\' 11"  (1.803 m), weight 157 lb 12.8 oz (71.578 kg), SpO2 100 %.  Intake/Output from previous day: 10/27 0701 - 10/28 0700 In: 1290 [P.O.:90; I.V.:1200] Out: 1300 [Urine:1300]  Physical Exam: Lungs: Clear anteriorly Cardiac: Irregular Abdomen: No hepatosplenomegaly, tender with palpation diffusely Neurologic: Lethargic, arousable, opens eyes, not following commands Musculoskeletal: Pain with palpation over the arms and hands, the hands are wrapped in gauze. Skin: Purpuric fluid filled lesions over the hands and fingers    Lab Results:  Recent Labs  07/20/15 0535 07/21/15 0458  WBC 41.7* 59.6*  HGB 7.3* 8.3*  HCT 23.5* 27.0*  PLT 253 296    BMET  Recent Labs  07/20/15 0535 07/21/15 0458  NA 141 148*  K 4.0 5.0  CL 108 114*  CO2 25 26  GLUCOSE 108* 145*  BUN 57* 71*  CREATININE 1.45* 1.65*  CALCIUM 8.6* 8.5*     Medications: I have reviewed the patient's current medications.  Assessment/Plan:  1. Essential thrombocytosis-maintained on hydroxyurea for years prior to hospital admission  2. Leukocytosis-secondary to the myeloproliferative disorder versus a systemic infection, progressive following initiation of steroids  3. History of Pericarditis  4. Sepsis syndrome, probable pneumonia September 2016  5. Anemia secondary to the underlying myeloproliferative disorder, hydroxyurea, and potentially chronic renal failure, status post red cell transfusions  6. Chronic renal failure  7. History of recurrent epistaxis secondary to platelet dysfunction  8. Rapid atrial fibrillation  9. Gait disturbance status post recent evaluation by neurology.  10. Muscle/bone pain  11. FUO   12. Respiratory failure  13.  Purpuric/bullous skin lesions over the hands-status post a biopsy and cultures 07/21/2015  Mr. Bendickson remains systemically ill. Dr. Zella Richer performed a biopsy and culture of a skin lesion this morning. He is maintained on a trial of steroids. I continue to feel it is unlikely his current presentation is primarily due to the myeloproliferative disorder.  Recommendations:  1. continue supportive care measures 2. Follow-up cultures and pathology from the skin biopsy 3. Morphine for pain 4. Increase IV fluids since he is taking nothing by mouth 5. Please call Hematology as needed. I will check on him 07/24/2015.  I discussed the case with his daughter-in-law at the bedside.    LOS: 8 days   [place]  07/21/2015, 10:59 AM

## 2015-07-21 NOTE — Progress Notes (Signed)
Received critical lab wbc-59.6. NP on call made aware, no new orders received. Pt is asleep. Will continue to monitor.

## 2015-07-21 NOTE — Progress Notes (Signed)
PROGRESS NOTE  Jack Schultz FBS:[id number] DOB: 11-Aug-1924 DOA: 07/05/2015 PCP: Nadean Corwin, MD  Assessment/Plan: Principal Problem:   Atrial fibrillation with RVR (HCC) Active Problems:   Essential thrombocytosis (HCC)   Hypothyroidism   Absolute anemia   CKD (chronic kidney disease) stage 2, GFR 60-89 ml/min   Acute on chronic diastolic HF (heart failure) (HCC)   General weakness   GERD (gastroesophageal reflux disease)   Myelodysplastic syndrome (HCC)   Protein-calorie malnutrition, severe (HCC)   Decreased movement of arm   SOB (shortness of breath)   Leukocytosis   SIRS (systemic inflammatory response syndrome) (HCC)   Sepsis (HCC)   FTT (failure to thrive) in adult   Encounter for palliative care   Elbow pain   LFT elevation   FUO (fever of unknown origin)   Myalgia and myositis   MDS (myelodysplastic syndrome) (HCC)  Fever of unknown origin,  -This is associated with upward trend in his white count, today at 38,100. - He has been treated with zosyn up until 07/19/2015 which was discontinued by Dr. Luciana Axe of infectious disease as infectious process is unlikely at this point. He has been on multiple antimicrobial regimens without showing significant response, and has a partially shown progressive decline.  -Hematological malignancy  is on the differential having a history of myeloproliferative disorder. I spoke to Dr Truett Perna today who did not feel this represented leukemic transformation given lack of blasts on peripheral smear.  -Bone marrow biopsy was obtained however could not be performed due to inability of patient to cooperate.  -Case was discussed with Dr Luciana Axe of ID. He thinks infection is unlikely. Lab work showing previous EBV infection but not consistent with acute infection. CMV labs not c/w acute infection as well. HIV negative.  -Differential would include autoimmune process such as vasculitis. I started Solu-Medrol 60 mg IV q 6 hours on  07/20/2015 after discussing risks and benefits with his daughter. Will reassess in the next 24 to 48 hours.    -Blisters/violacious skin rash -I wonder about the possibility of this being related to vasculitis or other autoimmune cause which may have been precipitated by malignancy. -Skin biopsy has been considered for which surgery was consulted -As mention above, will give [name] a trial of Iv steroids as this could represent vasculitis/autoimmune process.  -General surgery consulted for biopsy.   Myeloproliferative Disorder -few blast cells on peripheral blood. oncology consulted, planned Ct guided Bone marrow biopsy on 10/25 cancelled due to generalized pain -Spoke with Dr Truett Perna who did not feel this represented leukemic transformation  Acute on chronic Anemia,  -likely from MDS, supportive prbc transfusion x1 10/22, last transfusion on 10/20. ( has required repeated blood transfusion since September 2016).  Afib/RVR  -with h/o heart block during hospitalization a month ago. Currently on asa 81mg  and metoprolol, not a candidate for anticoagulation due to h/o myeloproliferative/? myeolodysplastic syndrome with h/o significant epistaxis. Not a candidate for pacemaker  Hypoxia/bilateral pleural effusion:  Repeat cxr bilateral infiltrate, CTA no PE, +bialteral pleural effusion.    Code Status: CODE STATUS discussed with patient's son and daughter, he is a DO NOT RESUSCITATE.  Family Communication: Family discussion held, we discussed prognosis, goals of care and CODE STATUS on 07/19/2015. Daughter updated on 07/20/2015 Disposition Plan:    Consultants:  Cardiology (signed off)  Oncology Dr. 06/25/2015  Infectious disease  Critical care  Orthopedics Dr. Truett Perna  Procedures:  none  Antibiotics:  Vanc/zosyn from admission to 10/23. Stopped on 07/20/2015  Objective: BP 113/53 mmHg  Pulse 68  Temp(Src) 99.3 F (37.4 C) (Axillary)  Resp 20  Ht $R'5\' 11"'zb$  (1.803 m)  Wt  71.578 kg (157 lb 12.8 oz)  BMI 22.02 kg/m2  SpO2 100%  Intake/Output Summary (Last 24 hours) at 07/21/15 1251 Last data filed at 07/21/15 1660  Gross per 24 hour  Intake   1290 ml  Output   1300 ml  Net    -10 ml   Filed Weights   07/19/15 0431 07/20/15 0402 07/21/15 0548  Weight: 72.3 kg (159 lb 6.3 oz) 73.029 kg (161 lb) 71.578 kg (157 lb 12.8 oz)    Exam:   General:  Ill appearing, nonverbal, can not communicate, ill appearing  Oral: He has pharyngeal exudate, dry mucosa, inflammation of mucosa.   Cardiovascular: IRRR  Respiratory: diminished, no wheezing  Abdomen: Soft/ND/NT, positive BS,   Musculoskeletal: upper extremity Edema, left shoulder pain, limited range of motion, per family this is chronic  Skin: violaceous rash associated with blood blisters involving bilateral hands.  Stable since yesterday's exam/   Neuro: He is non verbal, unable to communicate. Appears to be moving all extremities.   Data Reviewed: Basic Metabolic Panel:  Recent Labs Lab 07/17/15 0150 07/18/15 0338 07/19/15 0535 07/20/15 0535 07/21/15 0458  NA 132* 134* 137 141 148*  K 3.9 4.3 4.0 4.0 5.0  CL 99* 100* 103 108 114*  CO2 $Re'24 26 27 25 26  'YKt$ GLUCOSE 128* 123* 110* 108* 145*  BUN 38* 53* 59* 57* 71*  CREATININE 1.35* 1.52* 1.64* 1.45* 1.65*  CALCIUM 8.1* 8.6* 8.6* 8.6* 8.5*  MG 2.2  --   --   --   --    Liver Function Tests:  Recent Labs Lab 07/16/15 0350 07/16/15 1103 07/17/15 0150 07/18/15 0338 07/19/15 0535 07/20/15 0535  AST 43*  --  $R'26 29 19 17  'Vk$ ALT 46  --  32 $R'28 21 17  'ot$ ALKPHOS 176*  --  128* 135* 127* 113  BILITOT 1.9* 2.4* 1.9* 1.3* 1.1 1.0  PROT 5.1*  --  5.1* 5.2* 5.3* 5.3*  ALBUMIN 1.7*  --  1.6* 1.6* 1.7* 1.7*   No results for input(s): LIPASE, AMYLASE in the last 168 hours. No results for input(s): AMMONIA in the last 168 hours. CBC:  Recent Labs Lab 07/16/15 1938 07/17/15 0150 07/18/15 0338 07/19/15 0535 07/20/15 0535 07/21/15 0458  WBC  31.1* 32.4* 34.1* 38.1* 41.7* 59.6*  NEUTROABS 28.3* 29.8* 25.5*  --  39.1* 59.0*  HGB 8.7* 8.5* 7.9* 7.6* 7.3* 8.3*  HCT 26.5* 26.1* 24.2* 24.1* 23.5* 27.0*  MCV 91.7 90.9 92.0 93.8 93.3 98.2  PLT 290 292 280 238 253 296   Cardiac Enzymes:    Recent Labs Lab 07/16/15 0350 07/17/15 0150  CKTOTAL 19* 9*   BNP (last 3 results)  Recent Labs  06/06/15 0512 06/29/2015 1833  BNP 425.4* 470.8*    ProBNP (last 3 results) No results for input(s): PROBNP in the last 8760 hours.  CBG:  Recent Labs Lab 07/16/15 0744  GLUCAP 98    Recent Results (from the past 240 hour(s))  TECHNOLOGIST REVIEW     Status: None   Collection Time: 07/24/2015  1:57 PM  Result Value Ref Range Status   Technologist Review   Final    Few Metas and Myelocytes present, rare blast and nRBC, hypersegmented neutrophils  Culture, blood (routine x 2) Call MD if unable to obtain prior to antibiotics being given  Status: None   Collection Time: 06/27/2015  7:30 PM  Result Value Ref Range Status   Specimen Description BLOOD LEFT ARM  Final   Special Requests BOTTLES DRAWN AEROBIC AND ANAEROBIC  10CC  Final   Culture   Final    NO GROWTH 5 DAYS Performed at [facility]    Report Status 07/19/2015 FINAL  Final  Culture, blood (routine x 2) Call MD if unable to obtain prior to antibiotics being given     Status: None   Collection Time: 07/16/2015  7:52 PM  Result Value Ref Range Status   Specimen Description BLOOD RIGHT HAND  Final   Special Requests AEROBIC BOTTLE ONLY 5ML  Final   Culture   Final    NO GROWTH 5 DAYS Performed at [facility]    Report Status 07/19/2015 FINAL  Final  Urine culture     Status: None   Collection Time: 07/21/2015  9:24 PM  Result Value Ref Range Status   Specimen Description URINE, CLEAN CATCH  Final   Special Requests NONE  Final   Culture   Final    NO GROWTH 1 DAY Performed at [facility]    Report Status 07/15/2015 FINAL  Final  Culture, blood  (routine x 2)     Status: None (Preliminary result)   Collection Time: 07/16/15 11:03 AM  Result Value Ref Range Status   Specimen Description BLOOD LEFT ANTECUBITAL  Final   Special Requests BOTTLES DRAWN AEROBIC AND ANAEROBIC 10CC  Final   Culture   Final    NO GROWTH 4 DAYS Performed at [facility]    Report Status PENDING  Incomplete  Culture, blood (routine x 2)     Status: None (Preliminary result)   Collection Time: 07/16/15 11:10 AM  Result Value Ref Range Status   Specimen Description BLOOD LEFT HAND  Final   Special Requests BOTTLES DRAWN AEROBIC AND ANAEROBIC 10CC  Final   Culture   Final    NO GROWTH 4 DAYS Performed at [facility]    Report Status PENDING  Incomplete     Studies: No results found.  Scheduled Meds: . sodium chloride  250 mL Intravenous Once  . antiseptic oral rinse  7 mL Mouth Rinse BID  . feeding supplement (ENSURE ENLIVE)  237 mL Oral TID BM  . ferrous sulfate  325 mg Oral Q breakfast  . levothyroxine  50 mcg Oral QAC breakfast  . lidocaine-EPINEPHrine  10 mL Intradermal Once  . methylPREDNISolone (SOLU-MEDROL) injection  60 mg Intravenous Q6H  . metoprolol  5 mg Intravenous Q8H  . neomycin-bacitracin-polymyxin   Topical BID  . sodium chloride  3 mL Intravenous Q12H    Continuous Infusions:     Time spent: 25 min  Kelvin Cellar MD,  [facility] Pager [phone]. If 7PM-7AM, please contact night-coverage at [email or web address], password [facility] 07/21/2015, 12:51 PM  LOS: 8 days

## 2015-07-21 NOTE — Progress Notes (Signed)
  Subjective: Bullous lesions both hands, Pt with dementia, unable to answer questions.  Objective: Vital signs in last 24 hours: Temp:  [99 F (37.2 C)-100.8 F (38.2 C)] 99.3 F (37.4 C) (10/28 0649) Pulse Rate:  [111-120] 120 (10/28 0548) Resp:  [20-22] 20 (10/28 0548) BP: (118-137)/(44-58) 137/53 mmHg (10/28 0548) SpO2:  [97 %-100 %] 98 % (10/28 0548) Weight:  [71.578 kg (157 lb 12.8 oz)] 71.578 kg (157 lb 12.8 oz) (10/28 0548) Last BM Date: 07/20/15  Intake/Output from previous day: 10/27 0701 - 10/28 0700 In: 1290 [P.O.:90; I.V.:1200] Out: 1300 [Urine:1300] Intake/Output this shift:    General appearance: alert and confused, unaware of what is occuring Skin: bullous lesions both hands    Same type of lesion, but much larger now on the right.    Lab Results:   Recent Labs  07/20/15 0535 07/21/15 0458  WBC 41.7* 59.6*  HGB 7.3* 8.3*  HCT 23.5* 27.0*  PLT 253 296    BMET  Recent Labs  07/20/15 0535 07/21/15 0458  NA 141 148*  K 4.0 5.0  CL 108 114*  CO2 25 26  GLUCOSE 108* 145*  BUN 57* 71*  CREATININE 1.45* 1.65*  CALCIUM 8.6* 8.5*   PT/INR No results for input(s): LABPROT, INR in the last 72 hours.   Recent Labs Lab 07/16/15 0350 07/16/15 1103 07/17/15 0150 07/18/15 0338 07/19/15 0535 07/20/15 0535  AST 43*  --  26 29 19 17   ALT 46  --  32 28 21 17   ALKPHOS 176*  --  128* 135* 127* 113  BILITOT 1.9* 2.4* 1.9* 1.3* 1.1 1.0  PROT 5.1*  --  5.1* 5.2* 5.3* 5.3*  ALBUMIN 1.7*  --  1.6* 1.6* 1.7* 1.7*     Lipase     Component Value Date/Time   LIPASE 20* 05/31/2015 1022     Studies/Results: No results found.  Medications: . sodium chloride  250 mL Intravenous Once  . antiseptic oral rinse  7 mL Mouth Rinse BID  . feeding supplement (ENSURE ENLIVE)  237 mL Oral TID BM  . ferrous sulfate  325 mg Oral Q breakfast  . furosemide  20 mg Intravenous Once  . levothyroxine  50 mcg Oral QAC breakfast  . lidocaine-EPINEPHrine  10 mL  Intradermal Once  . methylPREDNISolone (SOLU-MEDROL) injection  60 mg Intravenous Q6H  . metoprolol  5 mg Intravenous Q8H  . neomycin-bacitracin-polymyxin   Topical BID  . sodium chloride  3 mL Intravenous Q12H    Assessment/Plan DR. Rosenbower did a biopsy and culture at bedside, no anesthesia. Biopsy and culture obtained. Myelodysplastic syndrome Essential thrombocytosis Dementia Atrial fibrillation Hypothyroid   Plan:  Will send culture, gram stain, and AFB if they can.  Tissue to pathology.  Continue hand dressings as noted by Dr. Erlinda Hong.    LOS: 8 days    Jack Schultz 07/21/2015

## 2015-07-22 ENCOUNTER — Encounter: Payer: Self-pay | Admitting: Internal Medicine

## 2015-07-22 DIAGNOSIS — Z7189 Other specified counseling: Secondary | ICD-10-CM

## 2015-07-22 LAB — CBC WITH DIFFERENTIAL/PLATELET
BAND NEUTROPHILS: 10 %
BASOS ABS: 0 10*3/uL (ref 0.0–0.1)
Basophils Relative: 0 %
EOS ABS: 0.7 10*3/uL (ref 0.0–0.7)
Eosinophils Relative: 1 %
HCT: 26.8 % — ABNORMAL LOW (ref 39.0–52.0)
HEMOGLOBIN: 8.1 g/dL — AB (ref 13.0–17.0)
LYMPHS ABS: 0.7 10*3/uL (ref 0.7–4.0)
LYMPHS PCT: 1 %
MCH: 29.3 pg (ref 26.0–34.0)
MCHC: 30.2 g/dL (ref 30.0–36.0)
MCV: 97.1 fL (ref 78.0–100.0)
MONO ABS: 0 10*3/uL — AB (ref 0.1–1.0)
MONOS PCT: 0 %
Metamyelocytes Relative: 7 %
Myelocytes: 5 %
NEUTROS PCT: 76 %
Neutro Abs: 68.3 10*3/uL — ABNORMAL HIGH (ref 1.7–7.7)
PLATELETS: 325 10*3/uL (ref 150–400)
RBC: 2.76 MIL/uL — AB (ref 4.22–5.81)
RDW: 21.4 % — ABNORMAL HIGH (ref 11.5–15.5)
WBC: 69.7 10*3/uL (ref 4.0–10.5)

## 2015-07-22 LAB — COMPREHENSIVE METABOLIC PANEL
ALBUMIN: 1.9 g/dL — AB (ref 3.5–5.0)
ALT: 16 U/L — ABNORMAL LOW (ref 17–63)
ANION GAP: 8 (ref 5–15)
AST: 14 U/L — ABNORMAL LOW (ref 15–41)
Alkaline Phosphatase: 108 U/L (ref 38–126)
BUN: 100 mg/dL — ABNORMAL HIGH (ref 6–20)
CHLORIDE: 119 mmol/L — AB (ref 101–111)
CO2: 25 mmol/L (ref 22–32)
Calcium: 8.3 mg/dL — ABNORMAL LOW (ref 8.9–10.3)
Creatinine, Ser: 2.41 mg/dL — ABNORMAL HIGH (ref 0.61–1.24)
GFR calc non Af Amer: 22 mL/min — ABNORMAL LOW (ref >60)
GFR, EST AFRICAN AMERICAN: 26 mL/min — AB (ref >60)
GLUCOSE: 175 mg/dL — AB (ref 65–99)
POTASSIUM: 5.1 mmol/L (ref 3.5–5.1)
SODIUM: 152 mmol/L — AB (ref 135–145)
Total Bilirubin: 1.1 mg/dL (ref 0.3–1.2)
Total Protein: 5.7 g/dL — ABNORMAL LOW (ref 6.5–8.1)

## 2015-07-22 MED ORDER — LORAZEPAM 2 MG/ML IJ SOLN
0.5000 mg | Freq: Once | INTRAMUSCULAR | Status: AC
  Administered 2015-07-22: 0.5 mg via INTRAVENOUS
  Filled 2015-07-22: qty 1

## 2015-07-24 LAB — WOUND CULTURE: Culture: NO GROWTH

## 2015-07-25 NOTE — Progress Notes (Signed)
Events of last 24 hours noted.  The patient is comfort care and I doubt will survive the day.  His family is at the bedside.  Will sign off.  Alphonsa Overall, MD, [facility] Pager: [phone] Office phone:  [phone]

## 2015-07-25 NOTE — Discharge Summary (Signed)
Death Summary  [name]:[id number] DOB: 1924/02/05 DOA: 08-10-15  PCP: [name], MD PCP/Office notified:   Admit date: August 10, 2015 Date of Death: 08/19/15  Final Diagnoses:  Principal Problem:   Atrial fibrillation with RVR ([place]) Active Problems:   Essential thrombocytosis (HCC)   Hypothyroidism   Absolute anemia   CKD (chronic kidney disease) stage 2, GFR 60-89 ml/min   Acute on chronic diastolic HF (heart failure) (HCC)   General weakness   GERD (gastroesophageal reflux disease)   Myelodysplastic syndrome (HCC)   Protein-calorie malnutrition, severe (HCC)   Decreased movement of arm   SOB (shortness of breath)   Leukocytosis   SIRS (systemic inflammatory response syndrome) (HCC)   Sepsis (HCC)   FTT (failure to thrive) in adult   Encounter for palliative care   Elbow pain   LFT elevation   FUO (fever of unknown origin)   Myalgia and myositis   MDS (myelodysplastic syndrome) (HCC)   Hand blister    History of present illness:  Jack Schultz is a 81 y.o. male with PMH significant for HTN, HLD, Atrial fibrillation, essential thrombocytosis, GERD, presumed myelodysplastic syndrome, hypothyroidism and absolute anemia; who came as direct admission from oncology office due to generalized malaise, palpitations, SOB and FTT. Patient with recent admission due to PNA and atrial fibrillation. Was discharged to SNF and was initially doing ok; then his blood counts started to have drops on his Hgb and required 2 units of PRBC's. On the day of admission found to have elevated WBC's (up to 30,000), was on afib with RVR and Hgb of 7.9. Patient SOB and feeling very weak. [facility] has been called to admit patient for further evaluation and treatment. Of note, patient endorses anorexia and left elbow pain, swelling and decrease range of motion; he denies CP, abd pain, dysuria, melena, hematochezia, hemoptysis, HA's, focal neurologic weakness, nausea, vomiting or any  other complaints.  Hospital Course:  [name] is was unfortunate 81 year old gentleman with a past medical history of myelodysplastic syndrome who was admitted to the medicine service on 08-10-2015 when he presented with complaints of generalized weakness, malaise, intermittent fevers, chills, shortness of breath. Family members describing a progressive decline over the past month. He was found to be in atrial fibrillation with rapid ventricular response which initially was thought to have been precipitated by an underlying infectious process. He was administered IV Lopressor and started on IV fluids along with broad-spectrum IV antimicrobial therapy with vancomycin and Zosyn for potential healthcare associated pneumonia. Initial lab work revealed a white count of 29,600 with hemoglobin of 7.9. Initial chest x-ray revealed bibasilar effusions and atelectasis. Patient did not show improvement as he continued to spike temperatures. He was seen and evaluated by Dr. Lucianne Lei dam of infectious disease on 07/16/2015 who felt that this represented fever of unknown origin. He ordered CMV, EBV, hepatitis serologies, HIV, ESR, CRP, rheumatoid factor, ANA. His respiratory status worsened on 07/17/2015 for which pulmonary critical care was consulted. He was diuresed with IV Lasix as it was felt respiratory symptoms were secondary to acute CHF/pulmonary vascular congestion. During this time he developed blood blisters involving bilateral hands. It was unclear what these represented with possibilities including vasculitis, drug reaction, autoimmune phenomenon. Having a history of myelodysplastic syndrome there was the question of this representing leukemic transformation. He was seen by his primary oncologist Dr. Malachy Mood during this hospitalization who felt this possibility was less likely. A bone marrow biopsy however, was attempted on 2 occasions during this  hospitalization however could not be done given patient intolerance  to procedure. He continued to decline over the course of his hospitalization. Goals of care were discussed with his daughter and son. They verbalize patient's wishes to not undergo cardiopulmonary resuscitation in the event of a code. He was made a DO NOT RESUSCITATE. He was started on IV steroids which did not seem to change course. On 2015-08-11 he had a significant decline becoming nonverbal, having absent plantar and ocular reflexes. I called family members expressed my concerns that he was in the process of dying. Family members came immediately to see him. We discussed goals of care as he was transitioned to comfort measures.  He passed away at 12:32 PM on 11-Aug-2015. Family members were at bedside.  Time: 12:32 PM  Signed:  Kelvin Cellar  [facility] 08/11/2015, 4:18 PM

## 2015-07-25 NOTE — Progress Notes (Signed)
RN was called by family member to check the patient. Went in the room patient not breathing, no palpable pulse, CN came in to check patient. MD paged, saw patient and  pronounced dead 12:32pm.  [facility] was called Ref # [id number] patient not suitable for organ donor. Post Mortem care done. Bed placement notified.

## 2015-07-25 NOTE — Progress Notes (Signed)
PROGRESS NOTE  Jack Schultz QBH:[id number] DOB: 05-Dec-1923 DOA: 06/24/2015 PCP: [name], MD  Assessment/Plan: Principal Problem:   Atrial fibrillation with RVR ([place]) Active Problems:   Essential thrombocytosis (HCC)   Hypothyroidism   Absolute anemia   CKD (chronic kidney disease) stage 2, GFR 60-89 ml/min   Acute on chronic diastolic HF (heart failure) (HCC)   General weakness   GERD (gastroesophageal reflux disease)   Myelodysplastic syndrome (HCC)   Protein-calorie malnutrition, severe (HCC)   Decreased movement of arm   SOB (shortness of breath)   Leukocytosis   SIRS (systemic inflammatory response syndrome) (HCC)   Sepsis (HCC)   FTT (failure to thrive) in adult   Encounter for palliative care   Elbow pain   LFT elevation   FUO (fever of unknown origin)   Myalgia and myositis   MDS (myelodysplastic syndrome) (HCC)   Hand blister  Fever of unknown origin,  -This is associated with upward trend in his white count, today at 38,100. - He has been treated with zosyn up until 07/19/2015 which was discontinued by Dr. Linus Salmons of infectious disease as infectious process is unlikely at this point. He has been on multiple antimicrobial regimens without showing significant response, and has a partially shown progressive decline.  -Hematological malignancy  is on the differential having a history of myeloproliferative disorder. I spoke to Dr Benay Spice today who did not feel this represented leukemic transformation given lack of blasts on peripheral smear.  -Bone marrow biopsy was obtained however could not be performed due to inability of patient to cooperate.  -Case was discussed with Dr Linus Salmons of ID. He thinks infection is unlikely. Lab work showing previous EBV infection but not consistent with acute infection. CMV labs not c/w acute infection as well. HIV negative.  -Differential would include autoimmune process such as vasculitis. I started Solu-Medrol 60 mg IV q 6  hours on 07/20/2015 after discussing risks and benefits with his daughter.  -Patient has shown a significant deterioration on this morning's evaluation. He has become unresponsive, absent plantar reflexes, no response to pain stimuli.  -Labs showing worsening renal function, O2 sats dropping and requiring a non-rebreather.  -Explain to family members that he is likely dying.   -Blisters/violacious skin rash -I wonder about the possibility of this being related to vasculitis or other autoimmune cause which may have been precipitated by malignancy. -Skin biopsy has been considered for which surgery was consulted -He received 48 hours of steroids.    Myeloproliferative Disorder -few blast cells on peripheral blood. oncology consulted, planned Ct guided Bone marrow biopsy on 10/25 cancelled due to generalized pain -Spoke with Dr Benay Spice who did not feel this represented leukemic transformation  Goals of Care I spoke to his son and daughter regarding clinical deterioration and the medical goals of care. I explained that Mr Hansley was unresponsive, having no response to painful stimuli, with absent plantar and ocular reflexes. I believe is likely in the process of dying. Family members wishing to focus his care on comfort. I stopped IV steroids as well as oral medications on his MAR. Will provide IV Morphine and Ativan as needed for signs of distress or suffering. I think he may pass within the next 24-48 hours.       Code Status: CODE STATUS discussed with patient's son and daughter, he is a DO NOT RESUSCITATE.  Family Communication: Family discussion held to discuss comfort care Disposition Plan:    Consultants:  Cardiology (signed off)  Oncology  Dr. Benay Spice  Infectious disease  Critical care  Orthopedics Dr. Erlinda Hong  Procedures:  none  Antibiotics:  Vanc/zosyn from admission to 10/23. Stopped on 07/20/2015   Objective: BP 121/53 mmHg  Pulse 98  Temp(Src) 99.4 F (37.4 C)  (Axillary)  Resp 25  Ht 5' 11" (1.803 m)  Wt 73 kg (160 lb 15 oz)  BMI 22.46 kg/m2  SpO2 90%  Intake/Output Summary (Last 24 hours) at 2015-07-27 1022 Last data filed at Jul 27, 2015 0655  Gross per 24 hour  Intake      0 ml  Output    400 ml  Net   -400 ml   Filed Weights   07/20/15 0402 07/21/15 0548 July 27, 2015 0443  Weight: 73.029 kg (161 lb) 71.578 kg (157 lb 12.8 oz) 73 kg (160 lb 15 oz)    Exam:   General:  Unresponsive  Oral: He has pharyngeal exudate, dry mucosa, inflammation of mucosa.   Cardiovascular: Irregular rate and rhythm   Respiratory: Bilateral rales, course respiratory sounds.   Abdomen: Soft/ND/NT, positive BS,   Musculoskeletal: upper extremity Edema, left shoulder pain, limited range of motion, per family this is chronic  Skin: violaceous rash associated with blood blisters involving bilateral hands.  Stable since yesterday's exam/  Neuro: He has become unresponsive, with absent plantar and ocular reflexes. No response to painful stimuli  Data Reviewed: Basic Metabolic Panel:  Recent Labs Lab 07/17/15 0150 07/18/15 0338 07/19/15 0535 07/20/15 0535 07/21/15 0458 07/27/15 0523  NA 132* 134* 137 141 148* 152*  K 3.9 4.3 4.0 4.0 5.0 5.1  CL 99* 100* 103 108 114* 119*  CO2 _0 GLUCOSE 128* 123* 110* 108* 145* 175*  BUN 38* 53* 59* 57* 71* 100*  CREATININE 1.35* 1.52* 1.64* 1.45* 1.65* 2.41*  CALCIUM 8.1* 8.6* 8.6* 8.6* 8.5* 8.3*  MG 2.2  --   --   --   --   --    Liver Function Tests:  Recent Labs Lab 07/17/15 0150 07/18/15 0338 07/19/15 0535 07/20/15 0535 2015/07/27 0523  AST _1 14*  ALT 32 _2 16*  ALKPHOS 128* 135* 127* 113 108  BILITOT 1.9* 1.3* 1.1 1.0 1.1  PROT 5.1* 5.2* 5.3* 5.3* 5.7*  ALBUMIN 1.6* 1.6* 1.7* 1.7* 1.9*   No results for input(s): LIPASE, AMYLASE in the last 168 hours. No results for input(s): AMMONIA in the last 168 hours. CBC:  Recent Labs Lab 07/17/15 0150 07/18/15 0338  07/19/15 0535 07/20/15 0535 07/21/15 0458 2015/07/27 0523  WBC 32.4* 34.1* 38.1* 41.7* 59.6* 69.7*  NEUTROABS 29.8* 25.5*  --  39.1* 59.0* 68.3*  HGB 8.5* 7.9* 7.6* 7.3* 8.3* 8.1*  HCT 26.1* 24.2* 24.1* 23.5* 27.0* 26.8*  MCV 90.9 92.0 93.8 93.3 98.2 97.1  PLT 292 280 238 253 296 325   Cardiac Enzymes:    Recent Labs Lab 07/16/15 0350 07/17/15 0150  CKTOTAL 19* 9*   BNP (last 3 results)  Recent Labs  06/06/15 0512 06/28/2015 1833  BNP 425.4* 470.8*    ProBNP (last 3 results) No results for input(s): PROBNP in the last 8760 hours.  CBG:  Recent Labs Lab 07/16/15 0744  GLUCAP 98    Recent Results (from the past 240 hour(s))  TECHNOLOGIST REVIEW     Status: None   Collection Time: 06/29/2015  1:57 PM  Result Value Ref Range Status   Technologist Review   Final    Few Metas and Myelocytes present,  rare blast and nRBC, hypersegmented neutrophils  Culture, blood (routine x 2) Call MD if unable to obtain prior to antibiotics being given     Status: None   Collection Time: 07/24/2015  7:30 PM  Result Value Ref Range Status   Specimen Description BLOOD LEFT ARM  Final   Special Requests BOTTLES DRAWN AEROBIC AND ANAEROBIC  10CC  Final   Culture   Final    NO GROWTH 5 DAYS Performed at [facility]    Report Status 07/19/2015 FINAL  Final  Culture, blood (routine x 2) Call MD if unable to obtain prior to antibiotics being given     Status: None   Collection Time: 07/02/2015  7:52 PM  Result Value Ref Range Status   Specimen Description BLOOD RIGHT HAND  Final   Special Requests AEROBIC BOTTLE ONLY 5ML  Final   Culture   Final    NO GROWTH 5 DAYS Performed at [facility]    Report Status 07/19/2015 FINAL  Final  Urine culture     Status: None   Collection Time: 07/04/2015  9:24 PM  Result Value Ref Range Status   Specimen Description URINE, CLEAN CATCH  Final   Special Requests NONE  Final   Culture   Final    NO GROWTH 1 DAY Performed at [facility]    Report Status 07/15/2015 FINAL  Final  Culture, blood (routine x 2)     Status: None   Collection Time: 07/16/15 11:03 AM  Result Value Ref Range Status   Specimen Description BLOOD LEFT ANTECUBITAL  Final   Special Requests BOTTLES DRAWN AEROBIC AND ANAEROBIC 10CC  Final   Culture   Final    NO GROWTH 5 DAYS Performed at [facility]    Report Status 07/21/2015 FINAL  Final  Culture, blood (routine x 2)     Status: None   Collection Time: 07/16/15 11:10 AM  Result Value Ref Range Status   Specimen Description BLOOD LEFT HAND  Final   Special Requests BOTTLES DRAWN AEROBIC AND ANAEROBIC 10CC  Final   Culture   Final    NO GROWTH 5 DAYS Performed at [facility]    Report Status 07/21/2015 FINAL  Final     Studies: No results found.  Scheduled Meds: . sodium chloride  250 mL Intravenous Once  . antiseptic oral rinse  7 mL Mouth Rinse BID  . lidocaine-EPINEPHrine  10 mL Intradermal Once  . neomycin-bacitracin-polymyxin   Topical BID  . sodium chloride  3 mL Intravenous Q12H    Continuous Infusions:     Time spent: 35 min,  30 min spent in face to face discussion with family members to discuss goals of care and transition to comfort.   Kelvin Cellar MD,  [facility] Pager [phone]. If 7PM-7AM, please contact night-coverage at [email or web address], password [facility] Aug 14, 2015, 10:22 AM  LOS: 9 days

## 2015-07-25 NOTE — Progress Notes (Signed)
2015/08/16 1030  Clinical Encounter Type  Visited With Family;Other (patient non responsive)  Visit Type Follow-up  Spiritual Encounters  Spiritual Needs Emotional  Stress Factors  Family Stress Factors Denial       Family members by bedside relate that they are fine and that Mr Dineen is fine. Chaplain whispered in Mr Schutter ear a prayer for peace.

## 2015-07-25 NOTE — Progress Notes (Signed)
Patient temp 101.2, restless and moaning, PRN tylenol given and Kathline Magic notified. Will continue to assess patient.

## 2015-07-25 NOTE — Progress Notes (Signed)
Patients body was picked up by Storm Frisk of [place].

## 2015-07-25 DEATH — deceased

## 2015-09-02 LAB — AFB CULTURE WITH SMEAR (NOT AT ARMC): ACID FAST SMEAR: NONE SEEN

## 2015-10-02 ENCOUNTER — Ambulatory Visit: Payer: Medicare Other | Admitting: Neurology

## 2016-01-10 ENCOUNTER — Encounter: Payer: Self-pay | Admitting: Internal Medicine

## 2016-05-05 IMAGING — CR DG CHEST 2V
1 series · 1 of 1 positions shown · non-contrast
Comparison: 06/06/2015, 05/31/2015.

CLINICAL DATA: Cough and shortness of breath.  Acute kidney injury.

EXAM:
CHEST  2 VIEW

[w chest lat]
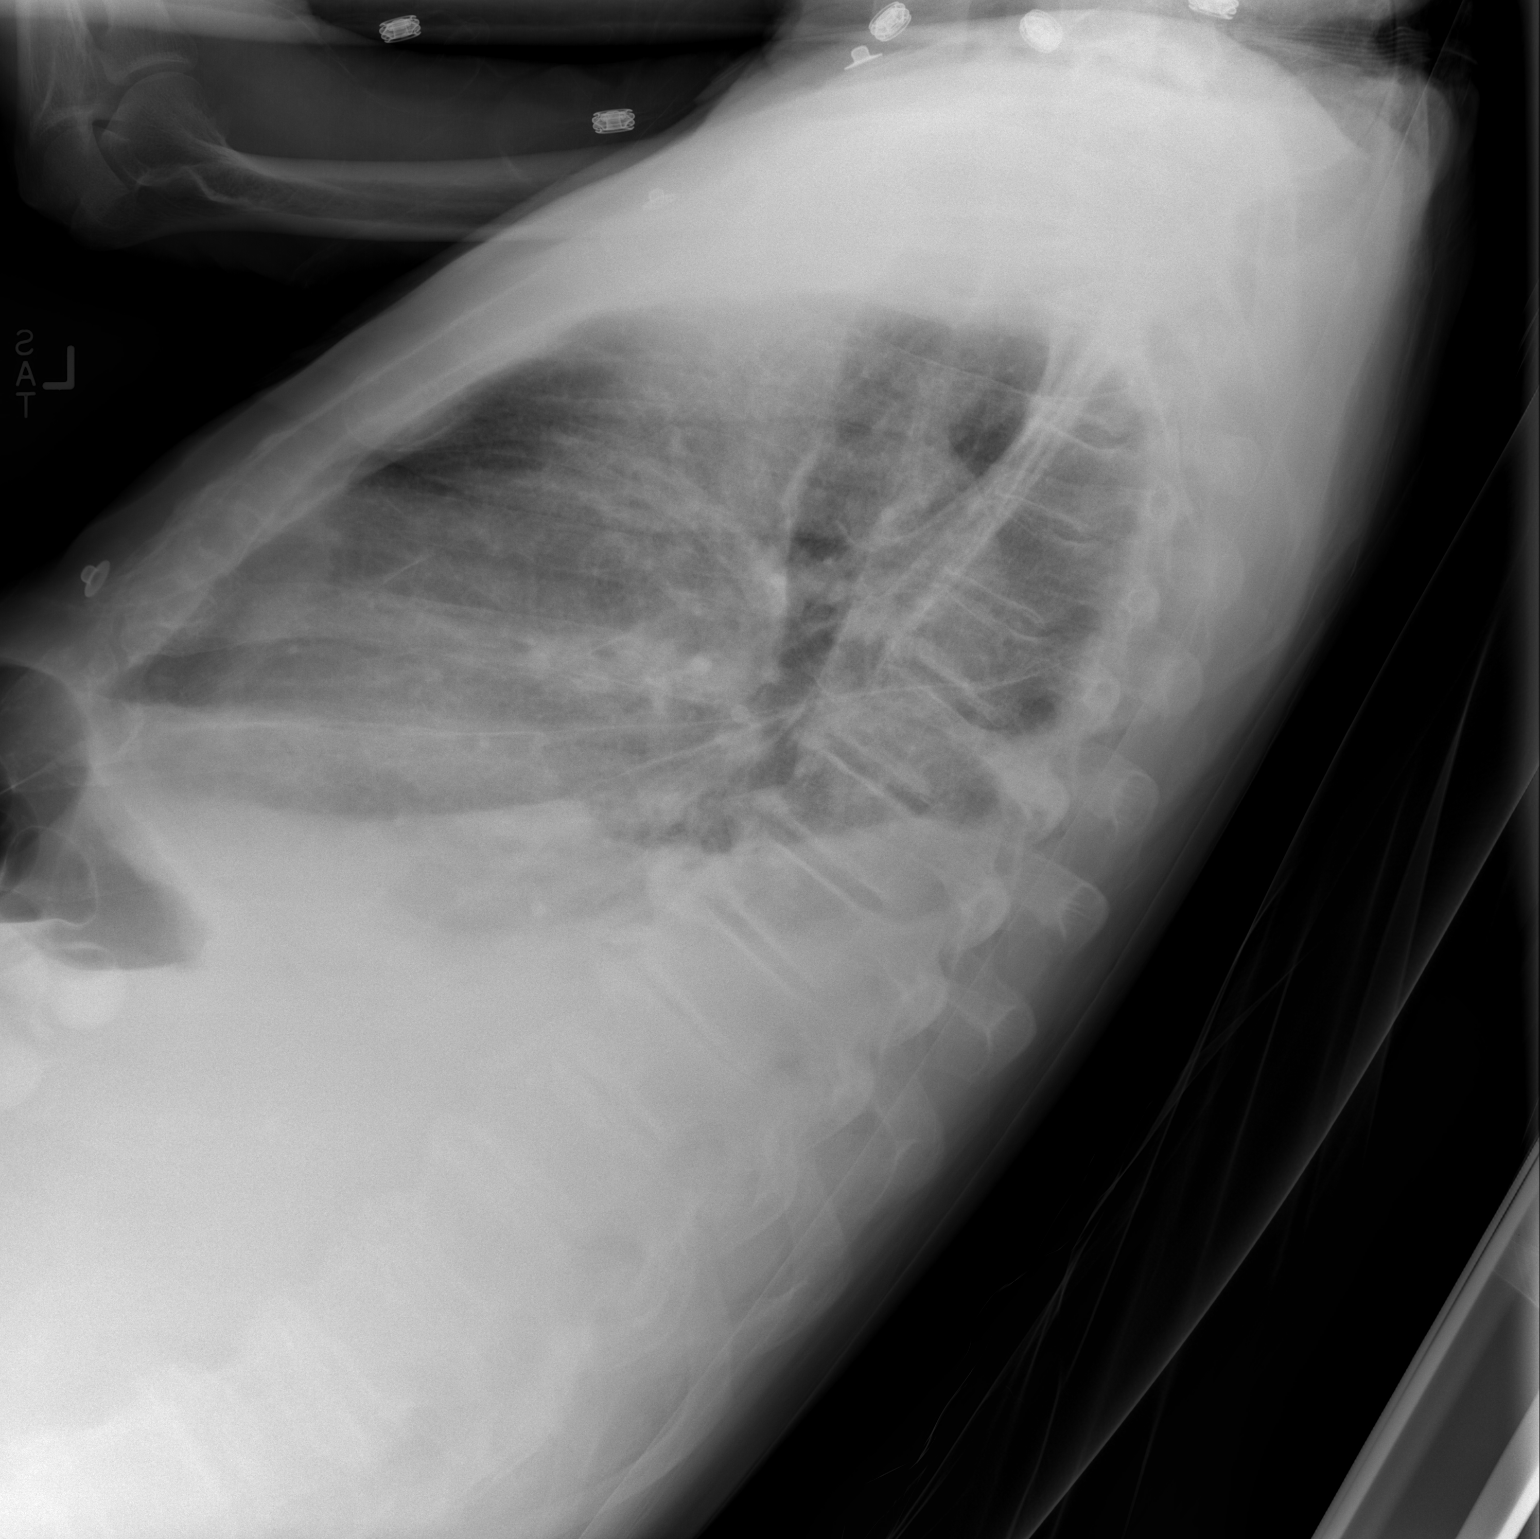

[1 of 1 positions shown; findings below may reference images not displayed]

FINDINGS: Cardiac silhouette markedly enlarged, unchanged. Interval worsening
of interstitial and perihilar airspace pulmonary edema. Large
bilateral pleural effusions, increased in size. Dense consolidation
in the lower lobes, worse than on the prior examinations.
Degenerative changes throughout the thoracic and visualized upper
lumbar spine. Severe degenerative changes in the shoulders.
IMPRESSION: 1. Worsening interstitial and airspace pulmonary edema since the
examination 3 days ago.
2. Large bilateral pleural effusions, increased in size since the
examination 3 days ago.
3. Dense passive atelectasis in the lower lobes (favored over
pneumonia) with worsening aeration.

## 2016-05-07 IMAGING — CR DG CHEST 2V
1 series · 1 of 1 positions shown · non-contrast
Comparison: 06/09/2015

CLINICAL DATA: Pneumonia, CHF and hypertension.

EXAM:
CHEST  2 VIEW

[w chest lat]
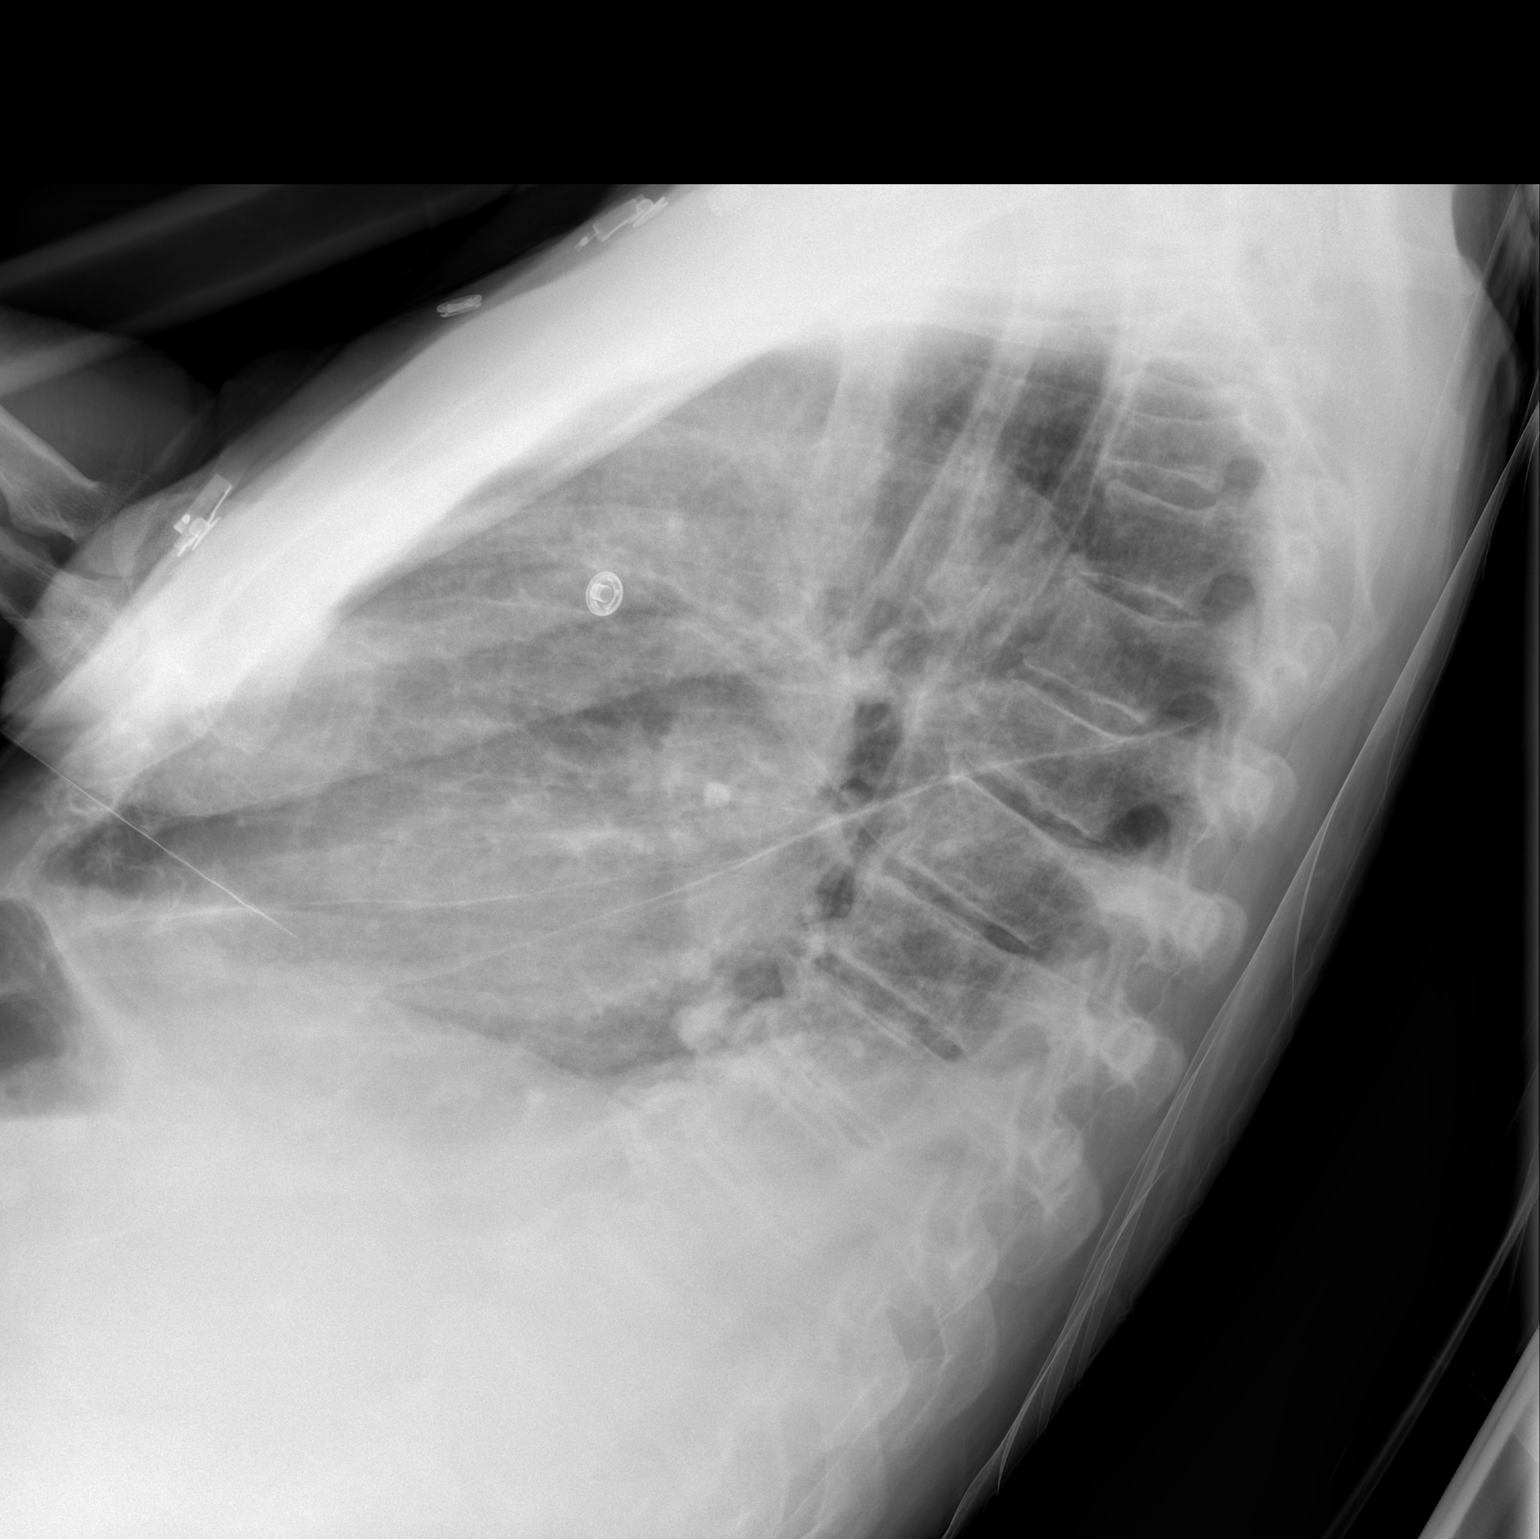

[1 of 1 positions shown; findings below may reference images not displayed]

FINDINGS: The heart is enlarged but stable. There is tortuosity and
calcification of the thoracic aorta. The lung bases show improved
aeration. There are persistent small effusions. The bony thorax is
stable.
IMPRESSION: Improving lung aeration with resolving edema, atelectasis and
effusions.

## 2016-06-08 IMAGING — DX DG CHEST 2V
2 series · 2 of 2 positions shown · non-contrast
Comparison: 06/11/2015

CLINICAL DATA: Weakness, shortness of breath, hypertension, former
smoker

EXAM:
CHEST  2 VIEW

[chest lat]
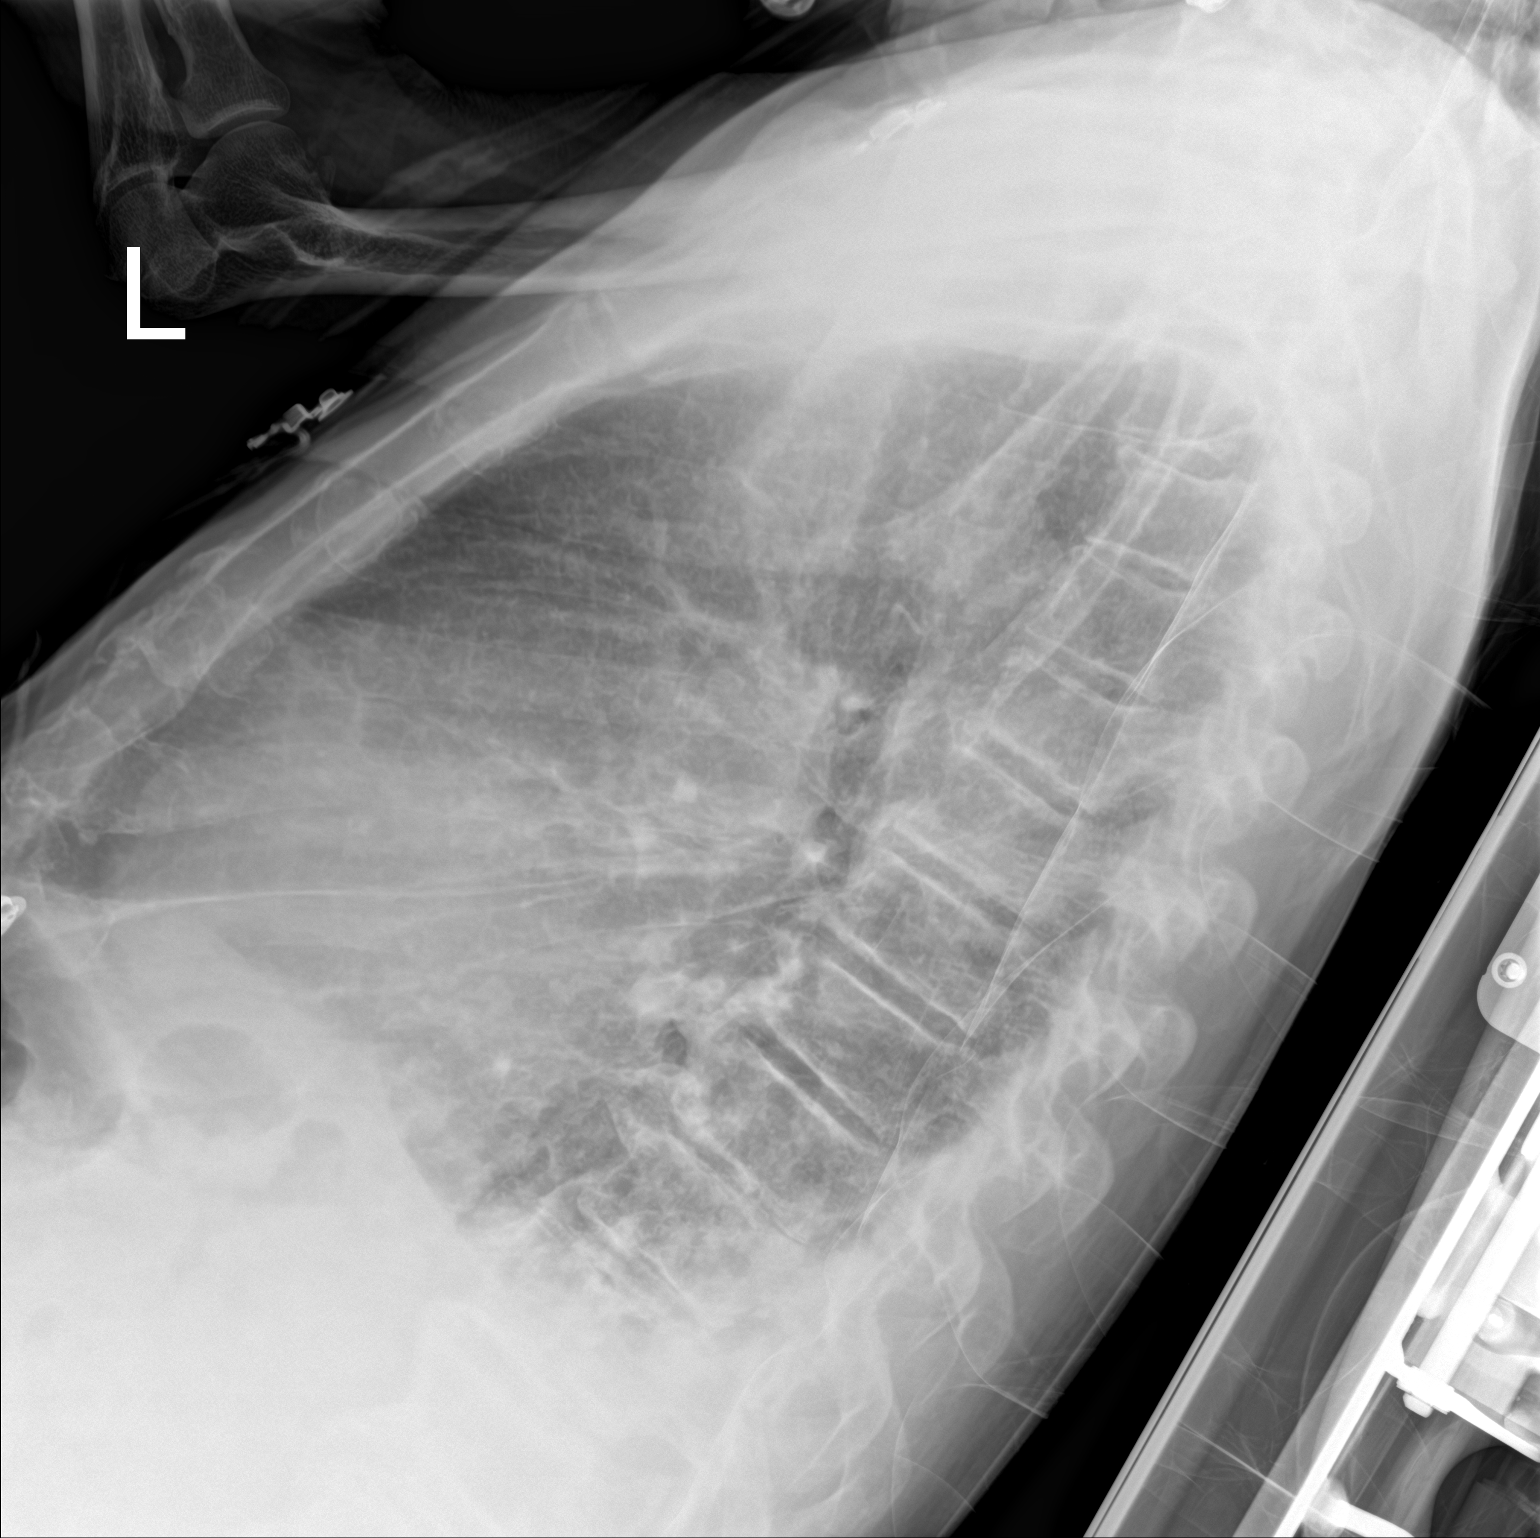

[chest ap]
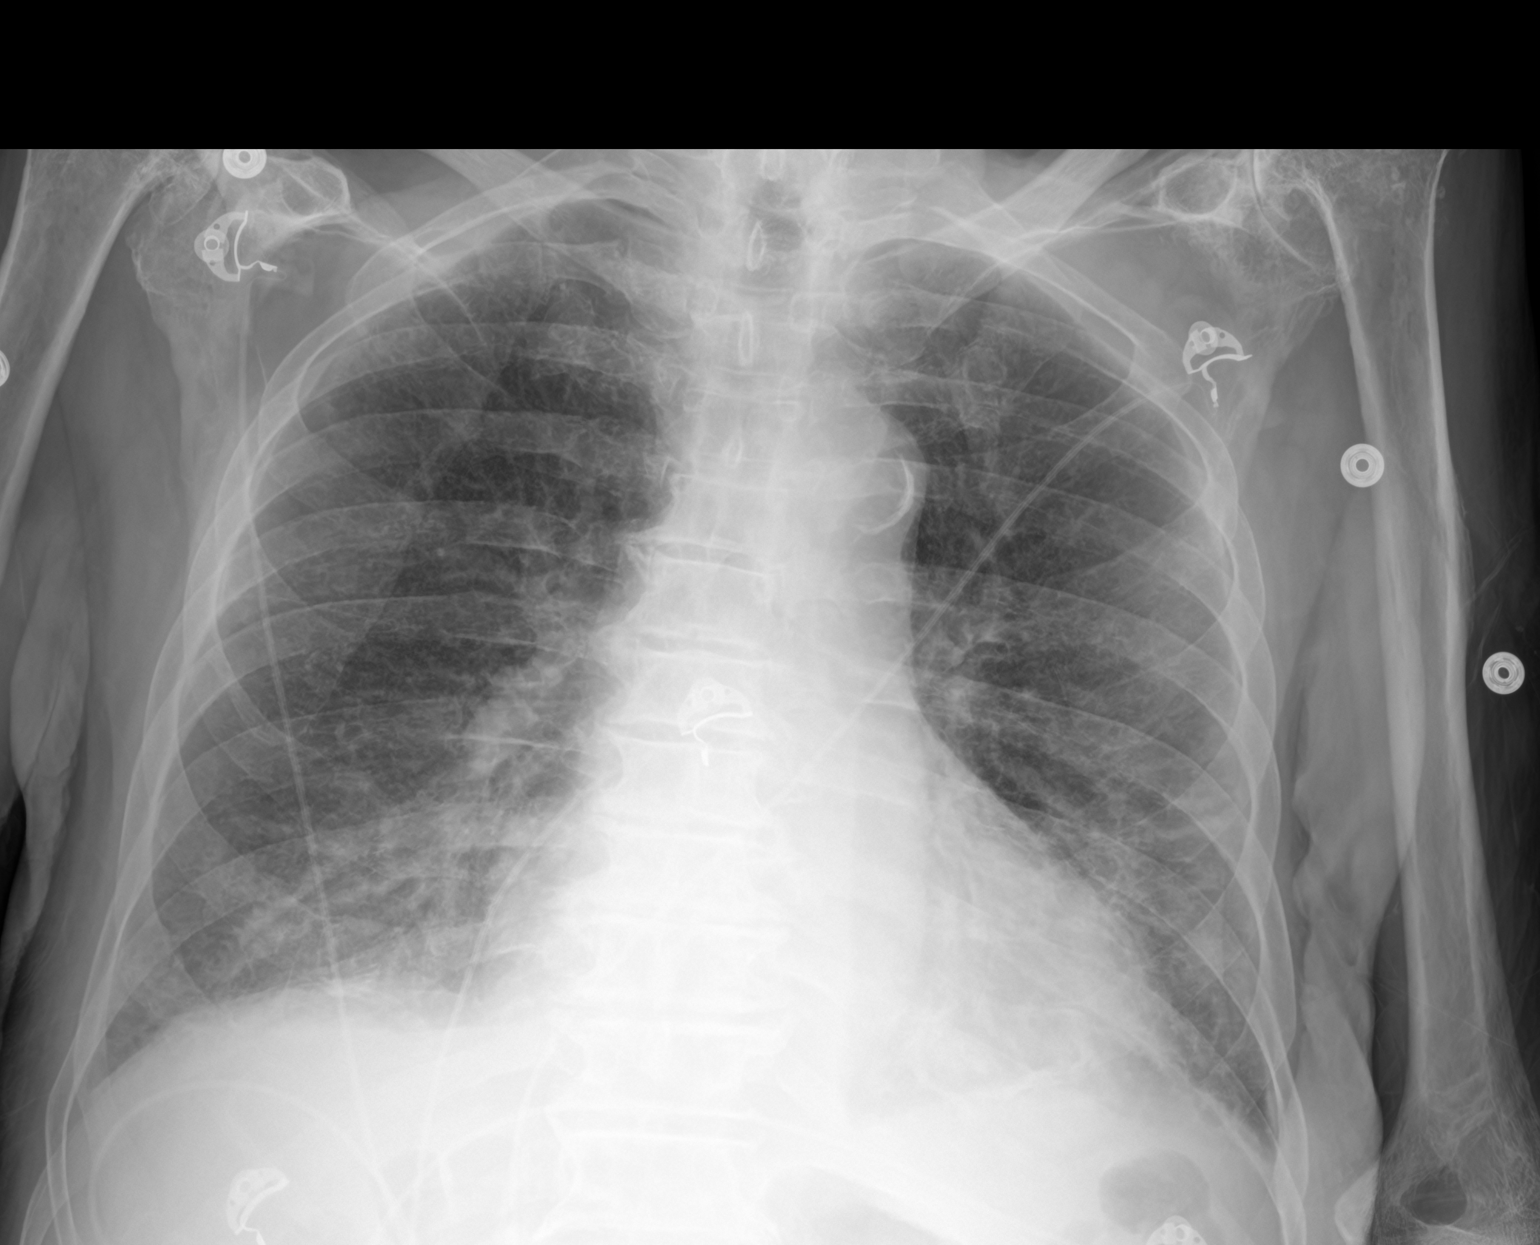

[2 of 2 positions shown; findings below may reference images not displayed]

FINDINGS: Enlargement of cardiac silhouette.

Atherosclerotic calcification mild tortuosity of thoracic aorta.

Mediastinal contours and pulmonary vascularity otherwise normal.

Bibasilar infiltrates, improved on LEFT.

Decreased basilar effusions.

Underlying emphysematous changes.

Upper lungs clear.

No pneumothorax.

Advanced BILATERAL glenohumeral degenerative changes and chronic
rotator cuff tears.

Osseous demineralization.
IMPRESSION: Emphysematous changes with bibasilar infiltrates, improved on LEFT.

Decreased bibasilar effusions and atelectasis

## 2016-10-10 IMAGING — US US ABDOMEN LIMITED
1 series · 14 of 25 positions shown · non-contrast
Comparison: CT the abdomen 06/08/2015. Abdominal ultrasound
05/31/2015.

CLINICAL DATA: Elevated LFTs.

EXAM:
US ABDOMEN LIMITED - RIGHT UPPER QUADRANT

[Series 1: us abdomen limited · 0.21mm/px · 14 of 40 slices shown]
[im 1/40]
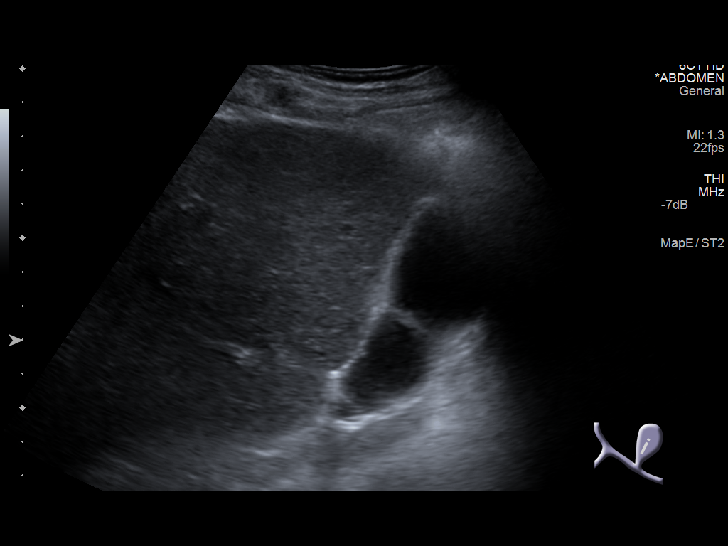
[im 4/40]
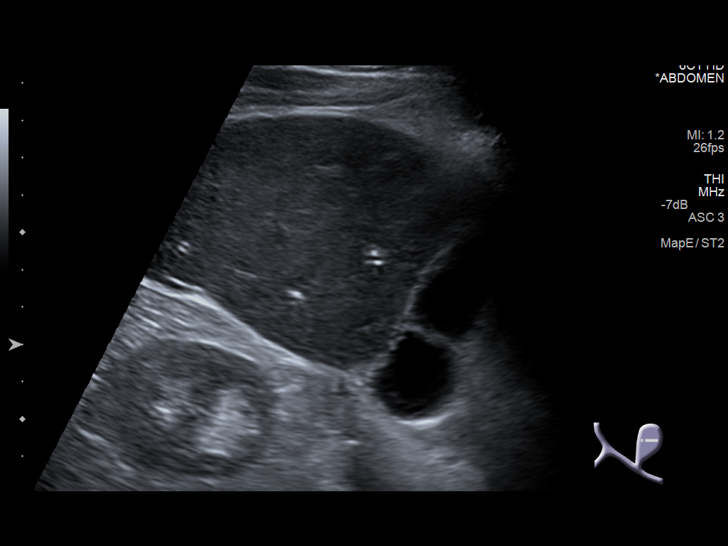
[im 7/40]
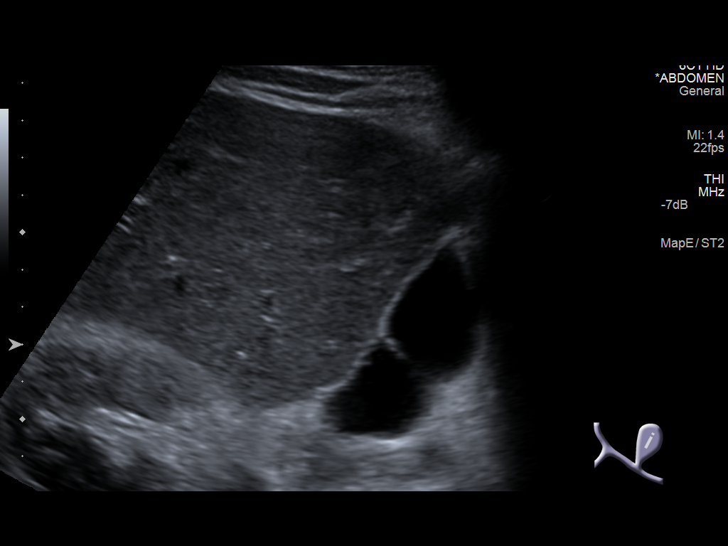
[im 10/40]
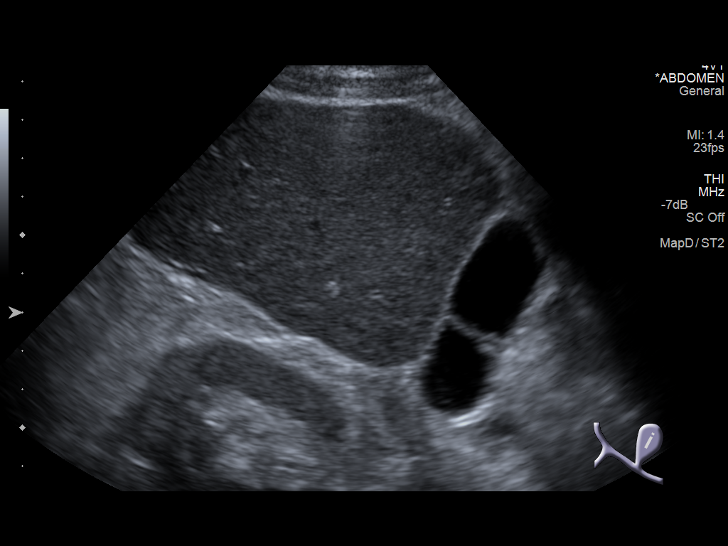
[im 14/40]
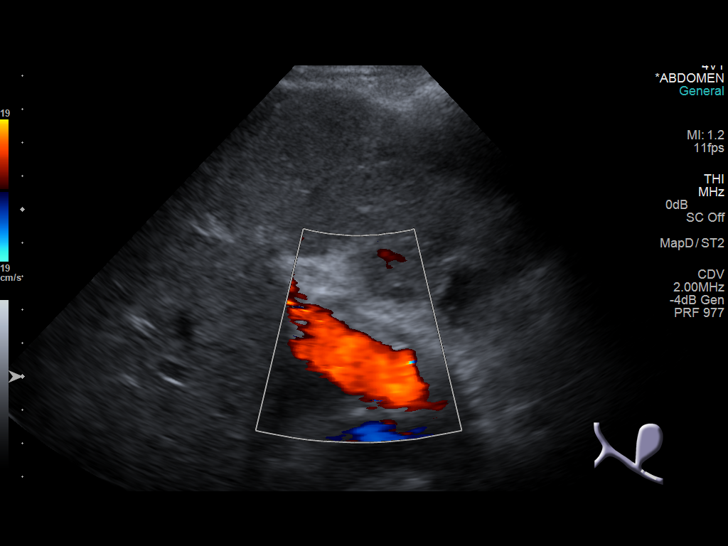
[im 15/40]
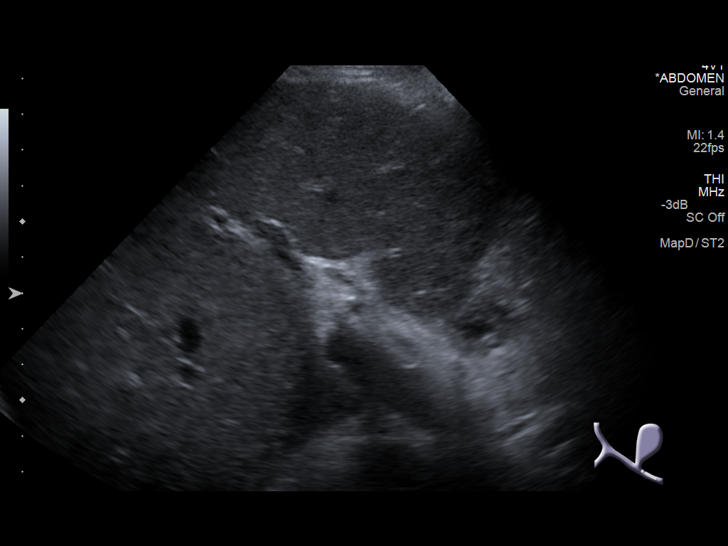
[im 18/40]
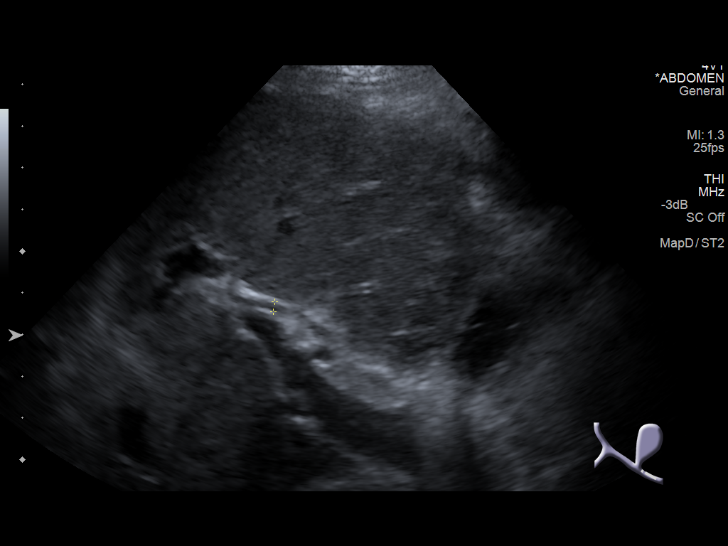
[im 22/40]
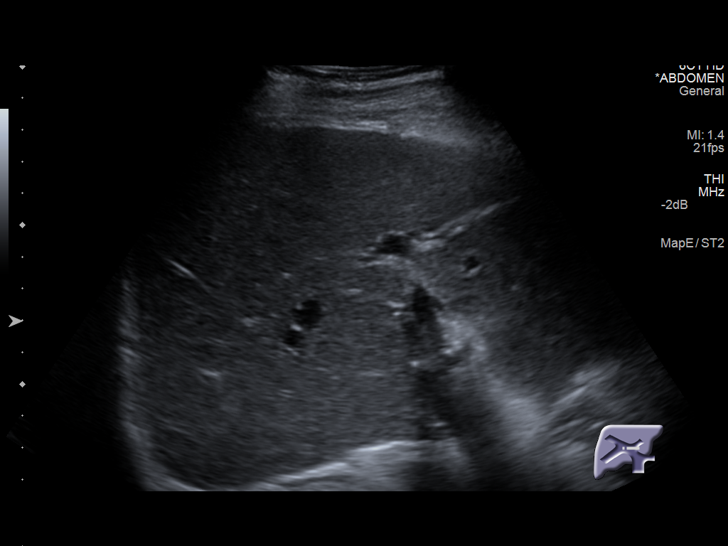
[im 25/40]
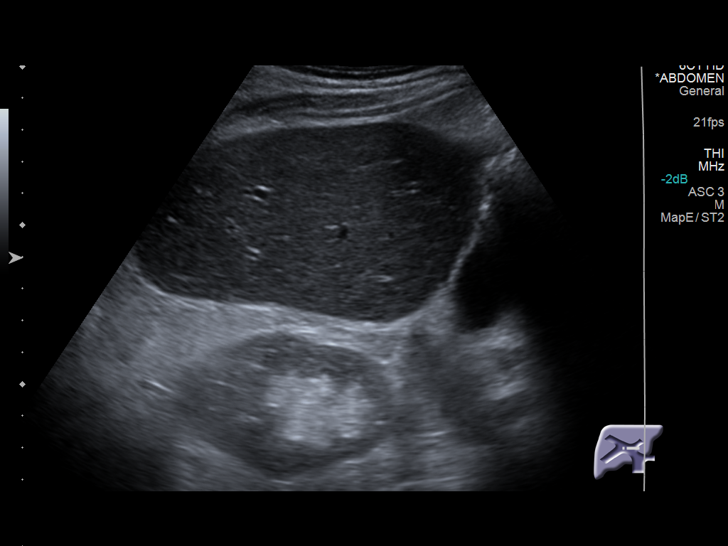
[im 27/40]
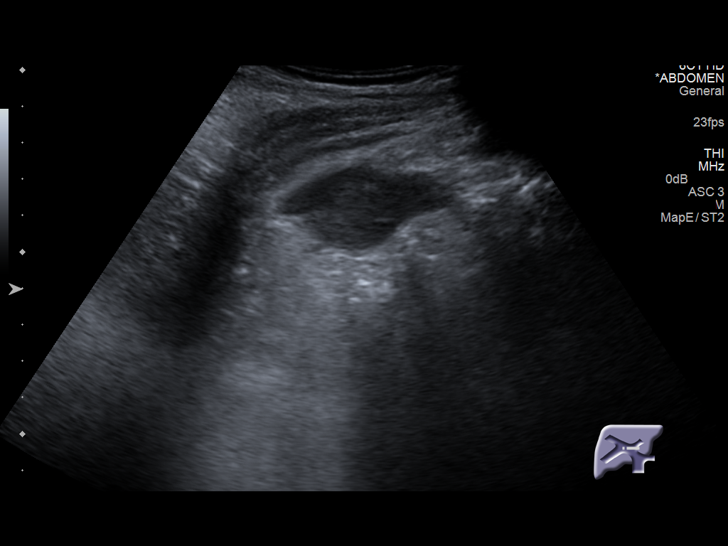
[im 30/40]
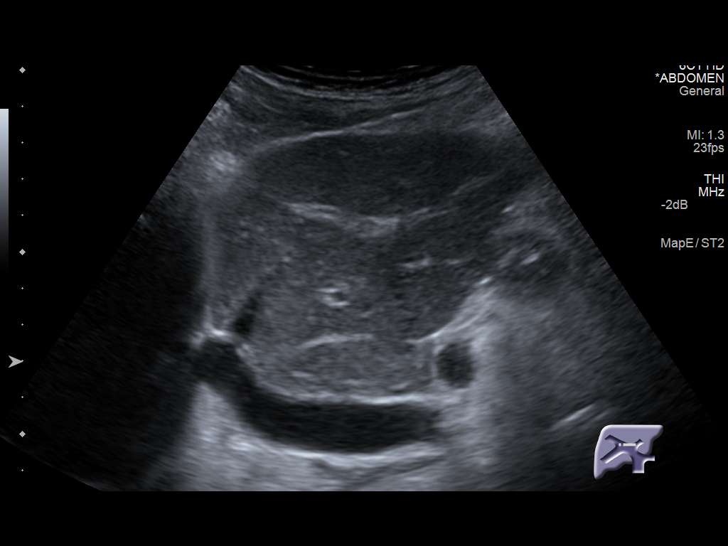
[im 33/40]
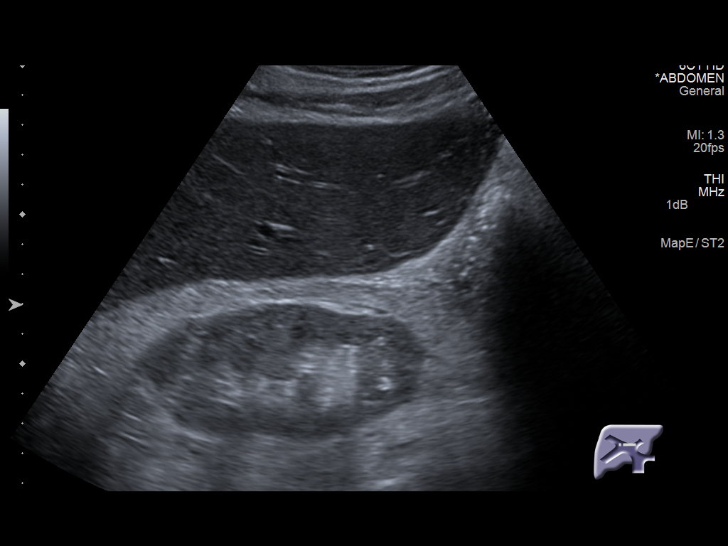
[im 36/40]
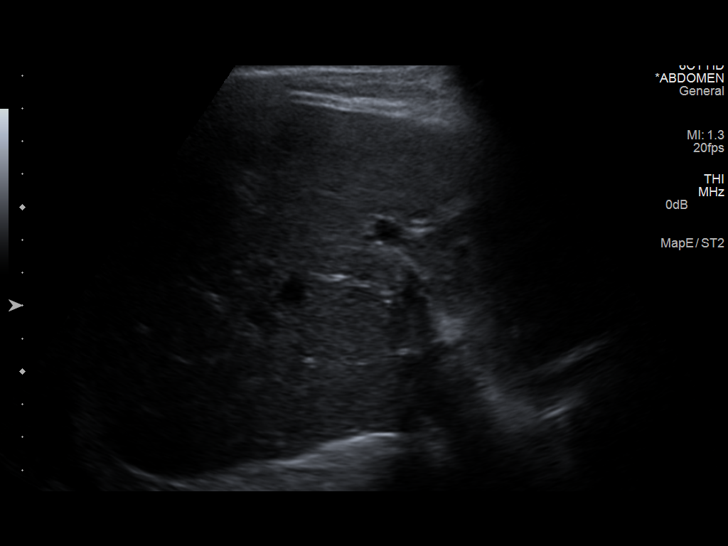
[im 40/40]
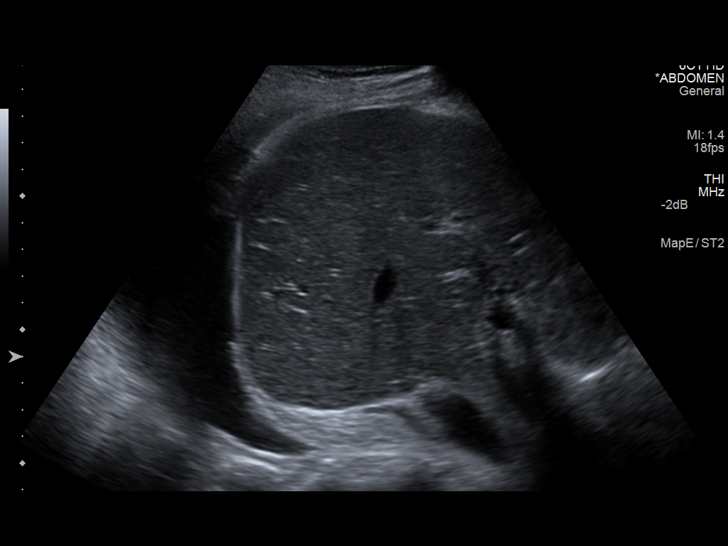

[14 of 25 positions shown; findings below may reference images not displayed]

FINDINGS: Gallbladder:

No gallstones or wall thickening visualized. No sonographic Murphy
sign noted. Gallbladder wall thickness is within normal limits a
mm.

Common bile duct:

Diameter: 2.0 mm, within normal limits

Liver:

The right lobe is somewhat prominent. Finding suggests cirrhosis.
Focal lesions are present.

A right pleural effusion is again noted. Trace abdominal ascites is
also evident.
IMPRESSION: 1. Suggestion of hepatic cirrhosis.
2. Right pleural effusion.
3. Trace abdominal ascites.
4. The gallbladder and common bile duct are within normal limits.
